# Patient Record
Sex: Female | Born: 1935 | Race: White | Hispanic: No | Smoking: Never smoker
Health system: Southern US, Community
[De-identification: ages and names within clinical notes are randomized; demographics above are authoritative.]

## PROBLEM LIST (undated history)

## (undated) DIAGNOSIS — I10 Essential (primary) hypertension: Secondary | ICD-10-CM

## (undated) DIAGNOSIS — C649 Malignant neoplasm of unspecified kidney, except renal pelvis: Secondary | ICD-10-CM

---

## 2020-05-11 ENCOUNTER — Ambulatory Visit: Payer: Medicaid Other | Attending: Family Medicine

## 2020-05-11 ENCOUNTER — Other Ambulatory Visit: Payer: Self-pay

## 2020-05-11 DIAGNOSIS — M25552 Pain in left hip: Secondary | ICD-10-CM | POA: Diagnosis present

## 2020-05-11 DIAGNOSIS — M25511 Pain in right shoulder: Secondary | ICD-10-CM | POA: Diagnosis present

## 2020-05-11 DIAGNOSIS — G8929 Other chronic pain: Secondary | ICD-10-CM | POA: Insufficient documentation

## 2020-05-11 DIAGNOSIS — M75101 Unspecified rotator cuff tear or rupture of right shoulder, not specified as traumatic: Secondary | ICD-10-CM | POA: Insufficient documentation

## 2020-05-13 NOTE — Therapy (Addendum)
[place], [place], [address] Phone: [phone]   Fax:  [phone]  Physical Therapy Evaluation  Patient Details  Name: Gail Fitzgerald MRN: [id number] Date of Birth: 1935/04/24 Referring Provider (PT): Gentry Fitz, MD   Encounter Date: 05/11/2020   PT End of Session - 05/12/20 2044    Visit Number 1    Number of Visits 4    Date for PT Re-Evaluation 06/30/20    Authorization Type [place] - Visit Number 0    Authorization - Number of Visits 3    PT Start Time 5456    PT Stop Time 1541    PT Time Calculation (min) 46 min    Activity Tolerance Patient tolerated treatment well    Behavior During Therapy [facility] for tasks assessed/performed           History reviewed. No pertinent past medical history.  History reviewed. No pertinent surgical history.  There were no vitals filed for this visit.    Subjective Assessment - 05/13/20 0832    Subjective Pt reports a 3 year Hx of R shoulder pain which has recently wrosened. Pt reports taking pain medication from [place] for her R shoulder which is helpful. Pt requested today's eval be for her R shoulder.    Limitations House hold activities;Lifting;Other (comment)   Reaching above shoulder level   How long can you sit comfortably? no issue    How long can you stand comfortably? no issue    How long can you walk comfortably? no issue    Patient Stated Goals To have less pain and use my R arm better with activites above shoulder height and gardening    Pain Onset Other (comment)   3 years ago             [facility] PT Assessment - 05/13/20 0001      Assessment   Medical Diagnosis R Rotator cuff syndrome with probable tear, L lateral hip pain     Referring Provider (PT) Gentry Fitz, MD    Onset Date/Surgical Date --   3 years, wrose recently   Hand Dominance Right    Prior Therapy No      Precautions    Precautions None      Restrictions   Weight Bearing Restrictions No      Balance Screen   Has the patient fallen in the past 6 months No    Has the patient had a decrease in activity level because of a fear of falling?  No    Is the patient reluctant to leave their home because of a fear of falling?  No      Home Environment   Living Environment Private residence    Living Arrangements Alone    Type of [place] to enter    Entrance Stairs-Number of Steps 40    Entrance Stairs-Rails Can reach both    [place] One level      Prior Function   Level of [place] Retired      Observation/Other Assessments   Focus on Therapeutic Outcomes (FOTO)  NA      Sensation   Light Touch Appears Intact      Posture/Postural Control   Posture/Postural Control Postural limitations    Postural Limitations Forward head;Rounded Shoulders      ROM / Strength   AROM /  PROM / Strength AROM;Strength      AROM   Overall AROM Comments R shoulder AROM is WNLs with pain at the end range of  flexion and abd      Strength   Overall Strength Comments R shoulder strength = 4 to 4+/5 c min increase in pain c ER and abd      Special Tests    Special Tests Rotator Cuff Impingement;Biceps/Labral Tests    Rotator Cuff Impingment tests Michel Bickers test;Empty Can test;Full Can test    Biceps/Labral tests Speeds Test      Hawkins-Kennedy test   Findings Negative    Side Right      Empty Can test   Findings Positive    Side Right    Comment Pain c min weakness      Full Can test   Findings Positive    Side Right    Comment pain s weakness      Clunk Test   Findings Negative    Side Right      Speeds test   findings Negative    Side Right      Transfers   Transfers Sit to Stand    Sit to Stand 7: Independent      Ambulation/Gait   Ambulation/Gait Yes    Ambulation/Gait Assistance 7: Independent    Gait Pattern Within  Functional Limits;Step-through pattern                      Objective measurements completed on examination: See above findings.               PT Education - 05/12/20 2038    Education Details Eval findings, POC, HEP, measures for pain reduction, sleep positions for comfort    Person(s) Educated Patient;Child(ren)    Methods Explanation;Demonstration;Tactile cues;Verbal cues;Handout    Comprehension Verbalized understanding;Returned demonstration;Verbal cues required;Tactile cues required;Need further instruction            PT Short Term Goals - 05/13/20 0817      PT SHORT TERM GOAL #1   Title Pt will be Ind in an initial HEP    Baseline Started on eval    Time 3    Period Weeks    Status New    Target Date 06/03/20      PT SHORT TERM GOAL #2   Title Pt will voice understanding of measures to assist in R shoulder pain redution and management    Time 3    Period Weeks    Status New    Target Date 06/03/20             PT Long Term Goals - 05/13/20 0821      PT LONG TERM GOAL #1   Title Pt will be Ind in a final HEP    Time 7    Period Weeks    Status New    Target Date 07/01/20      PT LONG TERM GOAL #2   Title Per MMT pt will demonstrate 4+/5 strength of the R shoulder    Baseline 4 to 4+/5    Time 7    Period Weeks    Status New    Target Date 07/01/20      PT LONG TERM GOAL #3   Title Pt will report completion of household actiities and/or gardening with use of the R UE with pain level not exceeding 3/10    Baseline 8/10  Time 7    Period Weeks    Status New    Target Date 07/01/20                  Plan - 05/13/20 0800    Clinical Impression Statement Pt presents with signs and symptoms of a R shoulder rotator cuff issue. Today, pain nor weakness were markedly reproduced and pt has good functional AROM. Pt will benefit from PT to reduce pain and increase strength of the R shoulder girdle complex to improve functional  use of the R UE. PT will assess L hip pain as indicated.    Personal Factors and Comorbidities Age;Time since onset of injury/illness/exacerbation    Examination-Activity Limitations Carry;Dressing;Lift;Sleep;Reach Overhead    Stability/Clinical Decision Making Stable/Uncomplicated    Clinical Decision Making Low    Rehab Potential Good    PT Frequency 2x / week    PT Duration 6 weeks    PT Treatment/Interventions Cryotherapy;Electrical Stimulation;Ultrasound;Moist Heat;Iontophoresis 4mg /ml Dexamethasone;Gait training;Functional mobility training;Stair training;Therapeutic activities;Therapeutic exercise;Balance training;Manual techniques;Patient/family education;Dry needling;Taping;Vasopneumatic Device    PT Next Visit Plan Assess response to HEP. Complete assessment for L hip pain as indicated    PT Home Exercise Plan [id number]    Consulted and Agree with Plan of Care Patient           Patient will benefit from skilled therapeutic intervention in order to improve the following deficits and impairments:  Decreased activity tolerance, Decreased knowledge of precautions, Decreased strength, Postural dysfunction, Pain  Visit Diagnosis: Rotator cuff syndrome of right shoulder  Chronic right shoulder pain  Lateral pain of left hip     Problem List There are no problems to display for this patient.   Gar Ponto MS, PT 05/13/20 8:34 AM  [facility] [address] [place], [place], [address] Phone: [phone]   Fax:  [phone]  Name: Gail Fitzgerald MRN: [id number] Date of Birth: 09/02/1935   Check all possible CPT codes:      [x]  97110 (Therapeutic Exercise)  []  92507 (SLP Treatment)  [x]  97112 (Neuro Re-ed)   []  92526 (Swallowing Treatment)   [x]  97116 (Gait Training)   []  [phone] (Cognitive Training, 1st 15 minutes) [x]  97140 (Manual Therapy)   []  97130 (Cognitive Training, each add'l 15 minutes)  [x]  97530 (Therapeutic  Activities)  []  Other, List CPT Code ____________    [x]  97535 (Self Care)       []  All codes above (97110 - 97535)  []  97012 (Mechanical Traction)  [x]  97014 (E-stim Unattended)  [x]  97032 (E-stim manual)  [x]  97033 (Ionto)  [x]  97035 (Ultrasound)  [x]  97016 (Vaso)  []  97760 (Orthotic Fit) []  [id number] (Prosthetic Training) []  [id number] (Physical Performance Training) []  [id number] (Aquatic Therapy) []  [id number] (Canalith Repositioning) []  [id number] (Contrast Bath) []  [id number] (Paraffin) []  97597 (Wound Care 1st 20 sq cm) []  97598 (Wound Care each add'l 20 sq cm)

## 2020-05-16 ENCOUNTER — Ambulatory Visit: Payer: Medicaid Other

## 2020-05-16 ENCOUNTER — Other Ambulatory Visit: Payer: Self-pay

## 2020-05-16 DIAGNOSIS — M25511 Pain in right shoulder: Secondary | ICD-10-CM

## 2020-05-16 DIAGNOSIS — M75101 Unspecified rotator cuff tear or rupture of right shoulder, not specified as traumatic: Secondary | ICD-10-CM | POA: Diagnosis not present

## 2020-05-16 DIAGNOSIS — M25552 Pain in left hip: Secondary | ICD-10-CM

## 2020-05-16 DIAGNOSIS — G8929 Other chronic pain: Secondary | ICD-10-CM

## 2020-05-16 NOTE — Therapy (Signed)
[place], [place], [address] Phone: [phone]   Fax:  [phone]  Physical Therapy Treatment  Patient Details  Name: Gail Fitzgerald MRN: [id number] Date of Birth: [birth year] Referring Provider (PT): Gentry Fitz, MD   Encounter Date: 05/16/2020   PT End of Session - 05/16/20 1327    Visit Number 2    Number of Visits 4    Date for PT Re-Evaluation 06/30/20    Authorization Type [place] - Visit Number 1    Authorization - Number of Visits 3    PT Start Time 1218    PT Stop Time 1303    PT Time Calculation (min) 45 min    Activity Tolerance Patient tolerated treatment well    Behavior During Therapy [facility] for tasks assessed/performed           History reviewed. No pertinent past medical history.  History reviewed. No pertinent surgical history.  There were no vitals filed for this visit.   Subjective Assessment - 05/16/20 1225    Subjective Pt reports no R shoulder today. Pt rates a 5/10 level of pain yesterday, but is not aure what caused the increase.    Currently in Pain? No/denies    Pain Location Shoulder    Pain Orientation Right;Lateral    Pain Descriptors / Indicators Aching    Pain Type Chronic pain    Pain Onset Other (comment)   3 years ago   Pain Frequency Intermittent                             [facility] Adult PT Treatment/Exercise - 05/16/20 0001      Exercises   Exercises Shoulder      Shoulder Exercises: Seated   Retraction AROM;Strengthening;Both;10 reps    Other Seated Exercises Seated anchored R GH jt distraction c trunk lean; 3x; 15 sce      Shoulder Exercises: Standing   Extension Strengthening;15 reps    Theraband Level (Shoulder Extension) Level 3 (Green)    Extension Limitations 2 sets    Retraction Strengthening;Both;15 reps    Theraband Level (Shoulder Retraction) Level 3 (Green)    Retraction  Limitations 2 sets      Shoulder Exercises: ROM/Strengthening   Pendulum r UE; 15x                  PT Education - 05/16/20 1326    Education Details HEP    Person(s) Educated Patient    Methods Explanation;Demonstration;Tactile cues;Verbal cues;Handout    Comprehension Verbalized understanding;Returned demonstration;Verbal cues required;Tactile cues required;Need further instruction            PT Short Term Goals - 05/13/20 0817      PT SHORT TERM GOAL #1   Title Pt will be Ind in an initial HEP    Baseline Started on eval    Time 3    Period Weeks    Status New    Target Date 06/03/20      PT SHORT TERM GOAL #2   Title Pt will voice understanding of measures to assist in R shoulder pain redution and management    Time 3    Period Weeks    Status New    Target Date 06/03/20             PT Long Term Goals - 05/13/20 6962  PT LONG TERM GOAL #1   Title Pt will be Ind in a final HEP    Time 7    Period Weeks    Status New    Target Date 07/01/20      PT LONG TERM GOAL #2   Title Per MMT pt will demonstrate 4+/5 strength of the R shoulder    Baseline 4 to 4+/5    Time 7    Period Weeks    Status New    Target Date 07/01/20      PT LONG TERM GOAL #3   Title Pt will report completion of household actiities and/or gardening with use of the R UE with pain level not exceeding 3/10    Baseline 8/10    Time 7    Period Weeks    Status New    Target Date 07/01/20                 Plan - 05/16/20 1329    Clinical Impression Statement Reveiwed HEP started on eval. Corrections in technique were made, and pt is completing properly. Exs for post. scapular chain strengthening were added to the pt's HEP. Initial HEP has been established.    Personal Factors and Comorbidities Age    Examination-Activity Limitations Carry;Dressing;Lift;Sleep;Reach Overhead    Stability/Clinical Decision Making Stable/Uncomplicated    Clinical Decision Making Low     Rehab Potential Good    PT Frequency 2x / week    PT Duration 6 weeks    PT Treatment/Interventions Cryotherapy;Electrical Stimulation;Ultrasound;Moist Heat;Iontophoresis 4mg /ml Dexamethasone;Gait training;Functional mobility training;Stair training;Therapeutic activities;Therapeutic exercise;Balance training;Manual techniques;Patient/family education;Dry needling;Taping;Vasopneumatic Device    PT Next Visit Plan Assess response to HEP. Complete assessment for L hip pain as indicated. Complete iontophoresis to the R shoulder as indicated.    PT Home Exercise Plan [id number]. Scapular retractions, pendulum, Sitting [place] jt distraction, scapular retraction with Tband, shoulder extension c IR with Tband.    Consulted and Agree with Plan of Care Patient;Family member/caregiver    Family Member Consulted DIL           Patient will benefit from skilled therapeutic intervention in order to improve the following deficits and impairments:  Decreased activity tolerance, Decreased knowledge of precautions, Decreased strength, Postural dysfunction, Pain  Visit Diagnosis: Rotator cuff syndrome of right shoulder  Chronic right shoulder pain  Lateral pain of left hip     Problem List There are no problems to display for this patient.  Gar Ponto MS, PT 05/16/20 1:44 PM   [facility] [address] [place], [place], [address] Phone: [phone]   Fax:  [phone]  Name: Gail Fitzgerald MRN: [id number] Date of Birth: [birth year]

## 2020-05-18 ENCOUNTER — Ambulatory Visit: Payer: Medicaid Other

## 2020-05-19 ENCOUNTER — Ambulatory Visit: Payer: Medicaid Other | Admitting: Physical Therapy

## 2020-05-19 ENCOUNTER — Other Ambulatory Visit: Payer: Self-pay

## 2020-05-19 ENCOUNTER — Encounter: Payer: Self-pay | Admitting: Physical Therapy

## 2020-05-19 DIAGNOSIS — M75101 Unspecified rotator cuff tear or rupture of right shoulder, not specified as traumatic: Secondary | ICD-10-CM | POA: Diagnosis not present

## 2020-05-19 DIAGNOSIS — G8929 Other chronic pain: Secondary | ICD-10-CM

## 2020-05-19 DIAGNOSIS — M25552 Pain in left hip: Secondary | ICD-10-CM

## 2020-05-19 NOTE — Therapy (Signed)
[place], [place], [address] Phone: [phone]   Fax:  [phone]  Physical Therapy Treatment  Patient Details  Name: Gail Fitzgerald MRN: [id number] Date of Birth: Feb 17, 1935 Referring Provider (PT): Gentry Fitz, MD   Encounter Date: 05/19/2020   PT End of Session - 05/19/20 1035    Visit Number 3    Number of Visits 4    Date for PT Re-Evaluation 06/30/20    Authorization Type [place] - Visit Number 2    Authorization - Number of Visits 3    PT Start Time [phone]    PT Stop Time 1015    PT Time Calculation (min) 44 min    Activity Tolerance Patient tolerated treatment well    Behavior During Therapy [facility] for tasks assessed/performed           History reviewed. No pertinent past medical history.  History reviewed. No pertinent surgical history.  There were no vitals filed for this visit.   Subjective Assessment - 05/19/20 1029    Subjective Some soreness with doing exercises (shoulder) and also noting some right upper trapezius region discomfort when raising right arm. Pt. wishes to continue focus on shoulder (rather than hip).    Patient is accompained by: Family member   granddaughter   Patient Stated Goals To have less pain and use my R arm better with activites above shoulder height and gardening                             [facility] Adult PT Treatment/Exercise - 05/19/20 0001      Shoulder Exercises: Supine   Horizontal ABduction AROM;Strengthening;Both;20 reps    Theraband Level (Shoulder Horizontal ABduction) Level 2 (Red)    Other Supine Exercises DB "punch" 2x10-started with 2 lbs. but too light per pt. so increased to 4 lbs.    Other Supine Exercises supine rhythmic stabilization at 90 deg flexion 20 sec x 3      Shoulder Exercises: Sidelying   External Rotation AROM;Strengthening;Right;15 reps    External Rotation Weight  (lbs) 1    ABduction AROM;Strengthening;Right;20 reps    ABduction Weight (lbs) 1    ABduction Limitations plane of scaption, cues for hand position and angle of ROM      Shoulder Exercises: Standing   Internal Rotation AROM;Strengthening;Right;15 reps    Theraband Level (Shoulder Internal Rotation) Level 3 (Green)    Flexion AROM;Strengthening;Right;20 reps    Shoulder Flexion Weight (lbs) 2    Flexion Limitations 2x10 flexion to 90 deg    Extension AROM;Strengthening;Both;20 reps    Theraband Level (Shoulder Extension) Level 3 (Green)    Retraction AROM;Strengthening;Both;20 reps    Theraband Level (Shoulder Retraction) Level 3 (Green)    Other Standing Exercises Theraband "punch" red band 2x10      Shoulder Exercises: Pulleys   Flexion 1 minute      Shoulder Exercises: Stretch   Other Shoulder Stretches seated right upper trapezius stretch 2 x 30 seconds      Manual Therapy   Manual Therapy Soft tissue mobilization    Soft tissue mobilization Trigger point STM right upper trapezius                  PT Education - 05/19/20 1034    Education Details exercises, body mechanics-if raising arm from side discussed/demo plane of scaption rather than abduction to  minimize impingement    Person(s) Educated Patient   granddaughter   Methods Verbal cues;Explanation;Demonstration    Comprehension Verbalized understanding;Returned demonstration            PT Short Term Goals - 05/13/20 0817      PT SHORT TERM GOAL #1   Title Pt will be Ind in an initial HEP    Baseline Started on eval    Time 3    Period Weeks    Status New    Target Date 06/03/20      PT SHORT TERM GOAL #2   Title Pt will voice understanding of measures to assist in R shoulder pain redution and management    Time 3    Period Weeks    Status New    Target Date 06/03/20             PT Long Term Goals - 05/13/20 0821      PT LONG TERM GOAL #1   Title Pt will be Ind in a final HEP    Time 7     Period Weeks    Status New    Target Date 07/01/20      PT LONG TERM GOAL #2   Title Per MMT pt will demonstrate 4+/5 strength of the R shoulder    Baseline 4 to 4+/5    Time 7    Period Weeks    Status New    Target Date 07/01/20      PT LONG TERM GOAL #3   Title Pt will report completion of household actiities and/or gardening with use of the R UE with pain level not exceeding 3/10    Baseline 8/10    Time 7    Period Weeks    Status New    Target Date 07/01/20                 Plan - 05/19/20 1036    Clinical Impression Statement Tx. session focus for continued right shoulder strengthening-still with pain in abduction but tolerated as noted per flowsheet with emphasis on scaption plane-overall exercise progression was well-tolerated. Discussed ionto but pt. declined so more focus exercises and also included brief manual to right upper trapezius region (suspect tightness/discomfort this area associated with compensatory use with shoulder motion). Pt. would benefit from continued therapy for further progression to improve functional status for reaching ability with RUE and address associated functional limitations.    Personal Factors and Comorbidities Age    Examination-Activity Limitations Carry;Dressing;Lift;Sleep;Reach Overhead    Stability/Clinical Decision Making Stable/Uncomplicated    Clinical Decision Making Low    Rehab Potential Good    PT Frequency 2x / week    PT Duration 6 weeks    PT Treatment/Interventions Cryotherapy;Electrical Stimulation;Ultrasound;Moist Heat;Iontophoresis 4mg /ml Dexamethasone;Gait training;Functional mobility training;Stair training;Therapeutic activities;Therapeutic exercise;Balance training;Manual techniques;Patient/family education;Dry needling;Taping;Vasopneumatic Device    PT Next Visit Plan shoulder vs. hip focus pending pt. preference, continue emphasis shoulder strengthening and stabilization as tolerated, update/progess HEP as  needed    PT Home Exercise Plan [id number]. Scapular retractions, pendulum, Sitting [place] jt distraction, scapular retraction with Tband, shoulder extension c IR with Tband.    Consulted and Agree with Plan of Care Patient;Family member/caregiver           Patient will benefit from skilled therapeutic intervention in order to improve the following deficits and impairments:  Decreased activity tolerance, Decreased knowledge of precautions, Decreased strength, Postural dysfunction, Pain  Visit Diagnosis: Rotator cuff syndrome of  right shoulder  Chronic right shoulder pain  Lateral pain of left hip     Problem List There are no problems to display for this patient.   [name], PT, DPT 05/19/20 10:40 AM  [facility] [address] [place], [place], [address] Phone: [phone]   Fax:  [phone]  Name: Gail Fitzgerald MRN: [id number] Date of Birth: [birth year]

## 2020-05-24 ENCOUNTER — Ambulatory Visit: Payer: Medicaid Other | Admitting: Physical Therapy

## 2020-05-24 ENCOUNTER — Encounter: Payer: Self-pay | Admitting: Physical Therapy

## 2020-05-24 ENCOUNTER — Other Ambulatory Visit: Payer: Self-pay

## 2020-05-24 DIAGNOSIS — M25552 Pain in left hip: Secondary | ICD-10-CM

## 2020-05-24 DIAGNOSIS — G8929 Other chronic pain: Secondary | ICD-10-CM

## 2020-05-24 DIAGNOSIS — M75101 Unspecified rotator cuff tear or rupture of right shoulder, not specified as traumatic: Secondary | ICD-10-CM | POA: Diagnosis not present

## 2020-05-24 NOTE — Therapy (Signed)
[place], [place], [address] Phone: [phone]   Fax:  [phone]  Physical Therapy Treatment  Patient Details  Name: Gail Fitzgerald MRN: [id number] Date of Birth: 01/30/1935 Referring Provider (PT): Gentry Fitz, MD   Encounter Date: 05/24/2020   PT End of Session - 05/24/20 1715    Visit Number 4    Number of Visits 13    Date for PT Re-Evaluation 06/30/20    Authorization Type [place] MEDICAID HEALTHY BLUE    Authorization Time Period 05/16/20-07/04/20    Authorization - Visit Number 3    Authorization - Number of Visits 12    PT Start Time 4268    PT Stop Time 1500    PT Time Calculation (min) 47 min    Activity Tolerance Patient tolerated treatment well    Behavior During Therapy [facility] for tasks assessed/performed           History reviewed. No pertinent past medical history.  History reviewed. No pertinent surgical history.  There were no vitals filed for this visit.   Subjective Assessment - 05/24/20 1414    Subjective Shoulder feels a little better since last visit. Shoulder pain is intermittent/does not hurt all the time but can still increase as high as 8-9/10 briefly with activity. Pt. wishes to address left hip region pain today as well.    Patient is accompained by: Family member   daughter-in-law   Limitations House hold activities;Lifting;Other (comment)    Patient Stated Goals To have less pain and use my R arm better with activites above shoulder height and gardening    Currently in Pain? No/denies              [facility] PT Assessment - 05/24/20 0001      ROM / Strength   AROM / PROM / Strength AROM      AROM   AROM Assessment Site Shoulder    Right/Left Shoulder Right    Right Shoulder Flexion 160 Degrees    Right Shoulder ABduction 150 Degrees    Right Shoulder Internal Rotation --   reach to T9   Right Shoulder External Rotation --   reach to T2     Strength    Strength Assessment Site Shoulder;Hip    Right/Left Shoulder Right    Right Shoulder Flexion 5/5    Right Shoulder ABduction 4+/5    Right Shoulder Internal Rotation 5/5    Right Shoulder External Rotation 4+/5    Right/Left Hip Left    Right Hip Flexion 4+/5    Right Hip Extension 4+/5    Right Hip External Rotation  4+/5    Right Hip Internal Rotation 4+/5    Right Hip ABduction 4+/5    Right Hip ADduction 5/5      Palpation   Palpation comment tender to palpation left greater trochanteri and gluteus medius region                         [facility] Adult PT Treatment/Exercise - 05/24/20 0001      Exercises   Exercises Knee/Hip      Knee/Hip Exercises: Stretches   ITB Stretch Left;3 reps;30 seconds    Piriformis Stretch Left;3 reps;30 seconds    Other Knee/Hip Stretches Brief HEP instruction standing TFL stretch      Knee/Hip Exercises: Standing   Hip Abduction AROM;Stengthening;Left;2 sets;10 reps;Knee straight    Abduction Limitations Green Theraband proximal to  knees      Knee/Hip Exercises: Supine   Other Supine Knee/Hip Exercises clamshell green band 2x10      Shoulder Exercises: Supine   Horizontal ABduction AROM;Strengthening;Both;20 reps    Theraband Level (Shoulder Horizontal ABduction) Level 3 (Green)    Other Supine Exercises DB "punch". 2x10    Other Supine Exercises supine rhythmic stabilization at 90 deg flexion 20 sec x 3      Shoulder Exercises: Sidelying   External Rotation AROM;Strengthening;Right;20 reps    External Rotation Weight (lbs) 2    ABduction AROM;Strengthening;Right;20 reps    ABduction Limitations plane of scaption, cues for hand position and angle of ROM      Shoulder Exercises: Standing   Internal Rotation AROM;Strengthening;Right;20 reps    Theraband Level (Shoulder Internal Rotation) Level 3 (Green)    Flexion AROM;Strengthening;Right;20 reps    Shoulder Flexion Weight (lbs) 3    Flexion Limitations 2x10 flexion to 90  deg    Extension AROM;Strengthening;Right;20 reps    Theraband Level (Shoulder Extension) Level 3 (Green)    Retraction AROM;Strengthening;Both;20 reps    Theraband Level (Shoulder Retraction) Level 4 (Blue)    Other Standing Exercises Theraband "punch" green band 2x10      Manual Therapy   Soft tissue mobilization STM/IASTM with roller left gluteus medius, TFL and piriformis                  PT Education - 05/24/20 1530    Education Details HEP updates, exercises    Person(s) Educated Patient;Other (comment)   daughter in law   Methods Explanation;Demonstration;Tactile cues;Verbal cues;Handout    Comprehension Returned demonstration;Verbalized understanding            PT Short Term Goals - 05/13/20 0817      PT SHORT TERM GOAL #1   Title Pt will be Ind in an initial HEP    Baseline Started on eval    Time 3    Period Weeks    Status New    Target Date 06/03/20      PT SHORT TERM GOAL #2   Title Pt will voice understanding of measures to assist in R shoulder pain redution and management    Time 3    Period Weeks    Status New    Target Date 06/03/20             PT Long Term Goals - 05/13/20 0821      PT LONG TERM GOAL #1   Title Pt will be Ind in a final HEP    Time 7    Period Weeks    Status New    Target Date 07/01/20      PT LONG TERM GOAL #2   Title Per MMT pt will demonstrate 4+/5 strength of the R shoulder    Baseline 4 to 4+/5    Time 7    Period Weeks    Status New    Target Date 07/01/20      PT LONG TERM GOAL #3   Title Pt will report completion of household actiities and/or gardening with use of the R UE with pain level not exceeding 3/10    Baseline 8/10    Time 7    Period Weeks    Status New    Target Date 07/01/20                 Plan - 05/24/20 1454    Clinical Impression Statement Shoulder: Pt.  is progressing well from baseline status with improving right shoulder strength-still with some pain in abduction  consistent with lilekly impingement/potential underlying cuff tear but improving with less pain from baseline status. Hip: findings consistent with trochanteric bursitis vs. abductor tendinopathy/contributing glut med region myofascial pain with some associated hip weakness in abduction and hip rotator muscle tightness. Pt. would benefit from continued therapy for further progress to address remaining fnuctional limitations for both regions.    Personal Factors and Comorbidities Age    Examination-Activity Limitations Carry;Dressing;Lift;Sleep;Reach Overhead    Stability/Clinical Decision Making Stable/Uncomplicated    Clinical Decision Making Low    Rehab Potential Good    PT Frequency 2x / week    PT Duration 6 weeks    PT Treatment/Interventions Cryotherapy;Electrical Stimulation;Ultrasound;Moist Heat;Iontophoresis 4mg /ml Dexamethasone;Gait training;Functional mobility training;Stair training;Therapeutic activities;Therapeutic exercise;Balance training;Manual techniques;Patient/family education;Dry needling;Taping;Vasopneumatic Device    PT Next Visit Plan Continue shoulder strengthening progression as tolerated, hp abductor strengthening/stretches/manual for hip, modalities prn    PT Home Exercise Plan [id number]. Scapular retractions, pendulum, Sitting [place] jt distraction, scapular retraction with Tband, shoulder extension c IR with Tband, TFL stretch in standing, hip abd SLR in standing with Theraband, clamshell    Consulted and Agree with Plan of Care Patient;Family member/caregiver    Family Member Consulted DIL           Patient will benefit from skilled therapeutic intervention in order to improve the following deficits and impairments:  Decreased activity tolerance, Decreased knowledge of precautions, Decreased strength, Postural dysfunction, Pain  Visit Diagnosis: Rotator cuff syndrome of right shoulder  Chronic right shoulder pain  Lateral pain of left hip     Problem  List There are no problems to display for this patient.   [name], PT, DPT 05/24/20 5:16 PM  [facility] [address] [place], [place], [address] Phone: [phone]   Fax:  [phone]  Name: Gail Fitzgerald MRN: [id number] Date of Birth: 1935-07-23

## 2020-05-26 ENCOUNTER — Ambulatory Visit: Payer: Medicaid Other

## 2020-05-26 ENCOUNTER — Other Ambulatory Visit: Payer: Self-pay

## 2020-05-26 DIAGNOSIS — M75101 Unspecified rotator cuff tear or rupture of right shoulder, not specified as traumatic: Secondary | ICD-10-CM

## 2020-05-26 DIAGNOSIS — G8929 Other chronic pain: Secondary | ICD-10-CM

## 2020-05-26 DIAGNOSIS — M25552 Pain in left hip: Secondary | ICD-10-CM

## 2020-05-27 NOTE — Therapy (Signed)
[place], [place], [address] Phone: [phone]   Fax:  [phone]  Physical Therapy Treatment  Patient Details  Name: Gail Fitzgerald MRN: [id number] Date of Birth: [birth year] Referring Provider (PT): Gentry Fitz, MD   Encounter Date: 05/26/2020   PT End of Session - 05/27/20 1227    Visit Number 5    Date for PT Re-Evaluation 06/30/20    Authorization Type [place] MEDICAID HEALTHY BLUE    Authorization Time Period 05/16/20-07/04/20    Authorization - Visit Number 4    Authorization - Number of Visits 12    PT Start Time 5427    PT Stop Time 0918    PT Time Calculation (min) 41 min    Activity Tolerance Patient tolerated treatment well    Behavior During Therapy [facility] for tasks assessed/performed           History reviewed. No pertinent past medical history.  History reviewed. No pertinent surgical history.  There were no vitals filed for this visit.   Subjective Assessment - 05/26/20 0844    Subjective Pt reports her R shoulder and her L hip are not hurting. Pt states the L hip bothers when walking and when she sleeps on it.    Patient is accompained by: Family member    Currently in Pain? No/denies    Pain Score 0-No pain    Pain Location Abdomen    Pain Orientation Right    Pain Descriptors / Indicators Aching    Pain Type Chronic pain    Pain Onset Other (comment)   3 years ago                            [facility] Adult PT Treatment/Exercise - 05/27/20 0001      Exercises   Exercises Knee/Hip      Knee/Hip Exercises: Stretches   ITB Stretch Left;3 reps;30 seconds    Piriformis Stretch Left;3 reps;30 seconds    Piriformis Stretch Limitations Seated      Knee/Hip Exercises: Standing   Hip Abduction AROM;Stengthening;2 sets;10 reps;Knee straight;Right;Left    Abduction Limitations Green Theraband proximal to knees      Knee/Hip Exercises: Supine   Hip Adduction  Isometric Strengthening;Both;2 sets;10 reps    Hip Adduction Isometric Limitations Ball squeeze    Other Supine Knee/Hip Exercises clamshell green band 2x10                  PT Education - 05/27/20 1226    Education Details HEP. Review of hip flexibilty and strengthening exs    Person(s) Educated Patient;Other (comment)   DIL   Methods Explanation;Demonstration;Tactile cues;Verbal cues;Handout    Comprehension Verbalized understanding;Returned demonstration;Verbal cues required;Tactile cues required;Need further instruction            PT Short Term Goals - 05/13/20 0817      PT SHORT TERM GOAL #1   Title Pt will be Ind in an initial HEP    Baseline Started on eval    Time 3    Period Weeks    Status New    Target Date 06/03/20      PT SHORT TERM GOAL #2   Title Pt will voice understanding of measures to assist in R shoulder pain redution and management    Time 3    Period Weeks    Status New    Target Date 06/03/20  PT Long Term Goals - 05/13/20 1610      PT LONG TERM GOAL #1   Title Pt will be Ind in a final HEP    Time 7    Period Weeks    Status New    Target Date 07/01/20      PT LONG TERM GOAL #2   Title Per MMT pt will demonstrate 4+/5 strength of the R shoulder    Baseline 4 to 4+/5    Time 7    Period Weeks    Status New    Target Date 07/01/20      PT LONG TERM GOAL #3   Title Pt will report completion of household actiities and/or gardening with use of the R UE with pain level not exceeding 3/10    Baseline 8/10    Time 7    Period Weeks    Status New    Target Date 07/01/20                 Plan - 05/27/20 1228    Clinical Impression Statement PT focused on ther ex/HEP for L hip flexibilty and strengthening exs. Pt presents to today's session reporting neither R shoulder or L hip pain.    Personal Factors and Comorbidities Age    Examination-Activity Limitations Carry;Dressing;Lift;Sleep;Reach Overhead     Stability/Clinical Decision Making Stable/Uncomplicated    Clinical Decision Making Low    Rehab Potential Good    PT Frequency 2x / week    PT Duration 6 weeks    PT Treatment/Interventions Cryotherapy;Electrical Stimulation;Ultrasound;Moist Heat;Iontophoresis 4mg /ml Dexamethasone;Gait training;Functional mobility training;Stair training;Therapeutic activities;Therapeutic exercise;Balance training;Manual techniques;Patient/family education;Dry needling;Taping;Vasopneumatic Device    PT Next Visit Plan Assess response to HEP for L hip. Continue shoulder strengthening progression as tolerated, hip abductor strengthening/stretches/manual for hip, modalities prn    PT Home Exercise Plan [id number]. Scapular retractions, pendulum, Sitting [place] jt distraction, scapular retraction with Tband, shoulder extension c IR with Tband, TFL stretch in standing, hip abd SLR in standing with Theraband, clamshell, hip add isometric c ball in supine.    Consulted and Agree with Plan of Care Patient;Family member/caregiver    Family Member Consulted DIL           Patient will benefit from skilled therapeutic intervention in order to improve the following deficits and impairments:  Decreased activity tolerance, Decreased knowledge of precautions, Decreased strength, Postural dysfunction, Pain  Visit Diagnosis: Rotator cuff syndrome of right shoulder  Chronic right shoulder pain  Lateral pain of left hip     Problem List There are no problems to display for this patient.   Gar Ponto MS, PT 05/27/20 12:35 PM  [facility] [address] [place], [place], [address] Phone: [phone]   Fax:  [phone]  Name: Gail Fitzgerald MRN: [id number] Date of Birth: December 03, 1934

## 2020-05-30 ENCOUNTER — Other Ambulatory Visit: Payer: Self-pay

## 2020-05-30 ENCOUNTER — Ambulatory Visit: Payer: Medicaid Other

## 2020-05-30 DIAGNOSIS — M75101 Unspecified rotator cuff tear or rupture of right shoulder, not specified as traumatic: Secondary | ICD-10-CM | POA: Diagnosis not present

## 2020-05-30 DIAGNOSIS — G8929 Other chronic pain: Secondary | ICD-10-CM

## 2020-05-30 DIAGNOSIS — M25552 Pain in left hip: Secondary | ICD-10-CM

## 2020-05-31 NOTE — Therapy (Signed)
[place], [place], [address] Phone: [phone]   Fax:  [phone]  Physical Therapy Treatment  Patient Details  Name: Gail Fitzgerald MRN: [id number] Date of Birth: 1934-11-30 Referring Provider (PT): Gentry Fitz, MD   Encounter Date: 05/30/2020   PT End of Session - 05/31/20 0641    Visit Number 6    Number of Visits 13    Date for PT Re-Evaluation 06/30/20    Authorization Type [place] MEDICAID HEALTHY BLUE    Authorization Time Period 05/16/20-07/04/20    Authorization - Visit Number 5    Authorization - Number of Visits 12    PT Start Time 2585    PT Stop Time 1622    PT Time Calculation (min) 43 min    Activity Tolerance Patient tolerated treatment well    Behavior During Therapy [facility] for tasks assessed/performed           History reviewed. No pertinent past medical history.  History reviewed. No pertinent surgical history.  There were no vitals filed for this visit.   Subjective Assessment - 05/30/20 1544    Subjective Pt reports R shoulder pain only c quick shoulder movements. Pt reports walking 2 miles yesterday and had R hip pain last night at 5/10, but has no pain today.    Patient Stated Goals To have less pain and use my R arm better with activites above shoulder height and gardening    Pain Score 0-No pain    Pain Location Shoulder    Pain Orientation Right    Pain Descriptors / Indicators Aching    Pain Type Chronic pain    Pain Onset Other (comment)   3 years ago   Pain Frequency Intermittent    Aggravating Factors  Over Sh activities    Pain Relieving Factors Rest    Effect of Pain on Daily Activities Low impact                             [facility] Adult PT Treatment/Exercise - 05/31/20 0001      Exercises   Exercises Knee/Hip;Shoulder      Knee/Hip Exercises: Stretches   ITB Stretch Left;3 reps;30 seconds    Piriformis Stretch Left;3 reps;30  seconds    Piriformis Stretch Limitations Seated      Knee/Hip Exercises: Standing   Hip Abduction AROM;Stengthening;2 sets;10 reps;Knee straight;Right;Left    Abduction Limitations Green Theraband proximal to knees      Knee/Hip Exercises: Supine   Hip Adduction Isometric Strengthening;Both;2 sets;10 reps    Hip Adduction Isometric Limitations Ball squeeze      Shoulder Exercises: Supine   Protraction Strengthening;Both;15 reps    Protraction Weight (lbs) 4 lbs    Horizontal ABduction AROM;Strengthening;Both;15 reps    Theraband Level (Shoulder Horizontal ABduction) Level 3 (Green)    External Rotation Strengthening;Both;15 reps    Theraband Level (Shoulder External Rotation) Level 4 (Blue)                    PT Short Term Goals - 05/13/20 0817      PT SHORT TERM GOAL #1   Title Pt will be Ind in an initial HEP    Baseline Started on eval    Time 3    Period Weeks    Status New    Target Date 06/03/20      PT SHORT TERM GOAL #2  Title Pt will voice understanding of measures to assist in R shoulder pain redution and management    Time 3    Period Weeks    Status New    Target Date 06/03/20             PT Long Term Goals - 05/13/20 0821      PT LONG TERM GOAL #1   Title Pt will be Ind in a final HEP    Time 7    Period Weeks    Status New    Target Date 07/01/20      PT LONG TERM GOAL #2   Title Per MMT pt will demonstrate 4+/5 strength of the R shoulder    Baseline 4 to 4+/5    Time 7    Period Weeks    Status New    Target Date 07/01/20      PT LONG TERM GOAL #3   Title Pt will report completion of household actiities and/or gardening with use of the R UE with pain level not exceeding 3/10    Baseline 8/10    Time 7    Period Weeks    Status New    Target Date 07/01/20                 Plan - 05/31/20 3557    Clinical Impression Statement PT focused on R GH and scapular strengthening and flexibility and strengthening of the L  hip. Per pain reports from pt, the intensity of pain and frequency are improving.    Personal Factors and Comorbidities Age    Examination-Activity Limitations Carry;Dressing;Lift;Sleep;Reach Overhead    Stability/Clinical Decision Making Stable/Uncomplicated    Clinical Decision Making Low    Rehab Potential Good    PT Frequency 2x / week    PT Treatment/Interventions Cryotherapy;Electrical Stimulation;Ultrasound;Moist Heat;Iontophoresis 4mg /ml Dexamethasone;Gait training;Functional mobility training;Stair training;Therapeutic activities;Therapeutic exercise;Balance training;Manual techniques;Patient/family education;Dry needling;Taping;Vasopneumatic Device    PT Next Visit Plan Assess response to HEP for L hip. Continue shoulder strengthening progression as tolerated, hip abductor strengthening/stretches/manual for hip, modalities prn    PT Home Exercise Plan [id number]. Scapular retractions, pendulum, Sitting [place] jt distraction, scapular retraction with Tband, shoulder extension c IR with Tband, TFL stretch in standing, hip abd SLR in standing with Theraband, clamshell, hip add isometric c ball in supine.    Consulted and Agree with Plan of Care Patient;Family member/caregiver    Family Member Consulted DIL           Patient will benefit from skilled therapeutic intervention in order to improve the following deficits and impairments:  Decreased activity tolerance, Decreased knowledge of precautions, Decreased strength, Postural dysfunction, Pain  Visit Diagnosis: Rotator cuff syndrome of right shoulder  Chronic right shoulder pain  Lateral pain of left hip     Problem List There are no problems to display for this patient.  Gar Ponto MS, PT 05/31/20 6:50 AM  [facility] [address] [place], [place], [address] Phone: [phone]   Fax:  [phone]  Name: Gail Fitzgerald MRN: [id number] Date of Birth: Jun 02, 1935

## 2020-06-01 ENCOUNTER — Other Ambulatory Visit: Payer: Self-pay

## 2020-06-01 ENCOUNTER — Ambulatory Visit: Payer: Medicaid Other

## 2020-06-01 DIAGNOSIS — G8929 Other chronic pain: Secondary | ICD-10-CM

## 2020-06-01 DIAGNOSIS — M25511 Pain in right shoulder: Secondary | ICD-10-CM

## 2020-06-01 DIAGNOSIS — M25552 Pain in left hip: Secondary | ICD-10-CM

## 2020-06-01 DIAGNOSIS — M75101 Unspecified rotator cuff tear or rupture of right shoulder, not specified as traumatic: Secondary | ICD-10-CM

## 2020-06-02 NOTE — Therapy (Signed)
[place], [place], [address] Phone: [phone]   Fax:  [phone]  Physical Therapy Treatment  Patient Details  Name: Gail Fitzgerald MRN: [id number] Date of Birth: 1935-08-30 Referring Provider (PT): Gentry Fitz, MD   Encounter Date: 06/01/2020   PT End of Session - 06/02/20 2016    Visit Number 7    Number of Visits 13    Date for PT Re-Evaluation 06/30/20    Authorization Time Period 05/16/20-07/04/20    Authorization - Visit Number 6    Authorization - Number of Visits 12    PT Start Time 7782    PT Stop Time 1130    PT Time Calculation (min) 42 min    Activity Tolerance Patient tolerated treatment well    Behavior During Therapy [facility] for tasks assessed/performed           History reviewed. No pertinent past medical history.  History reviewed. No pertinent surgical history.  There were no vitals filed for this visit.                      [facility] Adult PT Treatment/Exercise - 06/02/20 0001      Exercises   Exercises Knee/Hip;Shoulder      Knee/Hip Exercises: Supine   Hip Adduction Isometric Strengthening;Both;10 reps    Hip Adduction Isometric Limitations Ball squeeze 3 sec    Bridges with [employer] Strengthening;Both;10 reps    Other Supine Knee/Hip Exercises clamshell green band 2x10      Shoulder Exercises: Supine   Protraction Strengthening;Both;10 reps    Protraction Weight (lbs) 4 lbs      Shoulder Exercises: Seated   Horizontal ABduction Strengthening;Both;10 reps    Theraband Level (Shoulder Horizontal ABduction) Level 3 (Green)    Horizontal ABduction Limitations 2 sets    External Rotation Strengthening;Both;10 reps    Theraband Level (Shoulder External Rotation) Level 3 (Green)    External Rotation Limitations 2 sets      Shoulder Exercises: Sidelying   External Rotation Strengthening;Right;10 reps    External Rotation Weight (lbs) 4     External Rotation Limitations 2 sets                  PT Education - 06/02/20 2014    Education Details pt stated she was playing to take an hour walk this afternood. Advised pt to walk for just a .5 hour and assess how her L hip resonds.    Person(s) Educated Patient    Methods Explanation    Comprehension Verbalized understanding            PT Short Term Goals - 05/13/20 0817      PT SHORT TERM GOAL #1   Title Pt will be Ind in an initial HEP    Baseline Started on eval    Time 3    Period Weeks    Status New    Target Date 06/03/20      PT SHORT TERM GOAL #2   Title Pt will voice understanding of measures to assist in R shoulder pain redution and management    Time 3    Period Weeks    Status New    Target Date 06/03/20             PT Long Term Goals - 05/13/20 0821      PT LONG TERM GOAL #1   Title Pt will be Ind in a final  HEP    Time 7    Period Weeks    Status New    Target Date 07/01/20      PT LONG TERM GOAL #2   Title Per MMT pt will demonstrate 4+/5 strength of the R shoulder    Baseline 4 to 4+/5    Time 7    Period Weeks    Status New    Target Date 07/01/20      PT LONG TERM GOAL #3   Title Pt will report completion of household actiities and/or gardening with use of the R UE with pain level not exceeding 3/10    Baseline 8/10    Time 7    Period Weeks    Status New    Target Date 07/01/20                 Plan - 06/02/20 2020    Clinical Impression Statement Pt is responding well to the PT program to address external rotator cuff and scapular strengthening for what appears to be  R sh tendinopathy, and L hip flexibility and hip abd and strengthening. Pain level and frequency for both areas are improved.    Personal Factors and Comorbidities Age    Examination-Activity Limitations Carry;Dressing;Lift;Sleep;Reach Overhead    Stability/Clinical Decision Making Stable/Uncomplicated    Rehab Potential Good    PT Frequency 2x  / week    PT Duration 6 weeks    PT Treatment/Interventions Cryotherapy;Electrical Stimulation;Ultrasound;Moist Heat;Iontophoresis 4mg /ml Dexamethasone;Gait training;Functional mobility training;Stair training;Therapeutic activities;Therapeutic exercise;Balance training;Manual techniques;Patient/family education;Dry needling;Taping;Vasopneumatic Device    PT Next Visit Plan Assess response to HEP for L hip. Continue shoulder strengthening progression as tolerated, hip abductor strengthening/stretches/manual for hip, modalities prn    PT Home Exercise Plan [id number]. Scapular retractions, pendulum, Sitting [place] jt distraction, scapular retraction with Tband, shoulder extension c IR with Tband, TFL stretch in standing, hip abd SLR in standing with Theraband, clamshell, hip add isometric c ball in supine.    Consulted and Agree with Plan of Care Patient;Other (Comment)   language interpreter          Patient will benefit from skilled therapeutic intervention in order to improve the following deficits and impairments:  Decreased activity tolerance, Decreased knowledge of precautions, Decreased strength, Postural dysfunction, Pain  Visit Diagnosis: Rotator cuff syndrome of right shoulder  Chronic right shoulder pain  Lateral pain of left hip     Problem List There are no problems to display for this patient.   Gar Ponto MS, PT 06/02/20 8:36 PM  [facility] [address] [place], [place], [address] Phone: [phone]   Fax:  [phone]  Name: Gail Fitzgerald MRN: [id number] Date of Birth: February 12, 1935

## 2020-06-06 ENCOUNTER — Other Ambulatory Visit: Payer: Self-pay

## 2020-06-06 ENCOUNTER — Ambulatory Visit: Payer: Medicaid Other

## 2020-06-06 DIAGNOSIS — G8929 Other chronic pain: Secondary | ICD-10-CM

## 2020-06-06 DIAGNOSIS — M75101 Unspecified rotator cuff tear or rupture of right shoulder, not specified as traumatic: Secondary | ICD-10-CM

## 2020-06-06 DIAGNOSIS — M25552 Pain in left hip: Secondary | ICD-10-CM

## 2020-06-07 NOTE — Therapy (Signed)
[place], [place], [address] Phone: [phone]   Fax:  [phone]  Physical Therapy Treatment  Patient Details  Name: Gail Fitzgerald MRN: [id number] Date of Birth: 06-25-1935 Referring Provider (PT): Gentry Fitz, MD   Encounter Date: 06/06/2020   PT End of Session - 06/06/20 1913    Visit Number 8    Number of Visits 13    Date for PT Re-Evaluation 06/30/20    Authorization Type [place] MEDICAID HEALTHY BLUE    Authorization Time Period 05/16/20-07/04/20    Authorization - Visit Number 7    Authorization - Number of Visits 12    PT Start Time 1838    PT Stop Time 1922    PT Time Calculation (min) 44 min           History reviewed. No pertinent past medical history.  History reviewed. No pertinent surgical history.  There were no vitals filed for this visit.   Subjective Assessment - 06/07/20 0733    Subjective Pt. reports her L hip bothers her some when taking a longer walk for ex, about a 3/10. Pt reports her R shoulder has not been hurting    Patient is accompained by: Interpreter   Gail Fitzgerald   Patient Stated Goals To have less pain and use my R arm better with activites above shoulder height and gardening    Currently in Pain? No/denies    Pain Score 3    when walking for ex   Pain Location Hip    Pain Orientation Left;Lateral    Pain Descriptors / Indicators Aching    Pain Type Chronic pain    Pain Onset Other (comment)   3 years   Pain Frequency Intermittent                             [facility] Adult PT Treatment/Exercise - 06/07/20 0001      Exercises   Exercises Knee/Hip;Shoulder      Knee/Hip Exercises: Stretches   Active Hamstring Stretch Left;2 reps;20 seconds;30 seconds    ITB Stretch Left;3 reps;30 seconds    Piriformis Stretch Left;3 reps;30 seconds    Piriformis Stretch Limitations Seated      Knee/Hip Exercises: Standing   Hip Abduction  AROM;Stengthening;2 sets;10 reps;Knee straight;Right;Left    Abduction Limitations Green Theraband proximal to knees      Knee/Hip Exercises: Supine   Hip Adduction Isometric Strengthening;Both;10 reps    Hip Adduction Isometric Limitations Ball squeeze 3 sec    Bridges with [employer] Strengthening;Both;10 reps      Knee/Hip Exercises: Sidelying   Clams 10x; 3 sets; GTB      Manual Therapy   Manual Therapy Soft tissue mobilization    Soft tissue mobilization IASTM c foam roller to lateral and posterior hip musculature                  PT Education - 06/07/20 0741    Education Details updated HEP c hamstring and a hip flexor stretch    Person(s) Educated Patient    Methods Explanation;Tactile cues;Demonstration;Verbal cues;Handout    Comprehension Verbalized understanding;Returned demonstration;Verbal cues required;Tactile cues required            PT Short Term Goals - 05/13/20 0817      PT SHORT TERM GOAL #1   Title Pt will be Ind in an initial HEP    Baseline Started on eval  Time 3    Period Weeks    Status New    Target Date 06/03/20      PT SHORT TERM GOAL #2   Title Pt will voice understanding of measures to assist in R shoulder pain redution and management    Time 3    Period Weeks    Status New    Target Date 06/03/20             PT Long Term Goals - 05/13/20 0821      PT LONG TERM GOAL #1   Title Pt will be Ind in a final HEP    Time 7    Period Weeks    Status New    Target Date 07/01/20      PT LONG TERM GOAL #2   Title Per MMT pt will demonstrate 4+/5 strength of the R shoulder    Baseline 4 to 4+/5    Time 7    Period Weeks    Status New    Target Date 07/01/20      PT LONG TERM GOAL #3   Title Pt will report completion of household actiities and/or gardening with use of the R UE with pain level not exceeding 3/10    Baseline 8/10    Time 7    Period Weeks    Status New    Target Date 07/01/20                  Plan - 06/07/20 7793    Clinical Impression Statement PT was provided to address L hip pain, associated c trochanteric bursitis vs. abductor tendinopathy, through strengthening and flexibility exs.Hamstring and hip flexor stretches were added today. Pt's L hip pain is improving with it primarily pain free, bothering the pt when she is taking an extended walk 30+ mins. for exercise.    Personal Factors and Comorbidities Age    Examination-Activity Limitations Carry;Dressing;Lift;Sleep;Reach Overhead    Stability/Clinical Decision Making Stable/Uncomplicated    Clinical Decision Making Low    Rehab Potential Good    PT Frequency 2x / week    PT Duration 6 weeks    PT Treatment/Interventions Cryotherapy;Electrical Stimulation;Ultrasound;Moist Heat;Iontophoresis 4mg /ml Dexamethasone;Gait training;Functional mobility training;Stair training;Therapeutic activities;Therapeutic exercise;Balance training;Manual techniques;Patient/family education;Dry needling;Taping;Vasopneumatic Device    PT Next Visit Plan Assess repsonse to HEP. Provide care to R shoulder or L hip as indicated with use of modalities as indicated.    PT Home Exercise Plan [id number]. Scapular retractions, pendulum, Sitting [place] jt distraction, scapular retraction with Tband, shoulder extension c IR with Tband, TFL stretch in standing, hip abd SLR in standing with Theraband, clamshell, hip add isometric c ball in supine. Hamsting and hip flexor stretch.    Consulted and Agree with Plan of Care Patient;Other (Comment)   Interpeter-Gail Fitzgerald          Patient will benefit from skilled therapeutic intervention in order to improve the following deficits and impairments:  Decreased activity tolerance, Decreased knowledge of precautions, Decreased strength, Postural dysfunction, Pain  Visit Diagnosis: Rotator cuff syndrome of right shoulder  Chronic right shoulder pain  Lateral pain of left hip     Problem List There are no problems to  display for this patient.   Gar Ponto MS, PT 06/07/20 7:51 AM  [facility] [address] [place], [place], [address] Phone: [phone]   Fax:  [phone]  Name: Gail Fitzgerald MRN: [id number] Date of Birth: 07-12-1935

## 2020-06-08 ENCOUNTER — Other Ambulatory Visit: Payer: Self-pay

## 2020-06-08 ENCOUNTER — Ambulatory Visit: Payer: Medicaid Other

## 2020-06-08 DIAGNOSIS — G8929 Other chronic pain: Secondary | ICD-10-CM

## 2020-06-08 DIAGNOSIS — M25552 Pain in left hip: Secondary | ICD-10-CM

## 2020-06-08 DIAGNOSIS — M75101 Unspecified rotator cuff tear or rupture of right shoulder, not specified as traumatic: Secondary | ICD-10-CM

## 2020-06-08 NOTE — Therapy (Signed)
[place], [place], [address] Phone: [phone]   Fax:  [phone]  Physical Therapy Treatment  Patient Details  Name: Gail Fitzgerald MRN: [id number] Date of Birth: [birth year] Referring Provider (PT): Gentry Fitz, MD   Encounter Date: 06/08/2020   PT End of Session - 06/08/20 0925    Visit Number 9    Number of Visits 13    Date for PT Re-Evaluation 06/30/20    Authorization Type [place] MEDICAID HEALTHY BLUE    Authorization Time Period 05/16/20-07/04/20    Authorization - Visit Number 8    Authorization - Number of Visits 12    PT Start Time 0925    PT Stop Time 1010    PT Time Calculation (min) 45 min    Activity Tolerance Patient tolerated treatment well    Behavior During Therapy [facility] for tasks assessed/performed           History reviewed. No pertinent past medical history.  History reviewed. No pertinent surgical history.  There were no vitals filed for this visit.   Subjective Assessment - 06/08/20 0928    Subjective Pt reports having some discomfort on the anterior aspect of her l hip with walking, not laterally. Intermittent R shoulder discomfort c qucik arm movements. Currently, no pain in either areas.    Patient is accompained by: Family member   DIL   Patient Stated Goals To have less pain and use my R arm better with activites above shoulder height and gardening    Currently in Pain? No/denies    Pain Score 2     Pain Location Hip    Pain Orientation Left;Anterior;Proximal    Pain Descriptors / Indicators Discomfort    Pain Type Chronic pain    Pain Onset Other (comment)   3 years                            [facility] Adult PT Treatment/Exercise - 06/08/20 0001      Knee/Hip Exercises: Stretches   Gastroc Stretch Right;Left;2 reps;20 seconds   Book under forefoot   Other Knee/Hip Stretches 2 chair standing hip flexor/quad stretch 2x; 20 sec R       Knee/Hip Exercises: Standing   Hip Abduction AROM;Stengthening;2 sets;10 reps;Knee straight;Right;Left    Abduction Limitations Green Theraband proximal to knees      Knee/Hip Exercises: Supine   Hip Adduction Isometric Strengthening;Both;10 reps    Hip Adduction Isometric Limitations Ball squeeze 3 sec    Bridges with [employer] Strengthening;Both;10 reps    Straight Leg Raises Strengthening;Left;15 reps      Knee/Hip Exercises: Sidelying   Clams 10x; 2 sets; GTB                    PT Short Term Goals - 05/13/20 0817      PT SHORT TERM GOAL #1   Title Pt will be Ind in an initial HEP    Baseline Started on eval    Time 3    Period Weeks    Status New    Target Date 06/03/20      PT SHORT TERM GOAL #2   Title Pt will voice understanding of measures to assist in R shoulder pain redution and management    Time 3    Period Weeks    Status New    Target Date 06/03/20  PT Long Term Goals - 05/13/20 5462      PT LONG TERM GOAL #1   Title Pt will be Ind in a final HEP    Time 7    Period Weeks    Status New    Target Date 07/01/20      PT LONG TERM GOAL #2   Title Per MMT pt will demonstrate 4+/5 strength of the R shoulder    Baseline 4 to 4+/5    Time 7    Period Weeks    Status New    Target Date 07/01/20      PT LONG TERM GOAL #3   Title Pt will report completion of household actiities and/or gardening with use of the R UE with pain level not exceeding 3/10    Baseline 8/10    Time 7    Period Weeks    Status New    Target Date 07/01/20                 Plan - 06/08/20 0941    Clinical Impression Statement With walking pt is having discomfort at the ant hip and down her thigh. Pt reports pulling L hip/thigh c advancing R LE. Pt was not TTP along this area. FABER, FADIR, and compression testing were negative. Appears issue is a possible strain or response to increase in [place]. Provided pt with strengthening and stretching exs  of the R hip flexors and quads. Pt will continue to benefit from PT to address l shoulder and hip discomfort through ther ex. Discussed possible reduction of visits as indicated.    Examination-Activity Limitations Carry;Dressing;Lift;Sleep;Reach Overhead    Stability/Clinical Decision Making Stable/Uncomplicated    Clinical Decision Making Low    Rehab Potential Good    PT Frequency 2x / week    PT Duration 6 weeks    PT Treatment/Interventions Cryotherapy;Electrical Stimulation;Ultrasound;Moist Heat;Iontophoresis 4mg /ml Dexamethasone;Gait training;Functional mobility training;Stair training;Therapeutic activities;Therapeutic exercise;Balance training;Manual techniques;Patient/family education;Dry needling;Taping;Vasopneumatic Device    PT Next Visit Plan Assess repsonse to HEP. Provide care to R shoulder or L hip as indicated with use of modalities as indicated.    PT Home Exercise Plan [id number]. Scapular retractions, pendulum, Sitting [place] jt distraction, scapular retraction with Tband, shoulder extension c IR with Tband, TFL stretch in standing, hip abd SLR in standing with Theraband, clamshell, hip add isometric c ball in supine. Hamsting and hip flexor stretch.    Consulted and Agree with Plan of Care Patient;Other (Comment)           Patient will benefit from skilled therapeutic intervention in order to improve the following deficits and impairments:  Decreased activity tolerance, Decreased knowledge of precautions, Decreased strength, Postural dysfunction, Pain  Visit Diagnosis: Rotator cuff syndrome of right shoulder  Chronic right shoulder pain  Lateral pain of left hip     Problem List There are no problems to display for this patient.   Gar Ponto MS, PT 06/08/20 11:28 AM  [facility] [address] [place], [place], [address] Phone: [phone]   Fax:  [phone]  Name: Gail Fitzgerald MRN: [id number] Date of  Birth: [birth year]

## 2020-06-13 ENCOUNTER — Encounter: Payer: Self-pay | Admitting: Physical Therapy

## 2020-06-13 ENCOUNTER — Ambulatory Visit: Payer: Medicaid Other | Attending: Family Medicine | Admitting: Physical Therapy

## 2020-06-13 ENCOUNTER — Other Ambulatory Visit: Payer: Self-pay

## 2020-06-13 DIAGNOSIS — M25552 Pain in left hip: Secondary | ICD-10-CM

## 2020-06-13 DIAGNOSIS — M25511 Pain in right shoulder: Secondary | ICD-10-CM | POA: Insufficient documentation

## 2020-06-13 DIAGNOSIS — G8929 Other chronic pain: Secondary | ICD-10-CM | POA: Diagnosis present

## 2020-06-13 DIAGNOSIS — M75101 Unspecified rotator cuff tear or rupture of right shoulder, not specified as traumatic: Secondary | ICD-10-CM | POA: Diagnosis present

## 2020-06-13 NOTE — Therapy (Signed)
[place], [place], [address] Phone: [phone]   Fax:  [phone]  Physical Therapy Treatment  Patient Details  Name: Gail Fitzgerald MRN: [id number] Date of Birth: 07/06/1935 Referring Provider (PT): Gentry Fitz, MD   Encounter Date: 06/13/2020   PT End of Session - 06/13/20 1248    Visit Number 10    Number of Visits 13    Date for PT Re-Evaluation 06/30/20    Authorization Type [place] MEDICAID HEALTHY BLUE    Authorization Time Period 05/16/20-07/04/20    Authorization - Visit Number 9    Authorization - Number of Visits 12    PT Start Time 0867    PT Stop Time 1226    PT Time Calculation (min) 39 min    Activity Tolerance Patient limited by fatigue;Patient limited by pain    Behavior During Therapy [facility] for tasks assessed/performed           History reviewed. No pertinent past medical history.  History reviewed. No pertinent surgical history.  There were no vitals filed for this visit.   Subjective Assessment - 06/13/20 1222    Subjective Pt. reports had Covid vaccine booster 2 days ago and felt side effects with some general muscle aches-feeling better today than yesterday but still feeling some achiness and fatigue so requests more passive treatment session focus today. She requests to focus on left hip-reports pain 3-4/10 in lateral hip, thigh and intermittently into groin region.    Patient is accompained by: Interpreter   [employer]   Currently in Pain? Yes    Pain Score --   3-4   Pain Location Hip    Pain Orientation Left;Lateral;Proximal;Anterior    Pain Descriptors / Indicators Discomfort    Pain Type Chronic pain    Pain Onset More than a month ago    Pain Frequency Intermittent    Aggravating Factors  walking    Pain Relieving Factors rest    Effect of Pain on Daily Activities impacts walking tolerance                             [facility]  Adult PT Treatment/Exercise - 06/13/20 0001      Knee/Hip Exercises: Stretches   ITB Stretch Left;3 reps;30 seconds    Piriformis Stretch Left;3 reps;30 seconds    Other Knee/Hip Stretches left SKTC assisted by therapist x 10 reps brief holds-initially reported hip pain with flexion past 90 deg but tolerated to 90 deg with adjustment of ROM for stretch      Knee/Hip Exercises: Supine   Other Supine Knee/Hip Exercises LTR-attempted as variation for lateral hip and QL stretch but pt. reported "cramping" in her legs so held      Knee/Hip Exercises: Sidelying   Clams 2x10 bodyweight only today      Modalities   Modalities Ultrasound      Ultrasound   Ultrasound Location left greater trochanterirc region    Ultrasound Parameters 3 MHZ 100% 1.0 W/cm2    Ultrasound Goals Pain      Manual Therapy   Manual Therapy Joint mobilization    Joint Mobilization LAD left hip grade I-III oscillations    Soft tissue mobilization IASTM left lateral hip and lateral + anterior thight with foam roller                  PT Education - 06/13/20 1245    Education  Details POC    Person(s) Educated Patient    Methods Explanation    Comprehension Verbalized understanding            PT Short Term Goals - 05/13/20 0817      PT SHORT TERM GOAL #1   Title Pt will be Ind in an initial HEP    Baseline Started on eval    Time 3    Period Weeks    Status New    Target Date 06/03/20      PT SHORT TERM GOAL #2   Title Pt will voice understanding of measures to assist in R shoulder pain redution and management    Time 3    Period Weeks    Status New    Target Date 06/03/20             PT Long Term Goals - 05/13/20 0821      PT LONG TERM GOAL #1   Title Pt will be Ind in a final HEP    Time 7    Period Weeks    Status New    Target Date 07/01/20      PT LONG TERM GOAL #2   Title Per MMT pt will demonstrate 4+/5 strength of the R shoulder    Baseline 4 to 4+/5    Time 7     Period Weeks    Status New    Target Date 07/01/20      PT LONG TERM GOAL #3   Title Pt will report completion of household actiities and/or gardening with use of the R UE with pain level not exceeding 3/10    Baseline 8/10    Time 7    Period Weeks    Status New    Target Date 07/01/20                 Plan - 06/13/20 1250    Clinical Impression Statement More passive tx. focus today as noted in subjective per pt. request due to residual side effects s/p Covid vaccine booster 2 days ago so more focus manual and included modalities with [place] to left hip. Still uncertain etiology of hip symptoms in anterior hip/thigh if muscular vs. associated with underlying OA/intrinsic hip issuebut will continue to monitor. Plan resume more active tx. session focus next visit pending pt. tolerance.    Personal Factors and Comorbidities Age    Examination-Activity Limitations Carry;Dressing;Lift;Sleep;Reach Overhead    Stability/Clinical Decision Making Stable/Uncomplicated    Clinical Decision Making Low    Rehab Potential Good    PT Frequency 2x / week    PT Duration 6 weeks    PT Treatment/Interventions Cryotherapy;Electrical Stimulation;Ultrasound;Moist Heat;Iontophoresis 4mg /ml Dexamethasone;Gait training;Functional mobility training;Stair training;Therapeutic activities;Therapeutic exercise;Balance training;Manual techniques;Patient/family education;Dry needling;Taping;Vasopneumatic Device    PT Next Visit Plan Monitor symptoms/status post recent Covid vaccine booster and resume more active tx. focus as tolerated for left hip vs. right shoulder pending region of greatest need for tx.    PT Home Exercise Plan [id number]. Scapular retractions, pendulum, Sitting [place] jt distraction, scapular retraction with Tband, shoulder extension c IR with Tband, TFL stretch in standing, hip abd SLR in standing with Theraband, clamshell, hip add isometric c ball in supine. Hamsting and hip flexor stretch.     Consulted and Agree with Plan of Care Patient           Patient will benefit from skilled therapeutic intervention in order to improve the following deficits and impairments:  Decreased  activity tolerance, Decreased knowledge of precautions, Decreased strength, Postural dysfunction, Pain  Visit Diagnosis: Rotator cuff syndrome of right shoulder  Chronic right shoulder pain  Lateral pain of left hip     Problem List There are no problems to display for this patient.   [name], PT, DPT 06/13/20 12:55 PM  [facility] [address] [place], [place], [address] Phone: [phone]   Fax:  [phone]  Name: Gail Fitzgerald MRN: [id number] Date of Birth: [birth year]

## 2020-06-15 ENCOUNTER — Ambulatory Visit: Payer: Medicaid Other | Admitting: Physical Therapy

## 2020-06-15 ENCOUNTER — Other Ambulatory Visit: Payer: Self-pay

## 2020-06-15 ENCOUNTER — Encounter: Payer: Self-pay | Admitting: Physical Therapy

## 2020-06-15 DIAGNOSIS — M25552 Pain in left hip: Secondary | ICD-10-CM

## 2020-06-15 DIAGNOSIS — M75101 Unspecified rotator cuff tear or rupture of right shoulder, not specified as traumatic: Secondary | ICD-10-CM

## 2020-06-15 DIAGNOSIS — G8929 Other chronic pain: Secondary | ICD-10-CM

## 2020-06-15 NOTE — Therapy (Signed)
[place], [place], [address] Phone: [phone]   Fax:  [phone]  Physical Therapy Treatment  Patient Details  Name: Gail Fitzgerald MRN: [id number] Date of Birth: 25-Dec-1934 Referring Provider (PT): Gentry Fitz, MD   Encounter Date: 06/15/2020   PT End of Session - 06/15/20 1152    Visit Number 11    Number of Visits 13    Date for PT Re-Evaluation 06/30/20    Authorization Type [place] MEDICAID HEALTHY BLUE    Authorization Time Period 05/16/20-07/04/20    Authorization - Visit Number 10    Authorization - Number of Visits 12    PT Start Time 9735    PT Stop Time 1228    PT Time Calculation (min) 43 min    Activity Tolerance Patient tolerated treatment well    Behavior During Therapy [facility] for tasks assessed/performed           History reviewed. No pertinent past medical history.  History reviewed. No pertinent surgical history.  There were no vitals filed for this visit.   Subjective Assessment - 06/15/20 1149    Subjective Pt. reports feeling better today (covid booster side effects resolved). She requests to focus the session on her shoulder with hip exercises too if time.    Patient is accompained by: Family member   daughter in law                            [facility] Adult PT Treatment/Exercise - 06/15/20 0001      Knee/Hip Exercises: Aerobic   Nustep L4 x 5 min UE/LE      Knee/Hip Exercises: Standing   Hip Abduction AROM;Stengthening;Both;2 sets;10 reps;Knee straight    Abduction Limitations green band proximal to knees      Shoulder Exercises: Standing   External Rotation AROM;Strengthening;Right;20 reps    Theraband Level (Shoulder External Rotation) Level 2 (Red)    External Rotation Limitations 2x10    Internal Rotation AROM;Strengthening;Right;20 reps    Theraband Level (Shoulder Internal Rotation) Level 4 (Blue)    Internal Rotation Limitations 2x10     Flexion AROM;Strengthening;Right;20 reps    Shoulder Flexion Weight (lbs) 2    Extension AROM;Strengthening;Both;20 reps    Theraband Level (Shoulder Extension) Level 4 (Blue)    Extension Limitations 2x10    Row AROM;Strengthening;Right;20 reps    Row Limitations Black cord    Other Standing Exercises Theraband bicep curl green band 2x10   pt. had been performing at home so reviewed form   Other Standing Exercises wall push ups 2x10      Shoulder Exercises: Stretch   Corner Stretch 2 reps;30 seconds    Corner Stretch Limitations in doorway                  PT Education - 06/15/20 1239    Education Details exercises, HEP    Person(s) Educated Patient    Methods Explanation;Demonstration;Verbal cues    Comprehension Verbalized understanding;Returned demonstration;Verbal cues required            PT Short Term Goals - 05/13/20 0817      PT SHORT TERM GOAL #1   Title Pt will be Ind in an initial HEP    Baseline Started on eval    Time 3    Period Weeks    Status New    Target Date 06/03/20      PT SHORT TERM  GOAL #2   Title Pt will voice understanding of measures to assist in R shoulder pain redution and management    Time 3    Period Weeks    Status New    Target Date 06/03/20             PT Long Term Goals - 05/13/20 0821      PT LONG TERM GOAL #1   Title Pt will be Ind in a final HEP    Time 7    Period Weeks    Status New    Target Date 07/01/20      PT LONG TERM GOAL #2   Title Per MMT pt will demonstrate 4+/5 strength of the R shoulder    Baseline 4 to 4+/5    Time 7    Period Weeks    Status New    Target Date 07/01/20      PT LONG TERM GOAL #3   Title Pt will report completion of household actiities and/or gardening with use of the R UE with pain level not exceeding 3/10    Baseline 8/10    Time 7    Period Weeks    Status New    Target Date 07/01/20                 Plan - 06/15/20 1240    Clinical Impression Statement  Resumed more active tx. today with focus on right shoulder strengthening. Mild soreness in ER and fatigues with open chain flexion but otherwise strengthening progression well-tolerated. Still concern for underlying rotator cuff pathology but improving with functional abilities with strengthening.    Personal Factors and Comorbidities Age    Examination-Activity Limitations Carry;Dressing;Lift;Sleep;Reach Overhead    Stability/Clinical Decision Making Stable/Uncomplicated    Clinical Decision Making Low    Rehab Potential Good    PT Frequency 2x / week    PT Duration 6 weeks    PT Treatment/Interventions Cryotherapy;Electrical Stimulation;Ultrasound;Moist Heat;Iontophoresis 4mg /ml Dexamethasone;Gait training;Functional mobility training;Stair training;Therapeutic activities;Therapeutic exercise;Balance training;Manual techniques;Patient/family education;Dry needling;Taping;Vasopneumatic Device    PT Next Visit Plan Continue right shoulder strengthening and hip strengthening/stretches prn, modalities for hip prn    PT Home Exercise Plan [id number]. Scapular retractions, pendulum, Sitting [place] jt distraction, scapular retraction with Tband, shoulder extension c IR with Tband, TFL stretch in standing, hip abd SLR in standing with Theraband, clamshell, hip add isometric c ball in supine. Hamsting and hip flexor stretch.    Consulted and Agree with Plan of Care Patient           Patient will benefit from skilled therapeutic intervention in order to improve the following deficits and impairments:  Decreased activity tolerance, Decreased knowledge of precautions, Decreased strength, Postural dysfunction, Pain  Visit Diagnosis: Chronic right shoulder pain  Rotator cuff syndrome of right shoulder  Lateral pain of left hip     Problem List There are no problems to display for this patient.   Gail Fitzgerald, PT, DPT 06/15/20 12:45 PM  [facility] [facility] [address] [place], [place], [address] Phone: [phone]   Fax:  [phone]  Name: Gail Fitzgerald MRN: [id number] Date of Birth: Aug 07, 1935

## 2020-06-20 ENCOUNTER — Ambulatory Visit: Payer: Medicaid Other | Admitting: Physical Therapy

## 2020-06-20 ENCOUNTER — Encounter: Payer: Self-pay | Admitting: Physical Therapy

## 2020-06-20 ENCOUNTER — Other Ambulatory Visit: Payer: Self-pay

## 2020-06-20 DIAGNOSIS — M75101 Unspecified rotator cuff tear or rupture of right shoulder, not specified as traumatic: Secondary | ICD-10-CM

## 2020-06-20 DIAGNOSIS — M25552 Pain in left hip: Secondary | ICD-10-CM

## 2020-06-20 DIAGNOSIS — G8929 Other chronic pain: Secondary | ICD-10-CM

## 2020-06-20 NOTE — Therapy (Signed)
[place], [place], [address] Phone: [phone]   Fax:  [phone]  Physical Therapy Treatment  Patient Details  Name: Gail Fitzgerald MRN: [id number] Date of Birth: 1935/02/07 Referring Provider (PT): Gentry Fitz, MD   Encounter Date: 06/20/2020   PT End of Session - 06/20/20 1012    Visit Number 12    Number of Visits 13    Date for PT Re-Evaluation 06/30/20    Authorization Type [place] MEDICAID HEALTHY BLUE    Authorization Time Period 05/16/20-07/04/20    Authorization - Visit Number 11    Authorization - Number of Visits 12    PT Start Time 0933    PT Stop Time 8502    PT Time Calculation (min) 41 min    Activity Tolerance Patient tolerated treatment well    Behavior During Therapy [facility] for tasks assessed/performed           History reviewed. No pertinent past medical history.  History reviewed. No pertinent surgical history.  There were no vitals filed for this visit.   Subjective Assessment - 06/20/20 0935    Subjective Pt. reports did some walking yesterday (about 2 miles) and had some pain in left anterior hip. No pain pre-tx. this AM.    Patient is accompained by: Family member   granddaughter   Currently in Pain? No/denies              [facility] PT Assessment - 06/20/20 0001      Strength   Right Shoulder Flexion 5/5    Right Shoulder ABduction 4+/5    Right Shoulder Internal Rotation 5/5    Right Shoulder External Rotation 4+/5                         [facility] Adult PT Treatment/Exercise - 06/20/20 0001      Knee/Hip Exercises: Stretches   Hip Flexor Stretch Limitations attempted supine manual stretch but held due to pain, reviewed HEP self-stretch which was well-tolerated      Knee/Hip Exercises: Aerobic   Nustep L4 x 5 min UE/LE      Shoulder Exercises: Supine   Protraction Limitations serratus punch 4 lbs. 2x10 cues to keep elbow extended    Flexion  Limitations supine short arc flexion "punch" 2x10 with 4 lbs.      Shoulder Exercises: Standing   External Rotation AROM;Strengthening;Right;20 reps    Theraband Level (Shoulder External Rotation) Level 2 (Red)    External Rotation Limitations 2x10    Internal Rotation AROM;Strengthening;Right;20 reps    Theraband Level (Shoulder Internal Rotation) Level 4 (Blue)    Internal Rotation Limitations 2x10, cues to keep elbow at side    Flexion AROM;Strengthening;Right;20 reps    Shoulder Flexion Weight (lbs) 2    ABduction AROM;Strengthening;Right;20 reps    ABduction Limitations 2x10 plane of scaption    Extension AROM;Strengthening;Both;20 reps    Theraband Level (Shoulder Extension) Level 4 (Blue)    Extension Limitations 2x10, cues to keep elbows extended    Row AROM;Strengthening;Right;20 reps    Row Limitations Black cord      Manual Therapy   Joint Mobilization LAD left hip grade I-III oscillations    Soft tissue mobilization IASTM/foam roll left anterior hip and thigh                  PT Education - 06/20/20 1004    Education Details HEP-hip flexor stretch, POC  Person(s) Educated Patient    Methods Explanation;Demonstration;Verbal cues    Comprehension Verbalized understanding;Returned demonstration            PT Short Term Goals - 05/13/20 0817      PT SHORT TERM GOAL #1   Title Pt will be Ind in an initial HEP    Baseline Started on eval    Time 3    Period Weeks    Status New    Target Date 06/03/20      PT SHORT TERM GOAL #2   Title Pt will voice understanding of measures to assist in R shoulder pain redution and management    Time 3    Period Weeks    Status New    Target Date 06/03/20             PT Long Term Goals - 05/13/20 0821      PT LONG TERM GOAL #1   Title Pt will be Ind in a final HEP    Time 7    Period Weeks    Status New    Target Date 07/01/20      PT LONG TERM GOAL #2   Title Per MMT pt will demonstrate 4+/5 strength  of the R shoulder    Baseline 4 to 4+/5    Time 7    Period Weeks    Status New    Target Date 07/01/20      PT LONG TERM GOAL #3   Title Pt will report completion of household actiities and/or gardening with use of the R UE with pain level not exceeding 3/10    Baseline 8/10    Time 7    Period Weeks    Status New    Target Date 07/01/20                 Plan - 06/20/20 0950    Clinical Impression Statement Left hip pain more in anterior hip region which could be consistent with hip flexor strain vs. underlying OA but uncertain if pt. has had X-rays (pt. uncertain) to correlate for OA. For shoulder still with some pain with abduction consistent with impingement but functionally progressing well.    Personal Factors and Comorbidities Age    Examination-Activity Limitations Carry;Dressing;Lift;Sleep;Reach Overhead    Stability/Clinical Decision Making Stable/Uncomplicated    Clinical Decision Making Low    Rehab Potential Good    PT Frequency 2x / week    PT Duration 6 weeks    PT Treatment/Interventions Cryotherapy;Electrical Stimulation;Ultrasound;Moist Heat;Iontophoresis 4mg /ml Dexamethasone;Gait training;Functional mobility training;Stair training;Therapeutic activities;Therapeutic exercise;Balance training;Manual techniques;Patient/family education;Dry needling;Taping;Vasopneumatic Device    PT Next Visit Plan Next session recert vs. d/c pending need for further skilled therapy, continue right shoulder strengthening and hip strengthening/stretches prn, modalities for hip prn    PT Home Exercise Plan [id number]. Scapular retractions, pendulum, Sitting [place] jt distraction, scapular retraction with Tband, shoulder extension c IR with Tband, TFL stretch in standing, hip abd SLR in standing with Theraband, clamshell, hip add isometric c ball in supine. Hamsting and hip flexor stretch.    Consulted and Agree with Plan of Care Patient;Family member/caregiver           Patient will  benefit from skilled therapeutic intervention in order to improve the following deficits and impairments:  Decreased activity tolerance, Decreased knowledge of precautions, Decreased strength, Postural dysfunction, Pain  Visit Diagnosis: Rotator cuff syndrome of right shoulder  Chronic right shoulder pain  Lateral pain of left  hip     Problem List There are no problems to display for this patient.   [name], PT, DPT 06/20/20 12:22 PM  [facility] [address] [place], [place], [address] Phone: [phone]   Fax:  [phone]  Name: Gail Fitzgerald MRN: [id number] Date of Birth: 06/28/1935

## 2020-06-20 NOTE — Therapy (Signed)
[place], [place], [address] Phone: [phone]   Fax:  [phone]  Physical Therapy Treatment  Patient Details  Name: Gail Fitzgerald MRN: [id number] Date of Birth: [birth year] Referring Provider (PT): Gentry Fitz, MD   Encounter Date: 06/20/2020   PT End of Session - 06/20/20 1012    Visit Number 12    Number of Visits 13    Date for PT Re-Evaluation 06/30/20    Authorization Type [place] MEDICAID HEALTHY BLUE    Authorization Time Period 05/16/20-07/04/20    Authorization - Visit Number 11    Authorization - Number of Visits 12    PT Start Time 0933    PT Stop Time 9169    PT Time Calculation (min) 41 min    Activity Tolerance Patient tolerated treatment well    Behavior During Therapy [facility] for tasks assessed/performed           History reviewed. No pertinent past medical history.  History reviewed. No pertinent surgical history.  There were no vitals filed for this visit.   Subjective Assessment - 06/20/20 0935    Subjective Pt. reports did some walking yesterday (about 2 miles) and had some pain in left anterior hip. No pain pre-tx. this AM.    Patient is accompained by: Family member   granddaughter   Currently in Pain? No/denies              [facility] PT Assessment - 06/20/20 0001      Strength   Right Shoulder Flexion 5/5    Right Shoulder ABduction 4+/5    Right Shoulder Internal Rotation 5/5    Right Shoulder External Rotation 4+/5                         [facility] Adult PT Treatment/Exercise - 06/20/20 0001      Knee/Hip Exercises: Stretches   Hip Flexor Stretch Limitations attempted supine manual stretch but held due to pain, reviewed HEP self-stretch which was well-tolerated      Knee/Hip Exercises: Aerobic   Nustep L4 x 5 min UE/LE      Shoulder Exercises: Supine   Protraction Limitations serratus punch 4 lbs. 2x10 cues to keep elbow extended    Flexion  Limitations supine short arc flexion "punch" 2x10 with 4 lbs.      Shoulder Exercises: Standing   External Rotation AROM;Strengthening;Right;20 reps    Theraband Level (Shoulder External Rotation) Level 2 (Red)    External Rotation Limitations 2x10    Internal Rotation AROM;Strengthening;Right;20 reps    Theraband Level (Shoulder Internal Rotation) Level 4 (Blue)    Internal Rotation Limitations 2x10, cues to keep elbow at side    Flexion AROM;Strengthening;Right;20 reps    Shoulder Flexion Weight (lbs) 2    ABduction AROM;Strengthening;Right;20 reps    ABduction Limitations 2x10 plane of scaption    Extension AROM;Strengthening;Both;20 reps    Theraband Level (Shoulder Extension) Level 4 (Blue)    Extension Limitations 2x10, cues to keep elbows extended    Row AROM;Strengthening;Right;20 reps    Row Limitations Black cord      Manual Therapy   Joint Mobilization LAD left hip grade I-III oscillations    Soft tissue mobilization IASTM/foam roll left anterior hip and thigh                  PT Education - 06/20/20 1004    Education Details HEP-hip flexor stretch, POC  Person(s) Educated Patient    Methods Explanation;Demonstration;Verbal cues    Comprehension Verbalized understanding;Returned demonstration            PT Short Term Goals - 05/13/20 0817      PT SHORT TERM GOAL #1   Title Pt will be Ind in an initial HEP    Baseline Started on eval    Time 3    Period Weeks    Status New    Target Date 06/03/20      PT SHORT TERM GOAL #2   Title Pt will voice understanding of measures to assist in R shoulder pain redution and management    Time 3    Period Weeks    Status New    Target Date 06/03/20             PT Long Term Goals - 05/13/20 0821      PT LONG TERM GOAL #1   Title Pt will be Ind in a final HEP    Time 7    Period Weeks    Status New    Target Date 07/01/20      PT LONG TERM GOAL #2   Title Per MMT pt will demonstrate 4+/5 strength  of the R shoulder    Baseline 4 to 4+/5    Time 7    Period Weeks    Status New    Target Date 07/01/20      PT LONG TERM GOAL #3   Title Pt will report completion of household actiities and/or gardening with use of the R UE with pain level not exceeding 3/10    Baseline 8/10    Time 7    Period Weeks    Status New    Target Date 07/01/20                 Plan - 06/20/20 0950    Clinical Impression Statement Left hip pain more in anterior hip region which could be consistent with hip flexor strain vs. underlying OA but uncertain if pt. has had X-rays (pt. uncertain) to correlate for OA. For shoulder still with some pain with abduction consistent with impingement but functionally progressing well.           Patient will benefit from skilled therapeutic intervention in order to improve the following deficits and impairments:  Decreased activity tolerance, Decreased knowledge of precautions, Decreased strength, Postural dysfunction, Pain  Visit Diagnosis: Rotator cuff syndrome of right shoulder  Chronic right shoulder pain  Lateral pain of left hip     Problem List There are no problems to display for this patient.   Gail Fitzgerald, PT, DPT 06/20/20 10:15 AM  [place] [facility] [address] [place], [place], [address] Phone: [phone]   Fax:  [phone]  Name: Gail Fitzgerald MRN: [id number] Date of Birth: Feb 10, 1935

## 2020-06-22 ENCOUNTER — Ambulatory Visit: Payer: Medicaid Other

## 2020-06-29 ENCOUNTER — Ambulatory Visit: Payer: Medicaid Other

## 2020-06-29 ENCOUNTER — Other Ambulatory Visit: Payer: Self-pay

## 2020-06-29 DIAGNOSIS — G8929 Other chronic pain: Secondary | ICD-10-CM

## 2020-06-29 DIAGNOSIS — M25552 Pain in left hip: Secondary | ICD-10-CM

## 2020-06-29 DIAGNOSIS — M75101 Unspecified rotator cuff tear or rupture of right shoulder, not specified as traumatic: Secondary | ICD-10-CM

## 2020-06-30 NOTE — Therapy (Signed)
[place], [place], [address] Phone: [phone]   Fax:  [phone]  Physical Therapy Treatment/Discharge Summary  Patient Details  Name: Gail Fitzgerald MRN: [id number] Date of Birth: July 15, 1935 Referring Provider (PT): Gentry Fitz, MD   Encounter Date: 06/29/2020   PT End of Session - 06/29/20 1002    Visit Number 13    Number of Visits 13    Date for PT Re-Evaluation 06/30/20    Authorization Type [place] MEDICAID HEALTHY BLUE    Authorization Time Period 05/16/20-07/04/20    Authorization - Visit Number 12    Authorization - Number of Visits 12    PT Start Time 1001    PT Stop Time 1045    PT Time Calculation (min) 44 min    Activity Tolerance Patient tolerated treatment well    Behavior During Therapy [facility] for tasks assessed/performed           History reviewed. No pertinent past medical history.  History reviewed. No pertinent surgical history.  There were no vitals filed for this visit.   Subjective Assessment - 06/29/20 1005    Subjective Pt reports she is not having R SH or L hip pain today. When she has pain she states it is occasional and and at a low level. Pt is plesed with her progress.    Patient is accompained by: Family member    Limitations House hold activities;Lifting;Other (comment)    Patient Stated Goals To have less pain and use my R arm better with activites above shoulder height and gardening    Currently in Pain? No/denies    Pain Score --   1-2/10   Pain Location Hip   R Shoulder   Pain Orientation Left    Pain Descriptors / Indicators Discomfort    Pain Type Chronic pain    Pain Onset More than a month ago    Pain Frequency Occasional                             [facility] Adult PT Treatment/Exercise - 06/30/20 0001      Knee/Hip Exercises: Stretches   Hip Flexor Stretch Left;2 reps;20 seconds    Hip Flexor Stretch Limitations supine and  standing    ITB Stretch Left;1 rep;10 seconds    ITB Stretch Limitations standing      Knee/Hip Exercises: Standing   Hip Abduction AROM;Stengthening;Both;10 reps;Knee straight    Abduction Limitations green band proximal to knees      Knee/Hip Exercises: Supine   Bridges with Ball Squeeze Strengthening;Both;1 set;10 reps      Knee/Hip Exercises: Sidelying   Clams L, 10x      Shoulder Exercises: Supine   Protraction Limitations serratus punch 4 lbs. 2x10 cues to keep elbow extended      Shoulder Exercises: Sidelying   External Rotation Strengthening;Right;10 reps    External Rotation Weight (lbs) 2      Shoulder Exercises: Standing   External Rotation AROM;Strengthening;Right;10 reps    Theraband Level (Shoulder External Rotation) Level 2 (Red)    Extension AROM;Strengthening;Both;10 reps    Theraband Level (Shoulder Extension) Level 4 (Blue)    Row AROM;Strengthening;Right;10 reps    Theraband Level (Shoulder Row) Level 4 (Blue)      Shoulder Exercises: Stretch   Other Shoulder Stretches Upper trap/GH jt distraction 3x; 10 sec  PT Education - 06/30/20 0716    Education Details Reviewed final HEP for the R SH and L hip    Person(s) Educated Patient   DIL   Methods Explanation;Demonstration;Tactile cues;Verbal cues;Handout    Comprehension Verbalized understanding;Returned demonstration            PT Short Term Goals - 06/30/20 0726      PT SHORT TERM GOAL #1   Title Pt will be Ind in an initial HEP    Status Achieved    Target Date 06/29/20      PT SHORT TERM GOAL #2   Title Pt will voice understanding of measures to assist in R shoulder pain redution and management    Status Achieved    Target Date 06/29/20             PT Long Term Goals - 06/30/20 0727      PT LONG TERM GOAL #1   Title Pt will be Ind in a final HEP    Status Achieved    Target Date 06/29/20      PT LONG TERM GOAL #2   Title Per MMT pt will demonstrate 4+/5  strength of the R shoulder    Baseline 4 to 4+/5    Status Achieved    Target Date 06/29/20      PT LONG TERM GOAL #3   Title Pt will report completion of household actiities and/or gardening with use of the R UE with pain level not exceeding 3/10. 1-2/10 for R SH and L hip    Baseline 8/10    Status Achieved    Target Date 06/29/20                 Plan - 06/30/20 0717    Clinical Impression Statement Discussed with pt re: progress and current status. Pt has made good progress re: reduction of pain and improvement of function for her R SH and L hip. 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 Recommend DC at this time and pt is in agreement. PT has met all gaols and is Ind c a final HEP to continue rehab at home.    Personal Factors and Comorbidities Finances    Examination-Activity Limitations Carry;Dressing;Lift;Sleep;Reach Overhead    Stability/Clinical Decision Making Stable/Uncomplicated    Clinical Decision Making Low    PT Frequency 2x / week    PT Duration 6 weeks    PT Treatment/Interventions Cryotherapy;Electrical Stimulation;Ultrasound;Moist Heat;Iontophoresis 43m/ml Dexamethasone;Gait training;Functional mobility training;Stair training;Therapeutic activities;Therapeutic exercise;Balance training;Manual techniques;Patient/family education;Dry needling;Taping;Vasopneumatic Device    PT Home Exercise Plan [id number] Scapular retractions, pendulum, Sitting [place] distraction, scapular retraction with Tband, shoulder extension c IR with Tband, TFL stretch in standing, hip abd SLR in standing with Theraband, clamshell, hip add isometric c ball in supine. Hamsting and hip flexor stretch.    Consulted and Agree with Plan of Care Patient;Family member/caregiver    Family Member Consulted DIL           Patient will benefit from skilled therapeutic intervention in order  to improve the following deficits and impairments:  Decreased activity tolerance, Decreased knowledge of precautions, Decreased strength, Postural dysfunction, Pain  Visit Diagnosis: Rotator cuff syndrome of right shoulder  Chronic right shoulder pain  Lateral pain of left hip     Problem List There are no problems to display for this patient.  Eiden Bagot MS, PT 06/30/20 7:37 AM   PHYSICAL THERAPY DISCHARGE SUMMARY  Visits from Start of Care: 13  Current functional level related to goals / functional outcomes: See above   Remaining deficits: Se above   Education / Equipment: HEP  Plan: Patient agrees to discharge.  Patient goals were met. Patient is being discharged due to meeting the stated rehab goals.  ?????   And being pleased with her progress     [place], [place], [address] Phone: [phone]   Fax:  [phone]  Name: Gail Fitzgerald MRN: [id number] Date of Birth: 1935-02-15

## 2021-03-10 ENCOUNTER — Other Ambulatory Visit: Payer: Self-pay

## 2021-03-10 ENCOUNTER — Encounter (HOSPITAL_COMMUNITY): Payer: Self-pay | Admitting: Emergency Medicine

## 2021-03-10 ENCOUNTER — Emergency Department (HOSPITAL_COMMUNITY): Payer: Medicaid Other

## 2021-03-10 ENCOUNTER — Inpatient Hospital Stay (HOSPITAL_COMMUNITY)
Admission: EM | Admit: 2021-03-10 | Discharge: 2021-03-14 | DRG: 187 | Disposition: A | Discharge status: Home / Self Care | Payer: Medicaid Other | Attending: Internal Medicine | Admitting: Internal Medicine

## 2021-03-10 DIAGNOSIS — T3695XA Adverse effect of unspecified systemic antibiotic, initial encounter: Secondary | ICD-10-CM | POA: Diagnosis not present

## 2021-03-10 DIAGNOSIS — I712 Thoracic aortic aneurysm, without rupture: Secondary | ICD-10-CM | POA: Diagnosis present

## 2021-03-10 DIAGNOSIS — R0602 Shortness of breath: Secondary | ICD-10-CM

## 2021-03-10 DIAGNOSIS — Z7982 Long term (current) use of aspirin: Secondary | ICD-10-CM

## 2021-03-10 DIAGNOSIS — E7800 Pure hypercholesterolemia, unspecified: Secondary | ICD-10-CM | POA: Diagnosis present

## 2021-03-10 DIAGNOSIS — I351 Nonrheumatic aortic (valve) insufficiency: Secondary | ICD-10-CM | POA: Diagnosis present

## 2021-03-10 DIAGNOSIS — R0682 Tachypnea, not elsewhere classified: Secondary | ICD-10-CM | POA: Diagnosis present

## 2021-03-10 DIAGNOSIS — E871 Hypo-osmolality and hyponatremia: Secondary | ICD-10-CM | POA: Diagnosis not present

## 2021-03-10 DIAGNOSIS — J90 Pleural effusion, not elsewhere classified: Principal | ICD-10-CM

## 2021-03-10 DIAGNOSIS — Z20822 Contact with and (suspected) exposure to covid-19: Secondary | ICD-10-CM | POA: Diagnosis present

## 2021-03-10 DIAGNOSIS — I10 Essential (primary) hypertension: Secondary | ICD-10-CM | POA: Diagnosis present

## 2021-03-10 DIAGNOSIS — K521 Toxic gastroenteritis and colitis: Secondary | ICD-10-CM | POA: Diagnosis not present

## 2021-03-10 DIAGNOSIS — Z79899 Other long term (current) drug therapy: Secondary | ICD-10-CM

## 2021-03-10 DIAGNOSIS — E785 Hyperlipidemia, unspecified: Secondary | ICD-10-CM | POA: Diagnosis present

## 2021-03-10 DIAGNOSIS — Y92239 Unspecified place in hospital as the place of occurrence of the external cause: Secondary | ICD-10-CM | POA: Diagnosis not present

## 2021-03-10 DIAGNOSIS — R000 Tachycardia, unspecified: Secondary | ICD-10-CM | POA: Diagnosis present

## 2021-03-10 DIAGNOSIS — I4510 Unspecified right bundle-branch block: Secondary | ICD-10-CM | POA: Diagnosis present

## 2021-03-10 HISTORY — DX: Essential (primary) hypertension: I10

## 2021-03-10 LAB — HEPATIC FUNCTION PANEL
ALT: 9 U/L (ref 0–44)
AST: 20 U/L (ref 15–41)
Albumin: 4.1 g/dL (ref 3.5–5.0)
Alkaline Phosphatase: 91 U/L (ref 38–126)
Bilirubin, Direct: 0.2 mg/dL (ref 0.0–0.2)
Indirect Bilirubin: 1 mg/dL — ABNORMAL HIGH (ref 0.3–0.9)
Total Bilirubin: 1.2 mg/dL (ref 0.3–1.2)
Total Protein: 7.9 g/dL (ref 6.5–8.1)

## 2021-03-10 LAB — CBC
HCT: 39.8 % (ref 36.0–46.0)
Hemoglobin: 12.6 g/dL (ref 12.0–15.0)
MCH: 28.3 pg (ref 26.0–34.0)
MCHC: 31.7 g/dL (ref 30.0–36.0)
MCV: 89.4 fL (ref 80.0–100.0)
Platelets: 238 10*3/uL (ref 150–400)
RBC: 4.45 MIL/uL (ref 3.87–5.11)
RDW: 13.2 % (ref 11.5–15.5)
WBC: 9.9 10*3/uL (ref 4.0–10.5)
nRBC: 0 % (ref 0.0–0.2)

## 2021-03-10 LAB — RESP PANEL BY RT-PCR (FLU A&B, COVID) ARPGX2
Influenza A by PCR: NEGATIVE
Influenza B by PCR: NEGATIVE
SARS Coronavirus 2 by RT PCR: NEGATIVE

## 2021-03-10 LAB — BASIC METABOLIC PANEL
Anion gap: 9 (ref 5–15)
BUN: 20 mg/dL (ref 8–23)
CO2: 25 mmol/L (ref 22–32)
Calcium: 8.9 mg/dL (ref 8.9–10.3)
Chloride: 98 mmol/L (ref 98–111)
Creatinine, Ser: 1 mg/dL (ref 0.44–1.00)
GFR, Estimated: 56 mL/min — ABNORMAL LOW (ref >60)
Glucose, Bld: 103 mg/dL — ABNORMAL HIGH (ref 70–99)
Potassium: 4.6 mmol/L (ref 3.5–5.1)
Sodium: 132 mmol/L — ABNORMAL LOW (ref 135–145)

## 2021-03-10 LAB — TROPONIN I (HIGH SENSITIVITY)
Troponin I (High Sensitivity): 6 ng/L (ref <18)
Troponin I (High Sensitivity): 6 ng/L (ref <18)

## 2021-03-10 MED ORDER — SODIUM CHLORIDE 0.9% FLUSH
3.0000 mL | Freq: Two times a day (BID) | INTRAVENOUS | Status: DC
  Administered 2021-03-10 – 2021-03-14 (×8): 3 mL via INTRAVENOUS

## 2021-03-10 MED ORDER — SODIUM CHLORIDE 0.9 % IV SOLN: 250.0000 mL | INTRAVENOUS | Status: DC | PRN

## 2021-03-10 MED ORDER — IOHEXOL 350 MG/ML SOLN
100.0000 mL | Freq: Once | INTRAVENOUS | Status: AC | PRN
  Administered 2021-03-10: 100 mL via INTRAVENOUS

## 2021-03-10 MED ORDER — METOPROLOL TARTRATE 50 MG PO TABS
50.0000 mg | ORAL_TABLET | Freq: Two times a day (BID) | ORAL | Status: DC
  Administered 2021-03-10 – 2021-03-14 (×8): 50 mg via ORAL
  Filled 2021-03-10 (×8): qty 1

## 2021-03-10 MED ORDER — METHOCARBAMOL 500 MG PO TABS
750.0000 mg | ORAL_TABLET | Freq: Once | ORAL | Status: AC
  Administered 2021-03-10: 750 mg via ORAL
  Filled 2021-03-10: qty 2

## 2021-03-10 MED ORDER — MAGNESIUM OXIDE -MG SUPPLEMENT 400 (240 MG) MG PO TABS
400.0000 mg | ORAL_TABLET | Freq: Every day | ORAL | Status: DC
  Administered 2021-03-11 – 2021-03-14 (×4): 400 mg via ORAL
  Filled 2021-03-10 (×4): qty 1

## 2021-03-10 MED ORDER — SODIUM CHLORIDE 0.9% FLUSH: 3.0000 mL | INTRAVENOUS | Status: DC | PRN

## 2021-03-10 MED ORDER — SIMVASTATIN 20 MG PO TABS
20.0000 mg | ORAL_TABLET | Freq: Every day | ORAL | Status: DC
  Administered 2021-03-11 – 2021-03-14 (×4): 20 mg via ORAL
  Filled 2021-03-10 (×4): qty 1

## 2021-03-10 MED ORDER — ACETAMINOPHEN 325 MG PO TABS
650.0000 mg | ORAL_TABLET | Freq: Four times a day (QID) | ORAL | Status: DC | PRN
  Administered 2021-03-10 – 2021-03-14 (×7): 650 mg via ORAL
  Filled 2021-03-10 (×7): qty 2

## 2021-03-10 MED ORDER — BENAZEPRIL HCL 5 MG PO TABS
10.0000 mg | ORAL_TABLET | Freq: Two times a day (BID) | ORAL | Status: DC
  Administered 2021-03-10: 10 mg via ORAL
  Filled 2021-03-10 (×2): qty 2

## 2021-03-10 MED ORDER — MAGNESIUM 400 MG PO CAPS: 400.0000 mg | ORAL_CAPSULE | ORAL | Status: DC

## 2021-03-10 MED ORDER — ACETAMINOPHEN 650 MG RE SUPP: 650.0000 mg | Freq: Four times a day (QID) | RECTAL | Status: DC | PRN

## 2021-03-10 MED ORDER — FUROSEMIDE 10 MG/ML IJ SOLN
40.0000 mg | Freq: Once | INTRAMUSCULAR | Status: AC
  Administered 2021-03-10: 40 mg via INTRAVENOUS
  Filled 2021-03-10: qty 4

## 2021-03-10 NOTE — ED Provider Notes (Signed)
[facility] EMERGENCY DEPARTMENT Provider Note   CSN: [id number] Arrival date & time: 03/10/21  8657     History Chief Complaint  Patient presents with   Shortness of Breath    Gail Fitzgerald is a 86 y.o. female.  Pt presents to the ED today with sob.  The pt speaks [place] and her daughter translates.  Pt has been having left sided cp and sob for a month.  Last night was a lot worse.  She had a hard time sleeping due to sx.  No hx of heart problems, but she does have htn and high cholesterol.      Past Medical History:  Diagnosis Date   Hypertension     Patient Active Problem List   Diagnosis Date Noted   Pleural effusion 03/10/2021     History reviewed. No pertinent surgical history.   OB History   No obstetric history on file.     No family history on file.  Social History   Tobacco Use   Smoking status: Never   Smokeless tobacco: Never  Substance Use Topics   Alcohol use: Not Currently   Drug use: Not Currently    Home Medications Prior to Admission medications   Not on File    Allergies    Patient has no allergy information on record.  Review of Systems   Review of Systems  Respiratory:  Positive for shortness of breath.   Cardiovascular:  Positive for chest pain.  All other systems reviewed and are negative.  Physical Exam Updated Vital Signs BP (!) 152/84   Pulse 66   Temp 98 F (36.7 C) (Oral)   Resp (!) 24   SpO2 97%   Physical Exam Vitals and nursing note reviewed.  Constitutional:      Appearance: She is well-developed.  HENT:     Head: Normocephalic and atraumatic.     Mouth/Throat:     Mouth: Mucous membranes are moist.  Eyes:     Pupils: Pupils are equal, round, and reactive to light.  Cardiovascular:     Rate and Rhythm: Normal rate and regular rhythm.  Pulmonary:     Effort: Tachypnea present.     Breath sounds: Examination of the left-lower field reveals decreased breath sounds. Decreased  breath sounds present.  Abdominal:     General: Bowel sounds are normal.     Palpations: Abdomen is soft.  Musculoskeletal:        General: Normal range of motion.     Cervical back: Normal range of motion and neck supple.  Skin:    General: Skin is warm and dry.     Capillary Refill: Capillary refill takes less than 2 seconds.  Neurological:     General: No focal deficit present.     Mental Status: She is alert and oriented to person, place, and time.  Psychiatric:        Mood and Affect: Mood normal.        Behavior: Behavior normal.    ED Results / Procedures / Treatments   Labs (all labs ordered are listed, but only abnormal results are displayed) Labs Reviewed  BASIC METABOLIC PANEL - Abnormal; Notable for the following components:      Result Value   Sodium 132 (*)    Glucose, Bld 103 (*)    GFR, Estimated 56 (*)    All other components within normal limits  RESP PANEL BY RT-PCR (FLU A&B, COVID) ARPGX2  CBC  HEPATIC  FUNCTION PANEL  PROTEIN / CREATININE RATIO, URINE  TROPONIN I (HIGH SENSITIVITY)  TROPONIN I (HIGH SENSITIVITY)    EKG EKG Interpretation  Date/Time:  Saturday March 10 2021 08:50:40 EDT Ventricular Rate:  76 PR Interval:    QRS Duration: 204 QT Interval:  548 QTC Calculation: 616 R Axis:   -45 Text Interpretation: Normal sinus rhythm Left axis deviation Right bundle branch block Possible Lateral infarct , age undetermined Abnormal ECG Reconfirmed by Isla Pence [phone]) on 03/10/2021 10:38:21 AM  Radiology DG Chest 2 View  Result Date: 03/10/2021 CLINICAL DATA:  Chest pain, shortness of breath. EXAM: CHEST - 2 VIEW COMPARISON:  None. FINDINGS: Stable cardiomegaly. No pneumothorax is noted. Right lung is clear. Moderate left pleural effusion is noted with probable underlying atelectasis or infiltrate. Bony thorax is unremarkable. IMPRESSION: Moderate size left pleural effusion with associated left basilar atelectasis or infiltrate. Aortic  Atherosclerosis (ICD10-I70.0). Electronically Signed   By: Marijo Conception M.D.   On: 03/10/2021 09:21   CT Angio Chest Pulmonary Embolism (PE) W or WO Contrast  Result Date: 03/10/2021 CLINICAL DATA:  Chest pain, SOB.  Rule out PE. EXAM: CT ANGIOGRAPHY CHEST WITH CONTRAST TECHNIQUE: Multidetector CT imaging of the chest was performed using the standard protocol during bolus administration of intravenous contrast. Multiplanar CT image reconstructions and MIPs were obtained to evaluate the vascular anatomy. CONTRAST:  163mL OMNIPAQUE IOHEXOL 350 MG/ML SOLN COMPARISON:  None. FINDINGS: Cardiovascular: Satisfactory opacification of the pulmonary arteries to the segmental level. No evidence of pulmonary embolism. Normal heart size. No pericardial effusion. Aortic atherosclerosis. Increase caliber the ascending thoracic aorta measures 4.1 cm, image 59/7. Coronary artery calcifications. No pericardial effusion. Mediastinum/Nodes: Normal appearance of the thyroid gland. The trachea appears patent and is midline. Normal appearance of the esophagus. No enlarged lymph nodes. Lungs/Pleura: There is a moderate to large left pleural effusion. This appears partially loculated with fluid along the oblique fissure and fluid extending over the posterior and apical portions of the left upper lobe. Passive atelectasis of the left lower lobe is identified. Complete atelectasis of the lingula is identified. There is mild pleural thickening overlying the medial right upper lobe measuring up to 6 mm in thickness. Pleural nodule overlying the anterior basal right upper lobe measures 2.2 by 0.8 cm. Right lung appears clear. No right pleural effusion identified. Upper Abdomen: No acute abnormality within the limited imaging of the upper abdomen. Small hiatal hernia noted. Musculoskeletal: The bones appear diffusely osteopenic. No acute or suspicious osseous findings. Review of the MIP images confirms the above findings. IMPRESSION: 1. No  evidence for acute pulmonary embolus. 2. Moderate to large left pleural effusion appears partially loculated. Areas of pleural thickening and nodularity are identified overlying the left upper lobe as described. Cannot exclude malignant pleural effusion. Further evaluation with diagnostic thoracentesis is advised. 3. Complete atelectasis of the lingula is identified. Although no obvious central obstructing masses identified underlying malignancy is not excluded and follow-up imaging is advised to ensure re-expansion the lingula and to exclude underlying obstructing lesion. 4. Coronary artery calcifications noted. 5. Increase caliber of the ascending thoracic aorta measuring 4.1 cm. Recommend annual imaging followup by CTA or MRA. This recommendation follows 2010 ACCF/AHA/AATS/ACR/ASA/SCA/SCAI/SIR/STS/SVM Guidelines for the Diagnosis and Management of Patients with Thoracic Aortic Disease. Circulation. 2010; 121: G387-F643. Aortic aneurysm NOS (ICD10-I71.9) Aortic Atherosclerosis (ICD10-I70.0). Electronically Signed   By: Kerby Moors M.D.   On: 03/10/2021 14:39    Procedures Procedures   Medications Ordered in ED Medications  furosemide (LASIX) injection 40 mg (40 mg Intravenous Given 03/10/21 1052)  iohexol (OMNIPAQUE) 350 MG/ML injection 100 mL (100 mLs Intravenous Contrast Given 03/10/21 1333)    ED Course  I have reviewed the triage vital signs and the nursing notes.  Pertinent labs & imaging results that were available during my care of the patient were reviewed by me and considered in my medical decision making (see chart for details).    MDM Rules/Calculators/A&P                          Pt has a large pleural effusion and is symptomatic from it.  She is d/w IR.  They can't do it today, but can drain it tomorrow.    Pt d/w Dr. Jamse Arn ([facility]) who will admit.   Final Clinical Impression(s) / ED Diagnoses Final diagnoses:  Pleural effusion on left    Rx / DC Orders ED Discharge  Orders     None        Isla Pence, MD 03/10/21 1520

## 2021-03-10 NOTE — ED Triage Notes (Signed)
[place] interpreter used via [employer] for triage.  Pt reports intermittent shooting/stabbing pain to L chest and L shoulder blade x 1 month.    Reports back pain for a few weeks that she relates to gardening.  C/o SOB since last night.  Denies pain at present.

## 2021-03-10 NOTE — Plan of Care (Signed)
  Problem: Education: Goal: Knowledge of General Education information will improve Description: Including pain rating scale, medication(s)/side effects and non-pharmacologic comfort measures Outcome: Progressing   Problem: Clinical Measurements: Goal: Ability to maintain clinical measurements within normal limits will improve Outcome: Progressing   Problem: Clinical Measurements: Goal: Diagnostic test results will improve Outcome: Progressing   Problem: Activity: Goal: Risk for activity intolerance will decrease Outcome: Progressing   Problem: Nutrition: Goal: Adequate nutrition will be maintained Outcome: Progressing   Problem: Clinical Measurements: Goal: Respiratory complications will improve Outcome: Progressing   Problem: Pain Managment: Goal: General experience of comfort will improve Outcome: Progressing

## 2021-03-10 NOTE — H&P (Addendum)
History and Physical:    Gail Fitzgerald   UQJ:[id number] DOB: 01/31/1935 DOA: 03/10/2021  Referring MD/provider: Dr. Gilford Raid PCP: System, Provider Not In   Patient coming from: Home  Chief Complaint: Chest pain with radiation to the back x6 to 8 weeks.  Patient seen in conjunction with her son who provided translation.  History of Present Illness:   Gail Fitzgerald is an 86 y.o. female with PMH significant for HTN who was in her usual state of good health until 2 months ago when she started having back pain.  She initially thought she had pulled a muscle during her usual walks and during gardening.  Back pain persisted and eventually became associated with anterior chest pain.  She started feeling that her chest pain was like a knife that went through to her back.  This would come and go with no clear regularity.  It was not positional.  It is not always exertional.  This morning patient developed shortness of breath which really frightened her.  Also noted that last night her pain was so bad that she was unable to sleep.  She was worried about the persistence of her pain, that it had gotten worse and her difficulty breathing so she came to the ED.  Patient denies fevers or chills.  She denies cough.  She has had only 4 pounds of weight loss over the past 2 years.  She is generally very active and as of yesterday was able to walk up and down stairs without too much difficulty although slower than she had been prior.  No hemoptysis.  No nausea vomiting.  Patient denies any history of cancer in the past.  It is unclear whether she has had routine cancer screening or not.  She moved to the [place] 2 years ago.  Now lives with her son and daughter-in-law.  ED Course:  The patient was noted to be afebrile, hypertensive and to have no oxygen requirement upon evaluation.  Patient was comfortable at rest.  CTA shows negative PE however does show a moderately large loculated left-sided pleural effusion  where in malignancy could not be excluded.  Plan was for outpatient work-up however patient became very tachycardic and tachypneic with exertion although she had no hypoxia.  Patient is now admitted for thoracentesis tomorrow.  Given no fever, leukocytosis, fevers or chills, no antibiotics were started.  ROS:   ROS   Review of Systems: General: Denies fever, chills, malaise,  Eyes: Denies recent change in vision, no discharge, redness, pain noted Endocrine: Denies heat/cold intolerance, polyuria or weight loss. Respiratory: Denies cough, SOB at rest or hemoptysis Cardiovascular: Denies chest pain or palpitations GI: Denies nausea, vomiting, diarrhea or constipation GU: Denies dysuria, frequency or hematuria CNS: Denies HA, dizziness, confusion, new weakness or clumsiness. Blood/lymphatics: Denies easy bruising or bleeding Mood/affect: Denies anxiety/depression    Past Medical History:   Past Medical History:  Diagnosis Date   Hypertension     Past Surgical History:   History reviewed. No pertinent surgical history.  Social History:   Social History   Socioeconomic History   Marital status: Widowed    Spouse name: Not on file   Number of children: Not on file   Years of education: Not on file   Highest education level: Not on file  Occupational History   Not on file  Tobacco Use   Smoking status: Never   Smokeless tobacco: Never  Substance and Sexual Activity   Alcohol use: Not Currently  Drug use: Not Currently   Sexual activity: Not on file  Other Topics Concern   Not on file  Social History Narrative   Not on file   Social Determinants of Health   Financial Resource Strain: Not on file  Food Insecurity: Not on file  Transportation Needs: Not on file  Physical Activity: Not on file  Stress: Not on file  Social Connections: Not on file  Intimate Partner Violence: Not on file    Allergies   Patient has no allergy information on record.  Family  history:   No family history on file.  Current Medications:   Prior to Admission medications   Not on File    Physical Exam:   Vitals:   03/10/21 1200 03/10/21 1300 03/10/21 1427 03/10/21 1500  BP: 135/83 (!) 151/70 (!) 160/81 (!) 152/84  Pulse: 60 61 66 66  Resp: 17 16 16  (!) 24  Temp:      TempSrc:      SpO2: 97% 97% 98% 97%     Physical Exam: Blood pressure (!) 152/84, pulse 66, temperature 98 F (36.7 C), temperature source Oral, resp. rate (!) 24, SpO2 97 %. Gen: Somewhat anxious appearing female lying in bed with attentive son at bedside. Eyes: sclera anicteric, conjuctiva mildly injected bilaterally CVS: S1-S2, regulary, no gallops Breasts: No notable masses on exam.  Skin appears normal.  No nipple retraction. Respiratory:  decreased air entry on left side with occasional rales.  Reasonable air entry on right.  No wheezes. GI: NABS, soft, NT, liver edge is palpable but not nodular.  Not tender. LE: No edema. No cyanosis Neuro: A/O x 3, Moving all extremities equally with normal strength, CN 3-12 intact, grossly nonfocal.  Psych: patient is logical and coherent, judgement and insight appear normal, mood and affect appropriate to situation.   Data Review:    Labs: Basic Metabolic Panel: Recent Labs  Lab 03/10/21 0900  NA 132*  K 4.6  CL 98  CO2 25  GLUCOSE 103*  BUN 20  CREATININE 1.00  CALCIUM 8.9   Liver Function Tests: No results for input(s): AST, ALT, ALKPHOS, BILITOT, PROT, ALBUMIN in the last 168 hours. No results for input(s): LIPASE, AMYLASE in the last 168 hours. No results for input(s): AMMONIA in the last 168 hours. CBC: Recent Labs  Lab 03/10/21 0900  WBC 9.9  HGB 12.6  HCT 39.8  MCV 89.4  PLT 238   Cardiac Enzymes: No results for input(s): CKTOTAL, CKMB, CKMBINDEX, TROPONINI in the last 168 hours.  BNP (last 3 results) No results for input(s): PROBNP in the last 8760 hours. CBG: No results for input(s): GLUCAP in the last  168 hours.  Urinalysis No results found for: COLORURINE, APPEARANCEUR, LABSPEC, PHURINE, GLUCOSEU, HGBUR, BILIRUBINUR, KETONESUR, PROTEINUR, UROBILINOGEN, NITRITE, LEUKOCYTESUR    Radiographic Studies: DG Chest 2 View  Result Date: 03/10/2021 CLINICAL DATA:  Chest pain, shortness of breath. EXAM: CHEST - 2 VIEW COMPARISON:  None. FINDINGS: Stable cardiomegaly. No pneumothorax is noted. Right lung is clear. Moderate left pleural effusion is noted with probable underlying atelectasis or infiltrate. Bony thorax is unremarkable. IMPRESSION: Moderate size left pleural effusion with associated left basilar atelectasis or infiltrate. Aortic Atherosclerosis (ICD10-I70.0). Electronically Signed   By: Marijo Conception M.D.   On: 03/10/2021 09:21   CT Angio Chest Pulmonary Embolism (PE) W or WO Contrast  Result Date: 03/10/2021 CLINICAL DATA:  Chest pain, SOB.  Rule out PE. EXAM: CT ANGIOGRAPHY CHEST WITH CONTRAST TECHNIQUE:  Multidetector CT imaging of the chest was performed using the standard protocol during bolus administration of intravenous contrast. Multiplanar CT image reconstructions and MIPs were obtained to evaluate the vascular anatomy. CONTRAST:  17mL OMNIPAQUE IOHEXOL 350 MG/ML SOLN COMPARISON:  None. FINDINGS: Cardiovascular: Satisfactory opacification of the pulmonary arteries to the segmental level. No evidence of pulmonary embolism. Normal heart size. No pericardial effusion. Aortic atherosclerosis. Increase caliber the ascending thoracic aorta measures 4.1 cm, image 59/7. Coronary artery calcifications. No pericardial effusion. Mediastinum/Nodes: Normal appearance of the thyroid gland. The trachea appears patent and is midline. Normal appearance of the esophagus. No enlarged lymph nodes. Lungs/Pleura: There is a moderate to large left pleural effusion. This appears partially loculated with fluid along the oblique fissure and fluid extending over the posterior and apical portions of the left upper  lobe. Passive atelectasis of the left lower lobe is identified. Complete atelectasis of the lingula is identified. There is mild pleural thickening overlying the medial right upper lobe measuring up to 6 mm in thickness. Pleural nodule overlying the anterior basal right upper lobe measures 2.2 by 0.8 cm. Right lung appears clear. No right pleural effusion identified. Upper Abdomen: No acute abnormality within the limited imaging of the upper abdomen. Small hiatal hernia noted. Musculoskeletal: The bones appear diffusely osteopenic. No acute or suspicious osseous findings. Review of the MIP images confirms the above findings. IMPRESSION: 1. No evidence for acute pulmonary embolus. 2. Moderate to large left pleural effusion appears partially loculated. Areas of pleural thickening and nodularity are identified overlying the left upper lobe as described. Cannot exclude malignant pleural effusion. Further evaluation with diagnostic thoracentesis is advised. 3. Complete atelectasis of the lingula is identified. Although no obvious central obstructing masses identified underlying malignancy is not excluded and follow-up imaging is advised to ensure re-expansion the lingula and to exclude underlying obstructing lesion. 4. Coronary artery calcifications noted. 5. Increase caliber of the ascending thoracic aorta measuring 4.1 cm. Recommend annual imaging followup by CTA or MRA. This recommendation follows 2010 ACCF/AHA/AATS/ACR/ASA/SCA/SCAI/SIR/STS/SVM Guidelines for the Diagnosis and Management of Patients with Thoracic Aortic Disease. Circulation. 2010; 121: S283-T517. Aortic aneurysm NOS (ICD10-I71.9) Aortic Atherosclerosis (ICD10-I70.0). Electronically Signed   By: Kerby Moors M.D.   On: 03/10/2021 14:39    EKG: Independently reviewed.  Sinus rhythm at 76.  Normal intervals.  LAD at -66.  Q waves in 3 and F.  Poor R wave progression.  No acute ST-T wave changes.  Decreased voltage throughout but especially in V1 and  V2.   Assessment/Plan:   Active Problems:   Pleural effusion  86 year old female with hypertension presents with new loculated pleural effusion and shortness of breath.  Moderately large loculated pleural effusion ED physician spoke with IR who noted they could do thoracentesis tomorrow Thoracentesis with appropriate labs ordered As noted above, no fevers leukocytosis or chills so no antibiotics were ordered Will order echocardiogram Protein creatinine ratio ordered although patient has no peripheral edema  HTN Continue benazepril and metoprolol Patient is on aspirin at home--I have held this today given thoracentesis in the morning This can be restarted as warranted  Dyslipidemia Continue simvastatin     Other information:   DVT prophylaxis: SCD ordered. Code Status: Full Family Communication: Patient son and daughter-in-law were at bedside Disposition Plan: Home Consults called: IR Admission status: Observation  Marixa Mellott Tublu Mahdi Frye [facility]  If 7PM-7AM, please contact night-coverage [email or web address]

## 2021-03-10 NOTE — ED Notes (Signed)
Patient transported to CT 

## 2021-03-11 ENCOUNTER — Observation Stay (HOSPITAL_COMMUNITY): Payer: Medicaid Other

## 2021-03-11 DIAGNOSIS — J90 Pleural effusion, not elsewhere classified: Secondary | ICD-10-CM | POA: Diagnosis not present

## 2021-03-11 DIAGNOSIS — I5031 Acute diastolic (congestive) heart failure: Secondary | ICD-10-CM | POA: Diagnosis not present

## 2021-03-11 LAB — BODY FLUID CELL COUNT WITH DIFFERENTIAL
Eos, Fluid: 0 %
Lymphs, Fluid: 50 %
Monocyte-Macrophage-Serous Fluid: 7 % — ABNORMAL LOW (ref 50–90)
Neutrophil Count, Fluid: 43 % — ABNORMAL HIGH (ref 0–25)
Total Nucleated Cell Count, Fluid: 2240 cu mm — ABNORMAL HIGH (ref 0–1000)

## 2021-03-11 LAB — LACTATE DEHYDROGENASE
LDH: 135 U/L (ref 98–192)
LDH: 137 U/L (ref 98–192)

## 2021-03-11 LAB — APTT: aPTT: 28 seconds (ref 24–36)

## 2021-03-11 LAB — CBC
HCT: 40.6 % (ref 36.0–46.0)
Hemoglobin: 13.6 g/dL (ref 12.0–15.0)
MCH: 29.1 pg (ref 26.0–34.0)
MCHC: 33.5 g/dL (ref 30.0–36.0)
MCV: 86.9 fL (ref 80.0–100.0)
Platelets: 224 10*3/uL (ref 150–400)
RBC: 4.67 MIL/uL (ref 3.87–5.11)
RDW: 13.2 % (ref 11.5–15.5)
WBC: 10.8 10*3/uL — ABNORMAL HIGH (ref 4.0–10.5)
nRBC: 0 % (ref 0.0–0.2)

## 2021-03-11 LAB — COMPREHENSIVE METABOLIC PANEL
ALT: 9 U/L (ref 0–44)
AST: 20 U/L (ref 15–41)
Albumin: 3.7 g/dL (ref 3.5–5.0)
Alkaline Phosphatase: 88 U/L (ref 38–126)
Anion gap: 13 (ref 5–15)
BUN: 21 mg/dL (ref 8–23)
CO2: 25 mmol/L (ref 22–32)
Calcium: 9.3 mg/dL (ref 8.9–10.3)
Chloride: 95 mmol/L — ABNORMAL LOW (ref 98–111)
Creatinine, Ser: 1.11 mg/dL — ABNORMAL HIGH (ref 0.44–1.00)
GFR, Estimated: 49 mL/min — ABNORMAL LOW (ref >60)
Glucose, Bld: 98 mg/dL (ref 70–99)
Potassium: 5 mmol/L (ref 3.5–5.1)
Sodium: 133 mmol/L — ABNORMAL LOW (ref 135–145)
Total Bilirubin: 1 mg/dL (ref 0.3–1.2)
Total Protein: 7 g/dL (ref 6.5–8.1)

## 2021-03-11 LAB — GLUCOSE, PLEURAL OR PERITONEAL FLUID: Glucose, Fluid: 108 mg/dL

## 2021-03-11 LAB — LACTATE DEHYDROGENASE, PLEURAL OR PERITONEAL FLUID: LD, Fluid: 164 U/L — ABNORMAL HIGH (ref 3–23)

## 2021-03-11 LAB — PROTIME-INR
INR: 1.1 (ref 0.8–1.2)
Prothrombin Time: 14.2 seconds (ref 11.4–15.2)

## 2021-03-11 LAB — PROTEIN, PLEURAL OR PERITONEAL FLUID: Total protein, fluid: 5.3 g/dL

## 2021-03-11 LAB — ECHOCARDIOGRAM COMPLETE
Area-P 1/2: 2.5 cm2
S' Lateral: 2.3 cm

## 2021-03-11 MED ORDER — AZITHROMYCIN 500 MG PO TABS
500.0000 mg | ORAL_TABLET | Freq: Every day | ORAL | Status: DC
  Administered 2021-03-12 – 2021-03-14 (×3): 500 mg via ORAL
  Filled 2021-03-11 (×3): qty 1

## 2021-03-11 MED ORDER — IBUPROFEN 200 MG PO TABS: 400.0000 mg | ORAL_TABLET | Freq: Four times a day (QID) | ORAL | Status: DC | PRN

## 2021-03-11 MED ORDER — SODIUM CHLORIDE 0.9 % IV SOLN
500.0000 mg | INTRAVENOUS | Status: DC
  Administered 2021-03-11: 500 mg via INTRAVENOUS
  Filled 2021-03-11: qty 500

## 2021-03-11 MED ORDER — HYDRALAZINE HCL 10 MG PO TABS: 10.0000 mg | ORAL_TABLET | Freq: Four times a day (QID) | ORAL | Status: DC | PRN

## 2021-03-11 MED ORDER — SODIUM CHLORIDE 0.9 % IV SOLN
2.0000 g | INTRAVENOUS | Status: DC
  Administered 2021-03-11 – 2021-03-14 (×4): 2 g via INTRAVENOUS
  Filled 2021-03-11 (×3): qty 20
  Filled 2021-03-11: qty 2

## 2021-03-11 NOTE — Progress Notes (Signed)
PROGRESS NOTE    Gail Fitzgerald  FUX:[id number] DOB: 06/02/1935 DOA: 03/10/2021 PCP: System, Provider Not In   Brief Narrative: 86 year old with past medical history significant for hypertension presents complaining of back pain that is started 60-month prior to admission.  Subsequently her back pain progressed to chest pain.  The morning of admission she developed shortness of breath.  She presents to the ED for further evaluation.  Evaluation in the ED: CTA showed negative PE, moderately large loculated left-sided pleural effusion, malignancy could not be excluded.  Patient was admitted for further evaluation.   Assessment & Plan:   Active Problems:   Pleural effusion  1-Moderately Large Left partially Loculated Pleural Effusion:  -Underwent thoracentesis 7/03:  -Pulmonary consulted.  -Pleural fluid: WBC 2240, neutrophil 43, LDH 164, Protein 5.3.  -Discussed with Dr Concepcion Living, plan to start IV antibiotics. Follow cytology. Might need repeat chest x ray.  -CT chest: Interval decreasing complex left pleural fluid collection with persistent collapse/consolidative opacity in the lingula and left lower lobe.  Given unilateral disease, malignancy remains a concern. -ECHO normal Ef, diastolic dysfunction grade 1.   2-HTN:  Hold lisinopril due to elevated potasium.  Continue with metoprolol.  SBP 130 range.  PRN hydralazine.   3-Dyslipidemia:  Continue with statins.   4-4.3 cm diameter ascending thoracic aorta: Will need follow-up     There is no height or weight on file to calculate BMI.   DVT prophylaxis: SCD Code Status: Full Code Family Communication: daughter in law at bedside, in the afternoon, son  Disposition Plan:  Status is: Observation  The patient remains OBS appropriate and will d/c before 2 midnights.  Dispo: The patient is from: Home              Anticipated d/c is to: Home              Patient currently is not medically stable to d/c.   Difficult to place  patient No        Consultants:  IR   Procedures:  Thoracentesis.   Antimicrobials:    Subjective: She report back /chest pain. Also SOB on exertion.   Objective: Vitals:   03/10/21 1933 03/10/21 2116 03/10/21 2333 03/11/21 0400  BP: 132/77 103/67 (!) 113/59 (!) 188/55  Pulse: 81 78 (!) 54 (!) 57  Resp: 19  20 20   Temp: 98.7 F (37.1 C)  97.7 F (36.5 C) 98.2 F (36.8 C)  TempSrc: Oral  Oral Oral  SpO2: 95%  96% 98%    Intake/Output Summary (Last 24 hours) at 03/11/2021 0719 Last data filed at 03/10/2021 1933 Gross per 24 hour  Intake 240 ml  Output --  Net 240 ml   There were no vitals filed for this visit.  Examination:  General exam: Appears calm and comfortable  Respiratory system: Left side crackles.  Cardiovascular system: S1 & S2 heard, RRR. No JVD, murmurs, rubs, gallops or clicks. No pedal edema. Gastrointestinal system: Abdomen is nondistended, soft and nontender. No organomegaly or masses felt. Normal bowel sounds heard. Central nervous system: Alert and oriented.  Extremities: Symmetric 5 x 5 power.   Data Reviewed: I have personally reviewed following labs and imaging studies  CBC: Recent Labs  Lab 03/10/21 0900 03/11/21 0151  WBC 9.9 10.8*  HGB 12.6 13.6  HCT 39.8 40.6  MCV 89.4 86.9  PLT 238 025   Basic Metabolic Panel: Recent Labs  Lab 03/10/21 0900 03/11/21 0151  NA 132* 133*  K 4.6 5.0  CL 98 95*  CO2 25 25  GLUCOSE 103* 98  BUN 20 21  CREATININE 1.00 1.11*  CALCIUM 8.9 9.3   GFR: CrCl cannot be calculated (Unknown ideal weight.). Liver Function Tests: Recent Labs  Lab 03/10/21 1849 03/11/21 0151  AST 20 20  ALT 9 9  ALKPHOS 91 88  BILITOT 1.2 1.0  PROT 7.9 7.0  ALBUMIN 4.1 3.7   No results for input(s): LIPASE, AMYLASE in the last 168 hours. No results for input(s): AMMONIA in the last 168 hours. Coagulation Profile: Recent Labs  Lab 03/11/21 0151  INR 1.1   Cardiac Enzymes: No results for input(s):  CKTOTAL, CKMB, CKMBINDEX, TROPONINI in the last 168 hours. BNP (last 3 results) No results for input(s): PROBNP in the last 8760 hours. HbA1C: No results for input(s): HGBA1C in the last 72 hours. CBG: No results for input(s): GLUCAP in the last 168 hours. Lipid Profile: No results for input(s): CHOL, HDL, LDLCALC, TRIG, CHOLHDL, LDLDIRECT in the last 72 hours. Thyroid Function Tests: No results for input(s): TSH, T4TOTAL, FREET4, T3FREE, THYROIDAB in the last 72 hours. Anemia Panel: No results for input(s): VITAMINB12, FOLATE, FERRITIN, TIBC, IRON, RETICCTPCT in the last 72 hours. Sepsis Labs: No results for input(s): PROCALCITON, LATICACIDVEN in the last 168 hours.  Recent Results (from the past 240 hour(s))  Resp Panel by RT-PCR (Flu A&B, Covid) Nasopharyngeal Swab     Status: None   Collection Time: 03/10/21 10:50 AM   Specimen: Nasopharyngeal Swab; Nasopharyngeal(NP) swabs in vial transport medium  Result Value Ref Range Status   SARS Coronavirus 2 by RT PCR NEGATIVE NEGATIVE Final    Comment: (NOTE) SARS-CoV-2 target nucleic acids are NOT DETECTED.  The SARS-CoV-2 RNA is generally detectable in upper respiratory specimens during the acute phase of infection. The lowest concentration of SARS-CoV-2 viral copies this assay can detect is 138 copies/mL. A negative result does not preclude SARS-Cov-2 infection and should not be used as the sole basis for treatment or other patient management decisions. A negative result may occur with  improper specimen collection/handling, submission of specimen other than nasopharyngeal swab, presence of viral mutation(s) within the areas targeted by this assay, and inadequate number of viral copies(<138 copies/mL). A negative result must be combined with clinical observations, patient history, and epidemiological information. The expected result is Negative.  Fact Sheet for Patients:  EntrepreneurPulse.com.au  Fact Sheet  for Healthcare Providers:  IncredibleEmployment.be  This test is no t yet approved or cleared by the [place] FDA and  has been authorized for detection and/or diagnosis of SARS-CoV-2 by FDA under an Emergency Use Authorization (EUA). This EUA will remain  in effect (meaning this test can be used) for the duration of the COVID-19 declaration under Section 564(b)(1) of the Act, 21 U.S.C.section 360bbb-3(b)(1), unless the authorization is terminated  or revoked sooner.       Influenza A by PCR NEGATIVE NEGATIVE Final   Influenza B by PCR NEGATIVE NEGATIVE Final    Comment: (NOTE) The Xpert Xpress SARS-CoV-2/FLU/RSV plus assay is intended as an aid in the diagnosis of influenza from Nasopharyngeal swab specimens and should not be used as a sole basis for treatment. Nasal washings and aspirates are unacceptable for Xpert Xpress SARS-CoV-2/FLU/RSV testing.  Fact Sheet for Patients: EntrepreneurPulse.com.au  Fact Sheet for Healthcare Providers: IncredibleEmployment.be  This test is not yet approved or cleared by the [place] FDA and has been authorized for detection and/or diagnosis of SARS-CoV-2 by FDA under an Emergency Use  Authorization (EUA). This EUA will remain in effect (meaning this test can be used) for the duration of the COVID-19 declaration under Section 564(b)(1) of the Act, 21 U.S.C. section 360bbb-3(b)(1), unless the authorization is terminated or revoked.  Performed at [facility], [place] [address], [place], [place] [address]          Radiology Studies: DG Chest 2 View  Result Date: 03/10/2021 CLINICAL DATA:  Chest pain, shortness of breath. EXAM: CHEST - 2 VIEW COMPARISON:  None. FINDINGS: Stable cardiomegaly. No pneumothorax is noted. Right lung is clear. Moderate left pleural effusion is noted with probable underlying atelectasis or infiltrate. Bony thorax is unremarkable. IMPRESSION:  Moderate size left pleural effusion with associated left basilar atelectasis or infiltrate. Aortic Atherosclerosis (ICD10-I70.0). Electronically Signed   By: Marijo Conception M.D.   On: 03/10/2021 09:21   CT Angio Chest Pulmonary Embolism (PE) W or WO Contrast  Result Date: 03/10/2021 CLINICAL DATA:  Chest pain, SOB.  Rule out PE. EXAM: CT ANGIOGRAPHY CHEST WITH CONTRAST TECHNIQUE: Multidetector CT imaging of the chest was performed using the standard protocol during bolus administration of intravenous contrast. Multiplanar CT image reconstructions and MIPs were obtained to evaluate the vascular anatomy. CONTRAST:  140mL OMNIPAQUE IOHEXOL 350 MG/ML SOLN COMPARISON:  None. FINDINGS: Cardiovascular: Satisfactory opacification of the pulmonary arteries to the segmental level. No evidence of pulmonary embolism. Normal heart size. No pericardial effusion. Aortic atherosclerosis. Increase caliber the ascending thoracic aorta measures 4.1 cm, image 59/7. Coronary artery calcifications. No pericardial effusion. Mediastinum/Nodes: Normal appearance of the thyroid gland. The trachea appears patent and is midline. Normal appearance of the esophagus. No enlarged lymph nodes. Lungs/Pleura: There is a moderate to large left pleural effusion. This appears partially loculated with fluid along the oblique fissure and fluid extending over the posterior and apical portions of the left upper lobe. Passive atelectasis of the left lower lobe is identified. Complete atelectasis of the lingula is identified. There is mild pleural thickening overlying the medial right upper lobe measuring up to 6 mm in thickness. Pleural nodule overlying the anterior basal right upper lobe measures 2.2 by 0.8 cm. Right lung appears clear. No right pleural effusion identified. Upper Abdomen: No acute abnormality within the limited imaging of the upper abdomen. Small hiatal hernia noted. Musculoskeletal: The bones appear diffusely osteopenic. No acute or  suspicious osseous findings. Review of the MIP images confirms the above findings. IMPRESSION: 1. No evidence for acute pulmonary embolus. 2. Moderate to large left pleural effusion appears partially loculated. Areas of pleural thickening and nodularity are identified overlying the left upper lobe as described. Cannot exclude malignant pleural effusion. Further evaluation with diagnostic thoracentesis is advised. 3. Complete atelectasis of the lingula is identified. Although no obvious central obstructing masses identified underlying malignancy is not excluded and follow-up imaging is advised to ensure re-expansion the lingula and to exclude underlying obstructing lesion. 4. Coronary artery calcifications noted. 5. Increase caliber of the ascending thoracic aorta measuring 4.1 cm. Recommend annual imaging followup by CTA or MRA. This recommendation follows 2010 ACCF/AHA/AATS/ACR/ASA/SCA/SCAI/SIR/STS/SVM Guidelines for the Diagnosis and Management of Patients with Thoracic Aortic Disease. Circulation. 2010; 121: X735-H299. Aortic aneurysm NOS (ICD10-I71.9) Aortic Atherosclerosis (ICD10-I70.0). Electronically Signed   By: Kerby Moors M.D.   On: 03/10/2021 14:39        Scheduled Meds:  benazepril  10 mg Oral BID   magnesium oxide  400 mg Oral Daily   metoprolol tartrate  50 mg Oral BID   simvastatin  20 mg Oral Daily   sodium chloride flush  3 mL Intravenous Q12H   Continuous Infusions:  sodium chloride       LOS: 0 days    Time spent: 35 minutes.     Elmarie Shiley, MD [facility]   If 7PM-7AM, please contact night-coverage [email or web address]  03/11/2021, 7:19 AM

## 2021-03-11 NOTE — Procedures (Signed)
   US guided left thoracentesis 700 cc dark bloody fluid obtained Sent for labs per MD  Tolerated well  EBL: none  Cxr pending

## 2021-03-11 NOTE — Progress Notes (Addendum)
No telemetry monitoring needed per Dr Regalado--CCMD advised

## 2021-03-11 NOTE — Progress Notes (Signed)
Echocardiogram 2D Echocardiogram has been performed.  Oneal Deputy Florrie Ramires RDCS 03/11/2021, 11:06 AM

## 2021-03-11 NOTE — Consult Note (Signed)
NAME:  Gail Fitzgerald, MRN:  [id number], DOB:  05-May-1935, LOS: 0 ADMISSION DATE:  03/10/2021, CONSULTATION DATE:  03/10/2021 REFERRING MD:  Rudolpho Sevin MD, CHIEF COMPLAINT:  Pleural effusion   History of Present Illness:   86 year old with history of hypertension presenting with back pain.  Found to have a large left pleural effusion with loculation.  Underwent thoracentesis by IR and PCCM consulted for help with management.  Patient is a native of [place].  Visits [place] to be with family.  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  No exposures to chemicals, asbestos or other substances at work or at home  Pertinent  Medical History    has a past medical history of Hypertension.   Significant Hospital Events: Including procedures, antibiotic start and stop dates in addition to other pertinent events   7/3-left thoracentesis, 700 cc dark bloody fluid obtained  Interim History / Subjective:    Objective   Blood pressure (!) 149/66, pulse 77, temperature 98.2 F (36.8 C), temperature source Oral, resp. rate 20, SpO2 93 %.    FiO2 (%):  [21 %] 21 %   Intake/Output Summary (Last 24 hours) at 03/11/2021 1017 Last data filed at 03/11/2021 0730 Gross per 24 hour  Intake 480 ml  Output --  Net 480 ml   There were no vitals filed for this visit.  Examination: Gen:      No acute distress HEENT:  EOMI, sclera anicteric Neck:     No masses; no thyromegaly Lungs:    Clear to auscultation bilaterally; normal respiratory effort CV:         Regular rate and rhythm; no murmurs Abd:      + bowel sounds; soft, non-tender; no palpable masses, no distension Ext:    No edema; adequate peripheral perfusion Skin:      Warm and dry; no rash Neuro: alert and oriented x 3 Psych: normal mood and affect    Resolved Hospital Problem list     Assessment & Plan:  Loculated effusion CT scan reviewed with areas of pleural thickening and nodularity with  atelectasis/consolidation  Follow pleural studies from thoracentesis, follow cytology Repeat CT now that the fluid has been drained to evaluate for underlying malignancy Start 7 days empiric antibiotics for CAP  She can be followed up at her PCP (has appointment for 7/6)  We will also make an appointment at the [facility] office  Best Practice (right click and "Reselect all SmartList Selections" daily)   Per primary team  Labs   CBC: Recent Labs  Lab 03/10/21 0900 03/11/21 0151  WBC 9.9 10.8*  HGB 12.6 13.6  HCT 39.8 40.6  MCV 89.4 86.9  PLT 238 762    Basic Metabolic Panel: Recent Labs  Lab 03/10/21 0900 03/11/21 0151  NA 132* 133*  K 4.6 5.0  CL 98 95*  CO2 25 25  GLUCOSE 103* 98  BUN 20 21  CREATININE 1.00 1.11*  CALCIUM 8.9 9.3   GFR: CrCl cannot be calculated (Unknown ideal weight.). Recent Labs  Lab 03/10/21 0900 03/11/21 0151  WBC 9.9 10.8*    Liver Function Tests: Recent Labs  Lab 03/10/21 1849 03/11/21 0151  AST 20 20  ALT 9 9  ALKPHOS 91 88  BILITOT 1.2 1.0  PROT 7.9 7.0  ALBUMIN 4.1 3.7   No results for input(s): LIPASE, AMYLASE in the last 168 hours. No results for input(s): AMMONIA in the last 168 hours.  ABG No results  found for: PHART, PCO2ART, PO2ART, HCO3, TCO2, ACIDBASEDEF, O2SAT   Coagulation Profile: Recent Labs  Lab 03/11/21 0151  INR 1.1    Cardiac Enzymes: No results for input(s): CKTOTAL, CKMB, CKMBINDEX, TROPONINI in the last 168 hours.  HbA1C: No results found for: HGBA1C  CBG: No results for input(s): GLUCAP in the last 168 hours.  Review of Systems:    REVIEW OF SYSTEMS:   All negative; except for those that are bolded, which indicate positives.  Constitutional: weight loss, weight gain, night sweats, fevers, chills, fatigue, weakness.  HEENT: headaches, sore throat, sneezing, nasal congestion, post nasal drip, difficulty swallowing, tooth/dental problems, visual complaints, visual changes, ear  aches. Neuro: difficulty with speech, weakness, numbness, ataxia. CV:  chest pain, orthopnea, PND, swelling in lower extremities, dizziness, palpitations, syncope.  Resp: cough, hemoptysis, dyspnea, wheezing. GI: heartburn, indigestion, abdominal pain, nausea, vomiting, diarrhea, constipation, change in bowel habits, loss of appetite, hematemesis, melena, hematochezia.  GU: dysuria, change in color of urine, urgency or frequency, flank pain, hematuria. MSK: joint pain or swelling, decreased range of motion. Psych: change in mood or affect, depression, anxiety, suicidal ideations, homicidal ideations. Skin: rash, itching, bruising.   Past Medical History:  She,  has a past medical history of Hypertension.   Surgical History:  History reviewed. No pertinent surgical history.   Social History:   reports that she has never smoked. She has never used smokeless tobacco. She reports previous alcohol use. She reports previous drug use.   Family History:  Her family history is not on file.   Allergies No Known Allergies   Home Medications  Prior to Admission medications   Medication Sig Start Date End Date Taking? Authorizing Provider  acetaminophen (TYLENOL) 500 MG tablet Take 500 mg by mouth every 6 (six) hours as needed for moderate pain.   Yes [provider]  aspirin EC 81 MG tablet Take 81 mg by mouth daily. Swallow whole.   Yes [provider]  benazepril (LOTENSIN) 20 MG tablet Take 10 mg by mouth 2 (two) times daily.   Yes [provider]  ibuprofen (ADVIL) 400 MG tablet Take 400 mg by mouth every 6 (six) hours as needed for mild pain.   Yes [provider]  Magnesium 400 MG CAPS Take 400 mg by mouth See admin instructions. Takes 1 tablet by mouth once daily 10 days on and 10 days off.   Yes [provider]  metoprolol tartrate (LOPRESSOR) 100 MG tablet Take 50 mg by mouth 2 (two) times daily.   Yes [provider]  NON FORMULARY  Take 1 tablet by mouth once as needed (chest pain). Validol - got from [place]   Yes [provider]  NON FORMULARY Take 30 drops by mouth daily as needed (for anxiety as needed at bedtime). Valocordin - got from [place]   Yes [provider]  OVER THE COUNTER MEDICATION Take 1 tablet by mouth daily as needed (for stomach discomfort). Allochol - Multivitamin with : Magnesium, potassium, garlic and charcoal. - got from [place]   Yes [provider]  simvastatin (ZOCOR) 20 MG tablet Take 20 mg by mouth daily.   Yes [provider]     Signature:   Marshell Garfinkel MD [facility] See Amion for pager  If no response to pager , please call [phone] until 7pm After 7:00 pm call Elink  [phone] 03/11/2021, 12:02 PM

## 2021-03-11 NOTE — Plan of Care (Signed)
  Problem: Education: Goal: Knowledge of General Education information will improve Description: Including pain rating scale, medication(s)/side effects and non-pharmacologic comfort measures Outcome: Progressing   Problem: Health Behavior/Discharge Planning: Goal: Ability to manage health-related needs will improve Outcome: Progressing   Problem: Clinical Measurements: Goal: Ability to maintain clinical measurements within normal limits will improve Outcome: Progressing   Problem: Clinical Measurements: Goal: Diagnostic test results will improve Outcome: Progressing   Problem: Clinical Measurements: Goal: Respiratory complications will improve Outcome: Progressing   Problem: Activity: Goal: Risk for activity intolerance will decrease Outcome: Progressing   Problem: Nutrition: Goal: Adequate nutrition will be maintained Outcome: Progressing   Problem: Coping: Goal: Level of anxiety will decrease Outcome: Progressing   Problem: Pain Managment: Goal: General experience of comfort will improve Outcome: Progressing

## 2021-03-12 DIAGNOSIS — Z20822 Contact with and (suspected) exposure to covid-19: Secondary | ICD-10-CM | POA: Diagnosis present

## 2021-03-12 DIAGNOSIS — R000 Tachycardia, unspecified: Secondary | ICD-10-CM | POA: Diagnosis present

## 2021-03-12 DIAGNOSIS — E785 Hyperlipidemia, unspecified: Secondary | ICD-10-CM | POA: Diagnosis present

## 2021-03-12 DIAGNOSIS — E871 Hypo-osmolality and hyponatremia: Secondary | ICD-10-CM | POA: Diagnosis not present

## 2021-03-12 DIAGNOSIS — K521 Toxic gastroenteritis and colitis: Secondary | ICD-10-CM | POA: Diagnosis not present

## 2021-03-12 DIAGNOSIS — R0682 Tachypnea, not elsewhere classified: Secondary | ICD-10-CM | POA: Diagnosis present

## 2021-03-12 DIAGNOSIS — I712 Thoracic aortic aneurysm, without rupture: Secondary | ICD-10-CM | POA: Diagnosis present

## 2021-03-12 DIAGNOSIS — E7800 Pure hypercholesterolemia, unspecified: Secondary | ICD-10-CM | POA: Diagnosis present

## 2021-03-12 DIAGNOSIS — I10 Essential (primary) hypertension: Secondary | ICD-10-CM | POA: Diagnosis present

## 2021-03-12 DIAGNOSIS — I4510 Unspecified right bundle-branch block: Secondary | ICD-10-CM | POA: Diagnosis present

## 2021-03-12 DIAGNOSIS — Y92239 Unspecified place in hospital as the place of occurrence of the external cause: Secondary | ICD-10-CM | POA: Diagnosis not present

## 2021-03-12 DIAGNOSIS — Z7982 Long term (current) use of aspirin: Secondary | ICD-10-CM | POA: Diagnosis not present

## 2021-03-12 DIAGNOSIS — T3695XA Adverse effect of unspecified systemic antibiotic, initial encounter: Secondary | ICD-10-CM | POA: Diagnosis not present

## 2021-03-12 DIAGNOSIS — Z79899 Other long term (current) drug therapy: Secondary | ICD-10-CM | POA: Diagnosis not present

## 2021-03-12 DIAGNOSIS — J90 Pleural effusion, not elsewhere classified: Secondary | ICD-10-CM | POA: Diagnosis not present

## 2021-03-12 DIAGNOSIS — I351 Nonrheumatic aortic (valve) insufficiency: Secondary | ICD-10-CM | POA: Diagnosis present

## 2021-03-12 LAB — BASIC METABOLIC PANEL
Anion gap: 10 (ref 5–15)
BUN: 19 mg/dL (ref 8–23)
CO2: 26 mmol/L (ref 22–32)
Calcium: 8.8 mg/dL — ABNORMAL LOW (ref 8.9–10.3)
Chloride: 98 mmol/L (ref 98–111)
Creatinine, Ser: 1.08 mg/dL — ABNORMAL HIGH (ref 0.44–1.00)
GFR, Estimated: 51 mL/min — ABNORMAL LOW (ref >60)
Glucose, Bld: 101 mg/dL — ABNORMAL HIGH (ref 70–99)
Potassium: 4.2 mmol/L (ref 3.5–5.1)
Sodium: 134 mmol/L — ABNORMAL LOW (ref 135–145)

## 2021-03-12 LAB — CBC
HCT: 39.8 % (ref 36.0–46.0)
Hemoglobin: 12.7 g/dL (ref 12.0–15.0)
MCH: 27.9 pg (ref 26.0–34.0)
MCHC: 31.9 g/dL (ref 30.0–36.0)
MCV: 87.3 fL (ref 80.0–100.0)
Platelets: 242 10*3/uL (ref 150–400)
RBC: 4.56 MIL/uL (ref 3.87–5.11)
RDW: 13.2 % (ref 11.5–15.5)
WBC: 10 10*3/uL (ref 4.0–10.5)
nRBC: 0 % (ref 0.0–0.2)

## 2021-03-12 MED ORDER — ENSURE ENLIVE PO LIQD
237.0000 mL | ORAL | Status: DC
  Administered 2021-03-12 – 2021-03-13 (×2): 237 mL via ORAL

## 2021-03-12 NOTE — Progress Notes (Signed)
Physical Therapy Evaluation Patient Details Name: Gail Fitzgerald MRN: [id number] DOB: 1935/01/13 Today's Date: 03/12/2021   History of Present Illness  86 year old admitted 7/2 complaining of back pain that started 5-month prior to admission.  Subsequently her back pain progressed to chest pain.  The morning of admission she developed shortness of breath.  CT revealed moderately Large Left partially Loculated Pleural Effusion: exudative pleural effusion.   Pt Underwent thoracentesis 7/3. PMH:  hypertension  Clinical Impression  Pt admitted with above diagnosis. Pt was able to ambulate without RW with min assist with one LOB needing assist. Pt unsteady at present and may need to use RW for safety on D/C.  Pt was fully independent PTA. Will follow acutely and progress pt as tolerated.  Pt currently with functional limitations due to the deficits listed below (see PT Problem List). Pt will benefit from skilled PT to increase their independence and safety with mobility to allow discharge to the venue listed below.       Follow Up Recommendations Home health PT    Equipment Recommendations  Rolling walker with 5" wheels    Recommendations for Other Services       Precautions / Restrictions Precautions Precautions: Fall Restrictions Weight Bearing Restrictions: No      Mobility  Bed Mobility Overal bed mobility: Independent                  Transfers Overall transfer level: Independent                  Ambulation/Gait Ambulation/Gait assistance: Min guard;Min assist Gait Distance (Feet): 500 Feet Assistive device: None;Rolling walker (2 wheeled) Gait Pattern/deviations: Step-through pattern;Decreased stride length;Staggering left;Narrow base of support   Gait velocity interpretation: <1.8 ft/sec, indicate of risk for recurrent falls General Gait Details: Pt overall ambulating with list to left at times with one LOB needing assist to correct.  Pt states this happens  to her at times. Overall, pt did well but due to the LOB at times recommended to family that she use a RW intiially.  Stairs            Wheelchair Mobility    Modified Rankin (Stroke Patients Only)       Balance Overall balance assessment: Needs assistance Sitting-balance support: No upper extremity supported;Feet supported Sitting balance-Leahy Scale: Fair     Standing balance support: No upper extremity supported;During functional activity Standing balance-Leahy Scale: Fair Standing balance comment: Statically, pt is able to stand without UE support, needs external support and bil Ues with challenges.                             Pertinent Vitals/Pain Pain Assessment: Faces Faces Pain Scale: Hurts little more Pain Location: back Pain Descriptors / Indicators: Aching;Grimacing;Guarding Pain Intervention(s): Limited activity within patient's tolerance;Monitored during session;Repositioned    Home Living Family/patient expects to be discharged to:: Private residence Living Arrangements: Alone (Visiting here with son, lived alone outside of [place]) Available Help at Discharge: Family;Available 24 hours/day Type of Home: House Home Access: Stairs to enter Entrance Stairs-Rails: Left Entrance Stairs-Number of Steps: 5 Home Layout: Two level Home Equipment: None      Prior Function Level of Independence: Independent               Hand Dominance   Dominant Hand: Right    Extremity/Trunk Assessment   Upper Extremity Assessment Upper Extremity Assessment: Defer to OT evaluation  Lower Extremity Assessment Lower Extremity Assessment: Generalized weakness    Cervical / Trunk Assessment Cervical / Trunk Assessment: Normal  Communication   Communication: Other (comment);Prefers language other than [place] (son and daughter in law present and translating - pt is [place])  Cognition Arousal/Alertness: Awake/alert Behavior During Therapy: WFL for  tasks assessed/performed Overall Cognitive Status: Within Functional Limits for tasks assessed                                        General Comments General comments (skin integrity, edema, etc.): 72 bpm, 96% RA, 99/67    Exercises     Assessment/Plan    PT Assessment Patient needs continued PT services  PT Problem List Decreased activity tolerance;Decreased balance;Decreased mobility;Decreased knowledge of use of DME;Decreased safety awareness;Decreased knowledge of precautions;Cardiopulmonary status limiting activity;Pain       PT Treatment Interventions DME instruction;Gait training;Functional mobility training;Therapeutic activities;Therapeutic exercise;Stair training;Balance training;Patient/family education    PT Goals (Current goals can be found in the Care Plan section)  Acute Rehab PT Goals Patient Stated Goal: to go home with son PT Goal Formulation: With patient Time For Goal Achievement: 03/26/21 Potential to Achieve Goals: Good    Frequency Min 3X/week   Barriers to discharge        Co-evaluation               AM-PAC PT "6 Clicks" Mobility  Outcome Measure Help needed turning from your back to your side while in a flat bed without using bedrails?: None Help needed moving from lying on your back to sitting on the side of a flat bed without using bedrails?: None Help needed moving to and from a bed to a chair (including a wheelchair)?: A Little Help needed standing up from a chair using your arms (e.g., wheelchair or bedside chair)?: A Little Help needed to walk in hospital room?: A Little Help needed climbing 3-5 steps with a railing? : A Little 6 Click Score: 20    End of Session Equipment Utilized During Treatment: Gait belt Activity Tolerance: Patient limited by fatigue Patient left: in bed;with call bell/phone within reach;with bed alarm set;with family/visitor present Nurse Communication: Mobility status PT Visit Diagnosis:  Unsteadiness on feet (R26.81);Muscle weakness (generalized) (M62.81)    Time: 1027-2536 PT Time Calculation (min) (ACUTE ONLY): 25 min   Charges:   PT Evaluation $PT Eval Moderate Complexity: 1 Mod PT Treatments $Gait Training: 8-22 mins        Coden Franchi M,PT Acute Rehab Services [phone] (pager)   Alvira Philips 03/12/2021, 4:26 PM

## 2021-03-12 NOTE — Progress Notes (Signed)
PROGRESS NOTE    Gail Fitzgerald  HAL:[id number] DOB: July 01, 1935 DOA: 03/10/2021 PCP: System, Provider Not In   Brief Narrative: 86 year old with past medical history significant for hypertension presents complaining of back pain that is started 53-month prior to admission.  Subsequently her back pain progressed to chest pain.  The morning of admission she developed shortness of breath.  She presents to the ED for further evaluation.  Evaluation in the ED: CTA showed negative PE, moderately large loculated left-sided pleural effusion, malignancy could not be excluded.  Patient was admitted for further evaluation.   Assessment & Plan:   Active Problems:   Pleural effusion  1-Moderately Large Left partially Loculated Pleural Effusion: exudative pleural effusion.  -Underwent thoracentesis 7/03: yielding 700 dark bloody fluid.  -Pulmonary consulted.  -Pleural fluid: WBC 2240, neutrophil 43, LDH 164, Protein 5.3.  -Discussed with Dr Concepcion Living, plan to start IV antibiotics. Follow cytology. Might need repeat chest x ray.  -CT chest: Interval decreasing complex left pleural fluid collection with persistent collapse/consolidative opacity in the lingula and left lower lobe.  Given unilateral disease, malignancy remains a concern. -ECHO normal Ef, diastolic dysfunction grade 1.  -Pleural fluid: culture no growth. LDH ratio 1.2, protein ratio: 0.7.  -Awaiting cytology. Plan to repeat chest x ray tomorrow.   2-HTN:  Hold lisinopril due to elevated potasium.  Continue with metoprolol.  SBP 130 range.  PRN hydralazine.   3-Dyslipidemia:  Continue with statins.   4-4.3 cm diameter ascending thoracic aorta: Will need follow-up 5-Mild Hyponatremia; monitor.     There is no height or weight on file to calculate BMI.   DVT prophylaxis: SCD Code Status: Full Code Family Communication: daughter in law at bedside.  Disposition Plan:  Status is: Observation  The patient remains OBS appropriate  and will d/c before 2 midnights.  Dispo: The patient is from: Home              Anticipated d/c is to: Home              Patient currently is not medically stable to d/c.   Difficult to place patient No        Consultants:  IR   Procedures:  Thoracentesis.   Antimicrobials:    Subjective: Report back pain now most lower part.  She was worry about her BP reading.   Objective: Vitals:   03/12/21 0428 03/12/21 0800 03/12/21 1127 03/12/21 1200  BP: (!) 106/59 118/65 (!) 110/56 (!) 123/55  Pulse: 63 68 73 70  Resp: 17 17 18 20   Temp: 98 F (36.7 C) 98.2 F (36.8 C)    TempSrc: Oral Oral    SpO2: 95% 94% 93% 95%    Intake/Output Summary (Last 24 hours) at 03/12/2021 1258 Last data filed at 03/12/2021 0800 Gross per 24 hour  Intake 340.07 ml  Output --  Net 340.07 ml    There were no vitals filed for this visit.  Examination:  General exam: NAD Respiratory system: Left side crackles.  Cardiovascular system: S 1, S 2 RRR Gastrointestinal system: BS present, soft, nt Central nervous system: alert, non focal.  Extremities: Symmetric power   Data Reviewed: I have personally reviewed following labs and imaging studies  CBC: Recent Labs  Lab 03/10/21 0900 03/11/21 0151 03/11/21 2356  WBC 9.9 10.8* 10.0  HGB 12.6 13.6 12.7  HCT 39.8 40.6 39.8  MCV 89.4 86.9 87.3  PLT 238 224 735    Basic Metabolic Panel: Recent Labs  Lab  03/10/21 0900 03/11/21 0151 03/11/21 2356  NA 132* 133* 134*  K 4.6 5.0 4.2  CL 98 95* 98  CO2 25 25 26   GLUCOSE 103* 98 101*  BUN 20 21 19   CREATININE 1.00 1.11* 1.08*  CALCIUM 8.9 9.3 8.8*    GFR: CrCl cannot be calculated (Unknown ideal weight.). Liver Function Tests: Recent Labs  Lab 03/10/21 1849 03/11/21 0151  AST 20 20  ALT 9 9  ALKPHOS 91 88  BILITOT 1.2 1.0  PROT 7.9 7.0  ALBUMIN 4.1 3.7    No results for input(s): LIPASE, AMYLASE in the last 168 hours. No results for input(s): AMMONIA in the last 168  hours. Coagulation Profile: Recent Labs  Lab 03/11/21 0151  INR 1.1    Cardiac Enzymes: No results for input(s): CKTOTAL, CKMB, CKMBINDEX, TROPONINI in the last 168 hours. BNP (last 3 results) No results for input(s): PROBNP in the last 8760 hours. HbA1C: No results for input(s): HGBA1C in the last 72 hours. CBG: No results for input(s): GLUCAP in the last 168 hours. Lipid Profile: No results for input(s): CHOL, HDL, LDLCALC, TRIG, CHOLHDL, LDLDIRECT in the last 72 hours. Thyroid Function Tests: No results for input(s): TSH, T4TOTAL, FREET4, T3FREE, THYROIDAB in the last 72 hours. Anemia Panel: No results for input(s): VITAMINB12, FOLATE, FERRITIN, TIBC, IRON, RETICCTPCT in the last 72 hours. Sepsis Labs: No results for input(s): PROCALCITON, LATICACIDVEN in the last 168 hours.  Recent Results (from the past 240 hour(s))  Resp Panel by RT-PCR (Flu A&B, Covid) Nasopharyngeal Swab     Status: None   Collection Time: 03/10/21 10:50 AM   Specimen: Nasopharyngeal Swab; Nasopharyngeal(NP) swabs in vial transport medium  Result Value Ref Range Status   SARS Coronavirus 2 by RT PCR NEGATIVE NEGATIVE Final    Comment: (NOTE) SARS-CoV-2 target nucleic acids are NOT DETECTED.  The SARS-CoV-2 RNA is generally detectable in upper respiratory specimens during the acute phase of infection. The lowest concentration of SARS-CoV-2 viral copies this assay can detect is 138 copies/mL. A negative result does not preclude SARS-Cov-2 infection and should not be used as the sole basis for treatment or other patient management decisions. A negative result may occur with  improper specimen collection/handling, submission of specimen other than nasopharyngeal swab, presence of viral mutation(s) within the areas targeted by this assay, and inadequate number of viral copies(<138 copies/mL). A negative result must be combined with clinical observations, patient history, and  epidemiological information. The expected result is Negative.  Fact Sheet for Patients:  EntrepreneurPulse.com.au  Fact Sheet for Healthcare Providers:  IncredibleEmployment.be  This test is no t yet approved or cleared by the [place] FDA and  has been authorized for detection and/or diagnosis of SARS-CoV-2 by FDA under an Emergency Use Authorization (EUA). This EUA will remain  in effect (meaning this test can be used) for the duration of the COVID-19 declaration under Section 564(b)(1) of the Act, 21 U.S.C.section 360bbb-3(b)(1), unless the authorization is terminated  or revoked sooner.       Influenza A by PCR NEGATIVE NEGATIVE Final   Influenza B by PCR NEGATIVE NEGATIVE Final    Comment: (NOTE) The Xpert Xpress SARS-CoV-2/FLU/RSV plus assay is intended as an aid in the diagnosis of influenza from Nasopharyngeal swab specimens and should not be used as a sole basis for treatment. Nasal washings and aspirates are unacceptable for Xpert Xpress SARS-CoV-2/FLU/RSV testing.  Fact Sheet for Patients: EntrepreneurPulse.com.au  Fact Sheet for Healthcare Providers: IncredibleEmployment.be  This test is  not yet approved or cleared by the [place] and has been authorized for detection and/or diagnosis of SARS-CoV-2 by FDA under an Emergency Use Authorization (EUA). This EUA will remain in effect (meaning this test can be used) for the duration of the COVID-19 declaration under Section 564(b)(1) of the Act, 21 U.S.C. section 360bbb-3(b)(1), unless the authorization is terminated or revoked.  Performed at [facility], [place] [address], [place], [place] [address]   Body fluid culture w Gram Stain     Status: None (Preliminary result)   Collection Time: 03/11/21  9:05 AM   Specimen: Body Fluid  Result Value Ref Range Status   Specimen Description FLUID  Final   Special Requests PLEURAL  LEFT  Final   Gram Stain   Final    FEW WBC PRESENT, PREDOMINANTLY PMN NO ORGANISMS SEEN    Culture   Final    NO GROWTH < 24 HOURS Performed at [facility], [address] [place], [place] [address]    Report Status PENDING  Incomplete          Radiology Studies: DG Chest 1 View  Result Date: 03/11/2021 CLINICAL DATA:  Status post left-sided thoracentesis EXAM: CHEST  1 VIEW COMPARISON:  Preoperative radiograph obtained yesterday FINDINGS: No evidence of pneumothorax. Decreased left-sided pleural effusion. Stable cardiac and mediastinal contours. The right lung remains clear. Background bronchitic changes. Atherosclerotic calcifications noted in the transverse aorta. No acute osseous abnormality. IMPRESSION: No evidence of pneumothorax or other complication status post left thoracentesis. Decreased volume of left pleural effusion. Electronically Signed   By: Jacqulynn Cadet M.D.   On: 03/11/2021 10:14   CT CHEST WO CONTRAST  Result Date: 03/11/2021 CLINICAL DATA:  Pleural effusion. EXAM: CT CHEST WITHOUT CONTRAST TECHNIQUE: Multidetector CT imaging of the chest was performed following the standard protocol without IV contrast. COMPARISON:  Insert CT a chest 03/10/2021 FINDINGS: Cardiovascular: The heart size is normal. No substantial pericardial effusion. Coronary artery calcification is evident. Atherosclerotic calcification is noted in the wall of the thoracic aorta. Ascending thoracic aorta measures 4.3 cm diameter. Mediastinum/Nodes: No mediastinal lymphadenopathy. Calcified nodal tissue is seen in the mediastinum and upper right hilum 15 mm left thyroid nodule evident. There is no axillary lymphadenopathy. Lungs/Pleura: Pleuroparenchymal scarring noted in the lung apices. 1.6 x 0.7 cm bandlike opacity in the posterior right lung apex (22/4) is probably scar. No suspicious pulmonary nodule or mass in the right lung. 10 x 5 mm left upper lobe pleural base nodule identified on  49/4. Left pleural effusion has decreased in the interval with persistent small to moderate volume complex pleural fluid on today's study and left base collapse/consolidative opacity. Lack of intravenous contrast and the lung collapse hinders assessment. Although subtle, there may be a 3.8 x 1.9 cm focus of minimally increased attenuation along the left paraspinal pleura (107/3) although no lesion was seen at this location on yesterday's CT. Upper Abdomen: 3.9 cm lesion upper pole left kidney has attenuation higher than would be expected for a simple cyst but has been incompletely visualized. Musculoskeletal: No worrisome lytic or sclerotic osseous abnormality. IMPRESSION: 1. Interval decrease in complex left pleural fluid collection with persistent collapse/consolidative opacity in the lingula and left lower lobe. Lack of intravenous contrast on today's study limits assessment. Given unilateral disease, malignancy remains a concern and PET-CT may prove helpful to further evaluate. 2. 10 x 5 mm subtle nodular focus along the lateral left pleura, indeterminate. 3. 4.3 cm diameter  ascending thoracic aorta. Recommend annual imaging followup by CTA or MRA. This recommendation follows 2010 ACCF/AHA/AATS/ACR/ASA/SCA/SCAI/SIR/STS/SVM Guidelines for the Diagnosis and Management of Patients with Thoracic Aortic Disease. Circulation. 2010; 121: O378-H885. Aortic aneurysm NOS (ICD10-I71.9) 4. 1.5 cm left thyroid nodule. Recommend thyroid US (ref: J Am Coll Radiol. 2015 Feb;12(2): 143-50). 5.  Aortic Atherosclerois (ICD10-170.0) Electronically Signed   By: Misty Stanley M.D.   On: 03/11/2021 12:55   CT Angio Chest Pulmonary Embolism (PE) W or WO Contrast  Result Date: 03/10/2021 CLINICAL DATA:  Chest pain, SOB.  Rule out PE. EXAM: CT ANGIOGRAPHY CHEST WITH CONTRAST TECHNIQUE: Multidetector CT imaging of the chest was performed using the standard protocol during bolus administration of intravenous contrast. Multiplanar CT  image reconstructions and MIPs were obtained to evaluate the vascular anatomy. CONTRAST:  132mL OMNIPAQUE IOHEXOL 350 MG/ML SOLN COMPARISON:  None. FINDINGS: Cardiovascular: Satisfactory opacification of the pulmonary arteries to the segmental level. No evidence of pulmonary embolism. Normal heart size. No pericardial effusion. Aortic atherosclerosis. Increase caliber the ascending thoracic aorta measures 4.1 cm, image 59/7. Coronary artery calcifications. No pericardial effusion. Mediastinum/Nodes: Normal appearance of the thyroid gland. The trachea appears patent and is midline. Normal appearance of the esophagus. No enlarged lymph nodes. Lungs/Pleura: There is a moderate to large left pleural effusion. This appears partially loculated with fluid along the oblique fissure and fluid extending over the posterior and apical portions of the left upper lobe. Passive atelectasis of the left lower lobe is identified. Complete atelectasis of the lingula is identified. There is mild pleural thickening overlying the medial right upper lobe measuring up to 6 mm in thickness. Pleural nodule overlying the anterior basal right upper lobe measures 2.2 by 0.8 cm. Right lung appears clear. No right pleural effusion identified. Upper Abdomen: No acute abnormality within the limited imaging of the upper abdomen. Small hiatal hernia noted. Musculoskeletal: The bones appear diffusely osteopenic. No acute or suspicious osseous findings. Review of the MIP images confirms the above findings. IMPRESSION: 1. No evidence for acute pulmonary embolus. 2. Moderate to large left pleural effusion appears partially loculated. Areas of pleural thickening and nodularity are identified overlying the left upper lobe as described. Cannot exclude malignant pleural effusion. Further evaluation with diagnostic thoracentesis is advised. 3. Complete atelectasis of the lingula is identified. Although no obvious central obstructing masses identified  underlying malignancy is not excluded and follow-up imaging is advised to ensure re-expansion the lingula and to exclude underlying obstructing lesion. 4. Coronary artery calcifications noted. 5. Increase caliber of the ascending thoracic aorta measuring 4.1 cm. Recommend annual imaging followup by CTA or MRA. This recommendation follows 2010 ACCF/AHA/AATS/ACR/ASA/SCA/SCAI/SIR/STS/SVM Guidelines for the Diagnosis and Management of Patients with Thoracic Aortic Disease. Circulation. 2010; 121: O277-A128. Aortic aneurysm NOS (ICD10-I71.9) Aortic Atherosclerosis (ICD10-I70.0). Electronically Signed   By: Kerby Moors M.D.   On: 03/10/2021 14:39   ECHOCARDIOGRAM COMPLETE  Result Date: 03/11/2021    ECHOCARDIOGRAM REPORT   Patient Name:   MAURIANNA BENARD Date of Exam: 03/11/2021 Medical Rec #:  [id number]        Height: Accession #:    [id number]       Weight: Date of Birth:  08-04-1935        BSA: Patient Age:    34 years         BP:           149/66 mmHg Patient Gender: F  HR:           68 bpm. Exam Location:  Inpatient Procedure: 2D Echo, Color Doppler and Cardiac Doppler Indications:    V67.20 Acute diastolic (congestive) heart failure  History:        Patient has no prior history of Echocardiogram examinations.                 Risk Factors:Hypertension.  Sonographer:    Raquel Sarna Senior RDCS Referring Phys: [id number] [place]  1. Left ventricular ejection fraction, by estimation, is 55 to 60%. The left ventricle has normal function. Left ventricular endocardial border not optimally defined to evaluate regional wall motion, but appears grossly normal based on limited visualization. Left ventricular diastolic parameters are consistent with Grade I diastolic dysfunction (impaired relaxation).  2. Right ventricular systolic function is normal. The right ventricular size is normal. There is normal pulmonary artery systolic pressure.  3. The mitral valve is normal in structure.  Trivial mitral valve regurgitation. No evidence of mitral stenosis.  4. The aortic valve is tricuspid. There is mild calcification of the aortic valve. There is mild thickening of the aortic valve. Aortic valve regurgitation is mild. Mild aortic valve sclerosis is present, with no evidence of aortic valve stenosis.  5. The inferior vena cava is normal in size with greater than 50% respiratory variability, suggesting right atrial pressure of 3 mmHg. Comparison(s): No prior Echocardiogram. FINDINGS  Left Ventricle: Left ventricular ejection fraction, by estimation, is 55 to 60%. The left ventricle has normal function. Left ventricular endocardial border not optimally defined to evaluate regional wall motion. The left ventricular internal cavity size was normal in size. There is no left ventricular hypertrophy. Left ventricular diastolic parameters are consistent with Grade I diastolic dysfunction (impaired relaxation). Right Ventricle: The right ventricular size is normal. Right vetricular wall thickness was not well visualized. Right ventricular systolic function is normal. There is normal pulmonary artery systolic pressure. The tricuspid regurgitant velocity is 2.00 m/s, and with an assumed right atrial pressure of 3 mmHg, the estimated right ventricular systolic pressure is 83.6 mmHg. Left Atrium: Left atrial size was normal in size. Right Atrium: Right atrial size was normal in size. Pericardium: There is no evidence of pericardial effusion. Mitral Valve: The mitral valve is normal in structure. There is mild thickening of the mitral valve leaflet(s). There is mild calcification of the mitral valve leaflet(s). Mild mitral annular calcification. Trivial mitral valve regurgitation. No evidence  of mitral valve stenosis. Tricuspid Valve: The tricuspid valve is normal in structure. Tricuspid valve regurgitation is trivial. Aortic Valve: The aortic valve is tricuspid. There is mild calcification of the aortic valve.  There is mild thickening of the aortic valve. Aortic valve regurgitation is mild. Mild aortic valve sclerosis is present, with no evidence of aortic valve stenosis. Pulmonic Valve: The pulmonic valve was not well visualized. Pulmonic valve regurgitation is trivial. Aorta: The aortic root is normal in size and structure. Venous: The inferior vena cava is normal in size with greater than 50% respiratory variability, suggesting right atrial pressure of 3 mmHg. IAS/Shunts: No atrial level shunt detected by color flow Doppler.  LEFT VENTRICLE PLAX 2D LVIDd:         3.30 cm  Diastology LVIDs:         2.30 cm  LV e' medial:    4.90 cm/s LV PW:         0.60 cm  LV E/e' medial:  10.7 LV IVS:  0.90 cm  LV e' lateral:   8.16 cm/s LVOT diam:     2.00 cm  LV E/e' lateral: 6.4 LV SV:         56 LVOT Area:     3.14 cm  RIGHT VENTRICLE RV S prime:     18.10 cm/s TAPSE (M-mode): 1.9 cm LEFT ATRIUM             RIGHT ATRIUM LA diam:        2.30 cm RA Area:     13.70 cm LA Vol (A2C):   47.4 ml RA Volume:   33.60 ml LA Vol (A4C):   16.6 ml LA Biplane Vol: 27.9 ml  AORTIC VALVE LVOT Vmax:   103.00 cm/s LVOT Vmean:  62.900 cm/s LVOT VTI:    0.179 m  AORTA Ao Root diam: 3.30 cm MITRAL VALVE               TRICUSPID VALVE MV Area (PHT): 2.50 cm    TR Peak grad:   16.0 mmHg MV Decel Time: 303 msec    TR Vmax:        200.00 cm/s MV E velocity: 52.40 cm/s MV A velocity: 76.00 cm/s  SHUNTS MV E/A ratio:  0.69        Systemic VTI:  0.18 m                            Systemic Diam: 2.00 cm Gwyndolyn Kaufman MD Electronically signed by Gwyndolyn Kaufman MD Signature Date/Time: 03/11/2021/12:09:21 PM    Final    US THORACENTESIS ASP PLEURAL SPACE W/IMG GUIDE  Result Date: 03/11/2021 INDICATION: Left pleural effusion Back pain; wt loss EXAM: ULTRASOUND GUIDED LEFT THORACENTESIS MEDICATIONS: 10% 2% lidocaine. COMPLICATIONS: None immediate. PROCEDURE: An ultrasound guided thoracentesis was thoroughly discussed with the patient and questions  answered. The benefits, risks, alternatives and complications were also discussed. The patient understands and wishes to proceed with the procedure. Written consent was obtained. Ultrasound was performed to localize and mark an adequate pocket of fluid in the left chest. The area was then prepped and draped in the normal sterile fashion. 1% Lidocaine was used for local anesthesia. Under ultrasound guidance a Yueh catheter was introduced. Thoracentesis was performed. The catheter was removed and a dressing applied. FINDINGS: A total of approximately 700 cc of dark bloody fluid was removed. Samples were sent to the laboratory as requested by the clinical team. IMPRESSION: Successful ultrasound guided left thoracentesis yielding 700 cc of pleural fluid. CXR shows No PTX per Dr Anselm Pancoast Read by Lavonia Drafts [facility] Electronically Signed   By: Markus Daft M.D.   On: 03/11/2021 10:02        Scheduled Meds:  azithromycin  500 mg Oral Daily   feeding supplement  237 mL Oral Q24H   magnesium oxide  400 mg Oral Daily   metoprolol tartrate  50 mg Oral BID   simvastatin  20 mg Oral Daily   sodium chloride flush  3 mL Intravenous Q12H   Continuous Infusions:  sodium chloride     cefTRIAXone (ROCEPHIN)  IV 2 g (03/11/21 1447)     LOS: 0 days    Time spent: 35 minutes.     Elmarie Shiley, MD [facility]   If 7PM-7AM, please contact night-coverage [email or web address]  03/12/2021, 12:58 PM

## 2021-03-12 NOTE — Plan of Care (Signed)

## 2021-03-13 ENCOUNTER — Inpatient Hospital Stay (HOSPITAL_COMMUNITY): Payer: Medicaid Other

## 2021-03-13 LAB — CBC
HCT: 39.9 % (ref 36.0–46.0)
Hemoglobin: 12.8 g/dL (ref 12.0–15.0)
MCH: 28.3 pg (ref 26.0–34.0)
MCHC: 32.1 g/dL (ref 30.0–36.0)
MCV: 88.3 fL (ref 80.0–100.0)
Platelets: 228 10*3/uL (ref 150–400)
RBC: 4.52 MIL/uL (ref 3.87–5.11)
RDW: 13.3 % (ref 11.5–15.5)
WBC: 10.3 10*3/uL (ref 4.0–10.5)
nRBC: 0 % (ref 0.0–0.2)

## 2021-03-13 LAB — BASIC METABOLIC PANEL
Anion gap: 8 (ref 5–15)
BUN: 27 mg/dL — ABNORMAL HIGH (ref 8–23)
CO2: 28 mmol/L (ref 22–32)
Calcium: 8.6 mg/dL — ABNORMAL LOW (ref 8.9–10.3)
Chloride: 98 mmol/L (ref 98–111)
Creatinine, Ser: 0.97 mg/dL (ref 0.44–1.00)
GFR, Estimated: 58 mL/min — ABNORMAL LOW (ref >60)
Glucose, Bld: 111 mg/dL — ABNORMAL HIGH (ref 70–99)
Potassium: 4.7 mmol/L (ref 3.5–5.1)
Sodium: 134 mmol/L — ABNORMAL LOW (ref 135–145)

## 2021-03-13 LAB — MRSA NEXT GEN BY PCR, NASAL: MRSA by PCR Next Gen: NOT DETECTED

## 2021-03-13 MED ORDER — SACCHAROMYCES BOULARDII 250 MG PO CAPS
250.0000 mg | ORAL_CAPSULE | Freq: Two times a day (BID) | ORAL | Status: DC
  Administered 2021-03-13 – 2021-03-14 (×3): 250 mg via ORAL
  Filled 2021-03-13 (×3): qty 1

## 2021-03-13 NOTE — Evaluation (Addendum)
Occupational Therapy Evaluation Patient Details Name: Gail Fitzgerald MRN: [id number] DOB: 1935-07-25 Today's Date: 03/13/2021    History of Present Illness 86 year old admitted 7/2 complaining of back pain that started 34-month prior to admission.  Subsequently her back pain progressed to chest pain.  The morning of admission she developed shortness of breath.  CT revealed moderately Large Left partially Loculated Pleural Effusion: exudative pleural effusion.   Pt Underwent thoracentesis 7/3. PMH:  hypertension   Clinical Impression   PTA patient independent. Admitted for above and presenting with problem list below, including pain, decreased activity tolerance and generalized weakness.  Limited session due to increased pain with forward lean during LB ADL tasks, and pt requesting to lay back down (pain improved with supine position).  Requiring up to min assist for ADLs at this time. Reviewed compensatory techniques for LB dressing and notified RN of pain. Believe pt will benefit from further OT services while admitted to optimize independence, tolerance for ADL participation but anticipate no further needs after dc home with family support.     Follow Up Recommendations  No OT follow up;Supervision - Intermittent    Equipment Recommendations  Tub/shower seat    Recommendations for Other Services       Precautions / Restrictions Precautions Precautions: Fall Restrictions Weight Bearing Restrictions: No      Mobility Bed Mobility Overal bed mobility: Modified Independent             General bed mobility comments: HOB elevated    Transfers                 General transfer comment: unable, pt requesting to lay back down after engaging in LB dressing tasks due to pain with from leaning foward    Balance Overall balance assessment: Needs assistance Sitting-balance support: No upper extremity supported;Feet supported Sitting balance-Leahy Scale: Good                                      ADL either performed or assessed with clinical judgement   ADL Overall ADL's : Needs assistance/impaired     Grooming: Set up;Sitting           Upper Body Dressing : Set up;Sitting   Lower Body Dressing: Minimal assistance;Sitting/lateral leans Lower Body Dressing Details (indicate cue type and reason): pt able to doff socks but with foward lean caused increased pain and required patient to lay back to down   Toilet Transfer Details (indicate cue type and reason): unable           General ADL Comments: pt limited by pain, decreased activity tolerance, generalized weakness     Vision         Perception     Praxis      Pertinent Vitals/Pain Pain Assessment: Faces Faces Pain Scale: Hurts even more Pain Location: pulling at chest/back on L side with leaning forward Pain Descriptors / Indicators: Aching;Grimacing;Guarding;Other (Comment) (pullong) Pain Intervention(s): Monitored during session;Limited activity within patient's tolerance;Repositioned     Hand Dominance Right   Extremity/Trunk Assessment Upper Extremity Assessment Upper Extremity Assessment: Generalized weakness   Lower Extremity Assessment Lower Extremity Assessment: Defer to PT evaluation   Cervical / Trunk Assessment Cervical / Trunk Assessment: Normal   Communication Communication Communication: Prefers language other than English (family interpreting per preference- pt is russian)   Cognition Arousal/Alertness: Awake/alert Behavior During Therapy: WFL for tasks assessed/performed Overall Cognitive Status:  Within Functional Limits for tasks assessed                                 General Comments: appears [facility]   General Comments  VSS on RA    Exercises     Shoulder Instructions      Home Living Family/patient expects to be discharged to:: Private residence Living Arrangements: Alone (visiting here with son, lives outside [place]  alone) Available Help at Discharge: Family;Available 24 hours/day Type of Home: House Home Access: Stairs to enter [employer] of Steps: 5 Entrance Stairs-Rails: Left Home Layout: Two level Alternate Level Stairs-Number of Steps: 14 Alternate Level Stairs-Rails: Left;Right;Can reach both Bathroom Shower/Tub: Teacher, early years/pre: Standard     Home Equipment: None          Prior Functioning/Environment Level of Independence: Independent        Comments: enjoys gardening and cooking meals        OT Problem List: Decreased strength;Impaired balance (sitting and/or standing);Decreased safety awareness;Decreased knowledge of use of DME or AE;Decreased knowledge of precautions;Decreased activity tolerance;Pain      OT Treatment/Interventions: Self-care/ADL training;Therapeutic exercise;DME and/or AE instruction;Therapeutic activities;Patient/family education;Energy conservation    OT Goals(Current goals can be found in the care plan section) Acute Rehab OT Goals Patient Stated Goal: to go home with son OT Goal Formulation: With patient/family Time For Goal Achievement: 03/27/21 Potential to Achieve Goals: Good  OT Frequency: Min 2X/week   Barriers to D/C:            Co-evaluation              AM-PAC OT "6 Clicks" Daily Activity     Outcome Measure Help from another person eating meals?: None Help from another person taking care of personal grooming?: A Little Help from another person toileting, which includes using toliet, bedpan, or urinal?: A Little Help from another person bathing (including washing, rinsing, drying)?: A Little Help from another person to put on and taking off regular upper body clothing?: A Little Help from another person to put on and taking off regular lower body clothing?: A Little 6 Click Score: 19   End of Session Nurse Communication: Mobility status;Other (comment) (pain)  Activity Tolerance: Patient limited  by pain Patient left: in bed;with call bell/phone within reach;with family/visitor present  OT Visit Diagnosis: Other abnormalities of gait and mobility (R26.89);Pain Pain - Right/Left: Left Pain - part of body:  (chest/back)                Time: 5621-3086 OT Time Calculation (min): 18 min Charges:  OT General Charges $OT Visit: 1 Visit OT Evaluation $OT Eval Moderate Complexity: 1 Mod  Jolaine Artist, OT Acute Rehabilitation Services Pager [phone] Office [phone]   Delight Stare 03/13/2021, 1:19 PM

## 2021-03-13 NOTE — Progress Notes (Signed)
PROGRESS NOTE    Gail Fitzgerald  FUX:[id number] DOB: 1935-03-26 DOA: 03/10/2021 PCP: System, Provider Not In   Brief Narrative: 86 year old with past medical history significant for hypertension presents complaining of back pain that is started 40-month prior to admission.  Subsequently her back pain progressed to chest pain.  The morning of admission she developed shortness of breath.  She presents to the ED for further evaluation.  Evaluation in the ED: CTA showed negative PE, moderately large loculated left-sided pleural effusion, malignancy could not be excluded.  Patient was admitted for further evaluation.   Assessment & Plan:   Active Problems:   Pleural effusion  1-Moderately Large Left partially Loculated Pleural Effusion: exudative pleural effusion.  -Underwent thoracentesis 7/03: yielding 700 dark bloody fluid.  -Pulmonary consulted.  -Pleural fluid: WBC 2240, neutrophil 43, LDH 164, Protein 5.3.  -Discussed with Dr Concepcion Living 7/03:  plan to start IV antibiotics. Follow cytology. Might need repeat chest x ray.  -CT chest: Interval decreasing complex left pleural fluid collection with persistent collapse/consolidative opacity in the lingula and left lower lobe.  Given unilateral disease, malignancy remains a concern. -ECHO normal Ef, diastolic dysfunction grade 1.  -Pleural fluid: culture no growth. LDH ratio 1.2, protein ratio: 0.7.  -Awaiting cytology. Repeat chest x ray 6/05: left base infiltrate noted, small to moderate left side effusion.  Pulmonologist is awaiting cytology result to determine next step in treatment.   2-HTN:  Hold lisinopril due to elevated potasium.  Continue with metoprolol.  PRN hydralazine.   3-Dyslipidemia:  Continue with statins.   4-4.3 cm diameter ascending thoracic aorta: Will need follow-up 5-Mild Hyponatremia; monitor.  6-GI; Loose stool. Start florastore. Monitor.    There is no height or weight on file to calculate BMI.   DVT  prophylaxis: SCD Code Status: Full Code Family Communication: daughter in law at bedside.  Disposition Plan:  Status is: Observation  The patient remains OBS appropriate and will d/c before 2 midnights.  Dispo: The patient is from: Home              Anticipated d/c is to: Home              Patient currently is not medically stable to d/c.   Difficult to place patient No        Consultants:  IR   Procedures:  Thoracentesis.   Antimicrobials:    Subjective: Daughter in law helps with translation.  Patient is complaining of left side back pain. She needs something for pain.  She has had 2 loose stool, watery. One yesterday, second BM today.     Objective: Vitals:   03/12/21 1655 03/12/21 1900 03/13/21 0746 03/13/21 1100  BP: 130/61 122/71 118/68 (!) 115/56  Pulse: 76 79 76 (!) 56  Resp: 17 18 19  (!) 21  Temp: 98.4 F (36.9 C) 98.7 F (37.1 C) 98.2 F (36.8 C) 98.3 F (36.8 C)  TempSrc: Oral Oral Oral Oral  SpO2: 96% 98% 98% 97%    Intake/Output Summary (Last 24 hours) at 03/13/2021 1225 Last data filed at 03/12/2021 1700 Gross per 24 hour  Intake 240 ml  Output --  Net 240 ml    There were no vitals filed for this visit.  Examination:  General exam: NAD Respiratory system: Left side crackles.  Cardiovascular system: S 1, S 2 RRR Gastrointestinal system: BS present, soft, nt Central nervous system: Alert, non focal.  Extremities: Symmetric power.    Data Reviewed: I have personally reviewed following  labs and imaging studies  CBC: Recent Labs  Lab 03/10/21 0900 03/11/21 0151 03/11/21 2356 03/13/21 0012  WBC 9.9 10.8* 10.0 10.3  HGB 12.6 13.6 12.7 12.8  HCT 39.8 40.6 39.8 39.9  MCV 89.4 86.9 87.3 88.3  PLT 238 224 242 323    Basic Metabolic Panel: Recent Labs  Lab 03/10/21 0900 03/11/21 0151 03/11/21 2356 03/13/21 0012  NA 132* 133* 134* 134*  K 4.6 5.0 4.2 4.7  CL 98 95* 98 98  CO2 25 25 26 28   GLUCOSE 103* 98 101* 111*  BUN  20 21 19  27*  CREATININE 1.00 1.11* 1.08* 0.97  CALCIUM 8.9 9.3 8.8* 8.6*    GFR: CrCl cannot be calculated (Unknown ideal weight.). Liver Function Tests: Recent Labs  Lab 03/10/21 1849 03/11/21 0151  AST 20 20  ALT 9 9  ALKPHOS 91 88  BILITOT 1.2 1.0  PROT 7.9 7.0  ALBUMIN 4.1 3.7    No results for input(s): LIPASE, AMYLASE in the last 168 hours. No results for input(s): AMMONIA in the last 168 hours. Coagulation Profile: Recent Labs  Lab 03/11/21 0151  INR 1.1    Cardiac Enzymes: No results for input(s): CKTOTAL, CKMB, CKMBINDEX, TROPONINI in the last 168 hours. BNP (last 3 results) No results for input(s): PROBNP in the last 8760 hours. HbA1C: No results for input(s): HGBA1C in the last 72 hours. CBG: No results for input(s): GLUCAP in the last 168 hours. Lipid Profile: No results for input(s): CHOL, HDL, LDLCALC, TRIG, CHOLHDL, LDLDIRECT in the last 72 hours. Thyroid Function Tests: No results for input(s): TSH, T4TOTAL, FREET4, T3FREE, THYROIDAB in the last 72 hours. Anemia Panel: No results for input(s): VITAMINB12, FOLATE, FERRITIN, TIBC, IRON, RETICCTPCT in the last 72 hours. Sepsis Labs: No results for input(s): PROCALCITON, LATICACIDVEN in the last 168 hours.  Recent Results (from the past 240 hour(s))  Resp Panel by RT-PCR (Flu A&B, Covid) Nasopharyngeal Swab     Status: None   Collection Time: 03/10/21 10:50 AM   Specimen: Nasopharyngeal Swab; Nasopharyngeal(NP) swabs in vial transport medium  Result Value Ref Range Status   SARS Coronavirus 2 by RT PCR NEGATIVE NEGATIVE Final    Comment: (NOTE) SARS-CoV-2 target nucleic acids are NOT DETECTED.  The SARS-CoV-2 RNA is generally detectable in upper respiratory specimens during the acute phase of infection. The lowest concentration of SARS-CoV-2 viral copies this assay can detect is 138 copies/mL. A negative result does not preclude SARS-Cov-2 infection and should not be used as the sole basis for  treatment or other patient management decisions. A negative result may occur with  improper specimen collection/handling, submission of specimen other than nasopharyngeal swab, presence of viral mutation(s) within the areas targeted by this assay, and inadequate number of viral copies(<138 copies/mL). A negative result must be combined with clinical observations, patient history, and epidemiological information. The expected result is Negative.  Fact Sheet for Patients:  EntrepreneurPulse.com.au  Fact Sheet for Healthcare Providers:  IncredibleEmployment.be  This test is no t yet approved or cleared by the [place] FDA and  has been authorized for detection and/or diagnosis of SARS-CoV-2 by FDA under an Emergency Use Authorization (EUA). This EUA will remain  in effect (meaning this test can be used) for the duration of the COVID-19 declaration under Section 564(b)(1) of the Act, 21 U.S.C.section 360bbb-3(b)(1), unless the authorization is terminated  or revoked sooner.       Influenza A by PCR NEGATIVE NEGATIVE Final   Influenza B by  PCR NEGATIVE NEGATIVE Final    Comment: (NOTE) The Xpert Xpress SARS-CoV-2/FLU/RSV plus assay is intended as an aid in the diagnosis of influenza from Nasopharyngeal swab specimens and should not be used as a sole basis for treatment. Nasal washings and aspirates are unacceptable for Xpert Xpress SARS-CoV-2/FLU/RSV testing.  Fact Sheet for Patients: EntrepreneurPulse.com.au  Fact Sheet for Healthcare Providers: IncredibleEmployment.be  This test is not yet approved or cleared by the [place] FDA and has been authorized for detection and/or diagnosis of SARS-CoV-2 by FDA under an Emergency Use Authorization (EUA). This EUA will remain in effect (meaning this test can be used) for the duration of the COVID-19 declaration under Section 564(b)(1) of the Act, 21  U.S.C. section 360bbb-3(b)(1), unless the authorization is terminated or revoked.  Performed at [facility], [place] [address], [place], [place] [address]   Body fluid culture w Gram Stain     Status: None (Preliminary result)   Collection Time: 03/11/21  9:05 AM   Specimen: Body Fluid  Result Value Ref Range Status   Specimen Description FLUID  Final   Special Requests PLEURAL LEFT  Final   Gram Stain   Final    FEW WBC PRESENT, PREDOMINANTLY PMN NO ORGANISMS SEEN    Culture   Final    NO GROWTH 2 DAYS Performed at [facility], [address], [place], [place] [address]    Report Status PENDING  Incomplete          Radiology Studies: CT CHEST WO CONTRAST  Result Date: 03/11/2021 CLINICAL DATA:  Pleural effusion. EXAM: CT CHEST WITHOUT CONTRAST TECHNIQUE: Multidetector CT imaging of the chest was performed following the standard protocol without IV contrast. COMPARISON:  Insert CT a chest 03/10/2021 FINDINGS: Cardiovascular: The heart size is normal. No substantial pericardial effusion. Coronary artery calcification is evident. Atherosclerotic calcification is noted in the wall of the thoracic aorta. Ascending thoracic aorta measures 4.3 cm diameter. Mediastinum/Nodes: No mediastinal lymphadenopathy. Calcified nodal tissue is seen in the mediastinum and upper right hilum 15 mm left thyroid nodule evident. There is no axillary lymphadenopathy. Lungs/Pleura: Pleuroparenchymal scarring noted in the lung apices. 1.6 x 0.7 cm bandlike opacity in the posterior right lung apex (22/4) is probably scar. No suspicious pulmonary nodule or mass in the right lung. 10 x 5 mm left upper lobe pleural base nodule identified on 49/4. Left pleural effusion has decreased in the interval with persistent small to moderate volume complex pleural fluid on today's study and left base collapse/consolidative opacity. Lack of intravenous contrast and the lung collapse hinders assessment. Although  subtle, there may be a 3.8 x 1.9 cm focus of minimally increased attenuation along the left paraspinal pleura (107/3) although no lesion was seen at this location on yesterday's CT. Upper Abdomen: 3.9 cm lesion upper pole left kidney has attenuation higher than would be expected for a simple cyst but has been incompletely visualized. Musculoskeletal: No worrisome lytic or sclerotic osseous abnormality. IMPRESSION: 1. Interval decrease in complex left pleural fluid collection with persistent collapse/consolidative opacity in the lingula and left lower lobe. Lack of intravenous contrast on today's study limits assessment. Given unilateral disease, malignancy remains a concern and PET-CT may prove helpful to further evaluate. 2. 10 x 5 mm subtle nodular focus along the lateral left pleura, indeterminate. 3. 4.3 cm diameter ascending thoracic aorta. Recommend annual imaging followup by CTA or MRA. This recommendation follows 2010 ACCF/AHA/AATS/ACR/ASA/SCA/SCAI/SIR/STS/SVM Guidelines for the Diagnosis and Management of Patients with Thoracic  Aortic Disease. Circulation. 2010; 121: E071-Q197. Aortic aneurysm NOS (ICD10-I71.9) 4. 1.5 cm left thyroid nodule. Recommend thyroid US (ref: J Am Coll Radiol. 2015 Feb;12(2): 143-50). 5.  Aortic Atherosclerois (ICD10-170.0) Electronically Signed   By: Misty Stanley M.D.   On: 03/11/2021 12:55   DG CHEST PORT 1 VIEW  Result Date: 03/13/2021 CLINICAL DATA:  Shortness of breath.  Chest pain. EXAM: PORTABLE CHEST 1 VIEW COMPARISON:  CT 03/11/2021.  Chest x-ray 03/11/2021. FINDINGS: Mediastinum and hilar structures normal. Left base infiltrate consistent pneumonia again noted. Small moderate left-sided pleural effusion again noted. No pneumothorax. Change thoracic spine. IMPRESSION: Left base infiltrate consistent pneumonia again noted. Small to moderate left-sided pleural effusion again noted. No pneumothorax. Electronically Signed   By: Marcello Moores  Register   On: 03/13/2021 09:58         Scheduled Meds:  azithromycin  500 mg Oral Daily   feeding supplement  237 mL Oral Q24H   magnesium oxide  400 mg Oral Daily   metoprolol tartrate  50 mg Oral BID   saccharomyces boulardii  250 mg Oral BID   simvastatin  20 mg Oral Daily   sodium chloride flush  3 mL Intravenous Q12H   Continuous Infusions:  sodium chloride     cefTRIAXone (ROCEPHIN)  IV 2 g (03/13/21 1027)     LOS: 1 day    Time spent: 35 minutes.     Elmarie Shiley, MD [facility]   If 7PM-7AM, please contact night-coverage [email or web address]  03/13/2021, 12:25 PM

## 2021-03-14 ENCOUNTER — Telehealth: Payer: Self-pay

## 2021-03-14 DIAGNOSIS — J90 Pleural effusion, not elsewhere classified: Secondary | ICD-10-CM

## 2021-03-14 LAB — PH, BODY FLUID: pH, Body Fluid: 7.5

## 2021-03-14 LAB — BODY FLUID CULTURE W GRAM STAIN: Culture: NO GROWTH

## 2021-03-14 LAB — CYTOLOGY - NON PAP

## 2021-03-14 MED ORDER — CEFDINIR 300 MG PO CAPS: 300.0000 mg | ORAL_CAPSULE | Freq: Two times a day (BID) | ORAL | 0 refills | Status: AC

## 2021-03-14 NOTE — Consult Note (Signed)
NAME:  Gail Fitzgerald, MRN:  [id number], DOB:  1935/02/25, LOS: 2 ADMISSION DATE:  03/10/2021, CONSULTATION DATE:  03/10/2021 REFERRING MD:  Rudolpho Sevin MD, CHIEF COMPLAINT:  Pleural effusion   History of Present Illness:  86 yo female from [place] visiting family presented to ER with stabbing pain in Lt chest and Lt scapula for 1 month.  Found to have large Lt pleural effusion that appeared partially loculated.  She had thoracentesis by IR on 03/11/21 with 700 ml of dark fluid removed.  Fluid analysis showed glucose 108, protein 5.4, LDH 164, WBC 2240 with 50% lymphocytes and 43% neutrophils, and cytology with reactive mesothelial cells.  Follow up CT chest on 03/11/21 showed apical scarring, 1.6 cm scarring in Rt apex, 10 x 5 mm LUL pleural based nodule, residual small volume Lt pleural effusion, Lt base consolidation.  Pertinent  Medical History  Hypertension  Significant Hospital Events: Including procedures, antibiotic start and stop dates in addition to other pertinent events   7/3-left thoracentesis, 700 cc dark bloody fluid obtained; start antibiotics  Interim History / Subjective:  Her son was present at bedside and assisted with interview.    She is feeling better.  Not having cough or sputum.  Lt chest pain improved.  Still gets discomfort if she takes deep breath.  Felt warm last night but was only 100.7, and no other temperature elevations.  Not needing supplemental oxygen.  Objective   Blood pressure 136/66, pulse 67, temperature 98.7 F (37.1 C), resp. rate 15, SpO2 97 %.        Intake/Output Summary (Last 24 hours) at 03/14/2021 1153 Last data filed at 03/14/2021 0804 Gross per 24 hour  Intake 483 ml  Output --  Net 483 ml   There were no vitals filed for this visit.  Examination:  General - alert Eyes - pupils reactive ENT - no sinus tenderness, no stridor Cardiac - regular rate/rhythm, no murmur Chest - equal breath sounds b/l, no wheezing or rales Abdomen - soft,  non tender, + bowel sounds Extremities - no cyanosis, clubbing, or edema Skin - no rashes Neuro - normal strength, moves extremities, follows commands Psych - normal mood and behavior   Resolved Hospital Problem list     Assessment & Plan:   Exudate Lt pleural effusion and Lt lower lung mass like consolidation. - complete 7 days of antibiotics, per primary team - f/u right pleural fluid culture and AFB culture from 7/03 - check quantiferon gold - f/u chest xray as outpt and if radiographic changes are improving further then will need PET scan, and if lesion positive then would need additional tissue sampling  She has outpt pulmonary follow up appointment with Tammy Parrett on 04/04/21.  PCCM will sign off.  Please call if additional assistance needed while she is in hospital.  Labs    CMP Latest Ref Rng & Units 03/13/2021 03/11/2021 03/11/2021  Glucose 70 - 99 mg/dL 111(H) 101(H) 98  BUN 8 - 23 mg/dL 27(H) 19 21  Creatinine 0.44 - 1.00 mg/dL 0.97 1.08(H) 1.11(H)  Sodium 135 - 145 mmol/L 134(L) 134(L) 133(L)  Potassium 3.5 - 5.1 mmol/L 4.7 4.2 5.0  Chloride 98 - 111 mmol/L 98 98 95(L)  CO2 22 - 32 mmol/L 28 26 25   Calcium 8.9 - 10.3 mg/dL 8.6(L) 8.8(L) 9.3  Total Protein 6.5 - 8.1 g/dL - - 7.0  Total Bilirubin 0.3 - 1.2 mg/dL - - 1.0  Alkaline Phos 38 - 126 U/L - - 88  AST 15 - 41 U/L - - 20  ALT 0 - 44 U/L - - 9    CBC Latest Ref Rng & Units 03/13/2021 03/11/2021 03/11/2021  WBC 4.0 - 10.5 K/uL 10.3 10.0 10.8(H)  Hemoglobin 12.0 - 15.0 g/dL 12.8 12.7 13.6  Hematocrit 36.0 - 46.0 % 39.9 39.8 40.6  Platelets 150 - 400 K/uL 228 242 224    Signature:  Chesley Mires, MD [place] Pager - [phone] 03/14/2021, 11:53 AM

## 2021-03-14 NOTE — Discharge Summary (Signed)
Physician Discharge Summary  Gail Fitzgerald SWF:[id number] DOB: 1935/03/28 DOA: 03/10/2021  PCP: System, Provider Not In  Admit date: 03/10/2021 Discharge date: 03/14/2021  Time spent: 37minutes  Recommendations for Outpatient Follow-up:  Pulmonary Dr. Vaughan Browner in 2-3 weeks with x-ray, follow-up QuantiFERON-TB gold   Discharge Diagnoses:  Active Problems:   Pleural effusion on left Partially loculated left pleural effusion Hypertension Dyslipidemia  Ascending aortic aneurysm Mild hyponatremia  Discharge Condition: Stable  Diet recommendation: Low-sodium  There were no vitals filed for this visit.  History of present illness:  86 year old with past medical history significant for hypertension presents complaining of back pain that is started 44-month prior to admission.  Subsequently her back pain progressed to chest pain.  The morning of admission she developed shortness of breath.  She presented to the ED In the ED: CTA showed negative PE, moderately large loculated left-sided pleural effusion, malignancy could not be excluded  Hospital Course:   Moderately Large Left partially Loculated Pleural Effusion: exudative pleural effusion. -Pulmonary consulted, underwent thoracentesis 7/03: yielding 700 dark bloody fluid. -Pleural fluid: WBC 2240, neutrophil 43, LDH 164, Protein 5.3. -Pleural fluid cytology noted reactive mesothelial cells, follow-up CT chest on 7/3 noted apical scarring and 1.6 cm scarring at the right apex, 10 X 5 mm LUL pleural-based nodule, residual small volume left pleural effusion and left base consolidation -2D echocardiogram was completed which noted noted normal EF and grade 1 diastolic dysfunction, pleural fluid culture was negative -Treated with IV antibiotics in the hospital and discharged home on oral cefdinir for 5 5 more days to complete a 7-day course -Arranged FU with Pulm in 2-3weeks with repeat CXR   HTN: Resumed lisinopril and metoprolol    Dyslipidemia: Continue with statins.   4.3 cm diameter ascending thoracic aorta: Will need follow-up  Mild Hyponatremia; monitor.  Mild diarrhea  antibiotics is associated, improving    Consultations: Pulmonary  Discharge Exam: Vitals:   03/14/21 0300 03/14/21 0743  BP: 138/63 134/64  Pulse: 70 83  Resp: (!) 21 18  Temp: 98.8 F (37.1 C) 98.8 F (37.1 C)  SpO2: 96% 96%    General: Awake alert oriented x3 Cardiovascular: S1-S2, regular rate rhythm Respiratory: Decreased breath sounds the left base  Discharge Instructions   Discharge Instructions     Diet - low sodium heart healthy   Complete by: As directed    Increase activity slowly   Complete by: As directed       Allergies as of 03/14/2021   No Known Allergies      Medication List     STOP taking these medications    ibuprofen 400 MG tablet Commonly known as: ADVIL       TAKE these medications    acetaminophen 500 MG tablet Commonly known as: TYLENOL Take 500 mg by mouth every 6 (six) hours as needed for moderate pain.   aspirin EC 81 MG tablet Take 81 mg by mouth daily. Swallow whole.   benazepril 20 MG tablet Commonly known as: LOTENSIN Take 10 mg by mouth 2 (two) times daily.   cefdinir 300 MG capsule Commonly known as: OMNICEF Take 1 capsule (300 mg total) by mouth 2 (two) times daily for 5 days.   Magnesium 400 MG Caps Take 400 mg by mouth See admin instructions. Takes 1 tablet by mouth once daily 10 days on and 10 days off.   metoprolol tartrate 100 MG tablet Commonly known as: LOPRESSOR Take 50 mg by mouth 2 (two) times daily.  NON FORMULARY Take 1 tablet by mouth once as needed (chest pain). Validol - got from [place]   NON FORMULARY Take 30 drops by mouth daily as needed (for anxiety as needed at bedtime). Valocordin - got from [place]   OVER THE COUNTER MEDICATION Take 1 tablet by mouth daily as needed (for stomach discomfort). Allochol - Multivitamin with :  Magnesium, potassium, garlic and charcoal. - got from [place]   simvastatin 20 MG tablet Commonly known as: ZOCOR Take 20 mg by mouth daily.       No Known Allergies  Follow-up Information     Mannam, Praveen, MD Follow up in 2 week(s).   Specialty: Pulmonary Disease Contact information: [place]  [address] [phone]                  The results of significant diagnostics from this hospitalization (including imaging, microbiology, ancillary and laboratory) are listed below for reference.    Significant Diagnostic Studies: DG Chest 1 View  Result Date: 03/11/2021 CLINICAL DATA:  Status post left-sided thoracentesis EXAM: CHEST  1 VIEW COMPARISON:  Preoperative radiograph obtained yesterday FINDINGS: No evidence of pneumothorax. Decreased left-sided pleural effusion. Stable cardiac and mediastinal contours. The right lung remains clear. Background bronchitic changes. Atherosclerotic calcifications noted in the transverse aorta. No acute osseous abnormality. IMPRESSION: No evidence of pneumothorax or other complication status post left thoracentesis. Decreased volume of left pleural effusion. Electronically Signed   By: Jacqulynn Cadet M.D.   On: 03/11/2021 10:14   DG Chest 2 View  Result Date: 03/10/2021 CLINICAL DATA:  Chest pain, shortness of breath. EXAM: CHEST - 2 VIEW COMPARISON:  None. FINDINGS: Stable cardiomegaly. No pneumothorax is noted. Right lung is clear. Moderate left pleural effusion is noted with probable underlying atelectasis or infiltrate. Bony thorax is unremarkable. IMPRESSION: Moderate size left pleural effusion with associated left basilar atelectasis or infiltrate. Aortic Atherosclerosis (ICD10-I70.0). Electronically Signed   By: Marijo Conception M.D.   On: 03/10/2021 09:21   CT CHEST WO CONTRAST  Result Date: 03/11/2021 CLINICAL DATA:  Pleural effusion. EXAM: CT CHEST WITHOUT CONTRAST TECHNIQUE: Multidetector CT imaging of  the chest was performed following the standard protocol without IV contrast. COMPARISON:  Insert CT a chest 03/10/2021 FINDINGS: Cardiovascular: The heart size is normal. No substantial pericardial effusion. Coronary artery calcification is evident. Atherosclerotic calcification is noted in the wall of the thoracic aorta. Ascending thoracic aorta measures 4.3 cm diameter. Mediastinum/Nodes: No mediastinal lymphadenopathy. Calcified nodal tissue is seen in the mediastinum and upper right hilum 15 mm left thyroid nodule evident. There is no axillary lymphadenopathy. Lungs/Pleura: Pleuroparenchymal scarring noted in the lung apices. 1.6 x 0.7 cm bandlike opacity in the posterior right lung apex (22/4) is probably scar. No suspicious pulmonary nodule or mass in the right lung. 10 x 5 mm left upper lobe pleural base nodule identified on 49/4. Left pleural effusion has decreased in the interval with persistent small to moderate volume complex pleural fluid on today's study and left base collapse/consolidative opacity. Lack of intravenous contrast and the lung collapse hinders assessment. Although subtle, there may be a 3.8 x 1.9 cm focus of minimally increased attenuation along the left paraspinal pleura (107/3) although no lesion was seen at this location on yesterday's CT. Upper Abdomen: 3.9 cm lesion upper pole left kidney has attenuation higher than would be expected for a simple cyst but has been incompletely visualized. Musculoskeletal: No worrisome lytic or sclerotic osseous abnormality. IMPRESSION: 1.  Interval decrease in complex left pleural fluid collection with persistent collapse/consolidative opacity in the lingula and left lower lobe. Lack of intravenous contrast on today's study limits assessment. Given unilateral disease, malignancy remains a concern and PET-CT may prove helpful to further evaluate. 2. 10 x 5 mm subtle nodular focus along the lateral left pleura, indeterminate. 3. 4.3 cm diameter ascending  thoracic aorta. Recommend annual imaging followup by CTA or MRA. This recommendation follows 2010 ACCF/AHA/AATS/ACR/ASA/SCA/SCAI/SIR/STS/SVM Guidelines for the Diagnosis and Management of Patients with Thoracic Aortic Disease. Circulation. 2010; 121: Y850-Y774. Aortic aneurysm NOS (ICD10-I71.9) 4. 1.5 cm left thyroid nodule. Recommend thyroid US (ref: J Am Coll Radiol. 2015 Feb;12(2): 143-50). 5.  Aortic Atherosclerois (ICD10-170.0) Electronically Signed   By: Misty Stanley M.D.   On: 03/11/2021 12:55   CT Angio Chest Pulmonary Embolism (PE) W or WO Contrast  Result Date: 03/10/2021 CLINICAL DATA:  Chest pain, SOB.  Rule out PE. EXAM: CT ANGIOGRAPHY CHEST WITH CONTRAST TECHNIQUE: Multidetector CT imaging of the chest was performed using the standard protocol during bolus administration of intravenous contrast. Multiplanar CT image reconstructions and MIPs were obtained to evaluate the vascular anatomy. CONTRAST:  180mL OMNIPAQUE IOHEXOL 350 MG/ML SOLN COMPARISON:  None. FINDINGS: Cardiovascular: Satisfactory opacification of the pulmonary arteries to the segmental level. No evidence of pulmonary embolism. Normal heart size. No pericardial effusion. Aortic atherosclerosis. Increase caliber the ascending thoracic aorta measures 4.1 cm, image 59/7. Coronary artery calcifications. No pericardial effusion. Mediastinum/Nodes: Normal appearance of the thyroid gland. The trachea appears patent and is midline. Normal appearance of the esophagus. No enlarged lymph nodes. Lungs/Pleura: There is a moderate to large left pleural effusion. This appears partially loculated with fluid along the oblique fissure and fluid extending over the posterior and apical portions of the left upper lobe. Passive atelectasis of the left lower lobe is identified. Complete atelectasis of the lingula is identified. There is mild pleural thickening overlying the medial right upper lobe measuring up to 6 mm in thickness. Pleural nodule overlying  the anterior basal right upper lobe measures 2.2 by 0.8 cm. Right lung appears clear. No right pleural effusion identified. Upper Abdomen: No acute abnormality within the limited imaging of the upper abdomen. Small hiatal hernia noted. Musculoskeletal: The bones appear diffusely osteopenic. No acute or suspicious osseous findings. Review of the MIP images confirms the above findings. IMPRESSION: 1. No evidence for acute pulmonary embolus. 2. Moderate to large left pleural effusion appears partially loculated. Areas of pleural thickening and nodularity are identified overlying the left upper lobe as described. Cannot exclude malignant pleural effusion. Further evaluation with diagnostic thoracentesis is advised. 3. Complete atelectasis of the lingula is identified. Although no obvious central obstructing masses identified underlying malignancy is not excluded and follow-up imaging is advised to ensure re-expansion the lingula and to exclude underlying obstructing lesion. 4. Coronary artery calcifications noted. 5. Increase caliber of the ascending thoracic aorta measuring 4.1 cm. Recommend annual imaging followup by CTA or MRA. This recommendation follows 2010 ACCF/AHA/AATS/ACR/ASA/SCA/SCAI/SIR/STS/SVM Guidelines for the Diagnosis and Management of Patients with Thoracic Aortic Disease. Circulation. 2010; 121: J287-O676. Aortic aneurysm NOS (ICD10-I71.9) Aortic Atherosclerosis (ICD10-I70.0). Electronically Signed   By: Kerby Moors M.D.   On: 03/10/2021 14:39   DG CHEST PORT 1 VIEW  Result Date: 03/13/2021 CLINICAL DATA:  Shortness of breath.  Chest pain. EXAM: PORTABLE CHEST 1 VIEW COMPARISON:  CT 03/11/2021.  Chest x-ray 03/11/2021. FINDINGS: Mediastinum and hilar structures normal. Left base infiltrate consistent pneumonia again noted. Small moderate left-sided  pleural effusion again noted. No pneumothorax. Change thoracic spine. IMPRESSION: Left base infiltrate consistent pneumonia again noted. Small to  moderate left-sided pleural effusion again noted. No pneumothorax. Electronically Signed   By: Marcello Moores  Register   On: 03/13/2021 09:58   ECHOCARDIOGRAM COMPLETE  Result Date: 03/11/2021    ECHOCARDIOGRAM REPORT   Patient Name:   Gail Fitzgerald Date of Exam: 03/11/2021 Medical Rec #:  [id number]        Height: Accession #:    [id number]       Weight: Date of Birth:  04-Mar-1935        BSA: Patient Age:    23 years         BP:           149/66 mmHg Patient Gender: F                HR:           68 bpm. Exam Location:  Inpatient Procedure: 2D Echo, Color Doppler and Cardiac Doppler Indications:    W10.27 Acute diastolic (congestive) heart failure  History:        Patient has no prior history of Echocardiogram examinations.                 Risk Factors:Hypertension.  Sonographer:    Raquel Sarna Senior RDCS Referring Phys: [id number] [place]  1. Left ventricular ejection fraction, by estimation, is 55 to 60%. The left ventricle has normal function. Left ventricular endocardial border not optimally defined to evaluate regional wall motion, but appears grossly normal based on limited visualization. Left ventricular diastolic parameters are consistent with Grade I diastolic dysfunction (impaired relaxation).  2. Right ventricular systolic function is normal. The right ventricular size is normal. There is normal pulmonary artery systolic pressure.  3. The mitral valve is normal in structure. Trivial mitral valve regurgitation. No evidence of mitral stenosis.  4. The aortic valve is tricuspid. There is mild calcification of the aortic valve. There is mild thickening of the aortic valve. Aortic valve regurgitation is mild. Mild aortic valve sclerosis is present, with no evidence of aortic valve stenosis.  5. The inferior vena cava is normal in size with greater than 50% respiratory variability, suggesting right atrial pressure of 3 mmHg. Comparison(s): No prior Echocardiogram. FINDINGS  Left Ventricle: Left  ventricular ejection fraction, by estimation, is 55 to 60%. The left ventricle has normal function. Left ventricular endocardial border not optimally defined to evaluate regional wall motion. The left ventricular internal cavity size was normal in size. There is no left ventricular hypertrophy. Left ventricular diastolic parameters are consistent with Grade I diastolic dysfunction (impaired relaxation). Right Ventricle: The right ventricular size is normal. Right vetricular wall thickness was not well visualized. Right ventricular systolic function is normal. There is normal pulmonary artery systolic pressure. The tricuspid regurgitant velocity is 2.00 m/s, and with an assumed right atrial pressure of 3 mmHg, the estimated right ventricular systolic pressure is 03.4 mmHg. Left Atrium: Left atrial size was normal in size. Right Atrium: Right atrial size was normal in size. Pericardium: There is no evidence of pericardial effusion. Mitral Valve: The mitral valve is normal in structure. There is mild thickening of the mitral valve leaflet(s). There is mild calcification of the mitral valve leaflet(s). Mild mitral annular calcification. Trivial mitral valve regurgitation. No evidence  of mitral valve stenosis. Tricuspid Valve: The tricuspid valve is normal in structure. Tricuspid valve regurgitation is trivial. Aortic Valve: The aortic valve is tricuspid.  There is mild calcification of the aortic valve. There is mild thickening of the aortic valve. Aortic valve regurgitation is mild. Mild aortic valve sclerosis is present, with no evidence of aortic valve stenosis. Pulmonic Valve: The pulmonic valve was not well visualized. Pulmonic valve regurgitation is trivial. Aorta: The aortic root is normal in size and structure. Venous: The inferior vena cava is normal in size with greater than 50% respiratory variability, suggesting right atrial pressure of 3 mmHg. IAS/Shunts: No atrial level shunt detected by color flow  Doppler.  LEFT VENTRICLE PLAX 2D LVIDd:         3.30 cm  Diastology LVIDs:         2.30 cm  LV e' medial:    4.90 cm/s LV PW:         0.60 cm  LV E/e' medial:  10.7 LV IVS:        0.90 cm  LV e' lateral:   8.16 cm/s LVOT diam:     2.00 cm  LV E/e' lateral: 6.4 LV SV:         56 LVOT Area:     3.14 cm  RIGHT VENTRICLE RV S prime:     18.10 cm/s TAPSE (M-mode): 1.9 cm LEFT ATRIUM             RIGHT ATRIUM LA diam:        2.30 cm RA Area:     13.70 cm LA Vol (A2C):   47.4 ml RA Volume:   33.60 ml LA Vol (A4C):   16.6 ml LA Biplane Vol: 27.9 ml  AORTIC VALVE LVOT Vmax:   103.00 cm/s LVOT Vmean:  62.900 cm/s LVOT VTI:    0.179 m  AORTA Ao Root diam: 3.30 cm MITRAL VALVE               TRICUSPID VALVE MV Area (PHT): 2.50 cm    TR Peak grad:   16.0 mmHg MV Decel Time: 303 msec    TR Vmax:        200.00 cm/s MV E velocity: 52.40 cm/s MV A velocity: 76.00 cm/s  SHUNTS MV E/A ratio:  0.69        Systemic VTI:  0.18 m                            Systemic Diam: 2.00 cm Gwyndolyn Kaufman MD Electronically signed by Gwyndolyn Kaufman MD Signature Date/Time: 03/11/2021/12:09:21 PM    Final    US THORACENTESIS ASP PLEURAL SPACE W/IMG GUIDE  Result Date: 03/11/2021 INDICATION: Left pleural effusion Back pain; wt loss EXAM: ULTRASOUND GUIDED LEFT THORACENTESIS MEDICATIONS: 10% 2% lidocaine. COMPLICATIONS: None immediate. PROCEDURE: An ultrasound guided thoracentesis was thoroughly discussed with the patient and questions answered. The benefits, risks, alternatives and complications were also discussed. The patient understands and wishes to proceed with the procedure. Written consent was obtained. Ultrasound was performed to localize and mark an adequate pocket of fluid in the left chest. The area was then prepped and draped in the normal sterile fashion. 1% Lidocaine was used for local anesthesia. Under ultrasound guidance a Yueh catheter was introduced. Thoracentesis was performed. The catheter was removed and a dressing applied.  FINDINGS: A total of approximately 700 cc of dark bloody fluid was removed. Samples were sent to the laboratory as requested by the clinical team. IMPRESSION: Successful ultrasound guided left thoracentesis yielding 700 cc of pleural fluid. CXR shows No PTX per Dr  Henn Read by Lavonia Drafts [facility] Electronically Signed   By: Markus Daft M.D.   On: 03/11/2021 10:02    Microbiology: Recent Results (from the past 240 hour(s))  Resp Panel by RT-PCR (Flu A&B, Covid) Nasopharyngeal Swab     Status: None   Collection Time: 03/10/21 10:50 AM   Specimen: Nasopharyngeal Swab; Nasopharyngeal(NP) swabs in vial transport medium  Result Value Ref Range Status   SARS Coronavirus 2 by RT PCR NEGATIVE NEGATIVE Final    Comment: (NOTE) SARS-CoV-2 target nucleic acids are NOT DETECTED.  The SARS-CoV-2 RNA is generally detectable in upper respiratory specimens during the acute phase of infection. The lowest concentration of SARS-CoV-2 viral copies this assay can detect is 138 copies/mL. A negative result does not preclude SARS-Cov-2 infection and should not be used as the sole basis for treatment or other patient management decisions. A negative result may occur with  improper specimen collection/handling, submission of specimen other than nasopharyngeal swab, presence of viral mutation(s) within the areas targeted by this assay, and inadequate number of viral copies(<138 copies/mL). A negative result must be combined with clinical observations, patient history, and epidemiological information. The expected result is Negative.  Fact Sheet for Patients:  EntrepreneurPulse.com.au  Fact Sheet for Healthcare Providers:  IncredibleEmployment.be  This test is no t yet approved or cleared by the [place] FDA and  has been authorized for detection and/or diagnosis of SARS-CoV-2 by FDA under an Emergency Use Authorization (EUA). This EUA will remain  in effect (meaning this  test can be used) for the duration of the COVID-19 declaration under Section 564(b)(1) of the Act, 21 U.S.C.section 360bbb-3(b)(1), unless the authorization is terminated  or revoked sooner.       Influenza A by PCR NEGATIVE NEGATIVE Final   Influenza B by PCR NEGATIVE NEGATIVE Final    Comment: (NOTE) The Xpert Xpress SARS-CoV-2/FLU/RSV plus assay is intended as an aid in the diagnosis of influenza from Nasopharyngeal swab specimens and should not be used as a sole basis for treatment. Nasal washings and aspirates are unacceptable for Xpert Xpress SARS-CoV-2/FLU/RSV testing.  Fact Sheet for Patients: EntrepreneurPulse.com.au  Fact Sheet for Healthcare Providers: IncredibleEmployment.be  This test is not yet approved or cleared by the [place] FDA and has been authorized for detection and/or diagnosis of SARS-CoV-2 by FDA under an Emergency Use Authorization (EUA). This EUA will remain in effect (meaning this test can be used) for the duration of the COVID-19 declaration under Section 564(b)(1) of the Act, 21 U.S.C. section 360bbb-3(b)(1), unless the authorization is terminated or revoked.  Performed at [facility], [place] [address], [place], [place] [address]   Body fluid culture w Gram Stain     Status: None   Collection Time: 03/11/21  9:05 AM   Specimen: Body Fluid  Result Value Ref Range Status   Specimen Description FLUID  Final   Special Requests PLEURAL LEFT  Final   Gram Stain   Final    FEW WBC PRESENT, PREDOMINANTLY PMN NO ORGANISMS SEEN    Culture   Final    NO GROWTH 3 DAYS Performed at [facility], [address] [place], [place] [address]    Report Status 03/14/2021 FINAL  Final  MRSA Next Gen by PCR, Nasal     Status: None   Collection Time: 03/13/21 12:03 PM   Specimen: Nasal Mucosa; Nasal Swab  Result Value Ref Range Status   MRSA by PCR Next Gen NOT DETECTED  NOT DETECTED Final    Comment:  (NOTE) The GeneXpert MRSA Assay (FDA approved for NASAL specimens only), is one component of a comprehensive MRSA colonization surveillance program. It is not intended to diagnose MRSA infection nor to guide or monitor treatment for MRSA infections. Test performance is not FDA approved in patients less than 10 years old. Performed at [facility], [place] [address], [place], [place] [address]      Labs: Basic Metabolic Panel: Recent Labs  Lab 03/10/21 0900 03/11/21 0151 03/11/21 2356 03/13/21 0012  NA 132* 133* 134* 134*  K 4.6 5.0 4.2 4.7  CL 98 95* 98 98  CO2 25 25 26 28   GLUCOSE 103* 98 101* 111*  BUN 20 21 19  27*  CREATININE 1.00 1.11* 1.08* 0.97  CALCIUM 8.9 9.3 8.8* 8.6*   Liver Function Tests: Recent Labs  Lab 03/10/21 1849 03/11/21 0151  AST 20 20  ALT 9 9  ALKPHOS 91 88  BILITOT 1.2 1.0  PROT 7.9 7.0  ALBUMIN 4.1 3.7   No results for input(s): LIPASE, AMYLASE in the last 168 hours. No results for input(s): AMMONIA in the last 168 hours. CBC: Recent Labs  Lab 03/10/21 0900 03/11/21 0151 03/11/21 2356 03/13/21 0012  WBC 9.9 10.8* 10.0 10.3  HGB 12.6 13.6 12.7 12.8  HCT 39.8 40.6 39.8 39.9  MCV 89.4 86.9 87.3 88.3  PLT 238 224 242 228   Cardiac Enzymes: No results for input(s): CKTOTAL, CKMB, CKMBINDEX, TROPONINI in the last 168 hours. BNP: BNP (last 3 results) No results for input(s): BNP in the last 8760 hours.  ProBNP (last 3 results) No results for input(s): PROBNP in the last 8760 hours.  CBG: No results for input(s): GLUCAP in the last 168 hours.     Signed:  Domenic Polite MD.  [facility] 03/14/2021, 10:50 AM

## 2021-03-14 NOTE — Plan of Care (Signed)
  Problem: Education: Goal: Knowledge of General Education information will improve Description: Including pain rating scale, medication(s)/side effects and non-pharmacologic comfort measures Outcome: Adequate for Discharge   Problem: Health Behavior/Discharge Planning: Goal: Ability to manage health-related needs will improve Outcome: Adequate for Discharge   Problem: Clinical Measurements: Goal: Ability to maintain clinical measurements within normal limits will improve Outcome: Adequate for Discharge Goal: Will remain free from infection Outcome: Adequate for Discharge Goal: Diagnostic test results will improve Outcome: Adequate for Discharge Goal: Respiratory complications will improve Outcome: Adequate for Discharge Goal: Cardiovascular complication will be avoided Outcome: Adequate for Discharge   Problem: Activity: Goal: Risk for activity intolerance will decrease Outcome: Adequate for Discharge   Problem: Nutrition: Goal: Adequate nutrition will be maintained Outcome: Adequate for Discharge   Problem: Coping: Goal: Level of anxiety will decrease Outcome: Adequate for Discharge   Problem: Elimination: Goal: Will not experience complications related to bowel motility Outcome: Adequate for Discharge Goal: Will not experience complications related to urinary retention Outcome: Adequate for Discharge   Problem: Pain Managment: Goal: General experience of comfort will improve Outcome: Adequate for Discharge   Problem: Safety: Goal: Ability to remain free from injury will improve Outcome: Adequate for Discharge   Problem: Skin Integrity: Goal: Risk for impaired skin integrity will decrease Outcome: Adequate for Discharge   Problem: Acute Rehab PT Goals(only PT should resolve) Goal: Patient Will Transfer Sit To/From Stand Outcome: Adequate for Discharge Goal: Pt Will Ambulate Outcome: Adequate for Discharge Goal: Pt Will Go Up/Down Stairs Outcome: Adequate for  Discharge   Problem: Acute Rehab OT Goals (only OT should resolve) Goal: Pt. Will Perform Grooming Outcome: Adequate for Discharge Goal: Pt. Will Perform Lower Body Dressing Outcome: Adequate for Discharge Goal: Pt. Will Transfer To Toilet Outcome: Adequate for Discharge Goal: Pt. Will Perform Toileting-Clothing Manipulation Outcome: Adequate for Discharge   Problem: Acute Rehab OT Goals (only OT should resolve) Goal: Pt. Will Perform Tub/Shower Transfer Outcome: Adequate for Discharge

## 2021-03-14 NOTE — Telephone Encounter (Signed)
Appt scheduled for 04/04/2021 at 11:00 with CXR prior. CXR ordered.  Patient's daughter in law, Rayford Halsted is aware and voiced her understanding. Rayford Halsted stated that she interprets for patient, therefore no interpreter is needed. Nothing further needed at this time.

## 2021-03-14 NOTE — Telephone Encounter (Signed)
-----   Message from Marshell Garfinkel, MD sent at 03/11/2021 12:10 PM EDT ----- Regarding: Hospital follow up Please make follow up in pulmonary clinic with me or APP in 2-4 weeks with chest x ray

## 2021-03-16 LAB — ACID FAST SMEAR (AFB, MYCOBACTERIA): Acid Fast Smear: NEGATIVE

## 2021-03-17 LAB — QUANTIFERON-TB GOLD PLUS (RQFGPL)
QuantiFERON Mitogen Value: >10 IU/mL
QuantiFERON Nil Value: 0 IU/mL
QuantiFERON TB1 Ag Value: 0.63 IU/mL
QuantiFERON TB2 Ag Value: 0.77 IU/mL

## 2021-03-17 LAB — QUANTIFERON-TB GOLD PLUS: QuantiFERON-TB Gold Plus: POSITIVE — AB

## 2021-03-26 ENCOUNTER — Ambulatory Visit: Payer: Medicaid Other | Attending: Internal Medicine | Admitting: Physical Therapy

## 2021-03-26 ENCOUNTER — Encounter: Payer: Self-pay | Admitting: Physical Therapy

## 2021-03-26 ENCOUNTER — Other Ambulatory Visit: Payer: Self-pay

## 2021-03-26 DIAGNOSIS — Z7409 Other reduced mobility: Secondary | ICD-10-CM

## 2021-03-26 DIAGNOSIS — M6281 Muscle weakness (generalized): Secondary | ICD-10-CM | POA: Insufficient documentation

## 2021-03-26 NOTE — Patient Instructions (Signed)
Access Code: [email or web address] URL: [email or web address] Date: 03/26/2021 Prepared by: Raeford Razor  Exercises Supine Lower Trunk Rotation - 2 x daily - 7 x weekly - 1 sets - 5 reps - 30 hold Thoracic Sidebending with Towel Roll - 2 x daily - 7 x weekly - 1 sets - 5 reps - 30 hold Sidelying Upper Thoracic Rotation - 2 x daily - 7 x weekly - 1 sets - 5 reps - 30 hold

## 2021-03-26 NOTE — Therapy (Signed)
[place], [place], [address] Phone: [phone]   Fax:  [phone]  Physical Therapy Evaluation  Patient Details  Name: Gail Fitzgerald MRN: [id number] Date of Birth: Apr 13, 1935 Referring Provider (PT): Dr Domenic Polite   Encounter Date: 03/26/2021   PT End of Session - 03/26/21 0955     Visit Number 1    Number of Visits 8   6 weeks for 8 visit s   Date for PT Re-Evaluation 05/07/21    Authorization Type [place] [employer]    PT Start Time 0935    PT Stop Time 1020    PT Time Calculation (min) 45 min             Past Medical History:  Diagnosis Date   Hypertension     History reviewed. No pertinent surgical history.  There were no vitals filed for this visit.    Subjective Assessment - 03/26/21 0951     Subjective Patient was admitted to the hospital for pleural effusion , DC 03/14/21.  She now reports decreased energy, pain in back and chest, ribs continues.  She is used to having intermittent back pain in the past but this more constant.  She used to walk about 2 miles per day.  She is now very limited in her ability to stand, walk and maintain balance at times. She has difficulty walking even short distances, feels weak. Her room is onthe 2nd floor and can do it but is very tired. She lives with her son and DIL and has not been back to [place] since before [place] pandemic.    Patient is accompained by: Family member   [place] interpreting   Pertinent History Rt rotator cuff    Limitations House hold activities;Lifting;Standing;Walking    Diagnostic tests none    Patient Stated Goals Feel more energy    Currently in Pain? Yes    Pain Score 7    pain vs discomfort   Pain Location Back    Pain Orientation Left;Lower;Mid;Anterior;Posterior;Other (Comment)    Pain Descriptors / Indicators Tightness;Sore;Aching    Pain Type Chronic pain   rib pain is acute, back is chronic.   Pain Radiating  Towards back to ribs on L side under breast    Pain Onset More than a month ago    Pain Frequency Intermittent    Aggravating Factors  activity    Pain Relieving Factors stretching overhead    Effect of Pain on Daily Activities painful to walk, worried about what the pain is    Multiple Pain Sites No                [facility] PT Assessment - 03/26/21 0001       Assessment   Medical Diagnosis unsteadiness, weakness    Referring Provider (PT) Dr Domenic Polite    Onset Date/Surgical Date 03/14/21   adm 7/2-7/6   Prior Therapy Yes for shoulder      Precautions   Precautions None    Precaution Comments symptomatic with HTN , monitor as need      Restrictions   Weight Bearing Restrictions No      Balance Screen   Has the patient fallen in the past 6 months No      [place] residence    Living Arrangements Children    Type of [place] to enter    Entrance [place] of  Steps 14    Entrance Stairs-Rails Can reach both    [place] - 2 wheels      Prior Function   Level of Independence Independent with basic ADLs    Vocation Retired    Leisure gardening, family      Cognition   Overall Cognitive Status Within Functional Limits for tasks assessed      Observation/Other Assessments   Focus on Therapeutic Outcomes (FOTO)  NT MCD      Sensation   Light Touch Appears Intact      Coordination   Gross Motor Movements are Fluid and Coordinated Not tested      Functional Tests   Functional tests Squat;Single leg stance      Squat   Comments no pain      Single Leg Stance   Comments incr back pain , < 5 sec      Posture/Postural Control   Posture/Postural Control Postural limitations    Postural Limitations Rounded Shoulders;Forward head;Increased thoracic kyphosis      AROM   Lumbar Flexion pain , 50%    Lumbar Extension NT    Lumbar - Right Rotation 50%    Lumbar - Left Rotation 50%  pain      Strength   Overall Strength Comments overall LE WNL in sitting      Transfers   Five time sit to stand comments  15 sec      Ambulation/Gait   Gait Comments 2 min walk test 190 feet, no device , O2 sats stable and HR 80's                        Objective measurements completed on examination: See above findings.       [facility] Adult PT Treatment/Exercise - 03/26/21 0001       Self-Care   Self-Care Other Self-Care Comments;Posture    Other Self-Care Comments  encouraged deep breathing and opening ribs, HEP, POC , ribs      Lumbar Exercises: Stretches   Lower Trunk Rotation 10 seconds    Lower Trunk Rotation Limitations x5 each side arms in goal post    Other Lumbar Stretch Exercise arms overhead, for elongation    Other Lumbar Stretch Exercise sidelying chest opener, upper trunk rotation                    PT Education - 03/26/21 1945     Education Details PT/POC, self care    Person(s) Educated Patient    Methods Explanation;Handout;Verbal cues;Demonstration    Comprehension Verbalized understanding;Returned demonstration                 PT Long Term Goals - 03/26/21 1946       PT LONG TERM GOAL #1   Title Pt will be Ind in a final HEP for stretching and strengthening    Baseline given on eval    Time 6    Period Weeks    Status New    Target Date 05/22/21      PT LONG TERM GOAL #2   Title Pt will report household and community ambulation, endurance improved for that of pre-hopsitalization level    Baseline very limited per patient and daughter in law    Time 6    Period Weeks    Status New    Target Date 05/07/21      PT LONG TERM GOAL #3   Title Pt  will report completion of household actiities and/or gardening with pain level not exceeding 3/10 in back, min fatigue.    Baseline mod to severe    Time 6    Period Weeks    Status New    Target Date 05/07/21      PT LONG TERM GOAL #4   Title Pt will be able to  increase 2 min walk test distance to 240 feet or more    Baseline 190 feet    Time 6    Period Weeks    Status New    Target Date 05/07/21                    Plan - 03/26/21 0955     Clinical Impression Statement Patient presents for low complexity eval for recent onset of weakness and fatigue, s/p hopsitalization for L lung effusion.  She was in the hospital for 4 days, discharged to her family's home.  She fatigues quickly but does try to walk in her home including stairs.  She has decent LE strength.  Will further assess her balance next visit.  She is concerned about back and rib pain, I assured her it is likely musculoskeletal in nature and tightness of rib cage with labored breathing is contributing.  She should do quite well with PT intervention for breathing exercises, postural stretching and strengthening, endurance.    Personal Factors and Comorbidities Age;Comorbidity 2    Comorbidities hospitalization, HTN    Examination-Activity Limitations Bathing;Squat;Stairs;Lift;Locomotion Level;Bend;Stand    Examination-Participation Restrictions Cleaning;Community Activity;Yard Work;Shop;Laundry    Clinical Decision Making Low    Rehab Potential Good    PT Frequency 2x / week    PT Duration 4 weeks    PT Treatment/Interventions ADLs/Self Care Home Management;Moist Heat;Gait training;Functional mobility training;Balance training;Therapeutic exercise;Therapeutic activities;Manual techniques;Passive range of motion;Cryotherapy    PT Next Visit Plan check stretches/HEP.  Wall for posture an strength, UBE reverse    PT Home Exercise Plan [email or web address]    Consulted and Agree with Plan of Care Patient;Family member/caregiver    Family Member Consulted DIL             Patient will benefit from skilled therapeutic intervention in order to improve the following deficits and impairments:  Decreased endurance, Decreased mobility, Difficulty walking, Cardiopulmonary status limiting  activity, Decreased activity tolerance, Decreased strength, Postural dysfunction, Pain, Impaired flexibility, Impaired UE functional use, Decreased scar mobility, Decreased range of motion  Visit Diagnosis: Impaired mobility and endurance  Muscle weakness (generalized)     Problem List Patient Active Problem List   Diagnosis Date Noted   Pleural effusion on left 03/10/2021    Jonhatan Hearty 03/26/2021, 8:16 PM  [facility] [address] [place], [place], [address] Phone: [phone]   Fax:  [phone]  Name: Gail Fitzgerald MRN: [id number] Date of Birth: Nov 21, 1934  Raeford Razor, PT 03/26/21 8:16 PM Phone: [phone] Fax: [phone]

## 2021-04-03 ENCOUNTER — Ambulatory Visit: Payer: Medicaid Other

## 2021-04-04 ENCOUNTER — Other Ambulatory Visit: Payer: Self-pay

## 2021-04-04 ENCOUNTER — Encounter: Payer: Self-pay | Admitting: Adult Health

## 2021-04-04 ENCOUNTER — Ambulatory Visit (INDEPENDENT_AMBULATORY_CARE_PROVIDER_SITE_OTHER): Payer: Medicaid Other

## 2021-04-04 ENCOUNTER — Ambulatory Visit: Payer: Medicaid Other | Admitting: Adult Health

## 2021-04-04 VITALS — BP 130/72 | HR 65 | Temp 97.9°F | Ht 61.0 in | Wt 122.0 lb

## 2021-04-04 DIAGNOSIS — J90 Pleural effusion, not elsewhere classified: Secondary | ICD-10-CM | POA: Diagnosis not present

## 2021-04-04 NOTE — Assessment & Plan Note (Addendum)
Recurrent left pleural effusion.  Chest x-ray today shows very large left-sided pleural effusion.  She had reaccumulation since thoracentesis earlier this month.  Previous effusion was exudative in nature.  Cytology and cultures were negative. She has completed a full course of antibiotics.  Previous CT postthoracentesis showed left basilar consolidation versus possible mass. Patient will need a PET scan.  Prior to being able to do this will need a thoracentesis We went over her instructions for thoracentesis as she had done before.  She has not on anticoagulation.  Is on aspirin therapy only. Will do PET scan following thoracentesis if can be coordinated.  Discussed with Dr. Vaughan Browner will have close follow up in office post tap.   We will set patient up for a thoracentesis interventional radiology.  Send pleural fluid for cytology.

## 2021-04-04 NOTE — Patient Instructions (Signed)
Set up with Interventional Radiology for Left sided thoracentesis  PET Scan right after thoracentesis .  Pleural fluid to lab.  Follow up with Dr. Vaughan Browner in 2 weeks and As needed  with chest xray .  Please contact office for sooner follow up if symptoms do not improve or worsen or seek emergency care

## 2021-04-04 NOTE — Progress Notes (Signed)
@Patient  ID: Gail Fitzgerald, female    DOB: 06-26-1935, 86 y.o.   MRN: [id number]  Chief Complaint  Patient presents with   Follow-up    Referring provider: No ref. provider found  HPI: 86 year old female seen for pulmonary consult March 11, 2021 for large left pleural effusion She is from [place] does not speak any English  TEST/EVENTS :  Interventional radiology thoracentesis March 11, 2021 with 700 cc of dark fluid removed.  Fluid analysis showed glucose 108, protein 5.4, LDH 164, WBC 20-40 with 50% lymphs and 43% neutrophils and cytology with reactive mesothelial cells. CT chest March 11, 2021 showed apical scarring, 1.6 cm scarring in the right apex, 10 x 5 mm left upper pleural-based nodule, residual volume in the left pleural effusion and left base consolidation   04/04/2021 Follow up : Left pleural effusion Patient was seen for pulmonary consult March 11, 2021 for large left pleural effusion.  She underwent thoracentesis via interventional radiology.  700 cc of dark fluid was removed.  Fluid analysis showed exudative left pleural effusion.  She was treated with 7-day course of antibiotics.  QuantiFERON gold plus was positive, pleural fluid AFB culture was negative..  No signs of active TB on CT.Marland Kitchen Pleural fluid culture was negative, pleural fluid AFB was negative pleural fluid fungus was negative.  Pleural fluid cytology showed atypical cells favored reactive mesothelial cells Since discharge patient says that her breathing is not as good.  Has low energy feels fatigued. Has had weight loss over last 6 months -8-10lbs. Feels short of breath and tightness under ribs.  Low appetite . No hemotpysis .  Patient is only on aspirin.  No anticoagulation.  No known history of cancer.  Mammogram is up-to-date.  Patient's daughter says that colonoscopy was not recommended due to her age.  She is a never smoker  She is accompanied by her daughter who translate and also a interpreter is present for  today's visit.  No Known Allergies  Immunization History  Administered Date(s) Administered   Influenza-Unspecified 07/10/2020   PFIZER(Purple Top)SARS-COV-2 Vaccination 10/31/2019, 11/27/2019, 07/29/2020, 12/22/2020    Past Medical History:  Diagnosis Date   Hypertension     Tobacco History: Social History   Tobacco Use  Smoking Status Never  Smokeless Tobacco Never   Counseling given: Not Answered   Outpatient Medications Prior to Visit  Medication Sig Dispense Refill   acetaminophen (TYLENOL) 500 MG tablet Take 500 mg by mouth every 6 (six) hours as needed for moderate pain.     aspirin EC 81 MG tablet Take 81 mg by mouth daily. Swallow whole.     benazepril (LOTENSIN) 10 MG tablet Take 10 mg by mouth 2 (two) times daily.     Magnesium 400 MG CAPS Take 400 mg by mouth See admin instructions. Takes 1 tablet by mouth once daily 10 days on and 10 days off.     metoprolol succinate (TOPROL-XL) 100 MG 24 hr tablet Take 100 mg by mouth in the morning and at bedtime.     NON FORMULARY Take 1 tablet by mouth once as needed (chest pain). Validol - got from [place]     NON FORMULARY Take 30 drops by mouth daily as needed (for anxiety as needed at bedtime). Valocordin - got from [place]     OVER THE COUNTER MEDICATION Take 1 tablet by mouth daily as needed (for stomach discomfort). Allochol - Multivitamin with : Magnesium, potassium, garlic and charcoal. - got from [place]  simvastatin (ZOCOR) 20 MG tablet Take 20 mg by mouth daily.     benazepril (LOTENSIN) 20 MG tablet Take 10 mg by mouth 2 (two) times daily.     metoprolol tartrate (LOPRESSOR) 100 MG tablet Take 50 mg by mouth 2 (two) times daily.     No facility-administered medications prior to visit.     Review of Systems:   Constitutional:   + weight loss, no night sweats,  Fevers, chills,  +fatigue, or  lassitude.  HEENT:   No headaches,  Difficulty swallowing,  Tooth/dental problems, or  Sore throat,                 No sneezing, itching, ear ache, nasal congestion, post nasal drip,   CV:  No chest pain,  Orthopnea, PND, swelling in lower extremities, anasarca, dizziness, palpitations, syncope.   GI  No heartburn, indigestion, abdominal pain, nausea, vomiting, diarrhea, change in bowel habits, loss of appetite, bloody stools.   Resp: .  No chest wall deformity  Skin: no rash or lesions.  GU: no dysuria, change in color of urine, no urgency or frequency.  No flank pain, no hematuria   MS:  No joint pain or swelling.  No decreased range of motion.  No back pain.    Physical Exam  BP 130/72 (BP Location: Left Arm, Patient Position: Sitting, Cuff Size: Normal)   Pulse 65   Temp 97.9 F (36.6 C) (Oral)   Ht 5\' 1"  (1.549 m)   Wt 122 lb (55.3 kg)   SpO2 97%   BMI 23.05 kg/m   GEN: A/Ox3; pleasant , NAD, thin and elderly    HEENT:  [place]/AT,   NOSE-clear, THROAT-clear, no lesions, no postnasal drip or exudate noted.   NECK:  Supple w/ fair ROM; no JVD; normal carotid impulses w/o bruits; no thyromegaly or nodules palpated; no lymphadenopathy.    RESP decreased breath sounds on the left  no accessory muscle use, no dullness to percussion  CARD:  RRR, no m/r/g, no peripheral edema, pulses intact, no cyanosis or clubbing.  GI:   Soft & nt; nml bowel sounds; no organomegaly or masses detected.   Musco: Warm bil, no deformities or joint swelling noted.   Neuro: alert, no focal deficits noted.    Skin: Warm, no lesions or rashes    Lab Results:    BNP No results found for: BNP  ProBNP No results found for: PROBNP  Imaging: DG Chest 1 View  Result Date: 03/11/2021 CLINICAL DATA:  Status post left-sided thoracentesis EXAM: CHEST  1 VIEW COMPARISON:  Preoperative radiograph obtained yesterday FINDINGS: No evidence of pneumothorax. Decreased left-sided pleural effusion. Stable cardiac and mediastinal contours. The right lung remains clear. Background bronchitic changes. Atherosclerotic  calcifications noted in the transverse aorta. No acute osseous abnormality. IMPRESSION: No evidence of pneumothorax or other complication status post left thoracentesis. Decreased volume of left pleural effusion. Electronically Signed   By: Jacqulynn Cadet M.D.   On: 03/11/2021 10:14   DG Chest 2 View  Result Date: 04/04/2021 CLINICAL DATA:  Pleural effusion EXAM: CHEST - 2 VIEW COMPARISON:  03/13/2021 FINDINGS: Enlarging left pleural effusion, now large. No effusion or confluent opacity on the right. Cardiomegaly. Aortic atherosclerosis. No acute bony abnormality. IMPRESSION: Enlarging left pleural effusion, now large. Electronically Signed   By: Rolm Baptise M.D.   On: 04/04/2021 11:01   DG Chest 2 View  Result Date: 03/10/2021 CLINICAL DATA:  Chest pain, shortness of breath. EXAM: CHEST -  2 VIEW COMPARISON:  None. FINDINGS: Stable cardiomegaly. No pneumothorax is noted. Right lung is clear. Moderate left pleural effusion is noted with probable underlying atelectasis or infiltrate. Bony thorax is unremarkable. IMPRESSION: Moderate size left pleural effusion with associated left basilar atelectasis or infiltrate. Aortic Atherosclerosis (ICD10-I70.0). Electronically Signed   By: Marijo Conception M.D.   On: 03/10/2021 09:21   CT CHEST WO CONTRAST  Result Date: 03/11/2021 CLINICAL DATA:  Pleural effusion. EXAM: CT CHEST WITHOUT CONTRAST TECHNIQUE: Multidetector CT imaging of the chest was performed following the standard protocol without IV contrast. COMPARISON:  Insert CT a chest 03/10/2021 FINDINGS: Cardiovascular: The heart size is normal. No substantial pericardial effusion. Coronary artery calcification is evident. Atherosclerotic calcification is noted in the wall of the thoracic aorta. Ascending thoracic aorta measures 4.3 cm diameter. Mediastinum/Nodes: No mediastinal lymphadenopathy. Calcified nodal tissue is seen in the mediastinum and upper right hilum 15 mm left thyroid nodule evident. There is  no axillary lymphadenopathy. Lungs/Pleura: Pleuroparenchymal scarring noted in the lung apices. 1.6 x 0.7 cm bandlike opacity in the posterior right lung apex (22/4) is probably scar. No suspicious pulmonary nodule or mass in the right lung. 10 x 5 mm left upper lobe pleural base nodule identified on 49/4. Left pleural effusion has decreased in the interval with persistent small to moderate volume complex pleural fluid on today's study and left base collapse/consolidative opacity. Lack of intravenous contrast and the lung collapse hinders assessment. Although subtle, there may be a 3.8 x 1.9 cm focus of minimally increased attenuation along the left paraspinal pleura (107/3) although no lesion was seen at this location on yesterday's CT. Upper Abdomen: 3.9 cm lesion upper pole left kidney has attenuation higher than would be expected for a simple cyst but has been incompletely visualized. Musculoskeletal: No worrisome lytic or sclerotic osseous abnormality. IMPRESSION: 1. Interval decrease in complex left pleural fluid collection with persistent collapse/consolidative opacity in the lingula and left lower lobe. Lack of intravenous contrast on today's study limits assessment. Given unilateral disease, malignancy remains a concern and PET-CT may prove helpful to further evaluate. 2. 10 x 5 mm subtle nodular focus along the lateral left pleura, indeterminate. 3. 4.3 cm diameter ascending thoracic aorta. Recommend annual imaging followup by CTA or MRA. This recommendation follows 2010 ACCF/AHA/AATS/ACR/ASA/SCA/SCAI/SIR/STS/SVM Guidelines for the Diagnosis and Management of Patients with Thoracic Aortic Disease. Circulation. 2010; 121: V425-Z563. Aortic aneurysm NOS (ICD10-I71.9) 4. 1.5 cm left thyroid nodule. Recommend thyroid US (ref: J Am Coll Radiol. 2015 Feb;12(2): 143-50). 5.  Aortic Atherosclerois (ICD10-170.0) Electronically Signed   By: Misty Stanley M.D.   On: 03/11/2021 12:55   CT Angio Chest Pulmonary  Embolism (PE) W or WO Contrast  Result Date: 03/10/2021 CLINICAL DATA:  Chest pain, SOB.  Rule out PE. EXAM: CT ANGIOGRAPHY CHEST WITH CONTRAST TECHNIQUE: Multidetector CT imaging of the chest was performed using the standard protocol during bolus administration of intravenous contrast. Multiplanar CT image reconstructions and MIPs were obtained to evaluate the vascular anatomy. CONTRAST:  135mL OMNIPAQUE IOHEXOL 350 MG/ML SOLN COMPARISON:  None. FINDINGS: Cardiovascular: Satisfactory opacification of the pulmonary arteries to the segmental level. No evidence of pulmonary embolism. Normal heart size. No pericardial effusion. Aortic atherosclerosis. Increase caliber the ascending thoracic aorta measures 4.1 cm, image 59/7. Coronary artery calcifications. No pericardial effusion. Mediastinum/Nodes: Normal appearance of the thyroid gland. The trachea appears patent and is midline. Normal appearance of the esophagus. No enlarged lymph nodes. Lungs/Pleura: There is a moderate to large left pleural  effusion. This appears partially loculated with fluid along the oblique fissure and fluid extending over the posterior and apical portions of the left upper lobe. Passive atelectasis of the left lower lobe is identified. Complete atelectasis of the lingula is identified. There is mild pleural thickening overlying the medial right upper lobe measuring up to 6 mm in thickness. Pleural nodule overlying the anterior basal right upper lobe measures 2.2 by 0.8 cm. Right lung appears clear. No right pleural effusion identified. Upper Abdomen: No acute abnormality within the limited imaging of the upper abdomen. Small hiatal hernia noted. Musculoskeletal: The bones appear diffusely osteopenic. No acute or suspicious osseous findings. Review of the MIP images confirms the above findings. IMPRESSION: 1. No evidence for acute pulmonary embolus. 2. Moderate to large left pleural effusion appears partially loculated. Areas of pleural  thickening and nodularity are identified overlying the left upper lobe as described. Cannot exclude malignant pleural effusion. Further evaluation with diagnostic thoracentesis is advised. 3. Complete atelectasis of the lingula is identified. Although no obvious central obstructing masses identified underlying malignancy is not excluded and follow-up imaging is advised to ensure re-expansion the lingula and to exclude underlying obstructing lesion. 4. Coronary artery calcifications noted. 5. Increase caliber of the ascending thoracic aorta measuring 4.1 cm. Recommend annual imaging followup by CTA or MRA. This recommendation follows 2010 ACCF/AHA/AATS/ACR/ASA/SCA/SCAI/SIR/STS/SVM Guidelines for the Diagnosis and Management of Patients with Thoracic Aortic Disease. Circulation. 2010; 121: Z610-R604. Aortic aneurysm NOS (ICD10-I71.9) Aortic Atherosclerosis (ICD10-I70.0). Electronically Signed   By: Kerby Moors M.D.   On: 03/10/2021 14:39   DG CHEST PORT 1 VIEW  Result Date: 03/13/2021 CLINICAL DATA:  Shortness of breath.  Chest pain. EXAM: PORTABLE CHEST 1 VIEW COMPARISON:  CT 03/11/2021.  Chest x-ray 03/11/2021. FINDINGS: Mediastinum and hilar structures normal. Left base infiltrate consistent pneumonia again noted. Small moderate left-sided pleural effusion again noted. No pneumothorax. Change thoracic spine. IMPRESSION: Left base infiltrate consistent pneumonia again noted. Small to moderate left-sided pleural effusion again noted. No pneumothorax. Electronically Signed   By: Marcello Moores  Register   On: 03/13/2021 09:58   ECHOCARDIOGRAM COMPLETE  Result Date: 03/11/2021    ECHOCARDIOGRAM REPORT   Patient Name:   SIBYL MIKULA Date of Exam: 03/11/2021 Medical Rec #:  [id number]        Height: Accession #:    [id number]       Weight: Date of Birth:  1934-10-28        BSA: Patient Age:    53 years         BP:           149/66 mmHg Patient Gender: F                HR:           68 bpm. Exam Location:  Inpatient  Procedure: 2D Echo, Color Doppler and Cardiac Doppler Indications:    Y86.57 Acute diastolic (congestive) heart failure  History:        Patient has no prior history of Echocardiogram examinations.                 Risk Factors:Hypertension.  Sonographer:    Raquel Sarna Senior RDCS Referring Phys: [id number] [place]  1. Left ventricular ejection fraction, by estimation, is 55 to 60%. The left ventricle has normal function. Left ventricular endocardial border not optimally defined to evaluate regional wall motion, but appears grossly normal based on limited visualization. Left ventricular diastolic parameters are consistent with Grade I  diastolic dysfunction (impaired relaxation).  2. Right ventricular systolic function is normal. The right ventricular size is normal. There is normal pulmonary artery systolic pressure.  3. The mitral valve is normal in structure. Trivial mitral valve regurgitation. No evidence of mitral stenosis.  4. The aortic valve is tricuspid. There is mild calcification of the aortic valve. There is mild thickening of the aortic valve. Aortic valve regurgitation is mild. Mild aortic valve sclerosis is present, with no evidence of aortic valve stenosis.  5. The inferior vena cava is normal in size with greater than 50% respiratory variability, suggesting right atrial pressure of 3 mmHg. Comparison(s): No prior Echocardiogram. FINDINGS  Left Ventricle: Left ventricular ejection fraction, by estimation, is 55 to 60%. The left ventricle has normal function. Left ventricular endocardial border not optimally defined to evaluate regional wall motion. The left ventricular internal cavity size was normal in size. There is no left ventricular hypertrophy. Left ventricular diastolic parameters are consistent with Grade I diastolic dysfunction (impaired relaxation). Right Ventricle: The right ventricular size is normal. Right vetricular wall thickness was not well visualized. Right  ventricular systolic function is normal. There is normal pulmonary artery systolic pressure. The tricuspid regurgitant velocity is 2.00 m/s, and with an assumed right atrial pressure of 3 mmHg, the estimated right ventricular systolic pressure is 00.1 mmHg. Left Atrium: Left atrial size was normal in size. Right Atrium: Right atrial size was normal in size. Pericardium: There is no evidence of pericardial effusion. Mitral Valve: The mitral valve is normal in structure. There is mild thickening of the mitral valve leaflet(s). There is mild calcification of the mitral valve leaflet(s). Mild mitral annular calcification. Trivial mitral valve regurgitation. No evidence  of mitral valve stenosis. Tricuspid Valve: The tricuspid valve is normal in structure. Tricuspid valve regurgitation is trivial. Aortic Valve: The aortic valve is tricuspid. There is mild calcification of the aortic valve. There is mild thickening of the aortic valve. Aortic valve regurgitation is mild. Mild aortic valve sclerosis is present, with no evidence of aortic valve stenosis. Pulmonic Valve: The pulmonic valve was not well visualized. Pulmonic valve regurgitation is trivial. Aorta: The aortic root is normal in size and structure. Venous: The inferior vena cava is normal in size with greater than 50% respiratory variability, suggesting right atrial pressure of 3 mmHg. IAS/Shunts: No atrial level shunt detected by color flow Doppler.  LEFT VENTRICLE PLAX 2D LVIDd:         3.30 cm  Diastology LVIDs:         2.30 cm  LV e' medial:    4.90 cm/s LV PW:         0.60 cm  LV E/e' medial:  10.7 LV IVS:        0.90 cm  LV e' lateral:   8.16 cm/s LVOT diam:     2.00 cm  LV E/e' lateral: 6.4 LV SV:         56 LVOT Area:     3.14 cm  RIGHT VENTRICLE RV S prime:     18.10 cm/s TAPSE (M-mode): 1.9 cm LEFT ATRIUM             RIGHT ATRIUM LA diam:        2.30 cm RA Area:     13.70 cm LA Vol (A2C):   47.4 ml RA Volume:   33.60 ml LA Vol (A4C):   16.6 ml LA  Biplane Vol: 27.9 ml  AORTIC VALVE LVOT Vmax:   103.00 cm/s  LVOT Vmean:  62.900 cm/s LVOT VTI:    0.179 m  AORTA Ao Root diam: 3.30 cm MITRAL VALVE               TRICUSPID VALVE MV Area (PHT): 2.50 cm    TR Peak grad:   16.0 mmHg MV Decel Time: 303 msec    TR Vmax:        200.00 cm/s MV E velocity: 52.40 cm/s MV A velocity: 76.00 cm/s  SHUNTS MV E/A ratio:  0.69        Systemic VTI:  0.18 m                            Systemic Diam: 2.00 cm Gwyndolyn Kaufman MD Electronically signed by Gwyndolyn Kaufman MD Signature Date/Time: 03/11/2021/12:09:21 PM    Final    US THORACENTESIS ASP PLEURAL SPACE W/IMG GUIDE  Result Date: 03/11/2021 INDICATION: Left pleural effusion Back pain; wt loss EXAM: ULTRASOUND GUIDED LEFT THORACENTESIS MEDICATIONS: 10% 2% lidocaine. COMPLICATIONS: None immediate. PROCEDURE: An ultrasound guided thoracentesis was thoroughly discussed with the patient and questions answered. The benefits, risks, alternatives and complications were also discussed. The patient understands and wishes to proceed with the procedure. Written consent was obtained. Ultrasound was performed to localize and mark an adequate pocket of fluid in the left chest. The area was then prepped and draped in the normal sterile fashion. 1% Lidocaine was used for local anesthesia. Under ultrasound guidance a Yueh catheter was introduced. Thoracentesis was performed. The catheter was removed and a dressing applied. FINDINGS: A total of approximately 700 cc of dark bloody fluid was removed. Samples were sent to the laboratory as requested by the clinical team. IMPRESSION: Successful ultrasound guided left thoracentesis yielding 700 cc of pleural fluid. CXR shows No PTX per Dr Anselm Pancoast Read by Lavonia Drafts [facility] Electronically Signed   By: Markus Daft M.D.   On: 03/11/2021 10:02      No flowsheet data found.  No results found for: NITRICOXIDE      Assessment & Plan:   Pleural effusion on left Recurrent left pleural effusion.   Chest x-ray today shows very large left-sided pleural effusion.  She had reaccumulation since thoracentesis earlier this month.  Previous effusion was exudative in nature.  Cytology and cultures were negative. She has completed a full course of antibiotics.  Previous CT postthoracentesis showed left basilar consolidation versus possible mass. Patient will need a PET scan.  Prior to being able to do this will need a thoracentesis We went over her instructions for thoracentesis as she had done before.  She has not on anticoagulation.  Is on aspirin therapy only. Will do PET scan following thoracentesis if can be coordinated.  Discussed with Dr. Vaughan Browner will have close follow up in office post tap.   We will set patient up for a thoracentesis interventional radiology.  Send pleural fluid for cytology.    I spent   35 minutes dedicated to the care of this patient on the date of this encounter to include pre-visit review of records, face-to-face time with the patient discussing conditions above, post visit ordering of testing, clinical documentation with the electronic health record, making appropriate referrals as documented, and communicating necessary findings to members of the patients care team.   Rexene Edison, NP 04/04/2021

## 2021-04-05 ENCOUNTER — Ambulatory Visit (HOSPITAL_COMMUNITY)
Admission: RE | Admit: 2021-04-05 | Discharge: 2021-04-05 | Disposition: A | Discharge status: Home / Self Care | Payer: Medicaid Other | Source: Ambulatory Visit | Attending: Student | Admitting: Student

## 2021-04-05 ENCOUNTER — Ambulatory Visit: Payer: Medicaid Other

## 2021-04-05 ENCOUNTER — Ambulatory Visit (HOSPITAL_COMMUNITY)
Admission: RE | Admit: 2021-04-05 | Discharge: 2021-04-05 | Disposition: A | Discharge status: Home / Self Care | Payer: Medicaid Other | Source: Ambulatory Visit | Attending: Adult Health | Admitting: Adult Health

## 2021-04-05 DIAGNOSIS — J90 Pleural effusion, not elsewhere classified: Secondary | ICD-10-CM

## 2021-04-05 HISTORY — PX: IR THORACENTESIS ASP PLEURAL SPACE W/IMG GUIDE: IMG5380

## 2021-04-05 MED ORDER — LIDOCAINE HCL 1 % IJ SOLN
INTRAMUSCULAR | Status: AC
  Filled 2021-04-05: qty 20

## 2021-04-05 NOTE — Procedures (Signed)
PROCEDURE SUMMARY:  Successful image-guided left thoracentesis. Yielded 1.5 L of clear amber fluid. Pt tolerated procedure well. No immediate complications. EBL = trace   Specimen was sent for labs. CXR ordered.  Please see imaging section of Epic for full dictation.  Gail Gang Sun Wilensky PA-C 04/05/2021 2:15 PM

## 2021-04-06 LAB — CYTOLOGY - NON PAP

## 2021-04-09 NOTE — Progress Notes (Signed)
I called and spoke with patient daughter and emergency contact and let her know about the results and she voices understanding. I let her know that we would try to get the patient in sooner that 04/20/21 and we would call to make a follow up in office on 04/16/21 and set up fluid drain per TP.  If we are not able to move the scan then make a follow up in the office. No other concerns./

## 2021-04-10 ENCOUNTER — Other Ambulatory Visit: Payer: Self-pay | Admitting: Adult Health

## 2021-04-10 ENCOUNTER — Encounter: Payer: Self-pay | Admitting: Physical Therapy

## 2021-04-10 ENCOUNTER — Other Ambulatory Visit: Payer: Self-pay

## 2021-04-10 ENCOUNTER — Ambulatory Visit: Payer: Medicaid Other | Attending: Internal Medicine | Admitting: Physical Therapy

## 2021-04-10 DIAGNOSIS — Z7409 Other reduced mobility: Secondary | ICD-10-CM | POA: Diagnosis not present

## 2021-04-10 DIAGNOSIS — M75101 Unspecified rotator cuff tear or rupture of right shoulder, not specified as traumatic: Secondary | ICD-10-CM | POA: Insufficient documentation

## 2021-04-10 DIAGNOSIS — M6281 Muscle weakness (generalized): Secondary | ICD-10-CM

## 2021-04-10 DIAGNOSIS — M25511 Pain in right shoulder: Secondary | ICD-10-CM | POA: Diagnosis present

## 2021-04-10 DIAGNOSIS — G8929 Other chronic pain: Secondary | ICD-10-CM | POA: Insufficient documentation

## 2021-04-10 DIAGNOSIS — M25552 Pain in left hip: Secondary | ICD-10-CM | POA: Insufficient documentation

## 2021-04-10 DIAGNOSIS — J90 Pleural effusion, not elsewhere classified: Secondary | ICD-10-CM

## 2021-04-10 NOTE — Progress Notes (Signed)
I called the patient to let them know the date of the scan is changed to 04/13/2021 at 11:30 am and check in is 11:00 am. Patient is agreeable to be seen tomorrow morning since this is your half day. They prefer around 10 am and want to make sure this is an ok time to come in and see you. Please advise.

## 2021-04-10 NOTE — Progress Notes (Signed)
Patient has a follow up in office tomorrow with TP.

## 2021-04-10 NOTE — Therapy (Signed)
[place], [place], [address] Phone: [phone]   Fax:  [phone]  Physical Therapy Treatment  Patient Details  Name: Gail Fitzgerald MRN: [id number] Date of Birth: October 05, 1934 Referring Provider (PT): Dr Domenic Polite   Encounter Date: 04/10/2021   PT End of Session - 04/10/21 1508     Visit Number 2    Number of Visits 8    Date for PT Re-Evaluation 05/07/21    Authorization Type [place] [employer]    PT Start Time 1105    PT Stop Time 1143    PT Time Calculation (min) 38 min    Activity Tolerance Patient tolerated treatment well    Behavior During Therapy [facility] for tasks assessed/performed             Past Medical History:  Diagnosis Date   Hypertension     Past Surgical History:  Procedure Laterality Date   IR THORACENTESIS ASP PLEURAL SPACE W/IMG GUIDE  04/05/2021    There were no vitals filed for this visit.   Subjective Assessment - 04/10/21 1106     Subjective She had another procedure to drain more fluid from her lungs.  She is having another scan soon.  MD said it was OK to do PT.  Did not sleep well ast night.  Having some back pain. She can take a deeper breath since the last procedure.  Still tires quickly and is out od breath.    Pain Score 5    currently no pain   Pain Location Back    Pain Orientation Mid;Lower;Upper    Pain Descriptors / Indicators Tightness;Aching;Sore    Pain Type Chronic pain    Pain Onset More than a month ago    Pain Frequency Intermittent                 [facility] Adult PT Treatment/Exercise - 04/10/21 0001       Lumbar Exercises: Stretches   Lower Trunk Rotation 10 seconds    Lower Trunk Rotation Limitations x5 each side arms in goal post    Other Lumbar Stretch Exercise sidelying chest opener, upper trunk rotation      Lumbar Exercises: Seated   Sit to Stand 10 reps      Shoulder Exercises: Supine   Horizontal ABduction  Strengthening;Both;10 reps    Theraband Level (Shoulder Horizontal ABduction) Level 2 (Red)    Shoulder Flexion Weight (lbs) red looped band overhead lift x 10 inhale      Shoulder Exercises: Standing   Shoulder ABduction Weight (lbs) on wall with ball x 10 goal post (P)     Extension Both;15 reps    Theraband Level (Shoulder Extension) Level 3 (Green)    Row Both;15 reps    Theraband Level (Shoulder Row) Level 3 (Green)      Shoulder Exercises: ROM/Strengthening   UBE (Upper Arm Bike) 5 min L1 in reverse   SaO2 100%     Shoulder Exercises: Stretch   Corner Stretch 3 reps;30 seconds (P)                          PT Long Term Goals - 04/10/21 1508       PT LONG TERM GOAL #1   Title Pt will be Ind in a final HEP for stretching and strengthening    Status On-going      PT LONG TERM GOAL #2   Title Pt  will report household and community ambulation, endurance improved for that of pre-hopsitalization level    Status On-going      PT LONG TERM GOAL #3   Title Pt will report completion of household actiities and/or gardening with pain level not exceeding 3/10 in back, min fatigue.    Status On-going      PT LONG TERM GOAL #4   Title Pt will be able to increase 2 min walk test distance to 240 feet or more    Status On-going                   Plan - 04/10/21 1113     Clinical Impression Statement Patient is doing well despite another procedure to remove fluid from lungs.  Vitals were stable today and she was able to exercise in standing for several minutes at a time with min increase in fatigue.  She was educated on breathing and importance of full inhale/exhale to move ribs and reduce postural tightness.    PT Treatment/Interventions ADLs/Self Care Home Management;Moist Heat;Gait training;Functional mobility training;Balance training;Therapeutic exercise;Therapeutic activities;Manual techniques;Passive range of motion;Cryotherapy    PT Next Visit Plan check  stretches/HEP.  Wall for posture an strength, UBE reverse    PT Home Exercise Plan [id number]    Consulted and Agree with Plan of Care Patient;Family member/caregiver    Family Member Consulted DIL             Patient will benefit from skilled therapeutic intervention in order to improve the following deficits and impairments:  Decreased endurance, Decreased mobility, Difficulty walking, Cardiopulmonary status limiting activity, Decreased activity tolerance, Decreased strength, Postural dysfunction, Pain, Impaired flexibility, Impaired UE functional use, Decreased scar mobility, Decreased range of motion  Visit Diagnosis: Impaired mobility and endurance  Muscle weakness (generalized)     Problem List Patient Active Problem List   Diagnosis Date Noted   Pleural effusion on left 03/10/2021    Gail Fitzgerald 04/10/2021, 3:09 PM  [facility] [address] [place], [place], [address] Phone: [phone]   Fax:  [phone]  Name: Gail Fitzgerald MRN: [id number] Date of Birth: 01-02-1935  Check all possible CPT codes: 97110- Therapeutic Exercise, [phone]- Neuro Re-education, [place], [address] - Therapeutic Activities, [phone] - [place], and [phone] - Physical performance training         Raeford Razor, PT 04/10/21 3:10 PM Phone: [phone] Fax: [phone]

## 2021-04-10 NOTE — Progress Notes (Signed)
This is a follow up CXR before PET scan on 04/13/21.

## 2021-04-11 ENCOUNTER — Ambulatory Visit (INDEPENDENT_AMBULATORY_CARE_PROVIDER_SITE_OTHER): Payer: Medicaid Other

## 2021-04-11 ENCOUNTER — Ambulatory Visit: Payer: Medicaid Other | Admitting: Adult Health

## 2021-04-11 ENCOUNTER — Encounter: Payer: Self-pay | Admitting: Adult Health

## 2021-04-11 DIAGNOSIS — J90 Pleural effusion, not elsewhere classified: Secondary | ICD-10-CM | POA: Diagnosis not present

## 2021-04-11 NOTE — Progress Notes (Signed)
@Patient  ID: Gail Fitzgerald, female    DOB: 1935-07-19, 86 y.o.   MRN: [id number]  Chief Complaint  Patient presents with   Follow-up    Referring provider: No ref. provider found  HPI: 86 year old female seen for pulmonary consult March 11, 2021 for large left pleural effusion during hospitalization Patient is [place] and does not speak English  TEST/EVENTS :  Interventional radiology thoracentesis March 11, 2021 with 700 cc of dark fluid removed.  Fluid analysis showed glucose 108, protein 5.4, LDH 164, WBC 20-40 with 50% lymphs and 43% neutrophils and cytology with reactive mesothelial cells. CT chest March 11, 2021 showed apical scarring, 1.6 cm scarring in the right apex, 10 x 5 mm left upper pleural-based nodule, residual volume in the left pleural effusion and left base consolidation  04/11/2021 Follow up ; Left Pleural Effusion (patient is accompanied by daughter-in-law and interpreter today for visit) Patient returns for 1 week follow-up.  Patient was seen for pulmonary consult March 11, 2021 during hospitalization.  She was found to have a large left pleural effusion.  She underwent thoracentesis via interventional radiology.  700 cc of dark fluid was removed.  Fluid analysis showed an exudative left pleural effusion.  She was treated with a course of antibiotics.  QuantiFERON gold plus was positive, pleural fluid AFB culture was negative.  No signs of active TB on CT were noted.  Pleural fluid culture was negative, pleural fluid fungus was negative.  Pleural fluid cytology showed atypical cells favored to be reactive mesothelial cells.  CT chest done postthoracentesis showed apical scarring, 1.6 cm scarring in the lower right apex and a 10 x 5 mm left upper pleural-based nodule.  Left pleural effusion and left base consolidation last week patient was seen with complaints of increased shortness of breath.  And tightness in her back.  She continues to have low energy, shortness of breath and  decreased appetite.  Chest x-ray showed enlarging left pleural effusion. No known history of cancer.  Mammogram is up-to-date.  Has not had a colonoscopy, was not recommended due to her age.  She has a never smoker. Patient was set up for a thoracentesis and PET scan.  Thoracentesis was done on 04/05/2021 with 1.5 L of pleural fluid removed.  Cytology was negative for malignant cells , abundant for lymphocytes. follow-up chest x-ray showed decreased left pleural effusion. PET scan was unable to be scheduled until April 13, 2021.  Today in the office she says she is feeling better.  She has less shortness of breath. Chest x-ray shows residual stable large left pleural effusion. Patient denies any hemoptysis, fever.   No Known Allergies  Immunization History  Administered Date(s) Administered   Influenza-Unspecified 07/10/2020   PFIZER(Purple Top)SARS-COV-2 Vaccination 10/31/2019, 11/27/2019, 07/29/2020, 12/22/2020    Past Medical History:  Diagnosis Date   Hypertension     Tobacco History: Social History   Tobacco Use  Smoking Status Never  Smokeless Tobacco Never   Counseling given: Not Answered   Outpatient Medications Prior to Visit  Medication Sig Dispense Refill   acetaminophen (TYLENOL) 500 MG tablet Take 500-1,000 mg by mouth every 6 (six) hours as needed for moderate pain.     aspirin EC 81 MG tablet Take 81 mg by mouth every evening. Swallow whole.     benazepril (LOTENSIN) 10 MG tablet Take 10 mg by mouth 2 (two) times daily.     Magnesium 400 MG CAPS Take 400 mg by mouth See admin instructions. Takes 1  tablet (400 mg) by mouth once daily 10 days on and 10 days off.     metoprolol succinate (TOPROL-XL) 100 MG 24 hr tablet Take 100 mg by mouth in the morning and at bedtime.     NON FORMULARY Take 30 drops by mouth daily as needed (anxiety). Valocordin (Adonis extract/Lily of [facility])-  (Russian Supplement)      OVER THE COUNTER MEDICATION Take 1 tablet by mouth daily as needed (for stomach discomfort). Allochol - (Herbal Supplement) Multivitamin with : Magnesium, potassium, garlic and charcoal. (Russian Medication)     simvastatin (ZOCOR) 20 MG tablet Take 20 mg by mouth in the morning.     NON FORMULARY Take 1 tablet by mouth once as needed (chest pain). Validol - got from [place]     No facility-administered medications prior to visit.     Review of Systems:   Constitutional:   No  weight loss, night sweats,  Fevers, chills,  +fatigue, or  lassitude.  HEENT:   No headaches,  Difficulty swallowing,  Tooth/dental problems, or  Sore throat,                No sneezing, itching, ear ache, nasal congestion, post nasal drip,   CV:  No chest pain,  Orthopnea, PND, swelling in lower extremities, anasarca, dizziness, palpitations, syncope.   GI  No heartburn, indigestion, abdominal pain, nausea, vomiting, diarrhea, change in bowel habits, loss of appetite, bloody stools.   Resp: .  No chest wall deformity  Skin: no rash or lesions.  GU: no dysuria, change in color of urine, no urgency or frequency.  No flank pain, no hematuria   MS:  No joint pain or swelling.  No decreased range of motion.  No back pain.    Physical Exam  BP (!) 148/64 (BP Location: Left Arm, Cuff Size: Normal)   Pulse 65   Temp 98 F (36.7 C) (Temporal)   Ht 5\' 1"  (1.549 m)   Wt 119 lb 3.2 oz (54.1 kg)   SpO2 98%   BMI 22.52 kg/m   GEN: A/Ox3; pleasant , NAD, thin female    HEENT:  [place]/AT,  NOSE-clear, THROAT-clear, no lesions, no postnasal drip or exudate noted.   NECK:  Supple w/ fair ROM; no JVD; normal carotid impulses w/o bruits; no thyromegaly or nodules palpated; no lymphadenopathy.    RESP  decreased BS in bases L>R   no accessory muscle use, no dullness to percussion  CARD:  RRR, no m/r/g, no peripheral edema, pulses intact, no cyanosis or clubbing.  GI:   Soft & nt; nml bowel sounds; no organomegaly  or masses detected.   Musco: Warm bil, no deformities or joint swelling noted.   Neuro: alert, no focal deficits noted.    Skin: Warm, no lesions or rashes    Lab Results:  CBC    Component Value Date/Time   WBC 10.3 03/13/2021 0012   RBC 4.52 03/13/2021 0012   HGB 12.8 03/13/2021 0012   HCT 39.9 03/13/2021 0012   PLT 228 03/13/2021 0012   MCV 88.3 03/13/2021 0012   MCH 28.3 03/13/2021 0012   MCHC 32.1 03/13/2021 0012   RDW 13.3 03/13/2021 0012    BMET    Component Value Date/Time   NA 134 (L) 03/13/2021 0012   K 4.7 03/13/2021 0012   CL 98 03/13/2021 0012   CO2 28 03/13/2021 0012   GLUCOSE 111 (H) 03/13/2021 0012   BUN 27 (H) 03/13/2021  0012   CREATININE 0.97 03/13/2021 0012   CALCIUM 8.6 (L) 03/13/2021 0012   GFRNONAA 58 (L) 03/13/2021 0012    BNP No results found for: BNP  ProBNP No results found for: PROBNP  Imaging: DG Chest 1 View  Result Date: 04/05/2021 CLINICAL DATA:  Post left thoracentesis for pleural effusion EXAM: CHEST  1 VIEW COMPARISON:  04/04/2021 FINDINGS: Moderate left pleural effusion is significantly decreased. Remains adjacent atelectasis/consolidation with improvement in lung aeration. Normal heart size. No pneumothorax. IMPRESSION: Decreased left pleural effusion with improvement in left lung aeration. No pneumothorax. Electronically Signed   By: Macy Mis M.D.   On: 04/05/2021 15:01   DG Chest 2 View  Result Date: 04/11/2021 CLINICAL DATA:  Pleural effusion EXAM: CHEST - 2 VIEW COMPARISON:  04/05/2021 FINDINGS: Large left pleural effusion again noted, stable. Left lower lobe atelectasis. Right lung clear. Heart is normal size. IMPRESSION: Stable large left pleural effusion with left lower lobe atelectasis. No real change. Electronically Signed   By: Rolm Baptise M.D.   On: 04/11/2021 10:21   DG Chest 2 View  Result Date: 04/04/2021 CLINICAL DATA:  Pleural effusion EXAM: CHEST - 2 VIEW COMPARISON:  03/13/2021 FINDINGS: Enlarging  left pleural effusion, now large. No effusion or confluent opacity on the right. Cardiomegaly. Aortic atherosclerosis. No acute bony abnormality. IMPRESSION: Enlarging left pleural effusion, now large. Electronically Signed   By: Rolm Baptise M.D.   On: 04/04/2021 11:01   DG CHEST PORT 1 VIEW  Result Date: 03/13/2021 CLINICAL DATA:  Shortness of breath.  Chest pain. EXAM: PORTABLE CHEST 1 VIEW COMPARISON:  CT 03/11/2021.  Chest x-ray 03/11/2021. FINDINGS: Mediastinum and hilar structures normal. Left base infiltrate consistent pneumonia again noted. Small moderate left-sided pleural effusion again noted. No pneumothorax. Change thoracic spine. IMPRESSION: Left base infiltrate consistent pneumonia again noted. Small to moderate left-sided pleural effusion again noted. No pneumothorax. Electronically Signed   By: Marcello Moores  Register   On: 03/13/2021 09:58   IR THORACENTESIS ASP PLEURAL SPACE W/IMG GUIDE  Result Date: 04/05/2021 INDICATION: Shortness of breast due to recurrent left pleural effusion. Request for therapeutic and diagnostic thoracentesis. EXAM: ULTRASOUND GUIDED LEFT THORACENTESIS MEDICATIONS: 10 mL 1% lidocaine COMPLICATIONS: None immediate. PROCEDURE: An ultrasound guided thoracentesis was thoroughly discussed with the patient and questions answered. The benefits, risks, alternatives and complications were also discussed. The patient understands and wishes to proceed with the procedure. Written consent was obtained. Ultrasound was performed to localize and mark an adequate pocket of fluid in the left chest. The area was then prepped and draped in the normal sterile fashion. 1% Lidocaine was used for local anesthesia. Under ultrasound guidance a 6 Fr Safe-T-Centesis catheter was introduced. Thoracentesis was performed. The catheter was removed and a dressing applied. FINDINGS: A total of approximately 1.5 L of clear amber fluid was removed. Samples were sent to the laboratory as requested by the  clinical team. Post procedure CXR ordered, results pending. IMPRESSION: Successful ultrasound guided left thoracentesis yielding 1.5 L of pleural fluid. Read by: Durenda Guthrie, PA-C Electronically Signed   By: Markus Daft M.D.   On: 04/05/2021 15:01      No flowsheet data found.  No results found for: NITRICOXIDE      Assessment & Plan:   Pleural effusion on left Recurrent left large pleural effusion-she is required thoracentesis x2 with reaccumulation. Cytology has been negative for malignant cells.  Pleural fluid cultures have been negative. Case has been discussed with Dr. Valeta Harms .  Patient  will have a repeat thoracentesis tomorrow August 4. Pleural fluid cultures and cytology will be sent.  PET scan as planned on August 5. She will follow back up in the office in 1 week to discuss PET scan and culture/cytology results. Long discussion with patient and family regarding the risk and benefit of procedure.  Once we have her test results back we will decide on next step   Plan  Patient Instructions  Set up for Therapeutic and Diagnostic Thoracentesis. [facility] 04/12/21 at 3:00 pm , arrive at 2:45 - Go to [facility] hospital admissions to check in .   Go for PET scan as planned on 04/13/21.  Follow up in 1 week with Dr. Jule Economy with chest xray and As needed   Please contact office for sooner follow up if symptoms do not improve or worsen or seek emergency care        I spent   38 minutes dedicated to the care of this patient on the date of this encounter to include pre-visit review of records, face-to-face time with the patient discussing conditions above, post visit ordering of testing, clinical documentation with the electronic health record, making appropriate referrals as documented, and communicating necessary findings to members of the patients care team.   Rexene Edison, NP 04/11/2021

## 2021-04-11 NOTE — H&P (View-Only) (Signed)
@Patient  ID: Gail Fitzgerald, female    DOB: [birth year], 86 y.o.   MRN: [id number]  Chief Complaint  Patient presents with   Follow-up    Referring provider: No ref. provider found  HPI: 86 year old female seen for pulmonary consult March 11, 2021 for large left pleural effusion during hospitalization Patient is [place] and does not speak English  TEST/EVENTS :  Interventional radiology thoracentesis March 11, 2021 with 700 cc of dark fluid removed.  Fluid analysis showed glucose 108, protein 5.4, LDH 164, WBC 20-40 with 50% lymphs and 43% neutrophils and cytology with reactive mesothelial cells. CT chest March 11, 2021 showed apical scarring, 1.6 cm scarring in the right apex, 10 x 5 mm left upper pleural-based nodule, residual volume in the left pleural effusion and left base consolidation  04/11/2021 Follow up ; Left Pleural Effusion (patient is accompanied by daughter-in-law and interpreter today for visit) Patient returns for 1 week follow-up.  Patient was seen for pulmonary consult March 11, 2021 during hospitalization.  She was found to have a large left pleural effusion.  She underwent thoracentesis via interventional radiology.  700 cc of dark fluid was removed.  Fluid analysis showed an exudative left pleural effusion.  She was treated with a course of antibiotics.  QuantiFERON gold plus was positive, pleural fluid AFB culture was negative.  No signs of active TB on CT were noted.  Pleural fluid culture was negative, pleural fluid fungus was negative.  Pleural fluid cytology showed atypical cells favored to be reactive mesothelial cells.  CT chest done postthoracentesis showed apical scarring, 1.6 cm scarring in the lower right apex and a 10 x 5 mm left upper pleural-based nodule.  Left pleural effusion and left base consolidation last week patient was seen with complaints of increased shortness of breath.  And tightness in her back.  She continues to have low energy, shortness of breath and  decreased appetite.  Chest x-ray showed enlarging left pleural effusion. No known history of cancer.  Mammogram is up-to-date.  Has not had a colonoscopy, was not recommended due to her age.  She has a never smoker. Patient was set up for a thoracentesis and PET scan.  Thoracentesis was done on 04/05/2021 with 1.5 L of pleural fluid removed.  Cytology was negative for malignant cells , abundant for lymphocytes. follow-up chest x-ray showed decreased left pleural effusion. PET scan was unable to be scheduled until April 13, 2021.  Today in the office she says she is feeling better.  She has less shortness of breath. Chest x-ray shows residual stable large left pleural effusion. Patient denies any hemoptysis, fever.   No Known Allergies  Immunization History  Administered Date(s) Administered   Influenza-Unspecified 07/10/2020   PFIZER(Purple Top)SARS-COV-2 Vaccination 10/31/2019, 11/27/2019, 07/29/2020, 12/22/2020    Past Medical History:  Diagnosis Date   Hypertension     Tobacco History: Social History   Tobacco Use  Smoking Status Never  Smokeless Tobacco Never   Counseling given: Not Answered   Outpatient Medications Prior to Visit  Medication Sig Dispense Refill   acetaminophen (TYLENOL) 500 MG tablet Take 500-1,000 mg by mouth every 6 (six) hours as needed for moderate pain.     aspirin EC 81 MG tablet Take 81 mg by mouth every evening. Swallow whole.     benazepril (LOTENSIN) 10 MG tablet Take 10 mg by mouth 2 (two) times daily.     Magnesium 400 MG CAPS Take 400 mg by mouth See admin instructions. Takes 1  tablet (400 mg) by mouth once daily 10 days on and 10 days off.     metoprolol succinate (TOPROL-XL) 100 MG 24 hr tablet Take 100 mg by mouth in the morning and at bedtime.     NON FORMULARY Take 30 drops by mouth daily as needed (anxiety). Valocordin (Adonis extract/Lily of [facility])-  (Russian Supplement)      OVER THE COUNTER MEDICATION Take 1 tablet by mouth daily as needed (for stomach discomfort). Allochol - (Herbal Supplement) Multivitamin with : Magnesium, potassium, garlic and charcoal. (Russian Medication)     simvastatin (ZOCOR) 20 MG tablet Take 20 mg by mouth in the morning.     NON FORMULARY Take 1 tablet by mouth once as needed (chest pain). Validol - got from [place]     No facility-administered medications prior to visit.     Review of Systems:   Constitutional:   No  weight loss, night sweats,  Fevers, chills,  +fatigue, or  lassitude.  HEENT:   No headaches,  Difficulty swallowing,  Tooth/dental problems, or  Sore throat,                No sneezing, itching, ear ache, nasal congestion, post nasal drip,   CV:  No chest pain,  Orthopnea, PND, swelling in lower extremities, anasarca, dizziness, palpitations, syncope.   GI  No heartburn, indigestion, abdominal pain, nausea, vomiting, diarrhea, change in bowel habits, loss of appetite, bloody stools.   Resp: .  No chest wall deformity  Skin: no rash or lesions.  GU: no dysuria, change in color of urine, no urgency or frequency.  No flank pain, no hematuria   MS:  No joint pain or swelling.  No decreased range of motion.  No back pain.    Physical Exam  BP (!) 148/64 (BP Location: Left Arm, Cuff Size: Normal)   Pulse 65   Temp 98 F (36.7 C) (Temporal)   Ht 5\' 1"  (1.549 m)   Wt 119 lb 3.2 oz (54.1 kg)   SpO2 98%   BMI 22.52 kg/m   GEN: A/Ox3; pleasant , NAD, thin female    HEENT:  [place]/AT,  NOSE-clear, THROAT-clear, no lesions, no postnasal drip or exudate noted.   NECK:  Supple w/ fair ROM; no JVD; normal carotid impulses w/o bruits; no thyromegaly or nodules palpated; no lymphadenopathy.    RESP  decreased BS in bases L>R   no accessory muscle use, no dullness to percussion  CARD:  RRR, no m/r/g, no peripheral edema, pulses intact, no cyanosis or clubbing.  GI:   Soft & nt; nml bowel sounds; no organomegaly  or masses detected.   Musco: Warm bil, no deformities or joint swelling noted.   Neuro: alert, no focal deficits noted.    Skin: Warm, no lesions or rashes    Lab Results:  CBC    Component Value Date/Time   WBC 10.3 03/13/2021 0012   RBC 4.52 03/13/2021 0012   HGB 12.8 03/13/2021 0012   HCT 39.9 03/13/2021 0012   PLT 228 03/13/2021 0012   MCV 88.3 03/13/2021 0012   MCH 28.3 03/13/2021 0012   MCHC 32.1 03/13/2021 0012   RDW 13.3 03/13/2021 0012    BMET    Component Value Date/Time   NA 134 (L) 03/13/2021 0012   K 4.7 03/13/2021 0012   CL 98 03/13/2021 0012   CO2 28 03/13/2021 0012   GLUCOSE 111 (H) 03/13/2021 0012   BUN 27 (H) 03/13/2021  0012   CREATININE 0.97 03/13/2021 0012   CALCIUM 8.6 (L) 03/13/2021 0012   GFRNONAA 58 (L) 03/13/2021 0012    BNP No results found for: BNP  ProBNP No results found for: PROBNP  Imaging: DG Chest 1 View  Result Date: 04/05/2021 CLINICAL DATA:  Post left thoracentesis for pleural effusion EXAM: CHEST  1 VIEW COMPARISON:  04/04/2021 FINDINGS: Moderate left pleural effusion is significantly decreased. Remains adjacent atelectasis/consolidation with improvement in lung aeration. Normal heart size. No pneumothorax. IMPRESSION: Decreased left pleural effusion with improvement in left lung aeration. No pneumothorax. Electronically Signed   By: Macy Mis M.D.   On: 04/05/2021 15:01   DG Chest 2 View  Result Date: 04/11/2021 CLINICAL DATA:  Pleural effusion EXAM: CHEST - 2 VIEW COMPARISON:  04/05/2021 FINDINGS: Large left pleural effusion again noted, stable. Left lower lobe atelectasis. Right lung clear. Heart is normal size. IMPRESSION: Stable large left pleural effusion with left lower lobe atelectasis. No real change. Electronically Signed   By: Rolm Baptise M.D.   On: 04/11/2021 10:21   DG Chest 2 View  Result Date: 04/04/2021 CLINICAL DATA:  Pleural effusion EXAM: CHEST - 2 VIEW COMPARISON:  03/13/2021 FINDINGS: Enlarging  left pleural effusion, now large. No effusion or confluent opacity on the right. Cardiomegaly. Aortic atherosclerosis. No acute bony abnormality. IMPRESSION: Enlarging left pleural effusion, now large. Electronically Signed   By: Rolm Baptise M.D.   On: 04/04/2021 11:01   DG CHEST PORT 1 VIEW  Result Date: 03/13/2021 CLINICAL DATA:  Shortness of breath.  Chest pain. EXAM: PORTABLE CHEST 1 VIEW COMPARISON:  CT 03/11/2021.  Chest x-ray 03/11/2021. FINDINGS: Mediastinum and hilar structures normal. Left base infiltrate consistent pneumonia again noted. Small moderate left-sided pleural effusion again noted. No pneumothorax. Change thoracic spine. IMPRESSION: Left base infiltrate consistent pneumonia again noted. Small to moderate left-sided pleural effusion again noted. No pneumothorax. Electronically Signed   By: Marcello Moores  Register   On: 03/13/2021 09:58   IR THORACENTESIS ASP PLEURAL SPACE W/IMG GUIDE  Result Date: 04/05/2021 INDICATION: Shortness of breast due to recurrent left pleural effusion. Request for therapeutic and diagnostic thoracentesis. EXAM: ULTRASOUND GUIDED LEFT THORACENTESIS MEDICATIONS: 10 mL 1% lidocaine COMPLICATIONS: None immediate. PROCEDURE: An ultrasound guided thoracentesis was thoroughly discussed with the patient and questions answered. The benefits, risks, alternatives and complications were also discussed. The patient understands and wishes to proceed with the procedure. Written consent was obtained. Ultrasound was performed to localize and mark an adequate pocket of fluid in the left chest. The area was then prepped and draped in the normal sterile fashion. 1% Lidocaine was used for local anesthesia. Under ultrasound guidance a 6 Fr Safe-T-Centesis catheter was introduced. Thoracentesis was performed. The catheter was removed and a dressing applied. FINDINGS: A total of approximately 1.5 L of clear amber fluid was removed. Samples were sent to the laboratory as requested by the  clinical team. Post procedure CXR ordered, results pending. IMPRESSION: Successful ultrasound guided left thoracentesis yielding 1.5 L of pleural fluid. Read by: Durenda Guthrie, PA-C Electronically Signed   By: Markus Daft M.D.   On: 04/05/2021 15:01      No flowsheet data found.  No results found for: NITRICOXIDE      Assessment & Plan:   Pleural effusion on left Recurrent left large pleural effusion-she is required thoracentesis x2 with reaccumulation. Cytology has been negative for malignant cells.  Pleural fluid cultures have been negative. Case has been discussed with Dr. Valeta Harms .  Patient  will have a repeat thoracentesis tomorrow August 4. Pleural fluid cultures and cytology will be sent.  PET scan as planned on August 5. She will follow back up in the office in 1 week to discuss PET scan and culture/cytology results. Long discussion with patient and family regarding the risk and benefit of procedure.  Once we have her test results back we will decide on next step   Plan  Patient Instructions  Set up for Therapeutic and Diagnostic Thoracentesis. [facility] 04/12/21 at 3:00 pm , arrive at 2:45 - Go to [facility] hospital admissions to check in .   Go for PET scan as planned on 04/13/21.  Follow up in 1 week with Dr. Jule Economy with chest xray and As needed   Please contact office for sooner follow up if symptoms do not improve or worsen or seek emergency care        I spent   38 minutes dedicated to the care of this patient on the date of this encounter to include pre-visit review of records, face-to-face time with the patient discussing conditions above, post visit ordering of testing, clinical documentation with the electronic health record, making appropriate referrals as documented, and communicating necessary findings to members of the patients care team.   Rexene Edison, NP 04/11/2021

## 2021-04-11 NOTE — Patient Instructions (Addendum)
Set up for Therapeutic and Diagnostic Thoracentesis. [facility] 04/12/21 at 3:00 pm , arrive at 2:45 - Go to [facility] hospital admissions to check in .   Go for PET scan as planned on 04/13/21.  Follow up in 1 week with Dr. Jule Economy with chest xray and As needed   Please contact office for sooner follow up if symptoms do not improve or worsen or seek emergency care

## 2021-04-11 NOTE — Assessment & Plan Note (Signed)
Recurrent left large pleural effusion-she is required thoracentesis x2 with reaccumulation. Cytology has been negative for malignant cells.  Pleural fluid cultures have been negative. Case has been discussed with Dr. Valeta Harms .  Patient will have a repeat thoracentesis tomorrow August 4. Pleural fluid cultures and cytology will be sent.  PET scan as planned on August 5. She will follow back up in the office in 1 week to discuss PET scan and culture/cytology results. Long discussion with patient and family regarding the risk and benefit of procedure.  Once we have her test results back we will decide on next step   Plan  Patient Instructions  Set up for Therapeutic and Diagnostic Thoracentesis. [facility] 04/12/21 at 3:00 pm , arrive at 2:45 - Go to [facility] hospital admissions to check in .   Go for PET scan as planned on 04/13/21.  Follow up in 1 week with Dr. Jule Economy with chest xray and As needed   Please contact office for sooner follow up if symptoms do not improve or worsen or seek emergency care

## 2021-04-12 ENCOUNTER — Ambulatory Visit (HOSPITAL_COMMUNITY): Payer: Medicaid Other

## 2021-04-12 ENCOUNTER — Ambulatory Visit (HOSPITAL_COMMUNITY)
Admission: RE | Admit: 2021-04-12 | Discharge: 2021-04-12 | Disposition: A | Discharge status: Home / Self Care | Payer: Medicaid Other | Attending: Pulmonary Disease | Admitting: Pulmonary Disease

## 2021-04-12 ENCOUNTER — Encounter (HOSPITAL_COMMUNITY): Payer: Self-pay | Admitting: Pulmonary Disease

## 2021-04-12 ENCOUNTER — Encounter (HOSPITAL_COMMUNITY)
Admission: RE | Disposition: A | Discharge status: Home / Self Care | Payer: Self-pay | Source: Home / Self Care | Attending: Pulmonary Disease

## 2021-04-12 ENCOUNTER — Ambulatory Visit: Payer: Medicaid Other

## 2021-04-12 DIAGNOSIS — J90 Pleural effusion, not elsewhere classified: Secondary | ICD-10-CM | POA: Insufficient documentation

## 2021-04-12 DIAGNOSIS — Z9889 Other specified postprocedural states: Secondary | ICD-10-CM

## 2021-04-12 DIAGNOSIS — Z79899 Other long term (current) drug therapy: Secondary | ICD-10-CM | POA: Insufficient documentation

## 2021-04-12 HISTORY — PX: THORACENTESIS: SHX235

## 2021-04-12 LAB — GLUCOSE, PLEURAL OR PERITONEAL FLUID: Glucose, Fluid: 85 mg/dL

## 2021-04-12 LAB — BODY FLUID CELL COUNT WITH DIFFERENTIAL
Eos, Fluid: 0 %
Lymphs, Fluid: 89 %
Monocyte-Macrophage-Serous Fluid: 2 % — ABNORMAL LOW (ref 50–90)
Neutrophil Count, Fluid: 9 % (ref 0–25)
Total Nucleated Cell Count, Fluid: 2896 cu mm — ABNORMAL HIGH (ref 0–1000)

## 2021-04-12 LAB — ALBUMIN, PLEURAL OR PERITONEAL FLUID: Albumin, Fluid: 2.4 g/dL

## 2021-04-12 LAB — LACTATE DEHYDROGENASE, PLEURAL OR PERITONEAL FLUID: LD, Fluid: 97 U/L — ABNORMAL HIGH (ref 3–23)

## 2021-04-12 LAB — PROTEIN, PLEURAL OR PERITONEAL FLUID: Total protein, fluid: 4.2 g/dL

## 2021-04-12 SURGERY — THORACENTESIS
Anesthesia: LOCAL

## 2021-04-12 NOTE — Interval H&P Note (Signed)
History and Physical Interval Note:  04/12/2021 5:03 PM  Gail Fitzgerald  has presented today for surgery, with the diagnosis of pleural effusion.  The various methods of treatment have been discussed with the patient and family. After consideration of risks, benefits and other options for treatment, the patient has consented to  Procedure(s): THORACENTESIS (N/A) as a surgical intervention.  The patient's history has been reviewed, patient examined, no change in status, stable for surgery.  I have reviewed the patient's chart and labs.  Questions were answered to the patient's satisfaction.     [place]

## 2021-04-12 NOTE — Op Note (Signed)
Thoracentesis  Procedure Note  Gail Fitzgerald  [id number]  04/12/1935  Date:04/12/21  Time:5:35 PM   Provider Performing:Jalecia Leon L Javari Bufkin   Procedure: Thoracentesis with imaging guidance (62836)  Indication(s) Pleural Effusion  Consent Risks of the procedure as well as the alternatives and risks of each were explained to the patient and/or caregiver.  Consent for the procedure was obtained and is signed in the bedside chart  Anesthesia Topical only with 1% lidocaine    Time Out Verified patient identification, verified procedure, site/side was marked, verified correct patient position, special equipment/implants available, medications/allergies/relevant history reviewed, required imaging and test results available.   Sterile Technique Maximal sterile technique including full sterile barrier drape, hand hygiene, sterile gown, sterile gloves, mask, hair covering, sterile ultrasound probe cover (if used).  Procedure Description Ultrasound was used to identify appropriate pleural anatomy for placement and overlying skin marked.  Area of drainage cleaned and draped in sterile fashion. Lidocaine was used to anesthetize the skin and subcutaneous tissue.  1500 cc's of amber fluid appearing fluid was drained from the left pleural space. Catheter then removed and bandaid applied to site.   Complications/Tolerance None; patient tolerated the procedure well. Chest X-ray is ordered to confirm no post-procedural complication.   EBL Minimal   Specimen(s) Pleural fluid  [name], DO [facility] 04/12/2021 5:35 PM     Left sided pleural effusion:

## 2021-04-13 ENCOUNTER — Other Ambulatory Visit: Payer: Self-pay

## 2021-04-13 ENCOUNTER — Encounter (HOSPITAL_COMMUNITY)
Admission: RE | Admit: 2021-04-13 | Discharge: 2021-04-13 | Disposition: A | Discharge status: Home / Self Care | Payer: Medicaid Other | Source: Ambulatory Visit | Attending: Adult Health | Admitting: Adult Health

## 2021-04-13 ENCOUNTER — Encounter (HOSPITAL_COMMUNITY): Payer: Self-pay | Admitting: Pulmonary Disease

## 2021-04-13 DIAGNOSIS — J90 Pleural effusion, not elsewhere classified: Secondary | ICD-10-CM

## 2021-04-13 LAB — AMYLASE, PLEURAL OR PERITONEAL FLUID: Amylase, Fluid: 42 U/L

## 2021-04-13 LAB — GLUCOSE, CAPILLARY: Glucose-Capillary: 102 mg/dL — ABNORMAL HIGH (ref 70–99)

## 2021-04-13 LAB — CYTOLOGY - NON PAP

## 2021-04-13 MED ORDER — FLUDEOXYGLUCOSE F - 18 (FDG) INJECTION
5.9500 | Freq: Once | INTRAVENOUS | Status: AC
  Administered 2021-04-13: 5.95 via INTRAVENOUS

## 2021-04-14 LAB — FUNGUS CULTURE RESULT

## 2021-04-14 LAB — FUNGUS CULTURE WITH STAIN

## 2021-04-14 LAB — FUNGAL ORGANISM REFLEX

## 2021-04-15 LAB — PH, BODY FLUID: pH, Body Fluid: 7.9

## 2021-04-15 LAB — TRIGLYCERIDES, BODY FLUIDS: Triglycerides, Fluid: 47 mg/dL

## 2021-04-16 ENCOUNTER — Telehealth: Payer: Self-pay | Admitting: Adult Health

## 2021-04-16 DIAGNOSIS — J948 Other specified pleural conditions: Secondary | ICD-10-CM

## 2021-04-16 LAB — BODY FLUID CULTURE W GRAM STAIN
Culture: NO GROWTH
Gram Stain: NONE SEEN

## 2021-04-16 NOTE — Telephone Encounter (Signed)
Discussed PET results with family member Rayford Halsted -Daughter in [employer]- on [place], speaks english)  Case discussed with Dr. Valeta Harms, IR , patient will need biopsy for tissue dx   Please refer to IR for CT guided biopsy for pleural mass.  Will need interpretor.  Please let me know when this is set up , she has ov on Wednesday may need to reschedule.

## 2021-04-16 NOTE — Telephone Encounter (Signed)
Order was placed for CT guided biopsy with note that pt will need a [place] interrupter. PCC's please let Estill Bamberg or Tammy know when appointment has been made r/t possibility of rescheduling upcoming OV.

## 2021-04-16 NOTE — Telephone Encounter (Signed)
I have printed out the order and will let you know when I see that it has been scheduled.

## 2021-04-16 NOTE — Telephone Encounter (Signed)
CT order location edited

## 2021-04-16 NOTE — Telephone Encounter (Signed)
These are put in review and scheduled by Central Scheduling once IR lets them know when it can be done.  I can keep an eye out and let you know when I see that it has been scheduled but order must have as location Elvina Sidle or Zacarias Pontes so it will hit their workque for review.  This order states [place] [facility] as location so that needs to be changed.

## 2021-04-17 ENCOUNTER — Other Ambulatory Visit: Payer: Self-pay

## 2021-04-17 ENCOUNTER — Encounter (HOSPITAL_COMMUNITY): Payer: Self-pay | Admitting: Radiology

## 2021-04-17 ENCOUNTER — Ambulatory Visit: Payer: Medicaid Other

## 2021-04-17 DIAGNOSIS — M6281 Muscle weakness (generalized): Secondary | ICD-10-CM

## 2021-04-17 DIAGNOSIS — Z7409 Other reduced mobility: Secondary | ICD-10-CM

## 2021-04-17 DIAGNOSIS — M75101 Unspecified rotator cuff tear or rupture of right shoulder, not specified as traumatic: Secondary | ICD-10-CM

## 2021-04-17 DIAGNOSIS — G8929 Other chronic pain: Secondary | ICD-10-CM

## 2021-04-17 DIAGNOSIS — M25552 Pain in left hip: Secondary | ICD-10-CM

## 2021-04-17 NOTE — Progress Notes (Signed)
Synopsis: Referred for recurrent pleural effusion by No ref. provider found  Subjective:   PATIENT ID: Gail Fitzgerald GENDER: female DOB: August 28, 1935, MRN: [id number]  Chief Complaint  Patient presents with   Consult    Consult for Pleural Effusion. Reports with activity she is till having SOB but it has significantly improved.       86yF with history of recurrent exudative left pleural effusion with lymphocyte predominant differential, FDG avid pleural based nodules and 5cm exophytic solid masas. She is accompanied today by an interpreter and her daughter-in-law. Quant gold positive in July, first thora at that time negative for AFB. Has had 3 thoracenteses with negative cytology, last 8/4 with 1.5L removed. Feels less dyspneic after thoras - although she swears. The last thoracentesis she did feel some chest tightness toward the end of it which only slowly improved thereafter suggestive of some entrapped lung.     Cholesterol and fungal cultures pending from last thora 8/4.  Otherwise pertinent review of systems is negative.  She recalls no significant asbestos exposures, TB exposures, chest wall radiation. No family history of lung cancer, GU cancer.  Past Medical History:  Diagnosis Date   Hypertension      Family History  Problem Relation Age of Onset   Diabetes Neg Hx    Kidney disease Neg Hx      Past Surgical History:  Procedure Laterality Date   IR THORACENTESIS ASP PLEURAL SPACE W/IMG GUIDE  04/05/2021   THORACENTESIS N/A 04/12/2021   Procedure: Mathews Robinsons;  Surgeon: [name], DO;  Location: [facility];  Service: Pulmonary;  Laterality: N/A;    Social History   Socioeconomic History   Marital status: Widowed    Spouse name: Not on file   Number of children: Not on file   Years of education: Not on file   Highest education level: Not on file  Occupational History   Not on file  Tobacco Use   Smoking status: Never   Smokeless tobacco: Never   Substance and Sexual Activity   Alcohol use: Not Currently   Drug use: Not Currently   Sexual activity: Not Currently  Other Topics Concern   Not on file  Social History Narrative   Not on file   Social Determinants of Health   Financial Resource Strain: Not on file  Food Insecurity: Not on file  Transportation Needs: Not on file  Physical Activity: Not on file  Stress: Not on file  Social Connections: Not on file  Intimate Partner Violence: Not on file     No Known Allergies   Outpatient Medications Prior to Visit  Medication Sig Dispense Refill   acetaminophen (TYLENOL) 500 MG tablet Take 500-1,000 mg by mouth every 6 (six) hours as needed for moderate pain.     aspirin EC 81 MG tablet Take 81 mg by mouth every evening. Swallow whole.     benazepril (LOTENSIN) 10 MG tablet Take 10 mg by mouth 2 (two) times daily.     ibuprofen (ADVIL) 200 MG tablet Take 400 mg by mouth every 8 (eight) hours as needed (pain.).     Magnesium 400 MG CAPS Take 400 mg by mouth See admin instructions. Takes 1 tablet (400 mg) by mouth once daily 10 days on and 10 days off.     metoprolol succinate (TOPROL-XL) 100 MG 24 hr tablet Take 100 mg by mouth in the morning and at bedtime.     NON FORMULARY Take 30 drops by mouth daily  as needed (anxiety). Valocordin (Adonis extract/Lily of [facility])-  (Russian Supplement)     OVER THE COUNTER MEDICATION Take 1 tablet by mouth daily as needed (for stomach discomfort). Allochol - (Herbal Supplement) Multivitamin with : Magnesium, potassium, garlic and charcoal. (Russian Medication)     simvastatin (ZOCOR) 20 MG tablet Take 20 mg by mouth in the morning.     UNABLE TO FIND Take 50 mg by mouth daily as needed (chest pain (angina)). Med Name: Validol (Menthyl isovalerate and menthol) (Russian Medication)     No facility-administered medications prior to visit.       Objective:   Physical  Exam:  General appearance: 86 y.o., female, NAD, conversant  Eyes: anicteric sclerae, moist conjunctivae; no lid-lag; PERRL, tracking appropriately HENT: NCAT; oropharynx, MMM, no mucosal ulcerations; normal hard and soft palate Neck: Trachea midline; no lymphadenopathy, no JVD Lungs: Diminished over left lung fields, no crackles, no wheeze, with normal respiratory effort CV: RRR, no MRGs  Abdomen: Soft, non-tender; non-distended, BS present  Extremities: No peripheral edema, radial and DP pulses present bilaterally  Skin: Normal temperature, turgor and texture; no rash Psych: Appropriate affect Neuro: Alert and oriented to person and place, no focal deficit    Vitals:   04/18/21 1006  BP: 118/78  Pulse: 67  SpO2: 98%  Weight: 115 lb 6.4 oz (52.3 kg)  Height: 5\' 1"  (1.549 m)   98% on RA BMI Readings from Last 3 Encounters:  04/18/21 21.80 kg/m  04/12/21 22.54 kg/m  04/11/21 22.52 kg/m   Wt Readings from Last 3 Encounters:  04/18/21 115 lb 6.4 oz (52.3 kg)  04/12/21 119 lb 4.3 oz (54.1 kg)  04/11/21 119 lb 3.2 oz (54.1 kg)     CBC    Component Value Date/Time   WBC 10.3 03/13/2021 0012   RBC 4.52 03/13/2021 0012   HGB 12.8 03/13/2021 0012   HCT 39.9 03/13/2021 0012   PLT 228 03/13/2021 0012   MCV 88.3 03/13/2021 0012   MCH 28.3 03/13/2021 0012   MCHC 32.1 03/13/2021 0012   RDW 13.3 03/13/2021 0012      Chest Imaging: NM Pet/CT 8/5 personally reviewed and remarkable for FDG avid pleural nodules/rind, some uptake in mediastinal nodes (some of which are extensively calcified), left FDG avid 5cm exophytic renal mass  Pulmonary Functions Testing Results: No flowsheet data found.  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 Today she reports she has been only minimally symptomatic at time of last  couple of thoracenteses. Negative cytology x3.  # FDG avid pleural based nodules # 5cm exophytic solid mass    Plan:  - Order for CT guided pleural nodule biopsy placed targeting left posterobasal mass already placed, scheduled 04/24/21 - Will discuss with IR whether it would improve visualization of pleural mass for biopsy to first perform thoracentesis if there is reaccumulation of pleural fluid. If needed, then we will schedule thoracentesis on 04/23/21 - Discussed options of proceeding with tunneled pleural catheter if she develops worsening dyspnea and reaccumulation of effusion vs periodic thoracentesis. I think clarification of underlying disease process with biopsy above and treatment prognosis would be important prior to committing to PleurX placement, especially given her relatively limited referable symptoms.    I spent 62 minutes dedicated to the care of this patient on the  date of this encounter to include pre-visit review of records, face-to-face time with the patient and [place] language interpreter discussing conditions above, post visit ordering of testing, clinical documentation with the electronic health record, making appropriate referrals as documented, and communicating necessary findings to members of the patients care team.      Maryjane Hurter, MD [facility] 04/18/2021 12:10 PM

## 2021-04-17 NOTE — Telephone Encounter (Signed)
Called and spoke with Gail Fitzgerald. She verbalized understanding. She stated that she wishes to keep the appt tomorrow because the family has not yet told the patient about the possibility of lung cancer. She thinks this conversation would be best had during her OV in case she has questions that the family can not answer. She also stated that she would like for Dr. Verlee Monte to explain the process of the biopsy.   I advised her that we can keep the appt for tomorrow. She verbalized understanding.   Nothing further needed at time of call.

## 2021-04-17 NOTE — Telephone Encounter (Signed)
Pt has been scheduled for CT Biopsy on 8/16.  Was told to make nurse aware due to pt has ov appt 8/10 and pt may need to reschedule.

## 2021-04-17 NOTE — Progress Notes (Signed)
Patient Name  Gail Fitzgerald, Gail Fitzgerald Legal Sex  Female DOB  08/26/1935 SSN  [id number] Address  [address] [address] Phone  [phone] [facility])  [phone] (Mobile)     RE: CT Biopsy Received: Today Arne Cleveland, MD  Jilly[place]   CT core L pleural PET+ mass   DDH  ]         Previous Messages    ----- Message -----  From: Garth Bigness D  Sent: 04/17/2021  10:02 AM EDT  To: Ir Procedure Requests  Subject: CT Biopsy                                       Procedure:  CT BIOPSY   Reason:  Pleural Mass   History:  NM PET in computer   Provider:  Melvenia Needles   Provider Contact:  [phone]

## 2021-04-17 NOTE — Therapy (Signed)
[place], [place], [address] Phone: [phone]   Fax:  [phone]  Physical Therapy Treatment  Patient Details  Name: Gail Fitzgerald MRN: [id number] Date of Birth: Jul 19, 1935 Referring Provider (PT): Dr Domenic Polite   Encounter Date: 04/17/2021   PT End of Session - 04/17/21 1544     Visit Number 3    Number of Visits 8    Date for PT Re-Evaluation 05/07/21    Authorization Type [place] [employer]    PT Start Time 1103    PT Stop Time 1145    PT Time Calculation (min) 42 min    Activity Tolerance Patient tolerated treatment well    Behavior During Therapy [facility] for tasks assessed/performed             Past Medical History:  Diagnosis Date   Hypertension     Past Surgical History:  Procedure Laterality Date   IR THORACENTESIS ASP PLEURAL SPACE W/IMG GUIDE  04/05/2021   THORACENTESIS N/A 04/12/2021   Procedure: Mathews Robinsons;  Surgeon: [name], DO;  Location: [facility];  Service: Pulmonary;  Laterality: N/A;    There were no vitals filed for this visit.   Subjective Assessment - 04/17/21 1108     Subjective Breathing more easily after the thorocentesis on 04/12/21. Waiting on the reslts of the PET scan.    Patient is accompained by: Family member    Currently in Pain? No/denies                               [facility] Adult PT Treatment/Exercise - 04/17/21 0001       Exercises   Exercises Lumbar;Knee/Hip      Lumbar Exercises: Aerobic   Nustep 5 mins, L3; UEs/LEs   94%; HR 86     Lumbar Exercises: Supine   Pelvic Tilt 10 reps   3 sec     Knee/Hip Exercises: Supine   Bridges Both;2 sets;10 reps    Other Supine Knee/Hip Exercises Hip abd c green Tband, 15x2                    PT Education - 04/17/21 1543     Education Details Walking program for 2 to 3x per week as tolerated and using pursed lip breathing as indicated. HEP updated     Person(s) Educated Patient    Methods Explanation;Demonstration;Tactile cues;Verbal cues;Handout    Comprehension Verbalized understanding;Returned demonstration;Verbal cues required;Tactile cues required                 PT Long Term Goals - 04/10/21 1508       PT LONG TERM GOAL #1   Title Pt will be Ind in a final HEP for stretching and strengthening    Status On-going      PT LONG TERM GOAL #2   Title Pt will report household and community ambulation, endurance improved for that of pre-hopsitalization level    Status On-going      PT LONG TERM GOAL #3   Title Pt will report completion of household actiities and/or gardening with pain level not exceeding 3/10 in back, min fatigue.    Status On-going      PT LONG TERM GOAL #4   Title Pt will be able to increase 2 min walk test distance to 240 feet or more    Status On-going  Plan - 04/17/21 1546     Clinical Impression Statement PT was completed for improving activity tolerance and for lumbopelvic and LE strengthening. Pt tolerated the NuStep well with a mnimal decrease in O2 sat and increase in heart rate. Instructed in initial walking program to improve activity tolerance. Pt participated in PT today without adverse effects.    Personal Factors and Comorbidities Age;Comorbidity 2    Comorbidities hospitalization, HTN    Examination-Activity Limitations Bathing;Squat;Stairs;Lift;Locomotion Level;Bend;Stand    Examination-Participation Restrictions Cleaning;Community Activity;Yard Work;Shop;Laundry    Stability/Clinical Decision Making Stable/Uncomplicated    Clinical Decision Making Low    Rehab Potential Good    PT Frequency 2x / week    PT Duration 4 weeks    PT Treatment/Interventions ADLs/Self Care Home Management;Moist Heat;Gait training;Functional mobility training;Balance training;Therapeutic exercise;Therapeutic activities;Manual techniques;Passive range of motion;Cryotherapy    PT  Next Visit Plan check stretches/HEP.  Wall for posture an strength, UBE reverse    PT Home Exercise Plan [id number]    Consulted and Agree with Plan of Care Patient;Family member/caregiver    Family Member Consulted DIL             Patient will benefit from skilled therapeutic intervention in order to improve the following deficits and impairments:  Decreased endurance, Decreased mobility, Difficulty walking, Cardiopulmonary status limiting activity, Decreased activity tolerance, Decreased strength, Postural dysfunction, Pain, Impaired flexibility, Impaired UE functional use, Decreased scar mobility, Decreased range of motion  Visit Diagnosis: Impaired mobility and endurance  Muscle weakness (generalized)  Rotator cuff syndrome of right shoulder  Chronic right shoulder pain  Lateral pain of left hip     Problem List Patient Active Problem List   Diagnosis Date Noted   S/P thoracentesis    Pleural effusion on left 03/10/2021    Gar Ponto MS, PT 04/17/21 5:36 PM   [facility] [address] [place], [place], [address] Phone: [phone]   Fax:  [phone]  Name: Gail Fitzgerald MRN: [id number] Date of Birth: 1934/10/19

## 2021-04-18 ENCOUNTER — Encounter: Payer: Self-pay | Admitting: Student

## 2021-04-18 ENCOUNTER — Ambulatory Visit: Payer: Medicaid Other | Admitting: Student

## 2021-04-18 ENCOUNTER — Telehealth: Payer: Self-pay | Admitting: Student

## 2021-04-18 VITALS — BP 118/78 | HR 67 | Ht 61.0 in | Wt 115.4 lb

## 2021-04-18 DIAGNOSIS — R222 Localized swelling, mass and lump, trunk: Secondary | ICD-10-CM

## 2021-04-18 DIAGNOSIS — J90 Pleural effusion, not elsewhere classified: Secondary | ICD-10-CM

## 2021-04-18 DIAGNOSIS — J948 Other specified pleural conditions: Secondary | ICD-10-CM

## 2021-04-18 DIAGNOSIS — N2889 Other specified disorders of kidney and ureter: Secondary | ICD-10-CM | POA: Diagnosis not present

## 2021-04-18 LAB — CHOLESTEROL, BODY FLUID: Cholesterol, Fluid: 79 mg/dL

## 2021-04-18 NOTE — Patient Instructions (Signed)
-   I will reach out to the radiologist who will perform your biopsy. If they think we should perform a thoracentesis (procedure to remove liquid around lung) before your biopsy I will  let you know and we will try to schedule it for 04/23/21  - Otherwise we will await the results of your biopsy on 04/24/21 to guide management of the fluid around your lung

## 2021-04-18 NOTE — Telephone Encounter (Signed)
Reviewed case with IR. They believe they can get adequate sampling of pleural mass without necessarily requiring thoracentesis first even if there is some reaccumulation. If she has reaccumulation of effusion AND develops dyspnea, IR said they could perform therapeutic thoracentesis at time of her CT guided biopsy. Called the patient's daughter-in-law with phone interpreter and relayed this to her. All questions addressed.

## 2021-04-19 ENCOUNTER — Other Ambulatory Visit: Payer: Self-pay

## 2021-04-19 ENCOUNTER — Ambulatory Visit: Payer: Medicaid Other

## 2021-04-19 VITALS — HR 70

## 2021-04-19 DIAGNOSIS — M6281 Muscle weakness (generalized): Secondary | ICD-10-CM

## 2021-04-19 DIAGNOSIS — Z7409 Other reduced mobility: Secondary | ICD-10-CM | POA: Diagnosis not present

## 2021-04-19 DIAGNOSIS — M75101 Unspecified rotator cuff tear or rupture of right shoulder, not specified as traumatic: Secondary | ICD-10-CM

## 2021-04-19 DIAGNOSIS — M25552 Pain in left hip: Secondary | ICD-10-CM

## 2021-04-19 DIAGNOSIS — G8929 Other chronic pain: Secondary | ICD-10-CM

## 2021-04-19 DIAGNOSIS — M25511 Pain in right shoulder: Secondary | ICD-10-CM

## 2021-04-19 NOTE — Therapy (Signed)
[place], [place], [address] Phone: [phone]   Fax:  [phone]  Physical Therapy Treatment  Patient Details  Name: Gail Fitzgerald MRN: [id number] Date of Birth: 04/03/1935 Referring Provider (PT): Dr Domenic Polite   Encounter Date: 04/19/2021   PT End of Session - 04/19/21 1114     Visit Number 4    Number of Visits 8    Date for PT Re-Evaluation 05/07/21    Authorization Type [place] [employer]    PT Start Time 1102    PT Stop Time 1145    PT Time Calculation (min) 43 min    Activity Tolerance Patient tolerated treatment well    Behavior During Therapy [facility] for tasks assessed/performed             Past Medical History:  Diagnosis Date   Hypertension     Past Surgical History:  Procedure Laterality Date   IR THORACENTESIS ASP PLEURAL SPACE W/IMG GUIDE  04/05/2021   THORACENTESIS N/A 04/12/2021   Procedure: Mathews Robinsons;  Surgeon: [name], DO;  Location: [place];  Service: Pulmonary;  Laterality: N/A;    Vitals:   04/19/21 1111  Pulse: 70  SpO2: 98%     Subjective Assessment - 04/19/21 1109     Subjective Pt reports she is doing well and being active at home. She notes she is walking for ex and using purse lip breathing for recovery of SOB.    Patient Stated Goals Feel more energy    Currently in Pain? Yes    Pain Score 3     Pain Location Back    Pain Orientation Mid    Pain Descriptors / Indicators Aching;Tightness    Pain Type Chronic pain    Pain Radiating Towards back to ribs on L side under breast    Pain Onset More than a month ago    Pain Frequency Intermittent                               [facility] Adult PT Treatment/Exercise - 04/19/21 0001       Lumbar Exercises: Stretches   Other Lumbar Stretch Exercise sidelying chest opener, upper trunk rotation 10x, L and R      Lumbar Exercises: Aerobic   Nustep 8 mins, L4; UEs/LEs   93%;  HR 84. Pt reports no increased exertion     Lumbar Exercises: Seated   Sit to Stand 10 reps      Lumbar Exercises: Supine   Pelvic Tilt 10 reps   3 sec   Pelvic Tilt Limitations with hand reaches toward ankles    Dead Bug 10 reps   2 sets   Dead Bug Limitations 90/90 legs      Knee/Hip Exercises: Standing   Hip Abduction Right;Left;20 reps    Abduction Limitations counter      Shoulder Exercises: Standing   Extension Both;20 reps    Theraband Level (Shoulder Extension) Level 3 (Green)    Row Both;15 reps    Theraband Level (Shoulder Row) Level 3 (Green)    Shoulder Elevation Limitations d    Other Standing Exercises Chest press; Shoulder press 60d; green Tband; 10x3                         PT Long Term Goals - 04/10/21 1508       PT LONG TERM  GOAL #1   Title Pt will be Ind in a final HEP for stretching and strengthening    Status On-going      PT LONG TERM GOAL #2   Title Pt will report household and community ambulation, endurance improved for that of pre-hopsitalization level    Status On-going      PT LONG TERM GOAL #3   Title Pt will report completion of household actiities and/or gardening with pain level not exceeding 3/10 in back, min fatigue.    Status On-going      PT LONG TERM GOAL #4   Title Pt will be able to increase 2 min walk test distance to 240 feet or more    Status On-going                   Plan - 04/19/21 1115     Clinical Impression Statement PT was completed for strengthening of UEs, LEs, core and for activity tolerance. Pt's response CV repsonse to the NuStep was minimal. Pt tolerated today's session well completing her exs at a better pace. Pt utilized pursed lip breathing for SOB as needed during today's session. Pt will continue to benefit from skilled PT to improve her strength and endurance to optimize    Personal Factors and Comorbidities Age;Comorbidity 2    Comorbidities hospitalization, HTN     Examination-Activity Limitations Bathing;Squat;Stairs;Lift;Locomotion Level;Bend;Stand    Examination-Participation Restrictions Cleaning;Community Activity;Yard Work;Shop;Laundry    Stability/Clinical Decision Making Stable/Uncomplicated    Clinical Decision Making Low    Rehab Potential Good    PT Frequency 2x / week    PT Duration 4 weeks    PT Treatment/Interventions ADLs/Self Care Home Management;Moist Heat;Gait training;Functional mobility training;Balance training;Therapeutic exercise;Therapeutic activities;Manual techniques;Passive range of motion;Cryotherapy    PT Next Visit Plan check stretches/HEP.  Wall for posture an strength, UBE reverse    PT Home Exercise Plan [id number]    Consulted and Agree with Plan of Care Patient;Family member/caregiver             Patient will benefit from skilled therapeutic intervention in order to improve the following deficits and impairments:  Decreased endurance, Decreased mobility, Difficulty walking, Cardiopulmonary status limiting activity, Decreased activity tolerance, Decreased strength, Postural dysfunction, Pain, Impaired flexibility, Impaired UE functional use, Decreased scar mobility, Decreased range of motion  Visit Diagnosis: Impaired mobility and endurance  Muscle weakness (generalized)  Rotator cuff syndrome of right shoulder  Chronic right shoulder pain  Lateral pain of left hip     Problem List Patient Active Problem List   Diagnosis Date Noted   S/P thoracentesis    Pleural effusion on left 03/10/2021    Gar Ponto MS, PT 04/19/21 12:57 PM   [facility] [address] [place], [place], [address] Phone: [phone]   Fax:  [phone]  Name: Gail Fitzgerald MRN: [id number] Date of Birth: Feb 05, 1935

## 2021-04-20 ENCOUNTER — Ambulatory Visit (HOSPITAL_COMMUNITY): Payer: Medicaid Other

## 2021-04-23 ENCOUNTER — Other Ambulatory Visit: Payer: Self-pay | Admitting: Internal Medicine

## 2021-04-24 ENCOUNTER — Ambulatory Visit (HOSPITAL_COMMUNITY)
Admission: RE | Admit: 2021-04-24 | Discharge: 2021-04-24 | Disposition: A | Discharge status: Home / Self Care | Payer: Medicaid Other | Source: Ambulatory Visit | Attending: Interventional Radiology | Admitting: Interventional Radiology

## 2021-04-24 ENCOUNTER — Encounter (HOSPITAL_COMMUNITY): Payer: Self-pay

## 2021-04-24 ENCOUNTER — Other Ambulatory Visit: Payer: Self-pay

## 2021-04-24 ENCOUNTER — Ambulatory Visit: Payer: Medicaid Other | Admitting: Physical Therapy

## 2021-04-24 ENCOUNTER — Ambulatory Visit (HOSPITAL_COMMUNITY)
Admission: RE | Admit: 2021-04-24 | Discharge: 2021-04-24 | Disposition: A | Discharge status: Home / Self Care | Payer: Medicaid Other | Source: Ambulatory Visit | Attending: Adult Health | Admitting: Adult Health

## 2021-04-24 DIAGNOSIS — Z7982 Long term (current) use of aspirin: Secondary | ICD-10-CM | POA: Diagnosis not present

## 2021-04-24 DIAGNOSIS — Z79899 Other long term (current) drug therapy: Secondary | ICD-10-CM | POA: Insufficient documentation

## 2021-04-24 DIAGNOSIS — I10 Essential (primary) hypertension: Secondary | ICD-10-CM | POA: Insufficient documentation

## 2021-04-24 DIAGNOSIS — Z9889 Other specified postprocedural states: Secondary | ICD-10-CM

## 2021-04-24 DIAGNOSIS — C782 Secondary malignant neoplasm of pleura: Secondary | ICD-10-CM | POA: Insufficient documentation

## 2021-04-24 DIAGNOSIS — J948 Other specified pleural conditions: Secondary | ICD-10-CM | POA: Diagnosis present

## 2021-04-24 LAB — PROTIME-INR
INR: 1 (ref 0.8–1.2)
Prothrombin Time: 13.5 seconds (ref 11.4–15.2)

## 2021-04-24 LAB — CBC
HCT: 35.1 % — ABNORMAL LOW (ref 36.0–46.0)
Hemoglobin: 11.6 g/dL — ABNORMAL LOW (ref 12.0–15.0)
MCH: 29.2 pg (ref 26.0–34.0)
MCHC: 33 g/dL (ref 30.0–36.0)
MCV: 88.4 fL (ref 80.0–100.0)
Platelets: 290 10*3/uL (ref 150–400)
RBC: 3.97 MIL/uL (ref 3.87–5.11)
RDW: 13.6 % (ref 11.5–15.5)
WBC: 9.6 10*3/uL (ref 4.0–10.5)
nRBC: 0 % (ref 0.0–0.2)

## 2021-04-24 MED ORDER — SODIUM CHLORIDE 0.9 % IV SOLN: INTRAVENOUS | Status: DC

## 2021-04-24 MED ORDER — FENTANYL CITRATE (PF) 100 MCG/2ML IJ SOLN
INTRAMUSCULAR | Status: AC | PRN
  Administered 2021-04-24: 50 ug via INTRAVENOUS

## 2021-04-24 MED ORDER — MIDAZOLAM HCL 2 MG/2ML IJ SOLN
INTRAMUSCULAR | Status: AC
  Filled 2021-04-24: qty 2

## 2021-04-24 MED ORDER — LIDOCAINE HCL 1 % IJ SOLN
INTRAMUSCULAR | Status: AC
  Filled 2021-04-24: qty 10

## 2021-04-24 MED ORDER — FENTANYL CITRATE (PF) 100 MCG/2ML IJ SOLN
INTRAMUSCULAR | Status: AC
  Filled 2021-04-24: qty 2

## 2021-04-24 MED ORDER — MIDAZOLAM HCL 2 MG/2ML IJ SOLN
INTRAMUSCULAR | Status: AC | PRN
  Administered 2021-04-24 (×2): 1 mg via INTRAVENOUS

## 2021-04-24 NOTE — Procedures (Signed)
Interventional Radiology Procedure Note  Procedure: CT guided biopsy of left pleural mass  Indication: FDG avid left pleural mass  Findings: Please refer to procedural dictation for full description.  Complications: None  EBL: < 10 mL  Miachel Roux, MD [phone]

## 2021-04-24 NOTE — H&P (Signed)
Chief Complaint: Patient was seen in consultation today for left pleural mass biopsy   Referring Physician(s): Parrett,Tammy S Dr. Leslye Peer  Supervising Physician: Mir, Sharen Heck  Patient Status: [facility] - Out-pt  History of Present Illness:  Gail Fitzgerald is a 86 y.o. female   Hx of HTN, dyspnea and recurrent left pleural effusion   3 past thoracentesis with negative cytology  PET+ left pleural mass result 04/13/21  Pt is here today for L pleural mass biopsy and possible thoracentesis if needed  Interpreter at bedside   Past Medical History:  Diagnosis Date   Hypertension     Past Surgical History:  Procedure Laterality Date   IR THORACENTESIS ASP PLEURAL SPACE W/IMG GUIDE  04/05/2021   THORACENTESIS N/A 04/12/2021   Procedure: Mathews Robinsons;  Surgeon: [name], DO;  Location: [place];  Service: Pulmonary;  Laterality: N/A;    Allergies: Patient has no known allergies.  Medications: Prior to Admission medications   Medication Sig Start Date End Date Taking? Authorizing Provider  acetaminophen (TYLENOL) 500 MG tablet Take 500 mg by mouth every 6 (six) hours as needed for moderate pain.   Yes [provider]  aspirin EC 81 MG tablet Take 81 mg by mouth every evening. Swallow whole.   Yes [provider]  benazepril (LOTENSIN) 10 MG tablet Take 10 mg by mouth 2 (two) times daily. 04/01/21  Yes [provider]  ibuprofen (ADVIL) 200 MG tablet Take 400 mg by mouth every 8 (eight) hours as needed (pain.).   Yes [provider]  Magnesium 400 MG CAPS Take 400 mg by mouth See admin instructions. Takes 1 tablet (400 mg) by mouth once daily 10 days on and 10 days off.   Yes [provider]  metoprolol succinate (TOPROL-XL) 100 MG 24 hr tablet Take 50 mg by mouth in the morning and at bedtime. 03/13/21  Yes [provider]  NON FORMULARY Take 30 drops by mouth daily as needed (anxiety). Valocordin (Adonis  extract/[facility])-  (Russian Supplement)   Yes [provider]  OVER THE COUNTER MEDICATION Take 1 tablet by mouth daily as needed (for stomach discomfort). Allochol - (Herbal Supplement) Multivitamin with : Magnesium, potassium, garlic and charcoal. (Russian Medication)   Yes [provider]  simvastatin (ZOCOR) 20 MG tablet Take 20 mg by mouth every evening.   Yes [provider]  UNABLE TO FIND Take 1 tablet by mouth daily as needed (chest pain (angina)). Med Name: Validol (Menthyl isovalerate and menthol) (Russian Medication)   Yes [provider]     Family History  Problem Relation Age of Onset   Diabetes Neg Hx    Kidney disease Neg Hx     Social History   Socioeconomic History   Marital status: Widowed    Spouse name: Not on file   Number of children: Not on file   Years of education: Not on file   Highest education level: Not on file  Occupational History   Not on file  Tobacco Use   Smoking status: Never   Smokeless tobacco: Never  Substance and Sexual Activity   Alcohol use: Not Currently   Drug use: Not Currently   Sexual activity: Not Currently  Other Topics Concern   Not on file  Social History Narrative   Not on file   Social Determinants of Health   Financial Resource Strain: Not on file  Food Insecurity: Not on file  Transportation  Needs: Not on file  Physical Activity: Not on file  Stress: Not on file  Social Connections: Not on file     Review of Systems  Constitutional:  Negative for chills and fever.  Respiratory:  Negative for shortness of breath.   Cardiovascular:  Negative for chest pain.  Gastrointestinal:  Negative for abdominal pain and vomiting.   Vital Signs: BP (!) 184/73 (BP Location: Left Arm)   Pulse 77   Temp 97.8 F (36.6 C) (Oral)   Resp 20   Ht 5\' 1"  (1.549 m)   Wt 112 lb 7 oz (51 kg)   SpO2 99%   BMI 21.24 kg/m    Physical Exam Constitutional:      Appearance: Normal appearance.  HENT:     Mouth/Throat:     Mouth: Mucous membranes are moist.     Pharynx: Oropharynx is clear.  Cardiovascular:     Rate and Rhythm: Normal rate and regular rhythm.     Heart sounds: Normal heart sounds.  Pulmonary:     Effort: Pulmonary effort is normal.     Breath sounds: Normal breath sounds. No wheezing, rhonchi or rales.  Abdominal:     General: There is no distension.     Palpations: Abdomen is soft.     Tenderness: There is no abdominal tenderness. There is no guarding.  Musculoskeletal:     Right lower leg: No edema.     Left lower leg: No edema.  Skin:    General: Skin is warm and dry.  Neurological:     Mental Status: She is alert and oriented to person, place, and time.  Psychiatric:        Mood and Affect: Mood normal.        Behavior: Behavior normal.        Thought Content: Thought content normal.        Judgment: Judgment normal.    Imaging: DG Chest 1 View  Result Date: 04/05/2021 CLINICAL DATA:  Post left thoracentesis for pleural effusion EXAM: CHEST  1 VIEW COMPARISON:  04/04/2021 FINDINGS: Moderate left pleural effusion is significantly decreased. Remains adjacent atelectasis/consolidation with improvement in lung aeration. Normal heart size. No pneumothorax. IMPRESSION: Decreased left pleural effusion with improvement in left lung aeration. No pneumothorax. Electronically Signed   By: Macy Mis M.D.   On: 04/05/2021 15:01   DG Chest 2 View  Result Date: 04/11/2021 CLINICAL DATA:  Pleural effusion EXAM: CHEST - 2 VIEW COMPARISON:  04/05/2021 FINDINGS: Large left pleural effusion again noted, stable. Left lower lobe atelectasis. Right lung clear. Heart is normal size. IMPRESSION: Stable large left pleural effusion with left lower lobe atelectasis. No real change. Electronically Signed   By: Rolm Baptise M.D.   On: 04/11/2021 10:21   DG Chest 2 View  Result Date:  04/04/2021 CLINICAL DATA:  Pleural effusion EXAM: CHEST - 2 VIEW COMPARISON:  03/13/2021 FINDINGS: Enlarging left pleural effusion, now large. No effusion or confluent opacity on the right. Cardiomegaly. Aortic atherosclerosis. No acute bony abnormality. IMPRESSION: Enlarging left pleural effusion, now large. Electronically Signed   By: Rolm Baptise M.D.   On: 04/04/2021 11:01   NM PET Image Initial (PI) Skull Base To Thigh  Result Date: 04/13/2021 CLINICAL DATA:  Initial treatment strategy for suspected malignant pleural effusion. EXAM: NUCLEAR MEDICINE PET SKULL BASE TO THIGH TECHNIQUE: 5.9 mCi F-18 FDG was injected intravenously. Full-ring PET imaging was performed from the skull base to thigh after the radiotracer. CT data was  obtained and used for attenuation correction and anatomic localization. Fasting blood glucose: 102 mg/dl COMPARISON:  Chest CT 03/11/2021 FINDINGS: Mediastinal blood pool activity: SUV max 2.3 Liver activity: SUV max NA NECK: Activity along the anterior tongue with maximum SUV of 7.6, no visible mass on the CT data, likely physiologic. Incidental CT findings: Mildly hyperdense left thyroid nodule 1.4 cm in long axis on image 36 series 4. This lesion is not hypermetabolic relative to the rest of the thyroid gland. Not clinically significant; no follow-up imaging recommended (ref: J Am Coll Radiol. 2015 Feb;12(2): 143-50). CHEST: Extensive hypermetabolic left pleural nodularity forming a substantial rind along much of the pleural surface. Posterior to the descending thoracic aorta this measures up to 1.8 cm in thickness (image 44, series 4) and the maximum SUV of the associated thick pleural rind is 22.9, compatible with malignancy. In addition to more rind like regions of pleural hypermetabolic activity there are multifocal solitary pleural nodules. There is evidence of old granulomatous disease with calcified right lower paratracheal and right hilar lymph nodes. Mild associated  hypermetabolic activity noted at the right hilum with a small lymph node having a maximum SUV of 5.5. Small subcarinal lymph node with maximum SUV 3.5. A right apical nodule with peripheral calcification measuring 1.3 by 0.6 cm on image 39 series 4 has a maximum SUV of 1.2, favoring benign etiology. Incidental CT findings: Coronary, aortic arch, and branch vessel atherosclerotic vascular disease. Ascending thoracic aortic aneurysm 4.3 cm in diameter on image 65 series 4. ABDOMEN/PELVIS: Hypermetabolic 5.0 by 3.3 cm exophytic mass of the left kidney lower pole on image 112 series 4, maximum SUV approximately 17.4, compatible with malignancy. Photopenic cyst of the left kidney upper pole, and anteriorly of the left mid kidney. Incidental CT findings: Cholelithiasis. Aortoiliac atherosclerotic vascular disease. Descending and sigmoid colon diverticulosis. Suspected pelvic floor laxity. SKELETON: No significant abnormal hypermetabolic activity in this region. Incidental CT findings: Bony demineralization. IMPRESSION: 1. Extensive hypermetabolic pleural tumor forming a rind like regions along the left hemithorax, and with additional multifocal pleural nodules compatible with active malignancy. 2. 5.0 cm exophytic solid mass from the left kidney lower pole is highly hypermetabolic, favoring renal cell carcinoma or less likely a metastatic lesion to the kidney. 3. Mildly hypermetabolic right hilar and subcarinal adenopathy may also represent metastatic involvement. 4. Ascending thoracic aortic aneurysm 4.3 cm in diameter. Recommend annual imaging followup by CTA or MRA. This recommendation follows 2010 ACCF/AHA/AATS/ACR/ASA/SCA/SCAI/SIR/STS/SVM Guidelines for the Diagnosis and Management of Patients with Thoracic Aortic Disease. Circulation. 2010; 121: P710-G269. Aortic aneurysm NOS (ICD10-I71.9) 5. Other imaging findings of potential clinical significance: Aortic Atherosclerosis (ICD10-I70.0). Coronary atherosclerosis.  Photopenic renal cysts. Cholelithiasis. Descending and sigmoid colon diverticulosis. Pelvic floor laxity. Bony demineralization. Electronically Signed   By: Van Clines M.D.   On: 04/13/2021 13:46   DG CHEST PORT 1 VIEW  Result Date: 04/12/2021 CLINICAL DATA:  Left thoracentesis EXAM: PORTABLE CHEST 1 VIEW COMPARISON:  04/11/2021 FINDINGS: Single frontal view of the chest demonstrates near complete resolution of the left pleural effusion after interval left-sided thoracentesis. Minimal consolidation within the left lower lobe. Chronic areas of left pleural thickening again noted. No evidence pneumothorax. The right chest is clear. No acute bony abnormalities. IMPRESSION: 1. Near complete resolution of left pleural effusion after thoracentesis. No evidence pneumothorax. 2. Residual left basilar consolidation and chronic left pleural thickening as above. Electronically Signed   By: Randa Ngo M.D.   On: 04/12/2021 17:57   IR THORACENTESIS ASP PLEURAL  SPACE W/IMG GUIDE  Result Date: 04/05/2021 INDICATION: Shortness of breast due to recurrent left pleural effusion. Request for therapeutic and diagnostic thoracentesis. EXAM: ULTRASOUND GUIDED LEFT THORACENTESIS MEDICATIONS: 10 mL 1% lidocaine COMPLICATIONS: None immediate. PROCEDURE: An ultrasound guided thoracentesis was thoroughly discussed with the patient and questions answered. The benefits, risks, alternatives and complications were also discussed. The patient understands and wishes to proceed with the procedure. Written consent was obtained. Ultrasound was performed to localize and mark an adequate pocket of fluid in the left chest. The area was then prepped and draped in the normal sterile fashion. 1% Lidocaine was used for local anesthesia. Under ultrasound guidance a 6 Fr Safe-T-Centesis catheter was introduced. Thoracentesis was performed. The catheter was removed and a dressing applied. FINDINGS: A total of approximately 1.5 L of clear amber  fluid was removed. Samples were sent to the laboratory as requested by the clinical team. Post procedure CXR ordered, results pending. IMPRESSION: Successful ultrasound guided left thoracentesis yielding 1.5 L of pleural fluid. Read by: Durenda Guthrie, PA-C Electronically Signed   By: Markus Daft M.D.   On: 04/05/2021 15:01    Labs:  CBC: Recent Labs    03/10/21 0900 03/11/21 0151 03/11/21 2356 03/13/21 0012  WBC 9.9 10.8* 10.0 10.3  HGB 12.6 13.6 12.7 12.8  HCT 39.8 40.6 39.8 39.9  PLT 238 224 242 228    COAGS: Recent Labs    03/11/21 0151  INR 1.1  APTT 28    BMP: Recent Labs    03/10/21 0900 03/11/21 0151 03/11/21 2356 03/13/21 0012  NA 132* 133* 134* 134*  K 4.6 5.0 4.2 4.7  CL 98 95* 98 98  CO2 25 25 26 28   GLUCOSE 103* 98 101* 111*  BUN 20 21 19  27*  CALCIUM 8.9 9.3 8.8* 8.6*  CREATININE 1.00 1.11* 1.08* 0.97  GFRNONAA 56* 49* 51* 58*    LIVER FUNCTION TESTS: Recent Labs    03/10/21 1849 03/11/21 0151  BILITOT 1.2 1.0  AST 20 20  ALT 9 9  ALKPHOS 91 88  PROT 7.9 7.0  ALBUMIN 4.1 3.7    TUMOR MARKERS: No results for input(s): AFPTM, CEA, CA199, CHROMGRNA in the last 8760 hours.  Assessment and Plan:  Pt lying in bed. She is calm and cooperative.   Interpreter at bedside.  She is in NAD  Per Dr. Verlee Monte note 04/18/21:  Order for CT guided pleural nodule biopsy placed targeting left posterobasal mass already placed, scheduled 04/24/21  Perform thoracentesis if there is reaccumulation of pleural fluid.    Risks and benefits of image guided left pleural mass biopsy was discussed with the patient with her interpreter including but not limited to bleeding, infection, damage to adjacent structures or low yield requiring additional tests.  All of the questions were answered and there is agreement to proceed.  Consent signed and in chart.  Pt also consented for possible thoracentesis if necessary  Thank you for this interesting consult.  I greatly  enjoyed meeting Jennifer Payes and look forward to participating in their care.  A copy of this report was sent to the requesting provider on this date.  Electronically Signed: Tyson Alias, NP 04/24/2021, 9:59 AM   I spent a total of 30 Minutes  n face to face in clinical consultation, greater than 50% of which was counseling/coordinating care for left pleural mass biopsy and possible thoracentesis if needed.

## 2021-04-26 ENCOUNTER — Ambulatory Visit: Payer: Medicaid Other

## 2021-04-26 ENCOUNTER — Other Ambulatory Visit: Payer: Self-pay | Admitting: Student

## 2021-04-26 ENCOUNTER — Other Ambulatory Visit: Payer: Self-pay

## 2021-04-26 ENCOUNTER — Telehealth: Payer: Self-pay | Admitting: Student

## 2021-04-26 VITALS — HR 66

## 2021-04-26 DIAGNOSIS — Z7409 Other reduced mobility: Secondary | ICD-10-CM | POA: Diagnosis not present

## 2021-04-26 DIAGNOSIS — M6281 Muscle weakness (generalized): Secondary | ICD-10-CM

## 2021-04-26 DIAGNOSIS — C799 Secondary malignant neoplasm of unspecified site: Secondary | ICD-10-CM

## 2021-04-26 NOTE — Telephone Encounter (Signed)
Called and discussed results of CT-guided biopsy with the patient's daughter-in-law, plan for urgent referral to medical and radiation oncology. She prefers that biopsy results be disclosed to the patient at time of clinic appointment with medical oncology rather than by phone.

## 2021-04-26 NOTE — Therapy (Signed)
[place], [place], [address] Phone: [phone]   Fax:  [phone]  Physical Therapy Treatment  Patient Details  Name: Marcianne Ozbun MRN: [id number] Date of Birth: 12-27-1934 Referring Provider (PT): Dr Domenic Polite   Encounter Date: 04/26/2021   PT End of Session - 04/26/21 1059     Visit Number 5    Number of Visits 8    Date for PT Re-Evaluation 05/07/21    Authorization Type [place] [employer]    PT Start Time 1100    PT Stop Time 1142    PT Time Calculation (min) 42 min    Activity Tolerance Patient tolerated treatment well    Behavior During Therapy [facility] for tasks assessed/performed             Past Medical History:  Diagnosis Date   Hypertension     Past Surgical History:  Procedure Laterality Date   IR THORACENTESIS ASP PLEURAL SPACE W/IMG GUIDE  04/05/2021   THORACENTESIS N/A 04/12/2021   Procedure: Mathews Robinsons;  Surgeon: [name], DO;  Location: [place];  Service: Pulmonary;  Laterality: N/A;    Vitals:   04/26/21 1115  Pulse: 66  SpO2: 97%     Subjective Assessment - 04/26/21 1106     Subjective Pt reports she is feeling weak today and requested PT be at a lower tensity level. Pt states she has had xrays and a biopsy of her lungs.    Patient is accompained by: Interpreter    Currently in Pain? No/denies                               [facility] Adult PT Treatment/Exercise - 04/26/21 0001       Exercises   Exercises Lumbar;Knee/Hip      Lumbar Exercises: Aerobic   Nustep 5 mins, L4; UEs/LEs   96%; HR 74. Pt reports no increased exertion     Lumbar Exercises: Standing   Row Both;10 reps    Theraband Level (Row) Level 2 (Red)    Shoulder Extension Both;10 reps    Theraband Level (Shoulder Extension) Level 2 (Red)    Other Standing Lumbar Exercises Chest press, shoulder press, elbow flex 10x each with red Tband      Lumbar Exercises:  Seated   Long Arc Quad on Chair Right;Left;2 sets;10 reps    LAQ on Chair Weights (lbs) 4    Sit to Stand 10 reps                 Balance Exercises - 04/26/21 0001       Balance Exercises: Standing   Standing Eyes Opened Narrow base of support (BOS);2 reps;30 secs;Foam/compliant surface   CGA   Standing Eyes Closed Wide (BOA);2 reps;30 secs;Foam/compliant surface   CGA   Marching Foam/compliant surface;20 reps   2 sets                   PT Long Term Goals - 04/10/21 1508       PT LONG TERM GOAL #1   Title Pt will be Ind in a final HEP for stretching and strengthening    Status On-going      PT LONG TERM GOAL #2   Title Pt will report household and community ambulation, endurance improved for that of pre-hopsitalization level    Status On-going      PT LONG TERM GOAL #3  Title Pt will report completion of household actiities and/or gardening with pain level not exceeding 3/10 in back, min fatigue.    Status On-going      PT LONG TERM GOAL #4   Title Pt will be able to increase 2 min walk test distance to 240 feet or more    Status On-going                   Plan - 04/26/21 1100     Clinical Impression Statement PT was continued for strengthening of UEs, LEs, and core, activity tolerance, and balance. CV response to NuStep continues to be minimal. Pt did not experience SOB during today's session. Pt has 2 more scheduled appts. Will assess functional goals and begin to establish final HEP. Pt continues to report weakness, but tolerated the session well. Pt underwent recent tests for cause of pleural effusions requiring 3 thoracentesis.    Personal Factors and Comorbidities Age;Comorbidity 2    Comorbidities hospitalization, HTN    Examination-Activity Limitations Bathing;Squat;Stairs;Lift;Locomotion Level;Bend;Stand    Examination-Participation Restrictions Cleaning;Community Activity;Yard Work;Shop;Laundry    Stability/Clinical Decision Making  Stable/Uncomplicated    Clinical Decision Making Low    Rehab Potential Good    PT Frequency 2x / week    PT Duration 4 weeks    PT Treatment/Interventions ADLs/Self Care Home Management;Moist Heat;Gait training;Functional mobility training;Balance training;Therapeutic exercise;Therapeutic activities;Manual techniques;Passive range of motion;Cryotherapy    PT Next Visit Plan check stretches/HEP.  Wall for posture an strength, UBE reverse    PT Home Exercise Plan [id number]    Consulted and Agree with Plan of Care Patient;Family member/caregiver    Family Member Consulted DIL             Patient will benefit from skilled therapeutic intervention in order to improve the following deficits and impairments:  Decreased endurance, Decreased mobility, Difficulty walking, Cardiopulmonary status limiting activity, Decreased activity tolerance, Decreased strength, Postural dysfunction, Pain, Impaired flexibility, Impaired UE functional use, Decreased scar mobility, Decreased range of motion  Visit Diagnosis: Impaired mobility and endurance  Muscle weakness (generalized)     Problem List Patient Active Problem List   Diagnosis Date Noted   S/P thoracentesis    Pleural effusion on left 03/10/2021    Gar Ponto MS, PT 04/26/21 1:27 PM   [facility] [facility] [address] [place], [place], [address] Phone: [phone]   Fax:  [phone]  Name: Tyleigh Mahn MRN: [id number] Date of Birth: [birth year]

## 2021-04-27 ENCOUNTER — Encounter: Payer: Self-pay | Admitting: *Deleted

## 2021-04-27 LAB — ACID FAST CULTURE WITH REFLEXED SENSITIVITIES (MYCOBACTERIA): Acid Fast Culture: NEGATIVE

## 2021-04-27 NOTE — Progress Notes (Signed)
I received a call back from pathology.  I was updated bx has results were cancer but histology could either be kidney or lung cancer. I will update Dr. Julien Nordmann to see how to schedule her.

## 2021-04-27 NOTE — Progress Notes (Signed)
I received referral on Gail Fitzgerald today. I was unsure of dx so I called pathology dept because I was unclear of dx.  I was updated they will call me back with more information.

## 2021-04-30 ENCOUNTER — Encounter: Payer: Self-pay | Admitting: *Deleted

## 2021-04-30 NOTE — Progress Notes (Signed)
I reviewed referral with Dr. Julien Nordmann. He is not clear if this is a lung or kidney process. He will see her on 8/31.  I updated new patient coordinator to call her and scheduled with Dr. Julien Nordmann and labs on 8/31.

## 2021-05-01 ENCOUNTER — Ambulatory Visit: Payer: Medicaid Other

## 2021-05-01 ENCOUNTER — Telehealth: Payer: Self-pay | Admitting: Internal Medicine

## 2021-05-01 ENCOUNTER — Other Ambulatory Visit: Payer: Self-pay

## 2021-05-01 DIAGNOSIS — Z7409 Other reduced mobility: Secondary | ICD-10-CM | POA: Diagnosis not present

## 2021-05-01 DIAGNOSIS — M6281 Muscle weakness (generalized): Secondary | ICD-10-CM

## 2021-05-01 LAB — SURGICAL PATHOLOGY

## 2021-05-01 NOTE — Therapy (Signed)
[place], [place], [address] Phone: [phone]   Fax:  [phone]  Physical Therapy Treatment/Discharge  Patient Details  Name: Gail Fitzgerald MRN: [id number] Date of Birth: 08/06/1935 Referring Provider (PT): Dr Domenic Polite   Encounter Date: 05/01/2021   PT End of Session - 05/01/21 1641     Visit Number 6    Number of Visits 8    Date for PT Re-Evaluation 05/07/21    Authorization Type [place] [employer]    PT Start Time 1555    PT Stop Time 1635    PT Time Calculation (min) 40 min    Activity Tolerance Patient tolerated treatment well    Behavior During Therapy [facility] for tasks assessed/performed             Past Medical History:  Diagnosis Date   Hypertension     Past Surgical History:  Procedure Laterality Date   IR THORACENTESIS ASP PLEURAL SPACE W/IMG GUIDE  04/05/2021   THORACENTESIS N/A 04/12/2021   Procedure: Mathews Robinsons;  Surgeon: [name], DO;  Location: [place];  Service: Pulmonary;  Laterality: N/A;    There were no vitals filed for this visit.   Subjective Assessment - 05/01/21 1636     Subjective Pt reports she has a Dx of cancer. She notes she feels weak.    Patient Stated Goals Feel more energy    Pain Score 0-No pain    Pain Location Back    Pain Descriptors / Indicators Aching    Pain Type Chronic pain    Pain Onset More than a month ago    Pain Frequency Intermittent                               [facility] Adult PT Treatment/Exercise - 05/01/21 1652       Exercises   Exercises Lumbar;Knee/Hip      Lumbar Exercises: Stretches   Lower Trunk Rotation 5 reps;10 seconds    Other Lumbar Stretch Exercise sidelying chest opener, upper trunk rotation 10x, L and R      Lumbar Exercises: Standing   Row Both;10 reps    Theraband Level (Row) Level 2 (Red)    Shoulder Extension Both;10 reps    Theraband Level (Shoulder Extension) Level  2 (Red)    Other Standing Lumbar Exercises Chest press, elbow flex 10x each with red Tband      Lumbar Exercises: Supine   Pelvic Tilt 10 reps   3 sec                   PT Education - 05/01/21 1640     Education Details FInal HEP    Person(s) Educated Patient    Methods Explanation;Demonstration;Tactile cues;Verbal cues;Handout    Comprehension Verbalized understanding;Returned demonstration;Verbal cues required;Tactile cues required                 PT Long Term Goals - 05/01/21 1643       PT LONG TERM GOAL #1   Title Pt will be Ind in a final HEP for stretching and strengthening    Status Achieved    Target Date 05/01/21      PT LONG TERM GOAL #2   Title Pt will report household and community ambulation, endurance improved for that of pre-hopsitalization level.05/01/21: Improved    Status Partially Met    Target Date 05/01/21  PT LONG TERM GOAL #3   Title Pt will report completion of household actiities and/or gardening with pain level not exceeding 3/10 in back, min fatigue. 05/01/21: Occasional bak painat a min level    Status Achieved    Target Date 05/01/21      PT LONG TERM GOAL #4   Title Pt will be able to increase 2 min walk test distance to 240 feet or more. 05/01/21: 295 ft    Status Achieved    Target Date 05/01/21                   Plan - 05/01/21 1642     Clinical Impression Statement Pt was recently Dxed with cancer and she wants to discontinued PT so she can focus on the care she will need related to this Dx. Pt's activity tolerance and strength have both improved with pt. meeting all of her goals. PT is Ind in a HEP to continue to work of her strength as tolerated.    Personal Factors and Comorbidities Age;Comorbidity 2    Comorbidities hospitalization, HTN    Examination-Activity Limitations Bathing;Squat;Stairs;Lift;Locomotion Level;Bend;Stand    Examination-Participation Restrictions Cleaning;Community Activity;Yard  Work;Shop;Laundry    Stability/Clinical Decision Making Stable/Uncomplicated    Clinical Decision Making Low    Rehab Potential Good    PT Frequency 2x / week    PT Duration 4 weeks    PT Treatment/Interventions ADLs/Self Care Home Management;Moist Heat;Gait training;Functional mobility training;Balance training;Therapeutic exercise;Therapeutic activities;Manual techniques;Passive range of motion;Cryotherapy    PT Home Exercise Plan [id number]    Consulted and Agree with Plan of Care Patient             Patient will benefit from skilled therapeutic intervention in order to improve the following deficits and impairments:  Decreased endurance, Decreased mobility, Difficulty walking, Cardiopulmonary status limiting activity, Decreased activity tolerance, Decreased strength, Postural dysfunction, Pain, Impaired flexibility, Impaired UE functional use, Decreased scar mobility, Decreased range of motion  Visit Diagnosis: Impaired mobility and endurance  Muscle weakness (generalized)     Problem List Patient Active Problem List   Diagnosis Date Noted   S/P thoracentesis    Pleural effusion on left 03/10/2021   Gar Ponto MS, PT 05/01/21 5:02 PM  PHYSICAL THERAPY DISCHARGE SUMMARY  Visits from Start of Care: 6  Current functional level related to goals / functional outcomes: See above   Remaining deficits: See above   Education / Equipment: HEP   Patient agrees to discharge. Patient goals were met. Patient is being discharged due to the patient's request.    [place], [place], [address] Phone: [phone]   Fax:  [phone]  Name: Gail Fitzgerald MRN: [id number] Date of Birth: Jun 23, 1935

## 2021-05-01 NOTE — Telephone Encounter (Signed)
Received anew pt referral from [facility] for lung cancer. Ms. Gail Fitzgerald has been scheduled to see Dr. Julien Nordmann on 8/31 at 11:45am w/labs at 11:15am. Appt date and time hasb een given to the pt's daughter in law. Aware to arrive 15 minutes early.

## 2021-05-08 ENCOUNTER — Other Ambulatory Visit: Payer: Self-pay | Admitting: *Deleted

## 2021-05-08 DIAGNOSIS — Z9889 Other specified postprocedural states: Secondary | ICD-10-CM

## 2021-05-09 ENCOUNTER — Inpatient Hospital Stay: Payer: Medicaid Other

## 2021-05-09 ENCOUNTER — Other Ambulatory Visit: Payer: Self-pay

## 2021-05-09 ENCOUNTER — Ambulatory Visit: Payer: Medicaid Other

## 2021-05-09 ENCOUNTER — Inpatient Hospital Stay: Payer: Medicaid Other | Attending: Internal Medicine | Admitting: Internal Medicine

## 2021-05-09 ENCOUNTER — Encounter: Payer: Self-pay | Admitting: *Deleted

## 2021-05-09 ENCOUNTER — Encounter: Payer: Self-pay | Admitting: Internal Medicine

## 2021-05-09 VITALS — BP 166/67 | HR 70 | Temp 97.8°F | Resp 17 | Ht 61.0 in | Wt 114.0 lb

## 2021-05-09 DIAGNOSIS — C7902 Secondary malignant neoplasm of left kidney and renal pelvis: Secondary | ICD-10-CM | POA: Diagnosis not present

## 2021-05-09 DIAGNOSIS — C7800 Secondary malignant neoplasm of unspecified lung: Secondary | ICD-10-CM | POA: Diagnosis not present

## 2021-05-09 DIAGNOSIS — Z79899 Other long term (current) drug therapy: Secondary | ICD-10-CM

## 2021-05-09 DIAGNOSIS — C801 Malignant (primary) neoplasm, unspecified: Secondary | ICD-10-CM | POA: Diagnosis present

## 2021-05-09 DIAGNOSIS — C642 Malignant neoplasm of left kidney, except renal pelvis: Secondary | ICD-10-CM | POA: Insufficient documentation

## 2021-05-09 DIAGNOSIS — Z9889 Other specified postprocedural states: Secondary | ICD-10-CM

## 2021-05-09 DIAGNOSIS — J90 Pleural effusion, not elsewhere classified: Secondary | ICD-10-CM | POA: Diagnosis not present

## 2021-05-09 DIAGNOSIS — Z5112 Encounter for antineoplastic immunotherapy: Secondary | ICD-10-CM | POA: Insufficient documentation

## 2021-05-09 DIAGNOSIS — I10 Essential (primary) hypertension: Secondary | ICD-10-CM

## 2021-05-09 LAB — CMP (CANCER CENTER ONLY)
ALT: <6 U/L (ref 0–44)
AST: 14 U/L — ABNORMAL LOW (ref 15–41)
Albumin: 3.7 g/dL (ref 3.5–5.0)
Alkaline Phosphatase: 96 U/L (ref 38–126)
Anion gap: 10 (ref 5–15)
BUN: 13 mg/dL (ref 8–23)
CO2: 25 mmol/L (ref 22–32)
Calcium: 9.7 mg/dL (ref 8.9–10.3)
Chloride: 101 mmol/L (ref 98–111)
Creatinine: 0.85 mg/dL (ref 0.44–1.00)
GFR, Estimated: >60 mL/min (ref >60)
Glucose, Bld: 131 mg/dL — ABNORMAL HIGH (ref 70–99)
Potassium: 4.4 mmol/L (ref 3.5–5.1)
Sodium: 136 mmol/L (ref 135–145)
Total Bilirubin: 0.6 mg/dL (ref 0.3–1.2)
Total Protein: 7.3 g/dL (ref 6.5–8.1)

## 2021-05-09 LAB — CBC WITH DIFFERENTIAL (CANCER CENTER ONLY)
Abs Immature Granulocytes: 0.05 K/uL (ref 0.00–0.07)
Basophils Absolute: 0.1 K/uL (ref 0.0–0.1)
Basophils Relative: 1 %
Eosinophils Absolute: 0.1 K/uL (ref 0.0–0.5)
Eosinophils Relative: 1 %
HCT: 36.7 % (ref 36.0–46.0)
Hemoglobin: 12 g/dL (ref 12.0–15.0)
Immature Granulocytes: 0 %
Lymphocytes Relative: 19 %
Lymphs Abs: 2.2 K/uL (ref 0.7–4.0)
MCH: 28.4 pg (ref 26.0–34.0)
MCHC: 32.7 g/dL (ref 30.0–36.0)
MCV: 87 fL (ref 80.0–100.0)
Monocytes Absolute: 1.3 K/uL — ABNORMAL HIGH (ref 0.1–1.0)
Monocytes Relative: 11 %
Neutro Abs: 7.9 K/uL — ABNORMAL HIGH (ref 1.7–7.7)
Neutrophils Relative %: 68 %
Platelet Count: 222 K/uL (ref 150–400)
RBC: 4.22 MIL/uL (ref 3.87–5.11)
RDW: 13.7 % (ref 11.5–15.5)
WBC Count: 11.6 K/uL — ABNORMAL HIGH (ref 4.0–10.5)
nRBC: 0 % (ref 0.0–0.2)

## 2021-05-09 MED ORDER — PROCHLORPERAZINE MALEATE 10 MG PO TABS: 10.0000 mg | ORAL_TABLET | Freq: Four times a day (QID) | ORAL | 0 refills | Status: DC | PRN

## 2021-05-09 NOTE — Progress Notes (Signed)
START OFF PATHWAY REGIMEN - Renal Cell   [id number]:Ipilimumab 1 mg/kg + Nivolumab 3 mg/kg q21 Days x 4 Cycles (Induction):   A cycle is every 21 days:     Nivolumab      Ipilimumab   **Always confirm dose/schedule in your pharmacy ordering system**  Patient Characteristics: Stage IV (Unresected T4M0 or Any T, M1)/Metastatic Disease, Non-Clear Cell, First Line Therapeutic Status: Stage IV (Unresected T4M0 or Any T, M1)/Metastatic Disease Histology: Non-Clear Cell Line of therapy: First Line Intent of Therapy: Non-Curative / Palliative Intent, Discussed with Patient

## 2021-05-09 NOTE — Progress Notes (Signed)
Per Dr. Julien Nordmann, I notified pathology to send tissue for molecular and PDL 1 testing to [facility].

## 2021-05-09 NOTE — Progress Notes (Signed)
[place] [phone]   [phone]  CONSULT NOTE  REFERRING PHYSICIAN: Dr. Leory Plowman Icard  REASON FOR CONSULTATION:  86 years old white female with metastatic carcinoma.  HPI Gail Fitzgerald is a 86 y.o. female with past medical history significant for hypertension, dyslipidemia, ascending thoracic aneurysm as well as history of stroke in 2005.  The patient mention that she has been complaining of back pain and shortness of breath for around 2 months.  She presented to the emergency department on March 10, 2021 for evaluation and chest x-ray at that time showed large left pleural effusion.  She also underwent CT scan of the chest on the same day and it showed moderate to large left pleural effusion.  On March 11, 2021 the patient underwent ultrasound-guided left thoracentesis with drainage of 700 cc of pleural fluid but the cytology was negative for malignancy. Repeat CT scan of the chest on March 11, 2021 showed decrease in the left pleural effusion and there was 1.0 x 0.5 cm nodular focus along the lateral left pleura that indeterminate.  Repeat ultrasound-guided left thoracentesis on April 05, 2021 drained 1.5 L of fluid and the cytology was also negative for malignancy.  On April 13, 2021 the patient had a PET scan performed and that showed extensive hypermetabolic left pleural nodularity forming a substantial rent along much of the pleural service.  Posterior to the descending thoracic aorta there is up to 1.8 cm thickness with SUV max of the associated sick pleural and of 22.9 compatible with malignancy.  There was also additional multifocal pleural nodules compatible with active malignancy.  The scan also showed 5.0 cm exophytic solid mass from the left kidney lower pole highly hypermetabolic and favoring renal cell carcinoma or less likely metastatic lesion to the kidney.  There was also mildly hypermetabolic right hilar and subcarinal adenopathy that may also  represent metastatic involvement. On April 24, 2021 the patient underwent CT-guided core biopsy of the FDG avid left pleural mass by interventional radiology. The final pathology [phone]) showed metastatic carcinoma. The neoplastic cells are positive for cytokeratin AE1/3 but are negative for cytokeratin 7, cytokeratin 20, cytokeratin 5/6, p40, mock 31, D2-40,  WT1 and calretinin. The neoplastic cells are positive for racemase ([id number]) but negative for CA-IX and CD117.  The immunoprofile is non-specific but papillary renal cell carcinoma is included in the differential.  The patient was referred to me today for evaluation and recommendation regarding treatment of her condition. When seen today she continues to have pain on the neck, left side of the chest as well as the lower left back moving around and feeling heaviness on the left side.  She also lost around 35 pounds in the last year.  She has no shortness of breath but continues to have cough with no hemoptysis.  She denied having any nausea, vomiting, diarrhea or constipation.  She denied having any headache or visual changes.  She has no fever or chills. Family history significant for mother who died from pulmonary embolism and father was murdered at young age. The patient is a widow and has 2 sons.  She was accompanied today by her son Sheppard Coil and her daughter-in-law [place].  The patient used to do office work.  She has no occupational exposure.  She has no history of smoking and drinks alcohol occasionally with no history of drug abuse. HPI  Past Medical History:  Diagnosis Date   Hypertension     Past Surgical History:  Procedure Laterality Date   IR THORACENTESIS ASP PLEURAL SPACE W/IMG GUIDE  04/05/2021   THORACENTESIS N/A 04/12/2021   Procedure: Mathews Robinsons;  Surgeon: [name], DO;  Location: [facility];  Service: Pulmonary;  Laterality: N/A;    Family History  Problem Relation Age of Onset   Diabetes Neg Hx     Kidney disease Neg Hx     Social History Social History   Tobacco Use   Smoking status: Never   Smokeless tobacco: Never  Substance Use Topics   Alcohol use: Not Currently   Drug use: Not Currently    No Known Allergies  Current Outpatient Medications  Medication Sig Dispense Refill   acetaminophen (TYLENOL) 500 MG tablet Take 500 mg by mouth every 6 (six) hours as needed for moderate pain.     aspirin EC 81 MG tablet Take 81 mg by mouth every evening. Swallow whole.     benazepril (LOTENSIN) 10 MG tablet Take 10 mg by mouth 2 (two) times daily.     ibuprofen (ADVIL) 200 MG tablet Take 400 mg by mouth every 8 (eight) hours as needed (pain.).     Magnesium 400 MG CAPS Take 400 mg by mouth See admin instructions. Takes 1 tablet (400 mg) by mouth once daily 10 days on and 10 days off.     metoprolol succinate (TOPROL-XL) 100 MG 24 hr tablet Take 50 mg by mouth in the morning and at bedtime.     NON FORMULARY Take 30 drops by mouth daily as needed (anxiety). Valocordin (Adonis extract/Lily of [facility])-  (Russian Supplement)     OVER THE COUNTER MEDICATION Take 1 tablet by mouth daily as needed (for stomach discomfort). Allochol - (Herbal Supplement) Multivitamin with : Magnesium, potassium, garlic and charcoal. (Russian Medication)     simvastatin (ZOCOR) 20 MG tablet Take 20 mg by mouth every evening.     UNABLE TO FIND Take 1 tablet by mouth daily as needed (chest pain (angina)). Med Name: Validol (Menthyl isovalerate and menthol) (Russian Medication)     No current facility-administered medications for this visit.    Review of Systems  Constitutional: positive for fatigue and weight loss Eyes: negative Ears, nose, mouth, throat, and face: negative Respiratory: positive for dyspnea on exertion and pleurisy/chest pain Cardiovascular: negative Gastrointestinal: negative Genitourinary:negative Integument/breast:  negative Hematologic/lymphatic: negative Musculoskeletal:positive for back pain Neurological: negative Behavioral/Psych: negative Endocrine: negative Allergic/Immunologic: negative  Physical Exam  RAX:ENMMH, healthy, no distress, well nourished, and well developed SKIN: skin color, texture, turgor are normal, no rashes or significant lesions HEAD: Normocephalic, No masses, lesions, tenderness or abnormalities EYES: normal, PERRLA, Conjunctiva are pink and non-injected EARS: External ears normal, Canals clear OROPHARYNX:no exudate, no erythema, and lips, buccal mucosa, and tongue normal  NECK: supple, no adenopathy, no JVD LYMPH:  no palpable lymphadenopathy, no hepatosplenomegaly BREAST:not examined LUNGS: coarse sounds heard, decreased breath sounds HEART: regular rate & rhythm, no murmurs, and no gallops ABDOMEN:abdomen soft, non-tender, normal bowel sounds, and no masses or organomegaly BACK: No CVA tenderness, Range of motion is normal EXTREMITIES:no joint deformities, effusion, or inflammation, no edema  NEURO: alert & oriented x 3 with fluent speech, no focal motor/sensory deficits  PERFORMANCE STATUS: ECOG 1  LABORATORY DATA: Lab Results  Component Value Date   WBC 11.6 (H) 05/09/2021   HGB 12.0 05/09/2021   HCT 36.7 05/09/2021   MCV 87.0 05/09/2021   PLT 222 05/09/2021      Chemistry  Component Value Date/Time   NA 136 05/09/2021 1111   K 4.4 05/09/2021 1111   CL 101 05/09/2021 1111   CO2 25 05/09/2021 1111   BUN 13 05/09/2021 1111   CREATININE 0.85 05/09/2021 1111      Component Value Date/Time   CALCIUM 9.7 05/09/2021 1111   ALKPHOS 96 05/09/2021 1111   AST 14 (L) 05/09/2021 1111   ALT <6 05/09/2021 1111   BILITOT 0.6 05/09/2021 1111       RADIOGRAPHIC STUDIES: DG Chest 2 View  Result Date: 04/11/2021 CLINICAL DATA:  Pleural effusion EXAM: CHEST - 2 VIEW COMPARISON:  04/05/2021 FINDINGS: Large left pleural effusion again noted, stable. Left  lower lobe atelectasis. Right lung clear. Heart is normal size. IMPRESSION: Stable large left pleural effusion with left lower lobe atelectasis. No real change. Electronically Signed   By: Rolm Baptise M.D.   On: 04/11/2021 10:21   NM PET Image Initial (PI) Skull Base To Thigh  Result Date: 04/13/2021 CLINICAL DATA:  Initial treatment strategy for suspected malignant pleural effusion. EXAM: NUCLEAR MEDICINE PET SKULL BASE TO THIGH TECHNIQUE: 5.9 mCi F-18 FDG was injected intravenously. Full-ring PET imaging was performed from the skull base to thigh after the radiotracer. CT data was obtained and used for attenuation correction and anatomic localization. Fasting blood glucose: 102 mg/dl COMPARISON:  Chest CT 03/11/2021 FINDINGS: Mediastinal blood pool activity: SUV max 2.3 Liver activity: SUV max NA NECK: Activity along the anterior tongue with maximum SUV of 7.6, no visible mass on the CT data, likely physiologic. Incidental CT findings: Mildly hyperdense left thyroid nodule 1.4 cm in long axis on image 36 series 4. This lesion is not hypermetabolic relative to the rest of the thyroid gland. Not clinically significant; no follow-up imaging recommended (ref: J Am Coll Radiol. 2015 Feb;12(2): 143-50). CHEST: Extensive hypermetabolic left pleural nodularity forming a substantial rind along much of the pleural surface. Posterior to the descending thoracic aorta this measures up to 1.8 cm in thickness (image 44, series 4) and the maximum SUV of the associated thick pleural rind is 22.9, compatible with malignancy. In addition to more rind like regions of pleural hypermetabolic activity there are multifocal solitary pleural nodules. There is evidence of old granulomatous disease with calcified right lower paratracheal and right hilar lymph nodes. Mild associated hypermetabolic activity noted at the right hilum with a small lymph node having a maximum SUV of 5.5. Small subcarinal lymph node with maximum SUV 3.5. A  right apical nodule with peripheral calcification measuring 1.3 by 0.6 cm on image 39 series 4 has a maximum SUV of 1.2, favoring benign etiology. Incidental CT findings: Coronary, aortic arch, and branch vessel atherosclerotic vascular disease. Ascending thoracic aortic aneurysm 4.3 cm in diameter on image 65 series 4. ABDOMEN/PELVIS: Hypermetabolic 5.0 by 3.3 cm exophytic mass of the left kidney lower pole on image 112 series 4, maximum SUV approximately 17.4, compatible with malignancy. Photopenic cyst of the left kidney upper pole, and anteriorly of the left mid kidney. Incidental CT findings: Cholelithiasis. Aortoiliac atherosclerotic vascular disease. Descending and sigmoid colon diverticulosis. Suspected pelvic floor laxity. SKELETON: No significant abnormal hypermetabolic activity in this region. Incidental CT findings: Bony demineralization. IMPRESSION: 1. Extensive hypermetabolic pleural tumor forming a rind like regions along the left hemithorax, and with additional multifocal pleural nodules compatible with active malignancy. 2. 5.0 cm exophytic solid mass from the left kidney lower pole is highly hypermetabolic, favoring renal cell carcinoma or less likely a metastatic lesion to the kidney.  3. Mildly hypermetabolic right hilar and subcarinal adenopathy may also represent metastatic involvement. 4. Ascending thoracic aortic aneurysm 4.3 cm in diameter. Recommend annual imaging followup by CTA or MRA. This recommendation follows 2010 ACCF/AHA/AATS/ACR/ASA/SCA/SCAI/SIR/STS/SVM Guidelines for the Diagnosis and Management of Patients with Thoracic Aortic Disease. Circulation. 2010; 121: S970-Y637. Aortic aneurysm NOS (ICD10-I71.9) 5. Other imaging findings of potential clinical significance: Aortic Atherosclerosis (ICD10-I70.0). Coronary atherosclerosis. Photopenic renal cysts. Cholelithiasis. Descending and sigmoid colon diverticulosis. Pelvic floor laxity. Bony demineralization. Electronically Signed   By:  Van Clines M.D.   On: 04/13/2021 13:46   CT BIOPSY  Result Date: 04/24/2021 INDICATION: Metastatic malignancy. FDG avid left pleural mass. EXAM: CT-guided biopsy of FDG avid left pleural mass MEDICATIONS: None. ANESTHESIA/SEDATION: Moderate (conscious) sedation was employed during this procedure. A total of Versed 2 mg and Fentanyl 50 mcg was administered intravenously. Moderate Sedation Time: 13 minutes. The patient's level of consciousness and vital signs were monitored continuously by radiology nursing throughout the procedure under my direct supervision. COMPLICATIONS: None immediate. PROCEDURE: Informed written consent was obtained from the patient after a thorough discussion of the procedural risks, benefits and alternatives. All questions were addressed. Maximal Sterile Barrier Technique was utilized including caps, mask, sterile gowns, sterile gloves, sterile drape, hand hygiene and skin antiseptic. A timeout was performed prior to the initiation of the procedure. Patient position prone on the CT table. Left posterior chest wall skin prepped and draped in usual sterile fashion. Following local lidocaine administration, 17 gauge introducer needle was advanced into the left pleural mass utilizing CT guidance. Four cores were obtained from the left pleural mass and sent to pathology in formalin. Needle removed and hemostasis achieved with manual compression. IMPRESSION: CT-guided biopsy of FDG avid left pleural mass as above. Electronically Signed   By: Miachel Roux M.D.   On: 04/24/2021 15:50   DG Chest Port 1 View  Result Date: 04/24/2021 CLINICAL DATA:  Post lung biopsy EXAM: PORTABLE CHEST 1 VIEW COMPARISON:  04/12/2021 FINDINGS: Stable cardiomediastinal contours. Atherosclerotic calcification of the aortic knob. Irregular masslike pleural thickening on the left. Lungs are otherwise clear. No pneumothorax. IMPRESSION: No pneumothorax status post left lung biopsy. Electronically Signed   By:  Davina Poke D.O.   On: 04/24/2021 14:30   DG CHEST PORT 1 VIEW  Result Date: 04/12/2021 CLINICAL DATA:  Left thoracentesis EXAM: PORTABLE CHEST 1 VIEW COMPARISON:  04/11/2021 FINDINGS: Single frontal view of the chest demonstrates near complete resolution of the left pleural effusion after interval left-sided thoracentesis. Minimal consolidation within the left lower lobe. Chronic areas of left pleural thickening again noted. No evidence pneumothorax. The right chest is clear. No acute bony abnormalities. IMPRESSION: 1. Near complete resolution of left pleural effusion after thoracentesis. No evidence pneumothorax. 2. Residual left basilar consolidation and chronic left pleural thickening as above. Electronically Signed   By: Randa Ngo M.D.   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   PLAN: I had a lengthy discussion with the patient and her family today about her current disease stage, prognosis and treatment options. I personally and independently reviewed the scan images and discussed the result and showed the images to the patient and her family. The final pathology is highly  suspicious for renal cell carcinoma, papillary subtype but other malignancy like non-small cell lung cancer could not be completely excluded. I recommended for the patient to complete the staging work-up by ordering MRI of the brain to rule out brain metastasis. I will also [facility] the tissue block [facility] for molecular studies and PD-L1 expression. Based on the recent pathology I discussed with the patient and her family her treatment options and she was giving the option of palliative care and  hospice referral versus consideration of palliative treatment with immunotherapy with ipilimumab 1 mg/KG in addition to nivolumab 3 mg/KG every 3 weeks for 4 cycles followed by maintenance nivolumab if she has no evidence for disease progression. The patient and her family are interested in the treatment and she is expected to start the first dose of this treatment on May 22, 2021. I discussed with him the adverse effect of this treatment including but not limited to immunotherapy mediated skin rash, diarrhea, inflammation of the lung, kidney, liver, thyroid or other endocrine dysfunction. The patient will come back for follow-up visit in 1 week after the start of the treatment for management of any adverse effect of her treatment. For the recurrent left pleural effusion, I may consider The patient for ultrasound-guided thoracentesis if she becomes more symptomatic with reaccumulation of the fluid. She was advised to call immediately if she has any other concerning symptoms in the interval.  The patient voices understanding of current disease status and treatment options and is in agreement with the current care plan.  All questions were answered. The patient knows to call the clinic with any problems, questions or concerns. We can certainly see the patient much sooner if necessary.  Thank you so much for allowing me to participate in the care of Seneca Gadbois. I will continue to follow up the patient with you and assist in her care.  The total time spent in the appointment was 100 minutes.  Disclaimer: This note was dictated with voice recognition software. Similar sounding words can inadvertently be transcribed and may not be corrected upon review.   Eilleen Kempf May 09, 2021, 12:06 PM

## 2021-05-15 NOTE — Progress Notes (Signed)
Pharmacist Chemotherapy Monitoring - Initial Assessment    Anticipated start date: 05/22/21   The following has been reviewed per standard work regarding the patient's treatment regimen: The patient's diagnosis, treatment plan and drug doses, and organ/hematologic function Lab orders and baseline tests specific to treatment regimen  The treatment plan start date, drug sequencing, and pre-medications Prior authorization status  Patient's documented medication list, including drug-drug interaction screen and prescriptions for anti-emetics and supportive care specific to the treatment regimen The drug concentrations, fluid compatibility, administration routes, and timing of the medications to be used The patient's access for treatment and lifetime cumulative dose history, if applicable  The patient's medication allergies and previous infusion related reactions, if applicable   Changes made to treatment plan:  N/A  Follow up needed:  Pending authorization for treatment   Benn Moulder, PharmD Pharmacy Resident  05/15/2021 11:35 AM

## 2021-05-16 ENCOUNTER — Other Ambulatory Visit: Payer: Self-pay | Admitting: Internal Medicine

## 2021-05-16 DIAGNOSIS — C3490 Malignant neoplasm of unspecified part of unspecified bronchus or lung: Secondary | ICD-10-CM

## 2021-05-17 ENCOUNTER — Other Ambulatory Visit: Payer: Self-pay

## 2021-05-17 ENCOUNTER — Encounter: Payer: Self-pay | Admitting: Internal Medicine

## 2021-05-17 ENCOUNTER — Inpatient Hospital Stay: Payer: Medicaid Other | Attending: Internal Medicine

## 2021-05-17 DIAGNOSIS — Z5112 Encounter for antineoplastic immunotherapy: Secondary | ICD-10-CM | POA: Insufficient documentation

## 2021-05-17 DIAGNOSIS — C801 Malignant (primary) neoplasm, unspecified: Secondary | ICD-10-CM | POA: Insufficient documentation

## 2021-05-17 DIAGNOSIS — Z79899 Other long term (current) drug therapy: Secondary | ICD-10-CM | POA: Insufficient documentation

## 2021-05-17 DIAGNOSIS — C771 Secondary and unspecified malignant neoplasm of intrathoracic lymph nodes: Secondary | ICD-10-CM | POA: Insufficient documentation

## 2021-05-17 DIAGNOSIS — C782 Secondary malignant neoplasm of pleura: Secondary | ICD-10-CM | POA: Insufficient documentation

## 2021-05-17 LAB — FUNGAL ORGANISM REFLEX

## 2021-05-17 LAB — FUNGUS CULTURE WITH STAIN

## 2021-05-17 LAB — FUNGUS CULTURE RESULT

## 2021-05-17 NOTE — Progress Notes (Signed)
Met with patient/accompanying adult and interpreter at registration to introduce myself as Arboriculturist and to offer available resources.  Discussed one-time $1000 Radio broadcast assistant to assist with personal expenses while going through treatment. Advised what is needed to apply.  Gave them my card if interested in applying and for any additional financial questions or concerns.

## 2021-05-21 ENCOUNTER — Encounter (HOSPITAL_COMMUNITY): Payer: Self-pay | Admitting: Internal Medicine

## 2021-05-22 ENCOUNTER — Other Ambulatory Visit: Payer: Self-pay | Admitting: Internal Medicine

## 2021-05-22 ENCOUNTER — Inpatient Hospital Stay: Payer: Medicaid Other

## 2021-05-22 ENCOUNTER — Telehealth: Payer: Self-pay

## 2021-05-22 ENCOUNTER — Other Ambulatory Visit: Payer: Self-pay

## 2021-05-22 VITALS — BP 177/74 | HR 64 | Temp 98.4°F | Resp 18

## 2021-05-22 DIAGNOSIS — C801 Malignant (primary) neoplasm, unspecified: Secondary | ICD-10-CM | POA: Diagnosis not present

## 2021-05-22 DIAGNOSIS — C642 Malignant neoplasm of left kidney, except renal pelvis: Secondary | ICD-10-CM

## 2021-05-22 DIAGNOSIS — Z5112 Encounter for antineoplastic immunotherapy: Secondary | ICD-10-CM | POA: Diagnosis present

## 2021-05-22 DIAGNOSIS — C782 Secondary malignant neoplasm of pleura: Secondary | ICD-10-CM | POA: Diagnosis not present

## 2021-05-22 DIAGNOSIS — Z79899 Other long term (current) drug therapy: Secondary | ICD-10-CM | POA: Diagnosis not present

## 2021-05-22 DIAGNOSIS — C771 Secondary and unspecified malignant neoplasm of intrathoracic lymph nodes: Secondary | ICD-10-CM | POA: Diagnosis not present

## 2021-05-22 LAB — CMP (CANCER CENTER ONLY)
ALT: <6 U/L (ref 0–44)
AST: 11 U/L — ABNORMAL LOW (ref 15–41)
Albumin: 3.5 g/dL (ref 3.5–5.0)
Alkaline Phosphatase: 90 U/L (ref 38–126)
Anion gap: 12 (ref 5–15)
BUN: 13 mg/dL (ref 8–23)
CO2: 22 mmol/L (ref 22–32)
Calcium: 9.4 mg/dL (ref 8.9–10.3)
Chloride: 103 mmol/L (ref 98–111)
Creatinine: 0.75 mg/dL (ref 0.44–1.00)
GFR, Estimated: >60 mL/min (ref >60)
Glucose, Bld: 126 mg/dL — ABNORMAL HIGH (ref 70–99)
Potassium: 4.1 mmol/L (ref 3.5–5.1)
Sodium: 137 mmol/L (ref 135–145)
Total Bilirubin: 0.5 mg/dL (ref 0.3–1.2)
Total Protein: 6.9 g/dL (ref 6.5–8.1)

## 2021-05-22 LAB — CBC WITH DIFFERENTIAL (CANCER CENTER ONLY)
Abs Immature Granulocytes: 0.04 10*3/uL (ref 0.00–0.07)
Basophils Absolute: 0.1 10*3/uL (ref 0.0–0.1)
Basophils Relative: 0 %
Eosinophils Absolute: 0.1 10*3/uL (ref 0.0–0.5)
Eosinophils Relative: 1 %
HCT: 33.5 % — ABNORMAL LOW (ref 36.0–46.0)
Hemoglobin: 11.1 g/dL — ABNORMAL LOW (ref 12.0–15.0)
Immature Granulocytes: 0 %
Lymphocytes Relative: 15 %
Lymphs Abs: 1.7 10*3/uL (ref 0.7–4.0)
MCH: 28.5 pg (ref 26.0–34.0)
MCHC: 33.1 g/dL (ref 30.0–36.0)
MCV: 86.1 fL (ref 80.0–100.0)
Monocytes Absolute: 1.2 10*3/uL — ABNORMAL HIGH (ref 0.1–1.0)
Monocytes Relative: 11 %
Neutro Abs: 8.1 10*3/uL — ABNORMAL HIGH (ref 1.7–7.7)
Neutrophils Relative %: 73 %
Platelet Count: 221 10*3/uL (ref 150–400)
RBC: 3.89 MIL/uL (ref 3.87–5.11)
RDW: 13.6 % (ref 11.5–15.5)
WBC Count: 11.2 10*3/uL — ABNORMAL HIGH (ref 4.0–10.5)
nRBC: 0 % (ref 0.0–0.2)

## 2021-05-22 LAB — TSH: TSH: 1.909 u[IU]/mL (ref 0.350–4.500)

## 2021-05-22 MED ORDER — SODIUM CHLORIDE 0.9 % IV SOLN: Freq: Once | INTRAVENOUS | Status: AC

## 2021-05-22 MED ORDER — FAMOTIDINE 20 MG IN NS 100 ML IVPB
20.0000 mg | Freq: Once | INTRAVENOUS | Status: AC
  Administered 2021-05-22: 20 mg via INTRAVENOUS
  Filled 2021-05-22: qty 100

## 2021-05-22 MED ORDER — LORAZEPAM 0.5 MG PO TABS: ORAL_TABLET | ORAL | 0 refills | Status: DC

## 2021-05-22 MED ORDER — DIPHENHYDRAMINE HCL 50 MG/ML IJ SOLN
25.0000 mg | Freq: Once | INTRAMUSCULAR | Status: AC
  Administered 2021-05-22: 25 mg via INTRAVENOUS
  Filled 2021-05-22: qty 1

## 2021-05-22 MED ORDER — SODIUM CHLORIDE 0.9 % IV SOLN
1.0000 mg/kg | Freq: Once | INTRAVENOUS | Status: AC
  Administered 2021-05-22: 50 mg via INTRAVENOUS
  Filled 2021-05-22: qty 10

## 2021-05-22 MED ORDER — IPILIMUMAB CHEMO INJECTION 200 MG/40ML
1.0000 mg/kg | Freq: Once | INTRAVENOUS | Status: DC
  Filled 2021-05-22: qty 10

## 2021-05-22 MED ORDER — SODIUM CHLORIDE 0.9 % IV SOLN
3.1000 mg/kg | Freq: Once | INTRAVENOUS | Status: AC
  Administered 2021-05-22: 160 mg via INTRAVENOUS
  Filled 2021-05-22: qty 16

## 2021-05-22 NOTE — Telephone Encounter (Signed)
Pt caregiver LM request a rx for Ativan for pts upcoming MRI.

## 2021-05-22 NOTE — Patient Instructions (Signed)
[facility]   Discharge Instructions: Thank you for choosing [facility] to provide your oncology and hematology care.   If you have a lab appointment with the [facility], please go directly to the [facility] and check in at the registration area.   Wear comfortable clothing and clothing appropriate for easy access to any Portacath or PICC line.   We strive to give you quality time with your provider. You may need to reschedule your appointment if you arrive late (15 or more minutes).  Arriving late affects you and other patients whose appointments are after yours.  Also, if you miss three or more appointments without notifying the office, you may be dismissed from the clinic at the provider's discretion.      For prescription refill requests, have your pharmacy contact our office and allow 72 hours for refills to be completed.    Today you received the following chemotherapy and/or immunotherapy agents: Nivolumab (Opdivo) and Ipilimumab (Yervoy).      To help prevent nausea and vomiting after your treatment, we encourage you to take your nausea medication as directed.  BELOW ARE SYMPTOMS THAT SHOULD BE REPORTED IMMEDIATELY: *FEVER GREATER THAN 100.4 F (38 C) OR HIGHER *CHILLS OR SWEATING *NAUSEA AND VOMITING THAT IS NOT CONTROLLED WITH YOUR NAUSEA MEDICATION *UNUSUAL SHORTNESS OF BREATH *UNUSUAL BRUISING OR BLEEDING *URINARY PROBLEMS (pain or burning when urinating, or frequent urination) *BOWEL PROBLEMS (unusual diarrhea, constipation, pain near the anus) TENDERNESS IN MOUTH AND THROAT WITH OR WITHOUT PRESENCE OF ULCERS (sore throat, sores in mouth, or a toothache) UNUSUAL RASH, SWELLING OR PAIN  UNUSUAL VAGINAL DISCHARGE OR ITCHING   Items with * indicate a potential emergency and should be followed up as soon as possible or go to the Emergency Department if any problems should occur.  Please show the CHEMOTHERAPY ALERT CARD or  IMMUNOTHERAPY ALERT CARD at check-in to the Emergency Department and triage nurse.  Should you have questions after your visit or need to cancel or reschedule your appointment, please contact [facility]  Dept: [phone]  and follow the prompts.  Office hours are 8:00 a.m. to 4:30 p.m. Monday - Friday. Please note that voicemails left after 4:00 p.m. may not be returned until the following business day.  We are closed weekends and major holidays. You have access to a nurse at all times for urgent questions. Please call the main number to the clinic Dept: [phone] and follow the prompts.   For any non-urgent questions, you may also contact your provider using MyChart. We now offer e-Visits for anyone 21 and older to request care online for non-urgent symptoms. For details visit [email or web address].   Also download the MyChart app! Go to the app store, search "MyChart", open the app, select [facility], and log in with your MyChart username and password.  Due to Covid, a mask is required upon entering the hospital/clinic. If you do not have a mask, one will be given to you upon arrival. For doctor visits, patients may have 1 support person aged 89 or older with them. For treatment visits, patients cannot have anyone with them due to current Covid guidelines and our immunocompromised population.

## 2021-05-22 NOTE — Progress Notes (Signed)
Ipilimumab (YERVOY) Patient Monitoring Assessment   Is the patient experiencing any of the following general symptoms?:  [x] Difficulty performing normal activities (weakness, "heaviness" in back) [] Feeling sluggish or cold all the time [] Unusual weight gain [] Constant or unusual headaches (only with high BP) [] Feeling dizzy or faint [] Changes in eyesight (blurry vision, double vision, or other vision problems) (pt wears glasses at baseline) [] Changes in mood or behavior (ex: decreased sex drive, irritability, or forgetfulness) [] Starting new medications (ex: steroids, other medications that lower immune response) [] Patient is not experiencing any of the general symptoms above.    Gastrointestinal  Patient is having 1 bowel movements each day.  Is this different from baseline? [] Yes [] No Are your stools watery or do they have a foul smell? [] Yes [] No Have you seen blood in your stools? [] Yes [] No Are your stools dark, tarry, or sticky? [] Yes [] No Are you having pain or tenderness in your belly? [] Yes [] No  Skin Does your skin itch? [] Yes [] No Do you have a rash? [] Yes [] No Has your skin blistered and/or peeled? [] Yes [] No Do you have sores in your mouth? [] Yes [] No  Hepatic Has your urine been dark or tea colored? [] Yes [] No Have you noticed that your skin or the whites of your eyes are turning yellow? [] Yes [] No Are you bleeding or bruising more easily than normal? [] Yes [] No Are you nauseous and/or vomiting? [] Yes [] No Do you have pain on the right side of your stomach? [] Yes [] No  Neurologic  Are you having unusual weakness of legs, arms, or face? [] Yes [] No Are you having numbness or tingling in your hands or feet? [] Yes [] No  Gail Fitzgerald

## 2021-05-23 ENCOUNTER — Telehealth: Payer: Self-pay | Admitting: Medical Oncology

## 2021-05-23 ENCOUNTER — Encounter (HOSPITAL_COMMUNITY): Payer: Self-pay | Admitting: Internal Medicine

## 2021-05-23 ENCOUNTER — Encounter (HOSPITAL_COMMUNITY): Payer: Self-pay

## 2021-05-23 NOTE — Telephone Encounter (Signed)
Reviewed dose instructions for Ativan prior to scan.

## 2021-05-25 ENCOUNTER — Ambulatory Visit (HOSPITAL_COMMUNITY)
Admission: RE | Admit: 2021-05-25 | Discharge: 2021-05-25 | Disposition: A | Discharge status: Home / Self Care | Payer: Medicaid Other | Source: Ambulatory Visit | Attending: Internal Medicine | Admitting: Internal Medicine

## 2021-05-25 DIAGNOSIS — C3490 Malignant neoplasm of unspecified part of unspecified bronchus or lung: Secondary | ICD-10-CM | POA: Diagnosis not present

## 2021-05-25 MED ORDER — GADOBUTROL 1 MMOL/ML IV SOLN
5.0000 mL | Freq: Once | INTRAVENOUS | Status: AC | PRN
  Administered 2021-05-25: 5 mL via INTRAVENOUS

## 2021-05-29 ENCOUNTER — Inpatient Hospital Stay: Payer: Medicaid Other | Admitting: Internal Medicine

## 2021-05-29 ENCOUNTER — Other Ambulatory Visit: Payer: Self-pay

## 2021-05-29 VITALS — BP 155/72 | HR 71 | Temp 96.6°F | Resp 17 | Wt 114.7 lb

## 2021-05-29 DIAGNOSIS — Z5112 Encounter for antineoplastic immunotherapy: Secondary | ICD-10-CM

## 2021-05-29 DIAGNOSIS — C642 Malignant neoplasm of left kidney, except renal pelvis: Secondary | ICD-10-CM

## 2021-05-29 NOTE — Progress Notes (Signed)
[facility] Telephone:([phone]   [phone]  OFFICE PROGRESS NOTE  [name], MD [place] [address] [place] [address]  DIAGNOSIS: Metastatic renal cell carcinoma presented with large left renal mass in addition to significant left hemothorax pleural-based metastasis as well as left hilar and mediastinal lymphadenopathy diagnosed in August 2022.  Biomarker Findings Microsatellite status - MS-Stable Tumor Mutational Burden - 2 Muts/Mb Genomic Findings For a complete list of the genes assayed, please refer to the Appendix. MTAP loss CDKN2A/B CDKN2A loss 8 Disease relevant genes with no reportable Alteratio  PD-L1 expression 0%  PRIOR THERAPY: None  CURRENT THERAPY: Systemic immunotherapy with ipilimumab 1 mg/KG in addition to nivolumab 3 mg/KG every 3 weeks for 4 cycles followed by maintenance treatment with nivolumab.  Status post 1 cycle.  First dose started on 05/22/2021.  INTERVAL HISTORY: Gail Fitzgerald 86 y.o. female returns to the clinic today for follow-up visit accompanied by her daughter-in-law and her [place] interpreter.  The patient is feeling fine today with no concerning complaints except for the pain on the left side of the chest with radiation to the back.  She also has intermittent pain on the right side.  She has a history of gallbladder stones in the past.  She also has occasional nausea.  She denied having any shortness of breath, cough or hemoptysis.  She denied having any fever or chills.  She has no vomiting, abdominal pain, diarrhea or constipation.  She lost few pounds since her last visit.  The patient had several studies performed recently including molecular studies that showed no actionable mutation and her PD-L1 expression was 0.  She also had MRI of the brain that was negative for metastatic disease to the brain.  She is here today for evaluation and repeat blood work.  MEDICAL HISTORY: Past Medical  History:  Diagnosis Date   Hypertension     ALLERGIES:  has No Known Allergies.  MEDICATIONS:  Current Outpatient Medications  Medication Sig Dispense Refill   acetaminophen (TYLENOL) 500 MG tablet Take 500 mg by mouth every 6 (six) hours as needed for moderate pain.     aspirin EC 81 MG tablet Take 81 mg by mouth every evening. Swallow whole.     benazepril (LOTENSIN) 10 MG tablet Take 10 mg by mouth 2 (two) times daily.     ibuprofen (ADVIL) 200 MG tablet Take 400 mg by mouth every 8 (eight) hours as needed (pain.).     LORazepam (ATIVAN) 0.5 MG tablet 1 tablet p.o. 30 minutes before the MRI.  May repeat once if needed. 2 tablet 0   Magnesium 400 MG CAPS Take 400 mg by mouth See admin instructions. Takes 1 tablet (400 mg) by mouth once daily 10 days on and 10 days off.     metoprolol succinate (TOPROL-XL) 100 MG 24 hr tablet Take 50 mg by mouth in the morning and at bedtime.     OVER THE COUNTER MEDICATION Take 1 tablet by mouth daily as needed (for stomach discomfort). Allochol - (Herbal Supplement) Multivitamin with : Magnesium, potassium, garlic and charcoal. (Russian Medication) (Patient not taking: Reported on 05/09/2021)     prochlorperazine (COMPAZINE) 10 MG tablet Take 1 tablet (10 mg total) by mouth every 6 (six) hours as needed for nausea or vomiting. 30 tablet 0   simvastatin (ZOCOR) 20 MG tablet Take 20 mg by mouth every evening.     UNABLE TO FIND Take 1 tablet  by mouth daily as needed (chest pain (angina)). Med Name: Validol (Menthyl isovalerate and menthol) (Russian Medication)     No current facility-administered medications for this visit.    SURGICAL HISTORY:  Past Surgical History:  Procedure Laterality Date   IR THORACENTESIS ASP PLEURAL SPACE W/IMG GUIDE  04/05/2021   THORACENTESIS N/A 04/12/2021   Procedure: Mathews Robinsons;  Surgeon: [name], DO;  Location: [facility];  Service: Pulmonary;  Laterality: N/A;    REVIEW OF SYSTEMS:  Constitutional: positive  for fatigue and weight loss Eyes: negative Ears, nose, mouth, throat, and face: negative Respiratory: positive for pleurisy/chest pain Cardiovascular: negative Gastrointestinal: positive for nausea Genitourinary:negative Integument/breast: negative Hematologic/lymphatic: negative Musculoskeletal:negative Neurological: negative Behavioral/Psych: negative Endocrine: negative Allergic/Immunologic: negative   PHYSICAL EXAMINATION: General appearance: alert, cooperative, fatigued, and no distress Head: Normocephalic, without obvious abnormality, atraumatic Neck: no adenopathy, no JVD, supple, symmetrical, trachea midline, and thyroid not enlarged, symmetric, no tenderness/mass/nodules Lymph nodes: Cervical, supraclavicular, and axillary nodes normal. Resp: diminished breath sounds LLL and dullness to percussion LLL Back: symmetric, no curvature. ROM normal. No CVA tenderness. Cardio: regular rate and rhythm, S1, S2 normal, no murmur, click, rub or gallop GI: soft, non-tender; bowel sounds normal; no masses,  no organomegaly Extremities: extremities normal, atraumatic, no cyanosis or edema Neurologic: Alert and oriented X 3, normal strength and tone. Normal symmetric reflexes. Normal coordination and gait  ECOG PERFORMANCE STATUS: 1 - Symptomatic but completely ambulatory  Blood pressure (!) 155/72, pulse 71, temperature (!) 96.6 F (35.9 C), temperature source Tympanic, resp. rate 17, weight 114 lb 11.2 oz (52 kg), SpO2 97 %.  LABORATORY DATA: Lab Results  Component Value Date   WBC 11.2 (H) 05/22/2021   HGB 11.1 (L) 05/22/2021   HCT 33.5 (L) 05/22/2021   MCV 86.1 05/22/2021   PLT 221 05/22/2021      Chemistry      Component Value Date/Time   NA 137 05/22/2021 0859   K 4.1 05/22/2021 0859   CL 103 05/22/2021 0859   CO2 22 05/22/2021 0859   BUN 13 05/22/2021 0859   CREATININE 0.75 05/22/2021 0859      Component Value Date/Time   CALCIUM 9.4 05/22/2021 0859   ALKPHOS 90  05/22/2021 0859   AST 11 (L) 05/22/2021 0859   ALT <6 05/22/2021 0859   BILITOT 0.5 05/22/2021 0859       RADIOGRAPHIC STUDIES: MR Brain W Wo Contrast  Result Date: 05/27/2021 CLINICAL DATA:  86 year old female with pleural tumor and left renal mass diagnosed last month. 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 Staging. EXAM: MRI HEAD WITHOUT AND WITH CONTRAST TECHNIQUE: Multiplanar, multiecho pulse sequences of the brain and surrounding structures were obtained without and with intravenous contrast. CONTRAST:  39m GADAVIST GADOBUTROL 1 MMOL/ML IV SOLN COMPARISON:  PET-CT 04/13/2021. FINDINGS: Brain: No restricted diffusion to suggest acute infarction. No midline shift, mass effect, evidence of mass lesion, ventriculomegaly, extra-axial collection or acute intracranial hemorrhage. Cervicomedullary junction and pituitary are within normal limits. No abnormal enhancement identified. No dural thickening identified. Mild for age patchy and scattered bilateral cerebral white matter T2 and FLAIR hyperintensity. No cortical encephalomalacia. No chronic cerebral blood products. Deep gray matter nuclei, brainstem and cerebellum are within normal limits for age. Vascular: Major intracranial vascular flow voids are preserved. Tortuous left ICA siphon. The left vertebral artery appears dominant. The major dural venous sinuses are enhancing and appear to be patent. Skull and upper cervical spine: Negative for age visible cervical spine.  Visualized bone marrow signal is within normal limits. Sinuses/Orbits: Postoperative changes to both globes, otherwise negative orbits. Paranasal Visualized paranasal sinuses and mastoids are clear. Other: Visible internal auditory structures appear normal. Negative visible scalp and face. IMPRESSION: 1. No metastatic disease or acute intracranial abnormality. 2. Mild for age cerebral white matter signal changes, most commonly due to chronic small  vessel disease. Electronically Signed   By: Genevie Ann M.D.   On: 05/27/2021 07:41    ASSESSMENT AND PLAN: This is a an 86 years old white female who originally from [place] with metastatic renal cell carcinoma presented with large left renal mass in addition to left hemithorax pleural-based metastasis as well as left hilar and mediastinal lymphadenopathy diagnosed in 2022.  Her molecular studies showed no actionable mutations and PD-L1 expression was negative. The patient had MRI of the brain performed recently that showed no evidence of metastatic disease to the brain. She started systemic treatment with immunotherapy with ipilimumab 1 mg/KG as well as nivolumab 3 mg/KG every 3 weeks status post 1 cycle.  The patient tolerated the first cycle of her treatment well with no significant adverse effect except for mild nausea.  She continues to have pain on the left side of the chest from her neoplasm. I recommended for the patient to continue her current treatment and she is expected to start cycle #2 in 2 weeks. For the pain management, I will start the patient on Percocet 5/325 mg p.o. every 8 hours as needed for pain.  She was also advised to take Tylenol if needed.  She was asked to avoid any ibuprofen. For the nausea she will continue on Compazine and I will also give her prescription for Protonix for the GERD symptoms. The patient has a lot of questions and I spent a lot of time with her today answering her questions through the [place] interpreter. She was advised to call immediately if she has any other concerning symptoms in the interval. The patient voices understanding of current disease status and treatment options and is in agreement with the current care plan.  All questions were answered. The patient knows to call the clinic with any problems, questions or concerns. We can certainly see the patient much sooner if necessary.  The total time spent in the appointment was 55  minutes.  Disclaimer: This note was dictated with voice recognition software. Similar sounding words can inadvertently be transcribed and may not be corrected upon review.

## 2021-06-01 ENCOUNTER — Encounter: Payer: Self-pay | Admitting: Internal Medicine

## 2021-06-01 ENCOUNTER — Telehealth: Payer: Self-pay | Admitting: Medical Oncology

## 2021-06-01 ENCOUNTER — Other Ambulatory Visit: Payer: Self-pay | Admitting: Internal Medicine

## 2021-06-01 MED ORDER — PANTOPRAZOLE SODIUM 40 MG PO TBEC: 40.0000 mg | DELAYED_RELEASE_TABLET | Freq: Every day | ORAL | 1 refills | Status: DC

## 2021-06-01 MED ORDER — OXYCODONE-ACETAMINOPHEN 5-325 MG PO TABS: 1.0000 | ORAL_TABLET | Freq: Three times a day (TID) | ORAL | 0 refills | Status: DC | PRN

## 2021-06-01 NOTE — Progress Notes (Signed)
Received letter of support in mailbox for [employer].  Will meet w/ patient at next visit to complete grant process.

## 2021-06-01 NOTE — Telephone Encounter (Addendum)
Medications requests for pain and Acid reflux.

## 2021-06-12 ENCOUNTER — Other Ambulatory Visit: Payer: Self-pay | Admitting: Internal Medicine

## 2021-06-12 ENCOUNTER — Inpatient Hospital Stay: Payer: Medicaid Other | Attending: Internal Medicine

## 2021-06-12 ENCOUNTER — Other Ambulatory Visit: Payer: Self-pay

## 2021-06-12 ENCOUNTER — Inpatient Hospital Stay: Payer: Medicaid Other | Admitting: Internal Medicine

## 2021-06-12 ENCOUNTER — Inpatient Hospital Stay: Payer: Medicaid Other

## 2021-06-12 ENCOUNTER — Ambulatory Visit: Payer: Medicaid Other | Admitting: Dietician

## 2021-06-12 ENCOUNTER — Encounter: Payer: Self-pay | Admitting: Internal Medicine

## 2021-06-12 VITALS — BP 182/82 | HR 71 | Temp 97.6°F | Resp 18 | Ht 61.0 in | Wt 113.3 lb

## 2021-06-12 DIAGNOSIS — C642 Malignant neoplasm of left kidney, except renal pelvis: Secondary | ICD-10-CM | POA: Insufficient documentation

## 2021-06-12 DIAGNOSIS — Z5112 Encounter for antineoplastic immunotherapy: Secondary | ICD-10-CM | POA: Insufficient documentation

## 2021-06-12 DIAGNOSIS — C782 Secondary malignant neoplasm of pleura: Secondary | ICD-10-CM | POA: Diagnosis not present

## 2021-06-12 DIAGNOSIS — C771 Secondary and unspecified malignant neoplasm of intrathoracic lymph nodes: Secondary | ICD-10-CM | POA: Diagnosis not present

## 2021-06-12 DIAGNOSIS — Z79899 Other long term (current) drug therapy: Secondary | ICD-10-CM | POA: Insufficient documentation

## 2021-06-12 LAB — CBC WITH DIFFERENTIAL (CANCER CENTER ONLY)
Abs Immature Granulocytes: 0.04 10*3/uL (ref 0.00–0.07)
Basophils Absolute: 0.1 10*3/uL (ref 0.0–0.1)
Basophils Relative: 1 %
Eosinophils Absolute: 0.1 10*3/uL (ref 0.0–0.5)
Eosinophils Relative: 1 %
HCT: 34.1 % — ABNORMAL LOW (ref 36.0–46.0)
Hemoglobin: 11 g/dL — ABNORMAL LOW (ref 12.0–15.0)
Immature Granulocytes: 0 %
Lymphocytes Relative: 14 %
Lymphs Abs: 1.7 10*3/uL (ref 0.7–4.0)
MCH: 27.6 pg (ref 26.0–34.0)
MCHC: 32.3 g/dL (ref 30.0–36.0)
MCV: 85.5 fL (ref 80.0–100.0)
Monocytes Absolute: 1.7 10*3/uL — ABNORMAL HIGH (ref 0.1–1.0)
Monocytes Relative: 13 %
Neutro Abs: 9.1 10*3/uL — ABNORMAL HIGH (ref 1.7–7.7)
Neutrophils Relative %: 71 %
Platelet Count: 238 10*3/uL (ref 150–400)
RBC: 3.99 MIL/uL (ref 3.87–5.11)
RDW: 13.6 % (ref 11.5–15.5)
WBC Count: 12.7 10*3/uL — ABNORMAL HIGH (ref 4.0–10.5)
nRBC: 0 % (ref 0.0–0.2)

## 2021-06-12 LAB — CMP (CANCER CENTER ONLY)
ALT: <5 U/L (ref 0–44)
AST: 11 U/L — ABNORMAL LOW (ref 15–41)
Albumin: 3.5 g/dL (ref 3.5–5.0)
Alkaline Phosphatase: 86 U/L (ref 38–126)
Anion gap: 11 (ref 5–15)
BUN: 13 mg/dL (ref 8–23)
CO2: 21 mmol/L — ABNORMAL LOW (ref 22–32)
Calcium: 9.4 mg/dL (ref 8.9–10.3)
Chloride: 98 mmol/L (ref 98–111)
Creatinine: 0.74 mg/dL (ref 0.44–1.00)
GFR, Estimated: >60 mL/min (ref >60)
Glucose, Bld: 105 mg/dL — ABNORMAL HIGH (ref 70–99)
Potassium: 4.4 mmol/L (ref 3.5–5.1)
Sodium: 130 mmol/L — ABNORMAL LOW (ref 135–145)
Total Bilirubin: 0.5 mg/dL (ref 0.3–1.2)
Total Protein: 7.4 g/dL (ref 6.5–8.1)

## 2021-06-12 LAB — TSH
TSH: 1.031 u[IU]/mL (ref 0.308–3.960)
TSH: 1.047 u[IU]/mL (ref 0.308–3.960)

## 2021-06-12 MED ORDER — FAMOTIDINE 20 MG IN NS 100 ML IVPB
20.0000 mg | Freq: Once | INTRAVENOUS | Status: AC
  Administered 2021-06-12: 20 mg via INTRAVENOUS
  Filled 2021-06-12: qty 100

## 2021-06-12 MED ORDER — SODIUM CHLORIDE 0.9 % IV SOLN
3.1000 mg/kg | Freq: Once | INTRAVENOUS | Status: AC
Start: 2021-06-12 — End: 2021-06-12
  Administered 2021-06-12: 160 mg via INTRAVENOUS
  Filled 2021-06-12: qty 16

## 2021-06-12 MED ORDER — SODIUM CHLORIDE 0.9 % IV SOLN
1.0000 mg/kg | Freq: Once | INTRAVENOUS | Status: AC
  Administered 2021-06-12: 50 mg via INTRAVENOUS
  Filled 2021-06-12: qty 10

## 2021-06-12 MED ORDER — SODIUM CHLORIDE 0.9 % IV SOLN: Freq: Once | INTRAVENOUS | Status: AC

## 2021-06-12 MED ORDER — DIPHENHYDRAMINE HCL 50 MG/ML IJ SOLN
25.0000 mg | Freq: Once | INTRAMUSCULAR | Status: AC
  Administered 2021-06-12: 25 mg via INTRAVENOUS
  Filled 2021-06-12: qty 1

## 2021-06-12 NOTE — Progress Notes (Signed)
[facility] [phone]   Fax:(336) [phone]  OFFICE PROGRESS NOTE  [name], MD [place] [address] [place] [address]  DIAGNOSIS: Metastatic renal cell carcinoma presented with large left renal mass in addition to significant left hemothorax pleural-based metastasis as well as left hilar and mediastinal lymphadenopathy diagnosed in August 2022.  Biomarker Findings Microsatellite status - MS-Stable Tumor Mutational Burden - 2 Muts/Mb Genomic Findings For a complete list of the genes assayed, please refer to the Appendix. MTAP loss CDKN2A/B CDKN2A loss 8 Disease relevant genes with no reportable Alteratio  PD-L1 expression 0%  PRIOR THERAPY: None  CURRENT THERAPY: Systemic immunotherapy with ipilimumab 1 mg/KG in addition to nivolumab 3 mg/KG every 3 weeks for 4 cycles followed by maintenance treatment with nivolumab.  Status post 1 cycle.  First dose started on 05/22/2021.  INTERVAL HISTORY: Gail Fitzgerald 86 y.o. female returns to the clinic today for follow-up visit accompanied by her [place] interpreter.  The patient is feeling fine today with no concerning complaints.  The last 2 weeks has been much better.  She denied having any significant chest pain, shortness of breath, cough or hemoptysis.  She denied having any nausea, vomiting, diarrhea or constipation.  He has no headache or visual changes.  She did not have to take her pain medication with Percocet and use only Tylenol on as-needed basis.  She has no fever or chills.  The patient is here today for evaluation before starting cycle #2 of her treatment.  MEDICAL HISTORY: Past Medical History:  Diagnosis Date   Hypertension     ALLERGIES:  has No Known Allergies.  MEDICATIONS:  Current Outpatient Medications  Medication Sig Dispense Refill   aspirin EC 81 MG tablet Take 81 mg by mouth every evening. Swallow whole.     benazepril (LOTENSIN) 10 MG tablet Take 10 mg  by mouth 2 (two) times daily.     ibuprofen (ADVIL) 200 MG tablet Take 400 mg by mouth every 8 (eight) hours as needed (pain.).     LORazepam (ATIVAN) 0.5 MG tablet 1 tablet p.o. 30 minutes before the MRI.  May repeat once if needed. 2 tablet 0   Magnesium 400 MG CAPS Take 400 mg by mouth See admin instructions. Takes 1 tablet (400 mg) by mouth once daily 10 days on and 10 days off.     metoprolol succinate (TOPROL-XL) 100 MG 24 hr tablet Take 50 mg by mouth in the morning and at bedtime.     OVER THE COUNTER MEDICATION Take 1 tablet by mouth daily as needed (for stomach discomfort). Allochol - (Herbal Supplement) Multivitamin with : Magnesium, potassium, garlic and charcoal. (Russian Medication) (Patient not taking: Reported on 05/09/2021)     oxyCODONE-acetaminophen (PERCOCET/ROXICET) 5-325 MG tablet Take 1 tablet by mouth every 8 (eight) hours as needed for severe pain. 30 tablet 0   pantoprazole (PROTONIX) 40 MG tablet Take 1 tablet (40 mg total) by mouth daily. 30 tablet 1   prochlorperazine (COMPAZINE) 10 MG tablet Take 1 tablet (10 mg total) by mouth every 6 (six) hours as needed for nausea or vomiting. 30 tablet 0   simvastatin (ZOCOR) 20 MG tablet Take 20 mg by mouth every evening.     UNABLE TO FIND Take 1 tablet by mouth daily as needed (chest pain (angina)). Med Name: Validol (Menthyl isovalerate and menthol) (Russian Medication)     No current facility-administered medications for this visit.    SURGICAL  HISTORY:  Past Surgical History:  Procedure Laterality Date   IR THORACENTESIS ASP PLEURAL SPACE W/IMG GUIDE  04/05/2021   THORACENTESIS N/A 04/12/2021   Procedure: Mathews Robinsons;  Surgeon: [name], DO;  Location: [facility];  Service: Pulmonary;  Laterality: N/A;    REVIEW OF SYSTEMS:  A comprehensive review of systems was negative except for: Constitutional: positive for fatigue   PHYSICAL EXAMINATION: General appearance: alert, cooperative, fatigued, and no  distress Head: Normocephalic, without obvious abnormality, atraumatic Neck: no adenopathy, no JVD, supple, symmetrical, trachea midline, and thyroid not enlarged, symmetric, no tenderness/mass/nodules Lymph nodes: Cervical, supraclavicular, and axillary nodes normal. Resp: diminished breath sounds LLL and dullness to percussion LLL Back: symmetric, no curvature. ROM normal. No CVA tenderness. Cardio: regular rate and rhythm, S1, S2 normal, no murmur, click, rub or gallop GI: soft, non-tender; bowel sounds normal; no masses,  no organomegaly Extremities: extremities normal, atraumatic, no cyanosis or edema  ECOG PERFORMANCE STATUS: 1 - Symptomatic but completely ambulatory  Blood pressure (!) 182/82, pulse 71, temperature 97.6 F (36.4 C), temperature source Tympanic, resp. rate 18, height _0  (1.549 m), weight 113 lb 4.8 oz (51.4 kg), SpO2 99 %.  LABORATORY DATA: Lab Results  Component Value Date   WBC 12.7 (H) 06/12/2021   HGB 11.0 (L) 06/12/2021   HCT 34.1 (L) 06/12/2021   MCV 85.5 06/12/2021   PLT 238 06/12/2021      Chemistry      Component Value Date/Time   NA 137 05/22/2021 0859   K 4.1 05/22/2021 0859   CL 103 05/22/2021 0859   CO2 22 05/22/2021 0859   BUN 13 05/22/2021 0859   CREATININE 0.75 05/22/2021 0859      Component Value Date/Time   CALCIUM 9.4 05/22/2021 0859   ALKPHOS 90 05/22/2021 0859   AST 11 (L) 05/22/2021 0859   ALT <6 05/22/2021 0859   BILITOT 0.5 05/22/2021 0859       RADIOGRAPHIC STUDIES: MR Brain W Wo Contrast  Result Date: 05/27/2021 CLINICAL DATA:  86 year old female with pleural tumor and left renal mass diagnosed last month. 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 Staging. EXAM: MRI HEAD WITHOUT AND WITH CONTRAST TECHNIQUE: Multiplanar, multiecho pulse sequences of the brain and surrounding structures were obtained without and with intravenous contrast. CONTRAST:  98m GADAVIST GADOBUTROL 1 MMOL/ML  IV SOLN COMPARISON:  PET-CT 04/13/2021. FINDINGS: Brain: No restricted diffusion to suggest acute infarction. No midline shift, mass effect, evidence of mass lesion, ventriculomegaly, extra-axial collection or acute intracranial hemorrhage. Cervicomedullary junction and pituitary are within normal limits. No abnormal enhancement identified. No dural thickening identified. Mild for age patchy and scattered bilateral cerebral white matter T2 and FLAIR hyperintensity. No cortical encephalomalacia. No chronic cerebral blood products. Deep gray matter nuclei, brainstem and cerebellum are within normal limits for age. Vascular: Major intracranial vascular flow voids are preserved. Tortuous left ICA siphon. The left vertebral artery appears dominant. The major dural venous sinuses are enhancing and appear to be patent. Skull and upper cervical spine: Negative for age visible cervical spine. Visualized bone marrow signal is within normal limits. Sinuses/Orbits: Postoperative changes to both globes, otherwise negative orbits. Paranasal Visualized paranasal sinuses and mastoids are clear. Other: Visible internal auditory structures appear normal. Negative visible scalp and face. IMPRESSION: 1. No metastatic disease or acute intracranial abnormality. 2. Mild for age cerebral white matter signal changes, most commonly due to chronic small vessel disease. Electronically Signed   By: HHerminio HeadsD.  On: 05/27/2021 07:41    ASSESSMENT AND PLAN: This is a an 86 years old white female who originally from [place] with metastatic renal cell carcinoma presented with large left renal mass in addition to left hemithorax pleural-based metastasis as well as left hilar and mediastinal lymphadenopathy diagnosed in 2022.  Her molecular studies showed no actionable mutations and PD-L1 expression was negative. The patient had MRI of the brain performed recently that showed no evidence of metastatic disease to the brain. She started systemic  treatment with immunotherapy with ipilimumab 1 mg/KG as well as nivolumab 3 mg/KG every 3 weeks status post 1 cycle.  She tolerated the first cycle of her treatment fairly well except for mild fatigue.  She has intermittent pain on the left side of the chest from her neoplasm and pleural effusion. I recommended for the patient to proceed with cycle #2 today as planned. I will see her back for follow-up visit in 3 weeks for evaluation before the next cycle of her treatment. She was advised to call immediately if she has any other concerning symptoms in the interval. The patient voices understanding of current disease status and treatment options and is in agreement with the current care plan.  All questions were answered. The patient knows to call the clinic with any problems, questions or concerns. We can certainly see the patient much sooner if necessary.   Disclaimer: This note was dictated with voice recognition software. Similar sounding words can inadvertently be transcribed and may not be corrected upon review.

## 2021-06-12 NOTE — Progress Notes (Signed)
Met with patient and son at registration to complete grant process.  Patient approved for one-time $1000 [employer] grant to assist with personal expenses while going through treatment. Discussed in detail expenses and how they are covered. They have a copy of the approval letter and expense sheet along with the Outpatient Pharmacy information. She received a gift card today.  They have my card for any additional financial questions or concerns.

## 2021-06-12 NOTE — Progress Notes (Signed)
Nutrition Assessment   Reason for Assessment: MST   ASSESSMENT: 86 year old female with metastatic renal cell carcinoma. She is receiving systemic immunotherapy. Patient is followed by Dr. Julien Nordmann.  Past medical history includes HTN, Dyslipidemia, left-sided pleural effusion s/p thoracentesis.   Met with patient and interpreter during infusion. Patient reports feeling anxious about treatment as she did before first treatment. Patient reports she is unable to to eat much at one time due to GERD, MD has prescribed Protonix. She has occasional pain on her left side, but this is better. Patient reports eating small amounts throughout the day. She usually eats oatmeal for breakfast, sandwich (bacon and cheese) or soup for lunch. Her son prepares dinner, says he makes a lot of fish with soft cooked vegetables. She drinks carrot juice ~3x/week and likes to snack on fruit and enjoys [place]. She denies nausea, vomiting, diarrhea, constipation. Patient has not tried oral nutrition supplements in the past, she is agreeable to trying today in infusion. She would prefer a strawberry flavor.  Nutrition Focused Physical Exam: deferred   Medications: Compazine, magnesium, ativan, protonix, percocet/roxicet   Labs: Na 130, Glucose 105   Anthropometrics: Weights have decreased 7.3% (9 lbs) from usual weight over the last 10 weeks. This is significant.   Height: 5'1" Weight: 113 lb 4.8 oz today UBW: 122 lb (7/27) BMI: 21.41    NUTRITION DIAGNOSIS: Unintentional weight loss related to cancer as evidenced by reported decreased intake and 7% decrease from usual weight in 10 weeks which is significant for time frame.    INTERVENTION:  Educated on importance of adequate calories and protein to maintain weights, strength, nutrition Encouraged eating small frequent meals and snacks  Discussed foods with protein, encouraged protein source with meals/snacks - handout provided Encouraged drinking 2 Ensure  Plus/equivalent daily as tolerated - coupons provided today. Patient given sample of strawberry Boost to try during infusion Contact information provided   MONITORING, EVALUATION, GOAL: Patient will tolerate increased calories and protein to promote weight gain   Next Visit: Tuesday November 15 during infusion

## 2021-06-12 NOTE — Progress Notes (Signed)
Ok to treat with elevated BP per MD

## 2021-06-12 NOTE — Progress Notes (Signed)

## 2021-06-13 ENCOUNTER — Telehealth: Payer: Self-pay | Admitting: Internal Medicine

## 2021-06-13 NOTE — Telephone Encounter (Signed)
Left message with follow-up appointment per 10/4 los.

## 2021-06-14 ENCOUNTER — Telehealth: Payer: Self-pay | Admitting: Medical Oncology

## 2021-06-14 NOTE — Telephone Encounter (Signed)
Will Dr.Mohamed complete a PCS form for  personal care aide? If not she will contact PCP. I told her to contact PCP.

## 2021-06-23 ENCOUNTER — Other Ambulatory Visit: Payer: Self-pay | Admitting: Internal Medicine

## 2021-06-24 ENCOUNTER — Encounter: Payer: Self-pay | Admitting: Internal Medicine

## 2021-07-03 ENCOUNTER — Inpatient Hospital Stay: Payer: Medicaid Other

## 2021-07-03 ENCOUNTER — Other Ambulatory Visit: Payer: Self-pay

## 2021-07-03 ENCOUNTER — Inpatient Hospital Stay: Payer: Medicaid Other | Admitting: Internal Medicine

## 2021-07-03 ENCOUNTER — Encounter: Payer: Self-pay | Admitting: Internal Medicine

## 2021-07-03 VITALS — BP 151/65 | HR 63 | Temp 96.6°F | Resp 17 | Wt 109.9 lb

## 2021-07-03 DIAGNOSIS — Z5112 Encounter for antineoplastic immunotherapy: Secondary | ICD-10-CM

## 2021-07-03 DIAGNOSIS — C642 Malignant neoplasm of left kidney, except renal pelvis: Secondary | ICD-10-CM

## 2021-07-03 LAB — CMP (CANCER CENTER ONLY)
ALT: <5 U/L (ref 0–44)
AST: 12 U/L — ABNORMAL LOW (ref 15–41)
Albumin: 3.6 g/dL (ref 3.5–5.0)
Alkaline Phosphatase: 92 U/L (ref 38–126)
Anion gap: 12 (ref 5–15)
BUN: 16 mg/dL (ref 8–23)
CO2: 23 mmol/L (ref 22–32)
Calcium: 9.8 mg/dL (ref 8.9–10.3)
Chloride: 95 mmol/L — ABNORMAL LOW (ref 98–111)
Creatinine: 0.81 mg/dL (ref 0.44–1.00)
GFR, Estimated: >60 mL/min (ref >60)
Glucose, Bld: 99 mg/dL (ref 70–99)
Potassium: 4.1 mmol/L (ref 3.5–5.1)
Sodium: 130 mmol/L — ABNORMAL LOW (ref 135–145)
Total Bilirubin: 0.5 mg/dL (ref 0.3–1.2)
Total Protein: 7.4 g/dL (ref 6.5–8.1)

## 2021-07-03 LAB — CBC WITH DIFFERENTIAL (CANCER CENTER ONLY)
Abs Immature Granulocytes: 0.04 10*3/uL (ref 0.00–0.07)
Basophils Absolute: 0.1 10*3/uL (ref 0.0–0.1)
Basophils Relative: 0 %
Eosinophils Absolute: 0.1 10*3/uL (ref 0.0–0.5)
Eosinophils Relative: 1 %
HCT: 33.7 % — ABNORMAL LOW (ref 36.0–46.0)
Hemoglobin: 10.9 g/dL — ABNORMAL LOW (ref 12.0–15.0)
Immature Granulocytes: 0 %
Lymphocytes Relative: 12 %
Lymphs Abs: 1.8 10*3/uL (ref 0.7–4.0)
MCH: 27.4 pg (ref 26.0–34.0)
MCHC: 32.3 g/dL (ref 30.0–36.0)
MCV: 84.7 fL (ref 80.0–100.0)
Monocytes Absolute: 1.8 10*3/uL — ABNORMAL HIGH (ref 0.1–1.0)
Monocytes Relative: 12 %
Neutro Abs: 10.9 10*3/uL — ABNORMAL HIGH (ref 1.7–7.7)
Neutrophils Relative %: 75 %
Platelet Count: 251 10*3/uL (ref 150–400)
RBC: 3.98 MIL/uL (ref 3.87–5.11)
RDW: 13.9 % (ref 11.5–15.5)
WBC Count: 14.7 10*3/uL — ABNORMAL HIGH (ref 4.0–10.5)
nRBC: 0 % (ref 0.0–0.2)

## 2021-07-03 LAB — TSH: TSH: 1.28 u[IU]/mL (ref 0.308–3.960)

## 2021-07-03 MED ORDER — FAMOTIDINE 20 MG IN NS 100 ML IVPB
20.0000 mg | Freq: Once | INTRAVENOUS | Status: AC
Start: 2021-07-03 — End: 2021-07-03
  Administered 2021-07-03: 20 mg via INTRAVENOUS
  Filled 2021-07-03: qty 100

## 2021-07-03 MED ORDER — DIPHENHYDRAMINE HCL 50 MG/ML IJ SOLN
25.0000 mg | Freq: Once | INTRAMUSCULAR | Status: AC
  Administered 2021-07-03: 25 mg via INTRAVENOUS
  Filled 2021-07-03: qty 1

## 2021-07-03 MED ORDER — SODIUM CHLORIDE 0.9 % IV SOLN
1.0000 mg/kg | Freq: Once | INTRAVENOUS | Status: AC
  Administered 2021-07-03: 50 mg via INTRAVENOUS
  Filled 2021-07-03: qty 10

## 2021-07-03 MED ORDER — SODIUM CHLORIDE 0.9% FLUSH
10.0000 mL | INTRAVENOUS | Status: DC | PRN
Start: 2021-07-03 — End: 2021-07-03
  Administered 2021-07-03: 10 mL

## 2021-07-03 MED ORDER — SODIUM CHLORIDE 0.9 % IV SOLN
3.1000 mg/kg | Freq: Once | INTRAVENOUS | Status: AC
  Administered 2021-07-03: 160 mg via INTRAVENOUS
  Filled 2021-07-03: qty 16

## 2021-07-03 MED ORDER — SODIUM CHLORIDE 0.9 % IV SOLN: Freq: Once | INTRAVENOUS | Status: AC

## 2021-07-03 MED ORDER — SODIUM CHLORIDE 0.9 % IV SOLN: 1.0000 mg/kg | Freq: Once | INTRAVENOUS | Status: DC

## 2021-07-03 NOTE — Progress Notes (Signed)
[facility] [phone]   Fax:(336) [phone]  OFFICE PROGRESS NOTE  [name], MD [place] [address] [place] [address]  DIAGNOSIS: Metastatic renal cell carcinoma presented with large left renal mass in addition to significant left hemothorax pleural-based metastasis as well as left hilar and mediastinal lymphadenopathy diagnosed in August 2022.  Biomarker Findings Microsatellite status - MS-Stable Tumor Mutational Burden - 2 Muts/Mb Genomic Findings For a complete list of the genes assayed, please refer to the Appendix. MTAP loss CDKN2A/B CDKN2A loss 8 Disease relevant genes with no reportable Alteratio  PD-L1 expression 0%  PRIOR THERAPY: None  CURRENT THERAPY: Systemic immunotherapy with ipilimumab 1 mg/KG in addition to nivolumab 3 mg/KG every 3 weeks for 4 cycles followed by maintenance treatment with nivolumab.  Status post 2 cycles.  First dose started on 05/22/2021.  INTERVAL HISTORY: Gail Fitzgerald 86 y.o. female returns to the clinic today for follow-up visit accompanied by her [place] interpreter as well as her daughter-in-law.  The patient continues to tolerate this treatment well except for the aching pain in the shoulder as well as back with radiation to the left anterior chest.  She denied having any shortness of breath, cough or hemoptysis.  She denied having any fever or chills.  She has no nausea, vomiting, diarrhea or constipation.  She has no headache or visual changes.  She has Percocet at home but she does not like to take it a lot because of concern about addiction and also she get fuzzy head.  She is here today for evaluation before starting cycle #3 of her treatment.  MEDICAL HISTORY: Past Medical History:  Diagnosis Date   Hypertension     ALLERGIES:  has No Known Allergies.  MEDICATIONS:  Current Outpatient Medications  Medication Sig Dispense Refill   aspirin EC 81 MG tablet Take 81 mg by mouth  every evening. Swallow whole.     benazepril (LOTENSIN) 10 MG tablet Take 10 mg by mouth 2 (two) times daily.     ibuprofen (ADVIL) 200 MG tablet Take 400 mg by mouth every 8 (eight) hours as needed (pain.).     LORazepam (ATIVAN) 0.5 MG tablet 1 tablet p.o. 30 minutes before the MRI.  May repeat once if needed. 2 tablet 0   Magnesium 400 MG CAPS Take 400 mg by mouth See admin instructions. Takes 1 tablet (400 mg) by mouth once daily 10 days on and 10 days off.     metoprolol succinate (TOPROL-XL) 100 MG 24 hr tablet Take 50 mg by mouth in the morning and at bedtime.     OVER THE COUNTER MEDICATION Take 1 tablet by mouth daily as needed (for stomach discomfort). Allochol - (Herbal Supplement) Multivitamin with : Magnesium, potassium, garlic and charcoal. (Russian Medication) (Patient not taking: Reported on 05/09/2021)     oxyCODONE-acetaminophen (PERCOCET/ROXICET) 5-325 MG tablet Take 1 tablet by mouth every 8 (eight) hours as needed for severe pain. 30 tablet 0   pantoprazole (PROTONIX) 40 MG tablet TAKE 1 TABLET BY MOUTH EVERY DAY 30 tablet 1   prochlorperazine (COMPAZINE) 10 MG tablet Take 1 tablet (10 mg total) by mouth every 6 (six) hours as needed for nausea or vomiting. 30 tablet 0   simvastatin (ZOCOR) 20 MG tablet Take 20 mg by mouth every evening.     UNABLE TO FIND Take 1 tablet by mouth daily as needed (chest pain (angina)). Med Name: Validol (Menthyl isovalerate and menthol) (Russian Medication)  No current facility-administered medications for this visit.    SURGICAL HISTORY:  Past Surgical History:  Procedure Laterality Date   IR THORACENTESIS ASP PLEURAL SPACE W/IMG GUIDE  04/05/2021   THORACENTESIS N/A 04/12/2021   Procedure: Mathews Robinsons;  Surgeon: [name], DO;  Location: [facility];  Service: Pulmonary;  Laterality: N/A;    REVIEW OF SYSTEMS:  A comprehensive review of systems was negative except for: Constitutional: positive for fatigue Musculoskeletal:  positive for arthralgias   PHYSICAL EXAMINATION: General appearance: alert, cooperative, fatigued, and no distress Head: Normocephalic, without obvious abnormality, atraumatic Neck: no adenopathy, no JVD, supple, symmetrical, trachea midline, and thyroid not enlarged, symmetric, no tenderness/mass/nodules Lymph nodes: Cervical, supraclavicular, and axillary nodes normal. Resp: diminished breath sounds LLL and dullness to percussion LLL Back: symmetric, no curvature. ROM normal. No CVA tenderness. Cardio: regular rate and rhythm, S1, S2 normal, no murmur, click, rub or gallop GI: soft, non-tender; bowel sounds normal; no masses,  no organomegaly Extremities: extremities normal, atraumatic, no cyanosis or edema  ECOG PERFORMANCE STATUS: 1 - Symptomatic but completely ambulatory  Blood pressure (!) 151/65, pulse 63, temperature (!) 96.6 F (35.9 C), temperature source Tympanic, resp. rate 17, weight 109 lb 14.4 oz (49.9 kg), SpO2 99 %.  LABORATORY DATA: Lab Results  Component Value Date   WBC 14.7 (H) 07/03/2021   HGB 10.9 (L) 07/03/2021   HCT 33.7 (L) 07/03/2021   MCV 84.7 07/03/2021   PLT 251 07/03/2021      Chemistry      Component Value Date/Time   NA 130 (L) 07/03/2021 1012   K 4.1 07/03/2021 1012   CL 95 (L) 07/03/2021 1012   CO2 23 07/03/2021 1012   BUN 16 07/03/2021 1012   CREATININE 0.81 07/03/2021 1012      Component Value Date/Time   CALCIUM 9.8 07/03/2021 1012   ALKPHOS 92 07/03/2021 1012   AST 12 (L) 07/03/2021 1012   ALT <5 07/03/2021 1012   BILITOT 0.5 07/03/2021 1012       RADIOGRAPHIC STUDIES: No results found.  ASSESSMENT AND PLAN: This is a an 86 years old white female who originally from [place] with metastatic renal cell carcinoma presented with large left renal mass in addition to left hemithorax pleural-based metastasis as well as left hilar and mediastinal lymphadenopathy diagnosed in 2022.  Her molecular studies showed no actionable mutations and  PD-L1 expression was negative. The patient had MRI of the brain performed recently that showed no evidence of metastatic disease to the brain. She started systemic treatment with immunotherapy with ipilimumab 1 mg/KG as well as nivolumab 3 mg/KG every 3 weeks status post 2 cycles.  She has intermittent pain on the left side of the chest from her neoplasm and pleural effusion. The patient continues to tolerate her treatment with immunotherapy fairly well with no concerning adverse effects. I recommended for her to proceed with cycle #3 today as planned. I will see her back for follow-up visit in 3 weeks for evaluation before starting cycle #4.  I will consider repeating the imaging studies after completion of the induction phase with ipilimumab and nivolumab with cycle #4. For pain management she will continue her current treatment with Tylenol and Percocet if needed. She was advised to call immediately if she has any other concerning symptoms in the interval. The patient voices understanding of current disease status and treatment options and is in agreement with the current care plan. The total time spent in the appointment was 30 minutes.  All questions were answered. The patient knows to call the clinic with any problems, questions or concerns. We can certainly see the patient much sooner if necessary.   Disclaimer: This note was dictated with voice recognition software. Similar sounding words can inadvertently be transcribed and may not be corrected upon review.

## 2021-07-03 NOTE — Addendum Note (Signed)
Addended by: Ardeen Garland on: 07/03/2021 11:55 AM   Modules accepted: Orders

## 2021-07-03 NOTE — Patient Instructions (Signed)
Nivolumab injection ??? ???????????? ????? ??? ?????????? ????????? - ??? ???????? ?????????????? ???????. ?? ??????????? ??? ??????? ???? ????????? ?????, ???? ????????, ???? ?????? ? ???, ??????????? ???????, ???? ?????, ???? ??????, ???? ???????, ???????????, ???????? ? ???? ??????? ?????. ??? ????????? ????? ??????????? ? ?????? ?????; ???? ? ??? ????????? ???????, ?????????? ? ???????????? ????????? ??? ??????????. ???????????????? ????????? ???????? (????????): [employer]divo ??? ??? ??????? ?????????? ???????????? ????????? ?? ?????? ?????? ????? ?????????? ?????????? ?????, ???? ?? ? ??? ???? ?? ????????? ?????????: ???????????? ???????????, ????? ??? ??????? ?????, ???????? ????? ??? ????????; ???????????? ??? ??????????????? ?????????? ?????????????? ????????? ?????? (????????? ????????? ?????? ??????? ????????); ????????? ??????? ??????? ?????? ? ???????; ?????????????? ?????? ? ???????; ???????????? ??????? ???????, ????? ??? ????????? ?????? ??? ??????? ??????-?????; ????????? ??? ????????????? ??????? ?? ?????????; ????????? ??? ????????????? ??????? ?? ?????? ?????????; ????????? ??? ????????????? ??????? ?? ??????? ????????, ????????? ??? ???????????; ???????????? ??? ???????????? ????????????; ????????? ??????; ??? ??? ??????? ????????? ??? ?????????? ??? ????????? ????????????? ??? ????????????? ????????. ??? ????????? ???????? ?????? ??? ?????? ??????????? ?????????? ? ???????? ??? ???????. ????? ??????? ??????? ??? ????? ????????????? ??????????? ?????????? ? ????????? (MedGuide). ?????? ??? ??????? ??????????? ???????? ??? ??????????. ???????? ???????? ? ??????????? ?????????? ????? ????????? ?????. ???????? ?? ??, ??? ??? ????????? ????? ????????? ????? ? 12 ??? ?? ???????????? ??????????, ??????? ????????? ???? ????????????????. ?????????????: ???? ??? ???????, ??? ?? ????????? ?????????? ???? ????? ?????????, ?????????? ?????????? ? ????????????????? ????? ??? ???????? ?????????  ??? ???????? ?????????? ??????. ??????????: ??? ????????? ????????????? ?????? ??? ???. ?? ???????? ?? ? ??????? ??????. ??? ?????????? ? ?????? ???????? ?????? ?????????? ????? ????????? ????????? ?????????. ???????? ????? ??? ??????? ???????????? ?????????, ???? ?? ?? ?????? ??????? ?? ????? ? ??????????? ?????. ? ??? ??? ????????? ????? ???????? ?? ??????????????? ?????????????? ?? ?????????. ???? ???????? ?? ???????? ???? ????????? ??????????????. ???????????? ???????????? ????????? ?????? ???? ??????????? ???? ????????, ????????????? ????????, ?????????????? ?????????? ? ??????? ???????. ????? ???????? ??? ? ???????, ???????????? ??????????? ???????? ??? ??????????. ????????? ???????? ????? ???????? ?? ?????????????? ? ??????????? ???? ??????????. ?? ??? ????? ???????? ???????? ??? ????????????? ????? ?????????? ??? ????????? ????? ??????? ????????? ?????? ????????????. ??????????? ???? ??????? ???? ? ?????? ??????? ????????????, ???? ???? ?? ???????????? ????. ?? ????? ?????? ????? ????????? ??? ??????? ????????? ??????? ??????? ?????. ????????? ???????????? ?? ????? ?????? ????? ????????? ? ? ??????? 5 ??????? ????? ??? ???????????. ???????? ??????? ???????? ????? ? ???????????? ???????????? ??? ????????????? ? ????????? ????????????. ?????????? ??????????? ???????? ????? ????????? ?? ????. ?? ?????????????? ??????????? ?????????? ? ?????, ??????? ???????????? ????????? ??? ??????????. ?? ????? ?????????? ????? ????????? ? ? ??????? 5 ??????? ????? ??? ??????????? ?????? ??????? ??????? ??????. ????? ???????? ??????? ????? ???? ??? ?????? ????? ?????????? ???????? ???????, ? ??????? ??? ??????? ??? ????? ?????? ???????? ????? ??? ??????? ???????????? ?????????: ????????????? ???????, ????? ??? ?????? ????, ???, ??????????, ????????? ????, ??? ??? ?????; ??????????? ???????; ??????? ????? ? ????; ?????????? ???, ??? ? ???????? ????? ??? ?????? ????????????? ???; ????????? ?????? ?  ??????????; ????????? ??????; ???????????; ??????; ??????????????; ??????????????, ???????, ???????; ????????? ??????????? ??? ?????; ???????? ????, ???????????????? ?????????? ???????????, ?????????? ?????????? ????, ??????????? ????????, ?????? ??????, ?????????????????????, ????????????, ?????? ????? ? ??????????? ??? ????????; ???? ? ????????; ???? ?? ???; ??????????? ????, ??????????? ???????, ????????? ??? ?????? ???????????? ? ????????? ????, ? ??? ????? ????????? ?? ???; ??????? ???? ? ?????? ??? ???????? ????????; ??????????? ???????? ??????????? ?????? ?????? ?????, ???????? ??????????????, ??????? ?? ???, ??????? ????, ????????? ????? ??? ???, ???????, ???? ? ??????, ???????? ???????? ?????? ??? ?????, ?????? ?????? ? ??????????????; ??????????? ???????? ? ???????? ??????????? ?????, ????? ??? ???????????? ?????????????? ??? ????????? ?????????? ????; ??????????? ???????? ??????????? ??????, ???????? ???? ?????-??????? ??? ??????????? ?????, ????? ??????????? ??? ?????????????? ????????, ?????????????? ????, ????????? ????????, ???????, ???? ? ?????? ??????????, ????????? ???????? ??? ?????? ???, ?????????? ?????? ???? ??? ????; ????????? ???????, ???? ??? ??????; ??????????? ??? ?????????????? ??? ????????? ?????????? ?????????? ????; ????????? ???????? ??? ????????????; ?????????? ??? ???????? ????? ????; ???????? ???????, ??????? ?????? ?? ??????? ???????????? ???????????? ???????? (???????? ????? ??? ??????? ???????????? ?????????, ???? ??? ???????????? ??? ?????????? ??????????): ???? ? ??????; ?????; ?????? ????????; ??????; ???????? ????; ??????? ??? ?????; ??????? ?????????; ???? ???????? ?? ???????? ???? ????????? ???????? ????????. ?????????? ? ????? ?? ??????? ???????????? ???????? ????????. ?? ?????? ???????? ? ???????? ???????? ?  FDA ?? ???????? [phone]. ??? ??????? ??????? ??? ?????????? ??? ????????? ???????? ?????? ??? ?????? ??????????? ?????????? ?  ???????? ??? ???????. ??? ????????? ?? ???????? ??? ???????? ? ???????? ????????. ??????????: ???? ???????? ?? ???????? ???? ????????? ????????. ???? ? ??? ????????? ???????, ?????????? ??????? ?????????, ?????????? ? ?????, ?????????? ??? ??????? ???????????? ?????????.  2022 Elsevier/Gold Standard (2019-12-30 00:00:00) Ipilimumab injection ??? ???????????? ????? ??? ?????????? ?????????? - ??? ???????? ?????????????? ???????. ?? ??????????? ??? ??????? ???? ?????? ? ????????? ?????, ???? ?????, ???? ??????, ???? ???????, ???????? ? ???????????. ??? ????????? ????? ??????????? ? ?????? ?????; ???? ? ??? ????????? ???????, ?????????? ? ???????????? ????????? ??? ??????????. ???????????????? ????????? ???????? (????????): YERVOY ??? ??? ??????? ?????????? ???????????? ????????? ?? ?????? ?????? ????? ?????????? ?????????? ?????, ???? ?? ? ??? ???? ?? ????????? ?????????: ???????????? ???????????, ????? ??? ??????? ?????, ???????? ????? ??? ????????; ???????????? ??? ??????????????? ?????????? ?????????????? ????????? ?????? (????????? ????????? ?????? ??????? ????????); ?????????????? ?????? ? ???????; ???????????? ??????? ???????, ????? ??? ????????? ?????? ??? ??????? ??????-?????; ????????? ??? ????????????? ??????? ?? ??????????; ????????? ??? ????????????? ??????? ?? ?????? ?????????; ????????? ??? ????????????? ??????? ?? ??????? ????????, ????????? ??? ???????????; ???????????? ??? ???????????? ????????????; ????????? ??????; ??? ??? ??????? ????????? ??? ?????????? ??? ????????? ????????????? ??? ????????????? ????????. ??? ????????? ???????? ?????? ??? ?????? ??????????? ?????????? ? ???????? ??? ???????. ????? ??????? ??????? ??? ????? ????????????? ??????????? ?????????? ? ????????? (MedGuide). ?????? ??? ??????? ??????????? ???????? ??? ??????????. ???????? ???????? ? ??????????? ?????????? ????? ????????? ?????. ???????? ?? ??, ??? ??? ????????? ????? ????????? ????? ? 12 ??? ??  ???????????? ??????????, ??????? ????????? ???? ????????????????. ?????????????: ???? ??? ???????, ??? ?? ????????? ?????????? ???? ????? ?????????, ?????????? ?????????? ? ????????????????? ????? ??? ???????? ????????? ??? ???????? ?????????? ??????. ??????????: ??? ????????? ????????????? ?????? ??? ???. ?? ???????? ?? ? ??????? ??????. ??? ?????????? ? ?????? ???????? ?????? ?????????? ????? ????????? ????????? ?????????. ???????? ????? ??? ??????? ???????????? ?????????, ???? ?? ?? ?????? ??????? ?? ????? ? ??????????? ?????. ? ??? ??? ????????? ????? ???????? ?? ??????????????? ?????????????? ?? ???????????. ???? ???????? ?? ???????? ???? ????????? ??????????????. ???????????? ???????????? ????????? ?????? ???? ??????????? ???? ????????, ????????????? ????????, ?????????????? ?????????? ? ??????? ???????. ????? ???????? ??? ? ???????, ???????????? ??????????? ???????? ??? ??????????. ????????? ???????? ????? ???????? ?? ?????????????? ? ??????????? ???? ??????????. ?? ??? ????? ???????? ???????? ??? ????????????? ????? ?????????? ?????????? ? ????? ??? ??????? ???????????? ?????????, ???? ????????? ?? ?????????? ??? ??????????. ????????? ???????????? ?? ????? ?????? ????? ????????? ? ? ??????? 3 ??????? ????? ??????????? ???????. ???????? ??????? ???????? ????? ? ???????????? ???????????? ??? ????????????? ? ????????? ????????????. ?????????? ??????????? ???????? ????? ????????? ?? ????. ?? ?????????????? ??????????? ?????????? ? ?????, ??????? ???????????? ????????? ??? ??????????. ?? ????? ??????? ???? ?????????? ? ? ??????? 3 ??????? ????? ???????? ????????? ???? ?????? ??????? ??????? ??????. ?? ????? ?????? ????? ????????? ?? ?????? ??? ?????????? ??????????? ?????. ?? ????? ?????? ????? ????????? ??? ??????? ????????? ??????? ??????? ?????. ????? ???????? ??????? ????? ???? ??? ?????? ????? ?????????? ???????? ???????, ? ??????? ??? ??????? ??? ????? ?????? ???????? ????? ??? ???????  ???????????? ?????????: ????????????? ???????, ????? ??? ?????? ????, ???, ??????????, ????????? ????, ??? ??? ?????; ??????, ????????????? ???; ???????? ??? ?????????? ???; ????????? ??????; ??????????????; ???? ? ?????; ?????????? ??? ???????; ??????? ???????; ??????????????, ???????, ???????; ??????? ??? ?????; ????, ???????? ??? ??????????? ? ?????? ? ??????; ??????????? ????, ??????????? ???????, ????????? ??? ?????? ???????????? ? ????????? ????, ? ??? ????? ????????? ?? ???; ??????????? ???????? ??????????? ??????, ???????? ???? ?????-??????? ??? ??????????? ?????, ????? ??????????? ??? ?????????????? ????????, ?????????????? ????, ????????? ????????, ???????, ???? ? ?????? ??????????, ????????? ???????? ??? ?????? ???, ?????????? ?????? ???? ??? ????; ????????? ???????????? ??? ??????; ???????? ???????, ??????? ?????? ?? ??????? ???????????? ???????????? ???????? (???????? ????? ??? ??????? ???????????? ?????????, ???? ??? ???????????? ??? ?????????? ??????????): ???????? ????; ?????? ????????; ??????????; ???? ???????? ?? ???????? ???? ????????? ???????? ????????. ?????????? ? ????? ?? ??????? ???????????? ???????? ????????. ?? ?????? ???????? ? ???????? ???????? ?  FDA ?? ???????? [phone]. ??? ??????? ??????? ??? ?????????? ??? ????????? ???????? ?????? ??? ?????? ??????????? ?????????? ? ???????? ??? ???????. ??? ????????? ?? ???????? ??? ???????? ? ???????? ????????. ??????????: ???? ???????? ?? ???????? ???? ????????? ????????. ???? ? ??? ????????? ???????, ?????????? ??????? ?????????, ?????????? ? ?????, ?????????? ??? ??????? ???????????? ?????????.  2022 Elsevier/Gold Standard (2019-12-31 00:00:00)

## 2021-07-13 ENCOUNTER — Telehealth: Payer: Self-pay | Admitting: Medical Oncology

## 2021-07-13 NOTE — Telephone Encounter (Signed)
Joint pain getting worse  with each immunotherapy tx. She took percocet last night at 9 and again at 3 am today and 9 am with little relief.Pain intensity 10/10. Tylenol arthritis did not help .    Laying down brings the pain intensity to 6.  Per Dr Julien Nordmann , I instructed Rayford Halsted that pt can taked Ibuprofen 200 mg bid PRN for a couple of days  for breakthrough -She should not take more than 400 mg /day  and to alternate with Tylenol.   She can take one extra  percocet in 6 hours x 1 if she needs it . Then go back to 1 tablet every 8 hours prn .Luva voiced understanding.

## 2021-07-17 ENCOUNTER — Other Ambulatory Visit: Payer: Self-pay | Admitting: Internal Medicine

## 2021-07-24 ENCOUNTER — Encounter: Payer: Self-pay | Admitting: Internal Medicine

## 2021-07-24 ENCOUNTER — Inpatient Hospital Stay: Payer: Medicaid Other | Admitting: Dietician

## 2021-07-24 ENCOUNTER — Inpatient Hospital Stay: Payer: Medicaid Other | Attending: Internal Medicine

## 2021-07-24 ENCOUNTER — Other Ambulatory Visit: Payer: Self-pay

## 2021-07-24 ENCOUNTER — Inpatient Hospital Stay: Payer: Medicaid Other

## 2021-07-24 ENCOUNTER — Inpatient Hospital Stay: Payer: Medicaid Other | Admitting: Internal Medicine

## 2021-07-24 VITALS — BP 141/66 | HR 66 | Temp 98.2°F | Resp 16 | Ht 61.0 in | Wt 106.2 lb

## 2021-07-24 DIAGNOSIS — Z5112 Encounter for antineoplastic immunotherapy: Secondary | ICD-10-CM | POA: Insufficient documentation

## 2021-07-24 DIAGNOSIS — Z79899 Other long term (current) drug therapy: Secondary | ICD-10-CM | POA: Diagnosis not present

## 2021-07-24 DIAGNOSIS — C642 Malignant neoplasm of left kidney, except renal pelvis: Secondary | ICD-10-CM | POA: Diagnosis not present

## 2021-07-24 DIAGNOSIS — C7802 Secondary malignant neoplasm of left lung: Secondary | ICD-10-CM | POA: Insufficient documentation

## 2021-07-24 LAB — CBC WITH DIFFERENTIAL (CANCER CENTER ONLY)
Abs Immature Granulocytes: 0.05 10*3/uL (ref 0.00–0.07)
Basophils Absolute: 0.1 10*3/uL (ref 0.0–0.1)
Basophils Relative: 1 %
Eosinophils Absolute: 0.1 10*3/uL (ref 0.0–0.5)
Eosinophils Relative: 1 %
HCT: 32.9 % — ABNORMAL LOW (ref 36.0–46.0)
Hemoglobin: 11 g/dL — ABNORMAL LOW (ref 12.0–15.0)
Immature Granulocytes: 0 %
Lymphocytes Relative: 13 %
Lymphs Abs: 1.9 10*3/uL (ref 0.7–4.0)
MCH: 27.5 pg (ref 26.0–34.0)
MCHC: 33.4 g/dL (ref 30.0–36.0)
MCV: 82.3 fL (ref 80.0–100.0)
Monocytes Absolute: 1.7 10*3/uL — ABNORMAL HIGH (ref 0.1–1.0)
Monocytes Relative: 12 %
Neutro Abs: 10.2 10*3/uL — ABNORMAL HIGH (ref 1.7–7.7)
Neutrophils Relative %: 73 %
Platelet Count: 285 10*3/uL (ref 150–400)
RBC: 4 MIL/uL (ref 3.87–5.11)
RDW: 14.1 % (ref 11.5–15.5)
WBC Count: 14 10*3/uL — ABNORMAL HIGH (ref 4.0–10.5)
nRBC: 0 % (ref 0.0–0.2)

## 2021-07-24 LAB — CMP (CANCER CENTER ONLY)
ALT: <5 U/L (ref 0–44)
AST: 11 U/L — ABNORMAL LOW (ref 15–41)
Albumin: 3.4 g/dL — ABNORMAL LOW (ref 3.5–5.0)
Alkaline Phosphatase: 85 U/L (ref 38–126)
Anion gap: 9 (ref 5–15)
BUN: 15 mg/dL (ref 8–23)
CO2: 23 mmol/L (ref 22–32)
Calcium: 9.3 mg/dL (ref 8.9–10.3)
Chloride: 96 mmol/L — ABNORMAL LOW (ref 98–111)
Creatinine: 0.72 mg/dL (ref 0.44–1.00)
GFR, Estimated: >60 mL/min (ref >60)
Glucose, Bld: 96 mg/dL (ref 70–99)
Potassium: 4.4 mmol/L (ref 3.5–5.1)
Sodium: 128 mmol/L — ABNORMAL LOW (ref 135–145)
Total Bilirubin: 0.4 mg/dL (ref 0.3–1.2)
Total Protein: 7.2 g/dL (ref 6.5–8.1)

## 2021-07-24 LAB — TSH: TSH: 1.891 u[IU]/mL (ref 0.308–3.960)

## 2021-07-24 MED ORDER — SODIUM CHLORIDE 0.9 % IV SOLN
1.0000 mg/kg | Freq: Once | INTRAVENOUS | Status: AC
  Administered 2021-07-24: 50 mg via INTRAVENOUS
  Filled 2021-07-24: qty 10

## 2021-07-24 MED ORDER — DIPHENHYDRAMINE HCL 50 MG/ML IJ SOLN
25.0000 mg | Freq: Once | INTRAMUSCULAR | Status: AC
  Administered 2021-07-24: 25 mg via INTRAVENOUS
  Filled 2021-07-24: qty 1

## 2021-07-24 MED ORDER — SODIUM CHLORIDE 0.9 % IV SOLN: 1.0000 mg/kg | Freq: Once | INTRAVENOUS | Status: DC

## 2021-07-24 MED ORDER — SODIUM CHLORIDE 0.9 % IV SOLN: Freq: Once | INTRAVENOUS | Status: AC

## 2021-07-24 MED ORDER — SODIUM CHLORIDE 0.9 % IV SOLN
160.0000 mg | Freq: Once | INTRAVENOUS | Status: AC
  Administered 2021-07-24: 160 mg via INTRAVENOUS
  Filled 2021-07-24: qty 16

## 2021-07-24 MED ORDER — FAMOTIDINE 20 MG IN NS 100 ML IVPB
20.0000 mg | Freq: Once | INTRAVENOUS | Status: AC
  Administered 2021-07-24: 20 mg via INTRAVENOUS
  Filled 2021-07-24: qty 100

## 2021-07-24 NOTE — Patient Instructions (Signed)
[facility]   Discharge Instructions: Thank you for choosing [facility] to provide your oncology and hematology care.   If you have a lab appointment with the [facility], please go directly to the [facility] and check in at the registration area.   Wear comfortable clothing and clothing appropriate for easy access to any Portacath or PICC line.   We strive to give you quality time with your provider. You may need to reschedule your appointment if you arrive late (15 or more minutes).  Arriving late affects you and other patients whose appointments are after yours.  Also, if you miss three or more appointments without notifying the office, you may be dismissed from the clinic at the provider's discretion.      For prescription refill requests, have your pharmacy contact our office and allow 72 hours for refills to be completed.    Today you received the following chemotherapy and/or immunotherapy agents: nivolumab and ipilimumab.      To help prevent nausea and vomiting after your treatment, we encourage you to take your nausea medication as directed.  BELOW ARE SYMPTOMS THAT SHOULD BE REPORTED IMMEDIATELY: *FEVER GREATER THAN 100.4 F (38 C) OR HIGHER *CHILLS OR SWEATING *NAUSEA AND VOMITING THAT IS NOT CONTROLLED WITH YOUR NAUSEA MEDICATION *UNUSUAL SHORTNESS OF BREATH *UNUSUAL BRUISING OR BLEEDING *URINARY PROBLEMS (pain or burning when urinating, or frequent urination) *BOWEL PROBLEMS (unusual diarrhea, constipation, pain near the anus) TENDERNESS IN MOUTH AND THROAT WITH OR WITHOUT PRESENCE OF ULCERS (sore throat, sores in mouth, or a toothache) UNUSUAL RASH, SWELLING OR PAIN  UNUSUAL VAGINAL DISCHARGE OR ITCHING   Items with * indicate a potential emergency and should be followed up as soon as possible or go to the Emergency Department if any problems should occur.  Please show the CHEMOTHERAPY ALERT CARD or IMMUNOTHERAPY ALERT  CARD at check-in to the Emergency Department and triage nurse.  Should you have questions after your visit or need to cancel or reschedule your appointment, please contact [facility]  Dept: [phone]  and follow the prompts.  Office hours are 8:00 a.m. to 4:30 p.m. Monday - Friday. Please note that voicemails left after 4:00 p.m. may not be returned until the following business day.  We are closed weekends and major holidays. You have access to a nurse at all times for urgent questions. Please call the main number to the clinic Dept: [phone] and follow the prompts.   For any non-urgent questions, you may also contact your provider using MyChart. We now offer e-Visits for anyone 29 and older to request care online for non-urgent symptoms. For details visit [email or web address].   Also download the MyChart app! Go to the app store, search "MyChart", open the app, select [facility], and log in with your MyChart username and password.  Due to Covid, a mask is required upon entering the hospital/clinic. If you do not have a mask, one will be given to you upon arrival. For doctor visits, patients may have 1 support person aged 29 or older with them. For treatment visits, patients cannot have anyone with them due to current Covid guidelines and our immunocompromised population.

## 2021-07-24 NOTE — Progress Notes (Signed)
[facility] [phone]   [phone]  OFFICE PROGRESS NOTE  [name], MD [address] [place] [address]  DIAGNOSIS: Metastatic renal cell carcinoma presented with large left renal mass in addition to significant left hemothorax pleural-based metastasis as well as left hilar and mediastinal lymphadenopathy diagnosed in August 2022.  Biomarker Findings Microsatellite status - MS-Stable Tumor Mutational Burden - 2 Muts/Mb Genomic Findings For a complete list of the genes assayed, please refer to the Appendix. MTAP loss CDKN2A/B CDKN2A loss 8 Disease relevant genes with no reportable Alteratio  PD-L1 expression 0%  PRIOR THERAPY: None  CURRENT THERAPY: Systemic immunotherapy with ipilimumab 1 mg/KG in addition to nivolumab 3 mg/KG every 3 weeks for 4 cycles followed by maintenance treatment with nivolumab.  Status post 3 cycles.  First dose started on 05/22/2021.  INTERVAL HISTORY: Gail Fitzgerald 86 y.o. female returns to the clinic today for follow-up visit accompanied by her [place] interpreter.  The patient is feeling fine today with no concerning complaints except for mild fatigue and arthralgia all over her body.  She took Tylenol with some help.  She is also on Percocet but does not help her arthritis.  The patient denied having any significant chest pain, shortness of breath, cough or hemoptysis.  She denied having any fever or chills.  She has no nausea, vomiting, diarrhea or constipation.  She has no headache or visual changes.  She lost few pounds since her last visit.  She is here today for evaluation before starting cycle #4 of her treatment.  MEDICAL HISTORY: Past Medical History:  Diagnosis Date   Hypertension     ALLERGIES:  has no active allergies.  MEDICATIONS:  Current Outpatient Medications  Medication Sig Dispense Refill   aspirin EC 81 MG tablet Take 81 mg by mouth every evening. Swallow  whole.     benazepril (LOTENSIN) 20 MG tablet Take 20 mg by mouth daily.     metoprolol succinate (TOPROL-XL) 100 MG 24 hr tablet Take 50 mg by mouth in the morning and at bedtime.     oxyCODONE-acetaminophen (PERCOCET/ROXICET) 5-325 MG tablet Take 1 tablet by mouth every 8 (eight) hours as needed for severe pain. 30 tablet 0   pantoprazole (PROTONIX) 40 MG tablet TAKE 1 TABLET BY MOUTH EVERY DAY 30 tablet 1   prochlorperazine (COMPAZINE) 10 MG tablet Take 1 tablet (10 mg total) by mouth every 6 (six) hours as needed for nausea or vomiting. 30 tablet 0   simvastatin (ZOCOR) 20 MG tablet Take 20 mg by mouth every evening.     Aflibercept (EYLEA IO) Inject 1 Dose into the eye every 30 (thirty) days. IO left eye monthly     ibuprofen (ADVIL) 200 MG tablet Take 400 mg by mouth every 8 (eight) hours as needed (pain.). (Patient not taking: Reported on 07/24/2021)     LORazepam (ATIVAN) 0.5 MG tablet 1 tablet p.o. 30 minutes before the MRI.  May repeat once if needed. 2 tablet 0   Magnesium 400 MG CAPS Take 400 mg by mouth See admin instructions. Takes 1 tablet (400 mg) by mouth once daily 10 days on and 10 days off. (Patient not taking: No sig reported)     OVER THE COUNTER MEDICATION Take 1 tablet by mouth daily as needed (for stomach discomfort). Allochol - (Herbal Supplement) Multivitamin with : Magnesium, potassium, garlic and charcoal. (Russian Medication)     UNABLE TO FIND Take 1 tablet  by mouth daily as needed (chest pain (angina)). Med Name: Validol (Menthyl isovalerate and menthol) (Russian Medication)     No current facility-administered medications for this visit.    SURGICAL HISTORY:  Past Surgical History:  Procedure Laterality Date   IR THORACENTESIS ASP PLEURAL SPACE W/IMG GUIDE  04/05/2021   THORACENTESIS N/A 04/12/2021   Procedure: Mathews Robinsons;  Surgeon: [name], DO;  Location: [facility];  Service: Pulmonary;  Laterality: N/A;    REVIEW OF SYSTEMS:  Constitutional:  positive for fatigue and weight loss Eyes: negative Ears, nose, mouth, throat, and face: negative Respiratory: negative Cardiovascular: negative Gastrointestinal: negative Genitourinary:negative Integument/breast: negative Hematologic/lymphatic: negative Musculoskeletal:positive for arthralgias Neurological: negative Behavioral/Psych: negative Endocrine: negative Allergic/Immunologic: negative   PHYSICAL EXAMINATION: General appearance: alert, cooperative, fatigued, and no distress Head: Normocephalic, without obvious abnormality, atraumatic Neck: no adenopathy, no JVD, supple, symmetrical, trachea midline, and thyroid not enlarged, symmetric, no tenderness/mass/nodules Lymph nodes: Cervical, supraclavicular, and axillary nodes normal. Resp: clear to auscultation bilaterally Back: symmetric, no curvature. ROM normal. No CVA tenderness. Cardio: regular rate and rhythm, S1, S2 normal, no murmur, click, rub or gallop GI: soft, non-tender; bowel sounds normal; no masses,  no organomegaly Extremities: extremities normal, atraumatic, no cyanosis or edema Neurologic: Alert and oriented X 3, normal strength and tone. Normal symmetric reflexes. Normal coordination and gait  ECOG PERFORMANCE STATUS: 1 - Symptomatic but completely ambulatory  Blood pressure (!) 141/66, pulse 66, temperature 98.2 F (36.8 C), temperature source Temporal, resp. rate 16, height _0  (1.549 m), weight 106 lb 3.2 oz (48.2 kg), SpO2 98 %.  LABORATORY DATA: Lab Results  Component Value Date   WBC 14.0 (H) 07/24/2021   HGB 11.0 (L) 07/24/2021   HCT 32.9 (L) 07/24/2021   MCV 82.3 07/24/2021   PLT 285 07/24/2021      Chemistry      Component Value Date/Time   NA 128 (L) 07/24/2021 1034   K 4.4 07/24/2021 1034   CL 96 (L) 07/24/2021 1034   CO2 23 07/24/2021 1034   BUN 15 07/24/2021 1034   CREATININE 0.72 07/24/2021 1034      Component Value Date/Time   CALCIUM 9.3 07/24/2021 1034   ALKPHOS 85  07/24/2021 1034   AST 11 (L) 07/24/2021 1034   ALT <5 07/24/2021 1034   BILITOT 0.4 07/24/2021 1034       RADIOGRAPHIC STUDIES: No results found.  ASSESSMENT AND PLAN: This is a an 86 years old white female who originally from [place] with metastatic renal cell carcinoma presented with large left renal mass in addition to left hemithorax pleural-based metastasis as well as left hilar and mediastinal lymphadenopathy diagnosed in 2022.  Her molecular studies showed no actionable mutations and PD-L1 expression was negative. The patient had MRI of the brain performed recently that showed no evidence of metastatic disease to the brain. She started systemic treatment with immunotherapy with ipilimumab 1 mg/KG as well as nivolumab 3 mg/KG every 3 weeks status post 3 cycles.  She has intermittent pain on the left side of the chest from her neoplasm and pleural effusion. The patient continues to tolerate this treatment well except for arthralgia. She is currently on treatment with Tylenol and Percocet with mild improvement in her symptoms. I recommended for her to proceed with cycle #4 today as planned. I will see her back for follow-up visit in 3 weeks for evaluation with repeat CT scan of the chest, abdomen and pelvis for restaging of her disease.  If she  has no evidence for disease progression, she may be considered for maintenance treatment with nivolumab every 4 weeks. The patient was advised to call immediately if she has any concerning symptoms in the interval.  The patient voices understanding of current disease status and treatment options and is in agreement with the current care plan. The total time spent in the appointment was 35 minutes.  All questions were answered. The patient knows to call the clinic with any problems, questions or concerns. We can certainly see the patient much sooner if necessary.   Disclaimer: This note was dictated with voice recognition software. Similar sounding  words can inadvertently be transcribed and may not be corrected upon review.

## 2021-07-24 NOTE — Progress Notes (Signed)
Nutrition Follow-up:  Patient with metastatic renal cell carcinoma. She is receiving ipilimumab/nivolumab.   Met with patient during infusion. Interpretor present this afternoon for visit. Patient reports she is not doing well today due to body aches from arthritis. Interpretor reports patient recently had flu and covid booster vaccines. Patient reports her appetite has not been good. She is drinking Ensure High Protein (160 kcal, 16 g protein). Patient reports they are too sweet and can only tolerate one daily. She does not drink milk as she is lactose intolerant. Her son has been cooking for her, reports eating a good dinner. She denies nausea, vomiting, diarrhea, constipation.    Medications: Lotensin, protonix  Labs: Na 128  Anthropometrics: Weight 106 lb 3.2 oz today decreased 3.4% (3.7 lbs) in 3 weeks and 6.3% (7.1 lb) over the last 6 weeks. This is significant for time frame.  10/25 - 109 lb 14.4 oz 10/4 - 113 lb 4.8 oz    NUTRITION DIAGNOSIS: Unintentional weight loss ongoing   INTERVENTION:  Encouraged small frequent meals and snacks high in calories, high in protein Recommend increasing oral supplement to 2x/day as tolerated as well as switching to Ensure Enlive/equivalent for increased calories and protein - coupons provided Suggested mixing supplement with lactose free, whole fat milk ([employer], [employer]k) to cut sweetness  Per interpretor, appetite stimulant discussed at MD visit - due to side effects family decided against at this time. Patient has contact information   MONITORING, EVALUATION, GOAL: weight trends, intake   NEXT VISIT: Tuesday December 6 during infusion

## 2021-07-28 ENCOUNTER — Other Ambulatory Visit: Payer: Self-pay | Admitting: Internal Medicine

## 2021-07-30 ENCOUNTER — Encounter: Payer: Self-pay | Admitting: Internal Medicine

## 2021-07-30 ENCOUNTER — Other Ambulatory Visit: Payer: Self-pay | Admitting: Internal Medicine

## 2021-07-30 MED ORDER — OXYCODONE-ACETAMINOPHEN 5-325 MG PO TABS: 1.0000 | ORAL_TABLET | Freq: Three times a day (TID) | ORAL | 0 refills | Status: DC | PRN

## 2021-07-30 MED ORDER — PROCHLORPERAZINE MALEATE 10 MG PO TABS
10.0000 mg | ORAL_TABLET | Freq: Four times a day (QID) | ORAL | 0 refills | Status: DC | PRN
Start: 2021-07-30 — End: 2021-08-20

## 2021-08-10 ENCOUNTER — Ambulatory Visit (HOSPITAL_COMMUNITY)
Admission: RE | Admit: 2021-08-10 | Discharge: 2021-08-10 | Disposition: A | Discharge status: Home / Self Care | Payer: Medicaid Other | Source: Ambulatory Visit | Attending: Internal Medicine | Admitting: Internal Medicine

## 2021-08-10 ENCOUNTER — Other Ambulatory Visit: Payer: Self-pay

## 2021-08-10 DIAGNOSIS — C642 Malignant neoplasm of left kidney, except renal pelvis: Secondary | ICD-10-CM | POA: Insufficient documentation

## 2021-08-10 MED ORDER — SODIUM CHLORIDE (PF) 0.9 % IJ SOLN
INTRAMUSCULAR | Status: AC
  Filled 2021-08-10: qty 50

## 2021-08-10 MED ORDER — IOHEXOL 350 MG/ML SOLN
75.0000 mL | Freq: Once | INTRAVENOUS | Status: AC | PRN
  Administered 2021-08-10: 75 mL via INTRAVENOUS

## 2021-08-14 ENCOUNTER — Inpatient Hospital Stay: Payer: Medicaid Other

## 2021-08-14 ENCOUNTER — Encounter: Payer: Self-pay | Admitting: Internal Medicine

## 2021-08-14 ENCOUNTER — Inpatient Hospital Stay: Payer: Medicaid Other | Attending: Internal Medicine | Admitting: Internal Medicine

## 2021-08-14 ENCOUNTER — Other Ambulatory Visit: Payer: Self-pay

## 2021-08-14 ENCOUNTER — Telehealth: Payer: Self-pay | Admitting: Pharmacist

## 2021-08-14 ENCOUNTER — Other Ambulatory Visit (HOSPITAL_COMMUNITY): Payer: Self-pay

## 2021-08-14 ENCOUNTER — Inpatient Hospital Stay: Payer: Medicaid Other | Admitting: Dietician

## 2021-08-14 VITALS — BP 147/78 | HR 82 | Temp 97.4°F | Resp 18 | Ht 61.0 in | Wt 99.6 lb

## 2021-08-14 DIAGNOSIS — C642 Malignant neoplasm of left kidney, except renal pelvis: Secondary | ICD-10-CM

## 2021-08-14 DIAGNOSIS — C771 Secondary and unspecified malignant neoplasm of intrathoracic lymph nodes: Secondary | ICD-10-CM | POA: Diagnosis not present

## 2021-08-14 DIAGNOSIS — Z79899 Other long term (current) drug therapy: Secondary | ICD-10-CM | POA: Insufficient documentation

## 2021-08-14 DIAGNOSIS — C782 Secondary malignant neoplasm of pleura: Secondary | ICD-10-CM | POA: Diagnosis not present

## 2021-08-14 DIAGNOSIS — Z5112 Encounter for antineoplastic immunotherapy: Secondary | ICD-10-CM | POA: Insufficient documentation

## 2021-08-14 LAB — CBC WITH DIFFERENTIAL (CANCER CENTER ONLY)
Abs Immature Granulocytes: 0.09 10*3/uL — ABNORMAL HIGH (ref 0.00–0.07)
Basophils Absolute: 0.1 10*3/uL (ref 0.0–0.1)
Basophils Relative: 0 %
Eosinophils Absolute: 0.1 10*3/uL (ref 0.0–0.5)
Eosinophils Relative: 0 %
HCT: 33.5 % — ABNORMAL LOW (ref 36.0–46.0)
Hemoglobin: 10.8 g/dL — ABNORMAL LOW (ref 12.0–15.0)
Immature Granulocytes: 1 %
Lymphocytes Relative: 12 %
Lymphs Abs: 2 10*3/uL (ref 0.7–4.0)
MCH: 27.1 pg (ref 26.0–34.0)
MCHC: 32.2 g/dL (ref 30.0–36.0)
MCV: 84 fL (ref 80.0–100.0)
Monocytes Absolute: 2.1 10*3/uL — ABNORMAL HIGH (ref 0.1–1.0)
Monocytes Relative: 12 %
Neutro Abs: 12.9 10*3/uL — ABNORMAL HIGH (ref 1.7–7.7)
Neutrophils Relative %: 75 %
Platelet Count: 306 10*3/uL (ref 150–400)
RBC: 3.99 MIL/uL (ref 3.87–5.11)
RDW: 14.6 % (ref 11.5–15.5)
WBC Count: 17.2 10*3/uL — ABNORMAL HIGH (ref 4.0–10.5)
nRBC: 0 % (ref 0.0–0.2)

## 2021-08-14 LAB — CMP (CANCER CENTER ONLY)
ALT: <5 U/L (ref 0–44)
AST: 11 U/L — ABNORMAL LOW (ref 15–41)
Albumin: 3.3 g/dL — ABNORMAL LOW (ref 3.5–5.0)
Alkaline Phosphatase: 82 U/L (ref 38–126)
Anion gap: 14 (ref 5–15)
BUN: 15 mg/dL (ref 8–23)
CO2: 21 mmol/L — ABNORMAL LOW (ref 22–32)
Calcium: 9.4 mg/dL (ref 8.9–10.3)
Chloride: 97 mmol/L — ABNORMAL LOW (ref 98–111)
Creatinine: 0.7 mg/dL (ref 0.44–1.00)
GFR, Estimated: >60 mL/min (ref >60)
Glucose, Bld: 114 mg/dL — ABNORMAL HIGH (ref 70–99)
Potassium: 4.2 mmol/L (ref 3.5–5.1)
Sodium: 132 mmol/L — ABNORMAL LOW (ref 135–145)
Total Bilirubin: 0.5 mg/dL (ref 0.3–1.2)
Total Protein: 7.4 g/dL (ref 6.5–8.1)

## 2021-08-14 LAB — TSH: TSH: 1.442 u[IU]/mL (ref 0.308–3.960)

## 2021-08-14 MED ORDER — CABOMETYX 40 MG PO TABS
40.0000 mg | ORAL_TABLET | Freq: Every day | ORAL | 2 refills | Status: DC
  Filled 2021-08-14 (×2): qty 30, 30d supply, fill #0
  Filled 2021-09-04: qty 30, 30d supply, fill #1
  Filled 2021-10-05: qty 30, 30d supply, fill #2

## 2021-08-14 NOTE — Progress Notes (Signed)
[facility] [phone]   Fax:([phone]  OFFICE PROGRESS NOTE  [name], MD [address] [place] [address]  DIAGNOSIS: Metastatic renal cell carcinoma presented with large left renal mass in addition to significant left hemothorax pleural-based metastasis as well as left hilar and mediastinal lymphadenopathy diagnosed in August 2022.  Biomarker Findings Microsatellite status - MS-Stable Tumor Mutational Burden - 2 Muts/Mb Genomic Findings For a complete list of the genes assayed, please refer to the Appendix. MTAP loss CDKN2A/B CDKN2A loss 8 Disease relevant genes with no reportable Alteratio  PD-L1 expression 0%  PRIOR THERAPY: Systemic immunotherapy with ipilimumab 1 mg/KG in addition to nivolumab 3 mg/KG every 3 weeks for 4 cycles followed by maintenance treatment with nivolumab.  Status post 3 cycles.  First dose started on 05/22/2021.  This treatment was discontinued secondary to disease progression.   CURRENT THERAPY: Opdivo 480 Mg IV every 4 weeks with Cabometyx 40 mg p.o. daily.  First dose August 21, 2021.  INTERVAL HISTORY: Gail Fitzgerald 86 y.o. female returns to the clinic today for follow-up visit accompanied by her daughter-in-law.  The patient is feeling fine today with no concerning complaints except for the pain on the left side of the chest with radiation to the left shoulder.  She denied having any shortness of breath, cough or hemoptysis.  She denied having any fever or chills.  She has no nausea, vomiting, diarrhea or constipation.  She tolerated the previous 4 cycles of her treatment with ipilimumab and nivolumab fairly well.  She had repeat CT scan of the chest, abdomen and pelvis performed recently and she is here for evaluation and discussion of her scan results.  MEDICAL HISTORY: Past Medical History:  Diagnosis Date   Hypertension     ALLERGIES:  has no active allergies.  MEDICATIONS:   Current Outpatient Medications  Medication Sig Dispense Refill   Aflibercept (EYLEA IO) Inject 1 Dose into the eye every 30 (thirty) days. IO left eye monthly     aspirin EC 81 MG tablet Take 81 mg by mouth every evening. Swallow whole.     benazepril (LOTENSIN) 20 MG tablet Take 20 mg by mouth daily.     LORazepam (ATIVAN) 0.5 MG tablet 1 tablet p.o. 30 minutes before the MRI.  May repeat once if needed. 2 tablet 0   Magnesium 400 MG CAPS Take 400 mg by mouth See admin instructions. Takes 1 tablet (400 mg) by mouth once daily 10 days on and 10 days off.     metoprolol succinate (TOPROL-XL) 100 MG 24 hr tablet Take 50 mg by mouth in the morning and at bedtime.     OVER THE COUNTER MEDICATION Take 1 tablet by mouth daily as needed (for stomach discomfort). Allochol - (Herbal Supplement) Multivitamin with : Magnesium, potassium, garlic and charcoal. (Russian Medication)     oxyCODONE-acetaminophen (PERCOCET/ROXICET) 5-325 MG tablet Take 1 tablet by mouth every 8 (eight) hours as needed for severe pain. 30 tablet 0   pantoprazole (PROTONIX) 40 MG tablet TAKE 1 TABLET BY MOUTH EVERY DAY 30 tablet 1   prochlorperazine (COMPAZINE) 10 MG tablet Take 1 tablet (10 mg total) by mouth every 6 (six) hours as needed for nausea or vomiting. 30 tablet 0   simvastatin (ZOCOR) 20 MG tablet Take 20 mg by mouth every evening.     UNABLE TO FIND Take 1 tablet by mouth daily as needed (chest pain (angina)). Med Name: Townsend Roger (  Menthyl isovalerate and menthol) (Russian Medication)     ibuprofen (ADVIL) 200 MG tablet Take 400 mg by mouth every 8 (eight) hours as needed (pain.). (Patient not taking: Reported on 08/14/2021)     No current facility-administered medications for this visit.    SURGICAL HISTORY:  Past Surgical History:  Procedure Laterality Date   IR THORACENTESIS ASP PLEURAL SPACE W/IMG GUIDE  04/05/2021   THORACENTESIS N/A 04/12/2021   Procedure: Mathews Robinsons;  Surgeon: [name], DO;  Location:  [facility];  Service: Pulmonary;  Laterality: N/A;    REVIEW OF SYSTEMS:  Constitutional: positive for fatigue and weight loss Eyes: negative Ears, nose, mouth, throat, and face: negative Respiratory: positive for pleurisy/chest pain Cardiovascular: negative Gastrointestinal: negative Genitourinary:negative Integument/breast: negative Hematologic/lymphatic: negative Musculoskeletal:positive for arthralgias Neurological: negative Behavioral/Psych: negative Endocrine: negative Allergic/Immunologic: negative   PHYSICAL EXAMINATION: General appearance: alert, cooperative, fatigued, and no distress Head: Normocephalic, without obvious abnormality, atraumatic Neck: no adenopathy, no JVD, supple, symmetrical, trachea midline, and thyroid not enlarged, symmetric, no tenderness/mass/nodules Lymph nodes: Cervical, supraclavicular, and axillary nodes normal. Resp: diminished breath sounds LLL and dullness to percussion LLL Back: symmetric, no curvature. ROM normal. No CVA tenderness. Cardio: regular rate and rhythm, S1, S2 normal, no murmur, click, rub or gallop GI: soft, non-tender; bowel sounds normal; no masses,  no organomegaly Extremities: extremities normal, atraumatic, no cyanosis or edema Neurologic: Alert and oriented X 3, normal strength and tone. Normal symmetric reflexes. Normal coordination and gait  ECOG PERFORMANCE STATUS: 1 - Symptomatic but completely ambulatory  Blood pressure (!) 147/78, pulse 82, temperature (!) 97.4 F (36.3 C), temperature source Tympanic, resp. rate 18, height '5\' 1"'  (1.549 m), weight 99 lb 9.6 oz (45.2 kg), SpO2 98 %.  LABORATORY DATA: Lab Results  Component Value Date   WBC 17.2 (H) 08/14/2021   HGB 10.8 (L) 08/14/2021   HCT 33.5 (L) 08/14/2021   MCV 84.0 08/14/2021   PLT 306 08/14/2021      Chemistry      Component Value Date/Time   NA 128 (L) 07/24/2021 1034   K 4.4 07/24/2021 1034   CL 96 (L) 07/24/2021 1034   CO2 23 07/24/2021 1034    BUN 15 07/24/2021 1034   CREATININE 0.72 07/24/2021 1034      Component Value Date/Time   CALCIUM 9.3 07/24/2021 1034   ALKPHOS 85 07/24/2021 1034   AST 11 (L) 07/24/2021 1034   ALT <5 07/24/2021 1034   BILITOT 0.4 07/24/2021 1034       RADIOGRAPHIC STUDIES: CT Chest W Contrast  Result Date: 08/13/2021 CLINICAL DATA:  Metastatic renal cell carcinoma with left renal mass and left pleural metastasis/thoracic adenopathy. Diagnosed in August. On immunotherapy. EXAM: CT CHEST, ABDOMEN, AND PELVIS WITH CONTRAST TECHNIQUE: Multidetector CT imaging of the chest, abdomen and pelvis was performed following the standard protocol during bolus administration of intravenous contrast. CONTRAST:  17m OMNIPAQUE IOHEXOL 350 MG/ML SOLN COMPARISON:  04/13/2021 PET. FINDINGS: CT CHEST FINDINGS Cardiovascular: Aortic atherosclerosis. Tortuous thoracic aorta. Normal heart size, without pericardial effusion. Three vessel coronary artery calcification. No central pulmonary embolism, on this non-dedicated study. Mediastinum/Nodes: Left-sided thyroid nodule of 1.3 cm. Not clinically significant; no follow-up imaging recommended (ref: J Am Coll Radiol. 2015 Feb;12(2): 143-50) No supraclavicular adenopathy. No mediastinal or hilar adenopathy. Tiny hiatal hernia. Lungs/Pleura: Circumferential left-sided pleural-based tumor is increased. Example anteromedial left-sided component measures 4.6 x 2.5 cm on 45/7 versus 2.0 x 1.2 cm at the same level on the prior PET (when remeasured).  Left apical pleural based tumor measures 11.2 x 4.3 cm on 18/7 versus 8.2 x 2.2 cm on the prior PET (when remeasured). Minimal inferior left pleural fluid, similar. No pulmonary parenchymal nodule or mass. Musculoskeletal: No acute osseous abnormality. CT ABDOMEN PELVIS FINDINGS Hepatobiliary: Normal liver. Gallstones up to 1.7 cm without acute cholecystitis or biliary duct dilatation. Pancreas: Normal, without mass or ductal dilatation. Spleen:  Normal in size, without focal abnormality. Adrenals/Urinary Tract: Mild bilateral adrenal thickening. Normal right kidney. Left renal upper and interpolar cysts including up to 4.3 cm. Inter/lower pole left renal complex mass demonstrates enhancement compared to the prior noncontrast CT from PET. Example 4.3 x 3.4 cm on 120/7. Similar to 5.0 x 3.5 cm on the prior PET. Contiguous more caudal component measures 3.9 cm on 126/7 versus 3.8 cm on the prior PET (when remeasured). No hydronephrosis. Normal urinary bladder. Stomach/Bowel: Normal remainder of the stomach. Normal colon and terminal ileum. Normal small bowel. Vascular/Lymphatic: Aortic atherosclerosis. Patent renal veins. No abdominopelvic adenopathy. Reproductive: Uterine fundal 1.1 cm fibroid.  No adnexal mass. Other: No significant free fluid. Musculoskeletal: Osteopenia. Subcentimeter sclerotic lesion in the right iliac is likely a bone island. IMPRESSION: 1. Since the PET of 04/13/2021, significant progression of circumferential left-sided pleural tumor. 2. Similar size of a solid left renal mass since prior PET. 3. No thoracic adenopathy to correspond to hypermetabolism on prior PET. 4. No new sites of disease identified. 5. Cholelithiasis. 6. Uterine fundal fibroid. 7. Coronary artery atherosclerosis. Aortic Atherosclerosis (ICD10-I70.0). 8.  Tiny hiatal hernia. Electronically Signed   By: Abigail Miyamoto M.D.   On: 08/13/2021 12:21   CT Abdomen Pelvis W Contrast  Result Date: 08/13/2021 CLINICAL DATA:  Metastatic renal cell carcinoma with left renal mass and left pleural metastasis/thoracic adenopathy. Diagnosed in August. On immunotherapy. EXAM: CT CHEST, ABDOMEN, AND PELVIS WITH CONTRAST TECHNIQUE: Multidetector CT imaging of the chest, abdomen and pelvis was performed following the standard protocol during bolus administration of intravenous contrast. CONTRAST:  88m OMNIPAQUE IOHEXOL 350 MG/ML SOLN COMPARISON:  04/13/2021 PET. FINDINGS: CT CHEST  FINDINGS Cardiovascular: Aortic atherosclerosis. Tortuous thoracic aorta. Normal heart size, without pericardial effusion. Three vessel coronary artery calcification. No central pulmonary embolism, on this non-dedicated study. Mediastinum/Nodes: Left-sided thyroid nodule of 1.3 cm. Not clinically significant; no follow-up imaging recommended (ref: J Am Coll Radiol. 2015 Feb;12(2): 143-50) No supraclavicular adenopathy. No mediastinal or hilar adenopathy. Tiny hiatal hernia. Lungs/Pleura: Circumferential left-sided pleural-based tumor is increased. Example anteromedial left-sided component measures 4.6 x 2.5 cm on 45/7 versus 2.0 x 1.2 cm at the same level on the prior PET (when remeasured). Left apical pleural based tumor measures 11.2 x 4.3 cm on 18/7 versus 8.2 x 2.2 cm on the prior PET (when remeasured). Minimal inferior left pleural fluid, similar. No pulmonary parenchymal nodule or mass. Musculoskeletal: No acute osseous abnormality. CT ABDOMEN PELVIS FINDINGS Hepatobiliary: Normal liver. Gallstones up to 1.7 cm without acute cholecystitis or biliary duct dilatation. Pancreas: Normal, without mass or ductal dilatation. Spleen: Normal in size, without focal abnormality. Adrenals/Urinary Tract: Mild bilateral adrenal thickening. Normal right kidney. Left renal upper and interpolar cysts including up to 4.3 cm. Inter/lower pole left renal complex mass demonstrates enhancement compared to the prior noncontrast CT from PET. Example 4.3 x 3.4 cm on 120/7. Similar to 5.0 x 3.5 cm on the prior PET. Contiguous more caudal component measures 3.9 cm on 126/7 versus 3.8 cm on the prior PET (when remeasured). No hydronephrosis. Normal urinary bladder. Stomach/Bowel: Normal remainder  of the stomach. Normal colon and terminal ileum. Normal small bowel. Vascular/Lymphatic: Aortic atherosclerosis. Patent renal veins. No abdominopelvic adenopathy. Reproductive: Uterine fundal 1.1 cm fibroid.  No adnexal mass. Other: No  significant free fluid. Musculoskeletal: Osteopenia. Subcentimeter sclerotic lesion in the right iliac is likely a bone island. IMPRESSION: 1. Since the PET of 04/13/2021, significant progression of circumferential left-sided pleural tumor. 2. Similar size of a solid left renal mass since prior PET. 3. No thoracic adenopathy to correspond to hypermetabolism on prior PET. 4. No new sites of disease identified. 5. Cholelithiasis. 6. Uterine fundal fibroid. 7. Coronary artery atherosclerosis. Aortic Atherosclerosis (ICD10-I70.0). 8.  Tiny hiatal hernia. Electronically Signed   By: Abigail Miyamoto M.D.   On: 08/13/2021 12:21    ASSESSMENT AND PLAN: This is a an 86 years old white female who originally from [place] with metastatic renal cell carcinoma presented with large left renal mass in addition to left hemithorax pleural-based metastasis as well as left hilar and mediastinal lymphadenopathy diagnosed in 2022.  Her molecular studies showed no actionable mutations and PD-L1 expression was negative. The patient had MRI of the brain performed recently that showed no evidence of metastatic disease to the brain. She started systemic treatment with immunotherapy with ipilimumab 1 mg/KG as well as nivolumab 3 mg/KG every 3 weeks status post 4 cycles.  This treatment was discontinued secondary to disease progression. She had repeat CT scan of the chest, abdomen pelvis performed recently.  I personally and independently reviewed the scan images and discussed the results with the patient and her daughter-in-law today. Unfortunately her scan showed significant progression of the circumferential left-sided pleural tumor with similar size of the left renal mass. I had a lengthy discussion with the patient and her daughter-in-law about her condition and treatment options.  I recommended for the patient to discontinue her current treatment with ipilimumab and nivolumab. I discussed with them other treatment options including  treatment with a combination of nivolumab 480 Mg IV every 4 weeks in addition to Cabometyx 40 mg p.o. daily versus treatment with single agent Cabometyx versus palliative care and hospice referral. The patient is interested in treatment and she preferred the first option with the combination of nivolumab and Cabometyx. I discussed with the patient the adverse effect of this treatment and she will see the pharmacist for oral oncolytic for evaluation as well as education and to help her obtaining the medication. She is expected to start the first dose of nivolumab next week. I will see her back for follow-up visit in 2 weeks for evaluation and management of any adverse effect of her treatment. For pain management she will continue on Percocet on as-needed basis.  I will also consider referring her to radiation oncology for palliative radiotherapy to this area. The patient was advised to call immediately if she has any other concerning symptoms in the interval. The patient voices understanding of current disease status and treatment options and is in agreement with the current care plan. The total time spent in the appointment was 40 minutes.  All questions were answered. The patient knows to call the clinic with any problems, questions or concerns. We can certainly see the patient much sooner if necessary.   Disclaimer: This note was dictated with voice recognition software. Similar sounding words can inadvertently be transcribed and may not be corrected upon review.

## 2021-08-14 NOTE — Telephone Encounter (Signed)
Oral Oncology Pharmacist Encounter  Received new prescription for Cabometyx (cabozantinib) for the treatment of metastatic renal cell carcinoma in conjunction with nivolumab, planned duration until disease progression or unacceptable drug toxicity.  CBC w/ Diff and CMP from 08/14/21 assessed, no baseline dose adjustments required at this time. Prescription dose and frequency assessed for appropriateness.  Current medication list in Epic reviewed, no relevant/significant DDIs with Cabometyx identified.  Evaluated chart and no patient barriers to medication adherence noted.   Patient agreement for treatment documented in MD note on 08/14/21.  Prescription has been e-scribed to the [facility] for benefits analysis and approval.  Oral Oncology Clinic will continue to follow for insurance authorization, copayment issues, initial counseling and start date.  Leron Croak, PharmD, BCPS Hematology/Oncology Clinical Pharmacist [facility] [phone] 08/14/2021 1:17 PM

## 2021-08-14 NOTE — Telephone Encounter (Addendum)
Oral Chemotherapy Pharmacist Encounter  I met with patient and patient's daughter in law in clinic today, for overview of: Cabometyx for the treatment of metastatic renal cell carcinoma in conjunction with nivolumab, planned duration until disease progression or unacceptable toxicity.   Counseled patient on administration, dosing, side effects, monitoring, drug-food interactions, safe handling, storage, and disposal.  Patient will take Cabometyx 41m tablets, 1 tablet (449m by mouth once daily on an empty stomach, 1 hour before or 2 hours after a meal.  Patient knows to avoid grapefruit and grapefruit juice.  Cabometyx start date: pending start date of nivolumab infusion  Adverse effects include but are not limited to: diarrhea, nausea, decreased appetite, fatigue, hypertension, hand-foot syndrome, decreased blood counts, and electrolyte abnormalities.  Patient will obtain anti diarrheal and alert the office of 4 or more loose stools above baseline.  Patient informed that Cabometyx should be held at least 3 weeks prior to any scheduled surgery (including dental surgery) and not resumed until at least 2 weeks after major surgery and until adequate wound healing is established.  Reviewed with patient importance of keeping a medication schedule and plan for any missed doses. No barriers to medication adherence identified.  Medication reconciliation performed and medication/allergy list updated.  Insurance authorization for Cabometyx has been obtained. Test claim at the pharmacy revealed copayment $4 for 1st fill of Cabometyx. This will ship from the [facility] 08/15/21 to deliver to patient's home on 08/16/21. Family knows not to start medication until first day of nivolumab infusion.   Patient informed the pharmacy will reach out 5-7 days prior to needing next fill of Cabometyx to coordinate continued medication acquisition to prevent break in therapy.  All questions  answered.  Ms. KuFraiseroiced understanding and appreciation.   Medication education handout given to patient and patient's family. Patient knows to call the office with questions or concerns. Oral Chemotherapy Clinic phone number provided to patient.   ReLeron CroakPharmD, BCPS Hematology/Oncology Clinical Pharmacist [phone]/02/2021 1:20 PM

## 2021-08-16 ENCOUNTER — Telehealth: Payer: Self-pay | Admitting: Internal Medicine

## 2021-08-16 ENCOUNTER — Other Ambulatory Visit: Payer: Self-pay | Admitting: Internal Medicine

## 2021-08-16 NOTE — Telephone Encounter (Signed)
Sch per 12/7 los, pt relative /support person aware

## 2021-08-20 ENCOUNTER — Other Ambulatory Visit: Payer: Self-pay | Admitting: Internal Medicine

## 2021-08-21 ENCOUNTER — Inpatient Hospital Stay: Payer: Medicaid Other

## 2021-08-21 ENCOUNTER — Encounter: Payer: Self-pay | Admitting: Internal Medicine

## 2021-08-21 ENCOUNTER — Other Ambulatory Visit: Payer: Self-pay

## 2021-08-21 VITALS — BP 151/79 | HR 80 | Temp 97.7°F | Resp 17 | Ht 61.0 in | Wt 99.0 lb

## 2021-08-21 DIAGNOSIS — C642 Malignant neoplasm of left kidney, except renal pelvis: Secondary | ICD-10-CM

## 2021-08-21 DIAGNOSIS — Z5112 Encounter for antineoplastic immunotherapy: Secondary | ICD-10-CM | POA: Diagnosis not present

## 2021-08-21 LAB — CBC WITH DIFFERENTIAL (CANCER CENTER ONLY)
Abs Immature Granulocytes: 0.06 10*3/uL (ref 0.00–0.07)
Basophils Absolute: 0.1 10*3/uL (ref 0.0–0.1)
Basophils Relative: 0 %
Eosinophils Absolute: 0.1 10*3/uL (ref 0.0–0.5)
Eosinophils Relative: 0 %
HCT: 33.1 % — ABNORMAL LOW (ref 36.0–46.0)
Hemoglobin: 10.6 g/dL — ABNORMAL LOW (ref 12.0–15.0)
Immature Granulocytes: 0 %
Lymphocytes Relative: 14 %
Lymphs Abs: 2.1 10*3/uL (ref 0.7–4.0)
MCH: 26.9 pg (ref 26.0–34.0)
MCHC: 32 g/dL (ref 30.0–36.0)
MCV: 84 fL (ref 80.0–100.0)
Monocytes Absolute: 2 10*3/uL — ABNORMAL HIGH (ref 0.1–1.0)
Monocytes Relative: 13 %
Neutro Abs: 10.8 10*3/uL — ABNORMAL HIGH (ref 1.7–7.7)
Neutrophils Relative %: 73 %
Platelet Count: 337 10*3/uL (ref 150–400)
RBC: 3.94 MIL/uL (ref 3.87–5.11)
RDW: 14.8 % (ref 11.5–15.5)
WBC Count: 15 10*3/uL — ABNORMAL HIGH (ref 4.0–10.5)
nRBC: 0 % (ref 0.0–0.2)

## 2021-08-21 LAB — CMP (CANCER CENTER ONLY)
ALT: <5 U/L (ref 0–44)
AST: 13 U/L — ABNORMAL LOW (ref 15–41)
Albumin: 3.1 g/dL — ABNORMAL LOW (ref 3.5–5.0)
Alkaline Phosphatase: 83 U/L (ref 38–126)
Anion gap: 12 (ref 5–15)
BUN: 15 mg/dL (ref 8–23)
CO2: 22 mmol/L (ref 22–32)
Calcium: 9.2 mg/dL (ref 8.9–10.3)
Chloride: 95 mmol/L — ABNORMAL LOW (ref 98–111)
Creatinine: 0.67 mg/dL (ref 0.44–1.00)
GFR, Estimated: >60 mL/min (ref >60)
Glucose, Bld: 119 mg/dL — ABNORMAL HIGH (ref 70–99)
Potassium: 4.3 mmol/L (ref 3.5–5.1)
Sodium: 129 mmol/L — ABNORMAL LOW (ref 135–145)
Total Bilirubin: 0.4 mg/dL (ref 0.3–1.2)
Total Protein: 7.3 g/dL (ref 6.5–8.1)

## 2021-08-21 MED ORDER — FAMOTIDINE 20 MG IN NS 100 ML IVPB
20.0000 mg | Freq: Once | INTRAVENOUS | Status: AC
  Administered 2021-08-21: 20 mg via INTRAVENOUS
  Filled 2021-08-21: qty 100

## 2021-08-21 MED ORDER — OXYCODONE-ACETAMINOPHEN 5-325 MG PO TABS: 1.0000 | ORAL_TABLET | Freq: Three times a day (TID) | ORAL | 0 refills | Status: DC | PRN

## 2021-08-21 MED ORDER — SODIUM CHLORIDE 0.9 % IV SOLN: Freq: Once | INTRAVENOUS | Status: AC

## 2021-08-21 MED ORDER — DIPHENHYDRAMINE HCL 50 MG/ML IJ SOLN
25.0000 mg | Freq: Once | INTRAMUSCULAR | Status: AC
  Administered 2021-08-21: 25 mg via INTRAVENOUS
  Filled 2021-08-21: qty 1

## 2021-08-21 MED ORDER — SODIUM CHLORIDE 0.9 % IV SOLN
480.0000 mg | Freq: Once | INTRAVENOUS | Status: AC
  Administered 2021-08-21: 480 mg via INTRAVENOUS
  Filled 2021-08-21: qty 48

## 2021-08-21 MED ORDER — PROCHLORPERAZINE MALEATE 10 MG PO TABS: 10.0000 mg | ORAL_TABLET | Freq: Four times a day (QID) | ORAL | 0 refills | Status: DC | PRN

## 2021-08-21 NOTE — Patient Instructions (Signed)
[facility]  Discharge Instructions: Thank you for choosing [facility] to provide your oncology and hematology care.   If you have a lab appointment with the [facility], please go directly to the [facility] and check in at the registration area.   Wear comfortable clothing and clothing appropriate for easy access to any Portacath or PICC line.   We strive to give you quality time with your provider. You may need to reschedule your appointment if you arrive late (15 or more minutes).  Arriving late affects you and other patients whose appointments are after yours.  Also, if you miss three or more appointments without notifying the office, you may be dismissed from the clinic at the providers discretion.      For prescription refill requests, have your pharmacy contact our office and allow 72 hours for refills to be completed.    Today you received the following chemotherapy and/or immunotherapy agent: Nivolumab (Opdivo).   To help prevent nausea and vomiting after your treatment, we encourage you to take your nausea medication as directed.  BELOW ARE SYMPTOMS THAT SHOULD BE REPORTED IMMEDIATELY: *FEVER GREATER THAN 100.4 F (38 C) OR HIGHER *CHILLS OR SWEATING *NAUSEA AND VOMITING THAT IS NOT CONTROLLED WITH YOUR NAUSEA MEDICATION *UNUSUAL SHORTNESS OF BREATH *UNUSUAL BRUISING OR BLEEDING *URINARY PROBLEMS (pain or burning when urinating, or frequent urination) *BOWEL PROBLEMS (unusual diarrhea, constipation, pain near the anus) TENDERNESS IN MOUTH AND THROAT WITH OR WITHOUT PRESENCE OF ULCERS (sore throat, sores in mouth, or a toothache) UNUSUAL RASH, SWELLING OR PAIN  UNUSUAL VAGINAL DISCHARGE OR ITCHING   Items with * indicate a potential emergency and should be followed up as soon as possible or go to the Emergency Department if any problems should occur.  Please show the CHEMOTHERAPY ALERT CARD or IMMUNOTHERAPY ALERT CARD at  check-in to the Emergency Department and triage nurse.  Should you have questions after your visit or need to cancel or reschedule your appointment, please contact [facility]  Dept: [phone]  and follow the prompts.  Office hours are 8:00 a.m. to 4:30 p.m. Monday - Friday. Please note that voicemails left after 4:00 p.m. may not be returned until the following business day.  We are closed weekends and major holidays. You have access to a nurse at all times for urgent questions. Please call the main number to the clinic Dept: [phone] and follow the prompts.   For any non-urgent questions, you may also contact your provider using MyChart. We now offer e-Visits for anyone 40 and older to request care online for non-urgent symptoms. For details visit [email or web address].   Also download the MyChart app! Go to the app store, search "MyChart", open the app, select [facility], and log in with your MyChart username and password.  Due to Covid, a mask is required upon entering the hospital/clinic. If you do not have a mask, one will be given to you upon arrival. For doctor visits, patients may have 1 support person aged 13 or older with them. For treatment visits, patients cannot have anyone with them due to current Covid guidelines and our immunocompromised population.

## 2021-08-22 NOTE — Progress Notes (Signed)
[facility] OFFICE PROGRESS NOTE  [name], MD [address] [address]  [place] [id number]  DIAGNOSIS: Metastatic renal cell carcinoma presented with large left renal mass in addition to significant left hemothorax pleural-based metastasis as well as left hilar and mediastinal lymphadenopathy diagnosed in August 2022.   Biomarker Findings Microsatellite status - MS-Stable Tumor Mutational Burden - 2 Muts/Mb Genomic Findings For a complete list of the genes assayed, please refer to the Appendix. MTAP loss CDKN2A/B CDKN2A loss 8 Disease relevant genes with no reportable Alteratio   PD-L1 expression 0%  PRIOR THERAPY: Systemic immunotherapy with ipilimumab 1 mg/KG in addition to nivolumab 3 mg/KG every 3 weeks for 4 cycles followed by maintenance treatment with nivolumab.  Status post 3 cycles.  First dose started on 05/22/2021.  This treatment was discontinued secondary to disease progression.  CURRENT THERAPY: Opdivo 480 Mg IV every 4 weeks with Cabometyx 40 mg p.o. daily.  First dose August 21, 2021.   INTERVAL HISTORY: Aliha Diedrich 86 y.o. female returns to the clinic today for a follow-up visit accompanied by her daughter-in-law and [place] interpreter.  The patient is feeling fairly well today without any concerning complaints except for continued arthralgias as well as decreased appetite.  Regarding the arthralgias, the patient states it is somewhat better than it has been.  She states that she no longer has back pain.  She sometimes continues to have left rib pain but she does not have any pain at this time.  She is taking Tylenol arthritis which has helped her. She also is using icy hot patches on her shoulder in the evening.  She states she has had arthralgias since starting immunotherapy. She needs help with her activities of daily living and is wondering if she can have a home health nurse or aid. Regarding her appetite, she is scheduled to see a  member the nutritionist team at her next infusion on 09/18/21. She reports that she is a picky eater and does not like things that are too sweet.  Therefore, she has not had supplemental drinks such as boost/ensure.   She feels like taking a few sips of beer helps stimulate her appetite and she is wondering if that is okay.  She does have some reported insomnia and she reported she would be open to taking Remeron when this was offered to her at her appointment today. The patient was last seen in clinic on 08/14/2021.  The patient had some evidence of disease progression at that time and Dr. Julien Nordmann added Cabometyx to the patient's treatment plan.  The patient started this on 08/21/21 and is tolerating it well without any appreciable adverse side effects.  She denies any fever, chills, and night sweats. She denies any shortness of breath, cough, or hemoptysis. She denies any nausea, vomiting, or diarrhea.  She has mild constipation but prune juice is helpful for her.  She took her Benazepril this morning as prescribed.  She monitors her blood pressure at home twice a day.  She has noticed that her blood pressure has been more elevated with systolic in the [id number] lately.  She denies any headache or visual changes. Denies any rashes or skin changes.  She is here today for evaluation and repeat blood work before undergoing her next cycle of treatment.  MEDICAL HISTORY: Past Medical History:  Diagnosis Date   Hypertension     ALLERGIES:  has no active allergies.  MEDICATIONS:  Current Outpatient Medications  Medication Sig Dispense Refill  Aflibercept (EYLEA IO) Inject 1 Dose into the eye every 30 (thirty) days. IO left eye monthly     aspirin EC 81 MG tablet Take 81 mg by mouth every evening. Swallow whole.     benazepril (LOTENSIN) 20 MG tablet Take 20 mg by mouth daily.     cabozantinib (CABOMETYX) 40 MG tablet Take 1 tablet (40 mg total) by mouth daily. Take on an empty stomach, 1 hour before or 2  hours after meals. 30 tablet 2   ibuprofen (ADVIL) 200 MG tablet Take 400 mg by mouth every 8 (eight) hours as needed (pain.). (Patient not taking: Reported on 08/14/2021)     LORazepam (ATIVAN) 0.5 MG tablet 1 tablet p.o. 30 minutes before the MRI.  May repeat once if needed. 2 tablet 0   Magnesium 400 MG CAPS Take 400 mg by mouth See admin instructions. Takes 1 tablet (400 mg) by mouth once daily 10 days on and 10 days off.     metoprolol succinate (TOPROL-XL) 100 MG 24 hr tablet Take 50 mg by mouth in the morning and at bedtime.     OVER THE COUNTER MEDICATION Take 1 tablet by mouth daily as needed (for stomach discomfort). Allochol - (Herbal Supplement) Multivitamin with : Magnesium, potassium, garlic and charcoal. (Russian Medication)     oxyCODONE-acetaminophen (PERCOCET/ROXICET) 5-325 MG tablet Take 1 tablet by mouth every 8 (eight) hours as needed for severe pain. 30 tablet 0   pantoprazole (PROTONIX) 40 MG tablet TAKE 1 TABLET BY MOUTH EVERY DAY 30 tablet 1   prochlorperazine (COMPAZINE) 10 MG tablet Take 1 tablet (10 mg total) by mouth every 6 (six) hours as needed for nausea or vomiting. 30 tablet 0   simvastatin (ZOCOR) 20 MG tablet Take 20 mg by mouth every evening.     UNABLE TO FIND Take 1 tablet by mouth daily as needed (chest pain (angina)). Med Name: Validol (Menthyl isovalerate and menthol) (Russian Medication)     No current facility-administered medications for this visit.    SURGICAL HISTORY:  Past Surgical History:  Procedure Laterality Date   IR THORACENTESIS ASP PLEURAL SPACE W/IMG GUIDE  04/05/2021   THORACENTESIS N/A 04/12/2021   Procedure: Mathews Robinsons;  Surgeon: [name], DO;  Location: [facility];  Service: Pulmonary;  Laterality: N/A;    REVIEW OF SYSTEMS:   Review of Systems  Constitutional: Positive for fatigue. Positive for decreased appetite and weight loss. Negative for chills and fever.  HENT: Negative for mouth sores, nosebleeds, sore throat and  trouble swallowing.   Eyes: Negative for eye problems and icterus.  Respiratory: Negative for cough, hemoptysis, shortness of breath and wheezing.   Cardiovascular: Negative for chest pain and leg swelling.  Gastrointestinal: Positive for mild constipation. Negative for abdominal pain, diarrhea, nausea and vomiting.  Genitourinary: Negative for bladder incontinence, difficulty urinating, dysuria, frequency and hematuria.   Musculoskeletal: Positive for left sided chest/rib pain (none at this time). Negative for back pain, gait problem, neck pain and neck stiffness.  Skin: Negative for itching and rash.  Neurological: Negative for dizziness, extremity weakness, gait problem, headaches, light-headedness and seizures.  Hematological: Negative for adenopathy. Does not bruise/bleed easily.  Psychiatric/Behavioral: Negative for confusion, depression and sleep disturbance. The patient is not nervous/anxious.     PHYSICAL EXAMINATION:  There were no vitals taken for this visit.  ECOG PERFORMANCE STATUS: 1-2  Physical Exam  Constitutional: Oriented to person, place, and time and thin appearing female, and in no distress. HENT:  Head:  Normocephalic and atraumatic.  Mouth/Throat: Oropharynx is clear and moist. No oropharyngeal exudate.  Eyes: Conjunctivae are normal. Right eye exhibits no discharge. Left eye exhibits no discharge. No scleral icterus.  Neck: Normal range of motion. Neck supple.  Cardiovascular: Normal rate, regular rhythm, normal heart sounds and intact distal pulses.   Pulmonary/Chest: Effort normal and breath sounds normal. No respiratory distress. No wheezes. No rales.  Abdominal: Soft. Bowel sounds are normal. Exhibits no distension and no mass. There is no tenderness.  Musculoskeletal: Normal range of motion. Exhibits no edema.  Lymphadenopathy:    No cervical adenopathy.  Neurological: Alert and oriented to person, place, and time. Exhibits muscle wasting. Gait normal.  Coordination normal.  Skin: Skin is warm and dry. No rash noted. Not diaphoretic. No erythema. No pallor.  Psychiatric: Mood, memory and judgment normal.  Vitals reviewed.  LABORATORY DATA: Lab Results  Component Value Date   WBC 15.0 (H) 08/21/2021   HGB 10.6 (L) 08/21/2021   HCT 33.1 (L) 08/21/2021   MCV 84.0 08/21/2021   PLT 337 08/21/2021      Chemistry      Component Value Date/Time   NA 129 (L) 08/21/2021 1320   K 4.3 08/21/2021 1320   CL 95 (L) 08/21/2021 1320   CO2 22 08/21/2021 1320   BUN 15 08/21/2021 1320   CREATININE 0.67 08/21/2021 1320      Component Value Date/Time   CALCIUM 9.2 08/21/2021 1320   ALKPHOS 83 08/21/2021 1320   AST 13 (L) 08/21/2021 1320   ALT <5 08/21/2021 1320   BILITOT 0.4 08/21/2021 1320       RADIOGRAPHIC STUDIES:  CT Chest W Contrast  Result Date: 08/13/2021 CLINICAL DATA:  Metastatic renal cell carcinoma with left renal mass and left pleural metastasis/thoracic adenopathy. Diagnosed in August. On immunotherapy. EXAM: CT CHEST, ABDOMEN, AND PELVIS WITH CONTRAST TECHNIQUE: Multidetector CT imaging of the chest, abdomen and pelvis was performed following the standard protocol during bolus administration of intravenous contrast. CONTRAST:  70m OMNIPAQUE IOHEXOL 350 MG/ML SOLN COMPARISON:  04/13/2021 PET. FINDINGS: CT CHEST FINDINGS Cardiovascular: Aortic atherosclerosis. Tortuous thoracic aorta. Normal heart size, without pericardial effusion. Three vessel coronary artery calcification. No central pulmonary embolism, on this non-dedicated study. Mediastinum/Nodes: Left-sided thyroid nodule of 1.3 cm. Not clinically significant; no follow-up imaging recommended (ref: J Am Coll Radiol. 2015 Feb;12(2): 143-50) No supraclavicular adenopathy. No mediastinal or hilar adenopathy. Tiny hiatal hernia. Lungs/Pleura: Circumferential left-sided pleural-based tumor is increased. Example anteromedial left-sided component measures 4.6 x 2.5 cm on 45/7 versus  2.0 x 1.2 cm at the same level on the prior PET (when remeasured). Left apical pleural based tumor measures 11.2 x 4.3 cm on 18/7 versus 8.2 x 2.2 cm on the prior PET (when remeasured). Minimal inferior left pleural fluid, similar. No pulmonary parenchymal nodule or mass. Musculoskeletal: No acute osseous abnormality. CT ABDOMEN PELVIS FINDINGS Hepatobiliary: Normal liver. Gallstones up to 1.7 cm without acute cholecystitis or biliary duct dilatation. Pancreas: Normal, without mass or ductal dilatation. Spleen: Normal in size, without focal abnormality. Adrenals/Urinary Tract: Mild bilateral adrenal thickening. Normal right kidney. Left renal upper and interpolar cysts including up to 4.3 cm. Inter/lower pole left renal complex mass demonstrates enhancement compared to the prior noncontrast CT from PET. Example 4.3 x 3.4 cm on 120/7. Similar to 5.0 x 3.5 cm on the prior PET. Contiguous more caudal component measures 3.9 cm on 126/7 versus 3.8 cm on the prior PET (when remeasured). No hydronephrosis. Normal urinary bladder. Stomach/Bowel:  Normal remainder of the stomach. Normal colon and terminal ileum. Normal small bowel. Vascular/Lymphatic: Aortic atherosclerosis. Patent renal veins. No abdominopelvic adenopathy. Reproductive: Uterine fundal 1.1 cm fibroid.  No adnexal mass. Other: No significant free fluid. Musculoskeletal: Osteopenia. Subcentimeter sclerotic lesion in the right iliac is likely a bone island. IMPRESSION: 1. Since the PET of 04/13/2021, significant progression of circumferential left-sided pleural tumor. 2. Similar size of a solid left renal mass since prior PET. 3. No thoracic adenopathy to correspond to hypermetabolism on prior PET. 4. No new sites of disease identified. 5. Cholelithiasis. 6. Uterine fundal fibroid. 7. Coronary artery atherosclerosis. Aortic Atherosclerosis (ICD10-I70.0). 8.  Tiny hiatal hernia. Electronically Signed   By: Abigail Miyamoto M.D.   On: 08/13/2021 12:21   CT Abdomen  Pelvis W Contrast  Result Date: 08/13/2021 CLINICAL DATA:  Metastatic renal cell carcinoma with left renal mass and left pleural metastasis/thoracic adenopathy. Diagnosed in August. On immunotherapy. EXAM: CT CHEST, ABDOMEN, AND PELVIS WITH CONTRAST TECHNIQUE: Multidetector CT imaging of the chest, abdomen and pelvis was performed following the standard protocol during bolus administration of intravenous contrast. CONTRAST:  50m OMNIPAQUE IOHEXOL 350 MG/ML SOLN COMPARISON:  04/13/2021 PET. FINDINGS: CT CHEST FINDINGS Cardiovascular: Aortic atherosclerosis. Tortuous thoracic aorta. Normal heart size, without pericardial effusion. Three vessel coronary artery calcification. No central pulmonary embolism, on this non-dedicated study. Mediastinum/Nodes: Left-sided thyroid nodule of 1.3 cm. Not clinically significant; no follow-up imaging recommended (ref: J Am Coll Radiol. 2015 Feb;12(2): 143-50) No supraclavicular adenopathy. No mediastinal or hilar adenopathy. Tiny hiatal hernia. Lungs/Pleura: Circumferential left-sided pleural-based tumor is increased. Example anteromedial left-sided component measures 4.6 x 2.5 cm on 45/7 versus 2.0 x 1.2 cm at the same level on the prior PET (when remeasured). Left apical pleural based tumor measures 11.2 x 4.3 cm on 18/7 versus 8.2 x 2.2 cm on the prior PET (when remeasured). Minimal inferior left pleural fluid, similar. No pulmonary parenchymal nodule or mass. Musculoskeletal: No acute osseous abnormality. CT ABDOMEN PELVIS FINDINGS Hepatobiliary: Normal liver. Gallstones up to 1.7 cm without acute cholecystitis or biliary duct dilatation. Pancreas: Normal, without mass or ductal dilatation. Spleen: Normal in size, without focal abnormality. Adrenals/Urinary Tract: Mild bilateral adrenal thickening. Normal right kidney. Left renal upper and interpolar cysts including up to 4.3 cm. Inter/lower pole left renal complex mass demonstrates enhancement compared to the prior  noncontrast CT from PET. Example 4.3 x 3.4 cm on 120/7. Similar to 5.0 x 3.5 cm on the prior PET. Contiguous more caudal component measures 3.9 cm on 126/7 versus 3.8 cm on the prior PET (when remeasured). No hydronephrosis. Normal urinary bladder. Stomach/Bowel: Normal remainder of the stomach. Normal colon and terminal ileum. Normal small bowel. Vascular/Lymphatic: Aortic atherosclerosis. Patent renal veins. No abdominopelvic adenopathy. Reproductive: Uterine fundal 1.1 cm fibroid.  No adnexal mass. Other: No significant free fluid. Musculoskeletal: Osteopenia. Subcentimeter sclerotic lesion in the right iliac is likely a bone island. IMPRESSION: 1. Since the PET of 04/13/2021, significant progression of circumferential left-sided pleural tumor. 2. Similar size of a solid left renal mass since prior PET. 3. No thoracic adenopathy to correspond to hypermetabolism on prior PET. 4. No new sites of disease identified. 5. Cholelithiasis. 6. Uterine fundal fibroid. 7. Coronary artery atherosclerosis. Aortic Atherosclerosis (ICD10-I70.0). 8.  Tiny hiatal hernia. Electronically Signed   By: KAbigail MiyamotoM.D.   On: 08/13/2021 12:21     ASSESSMENT/PLAN:  This is a very pleasant 86year old [place]  She has been diagnosed with metastatic renal  cell carcinoma.  She presented with a large left renal mass in addition to left hemithorax pleural-based metastasis as well as left hilar mediastinal lymphadenopathy.  She was diagnosed in 2022.  Her molecular studies show no actionable mutations and her PD-L1 expression is negative.  The patient is currently undergoing treatment with immunotherapy with ipilimumab 1 mg/kg as well as nivolumab 3 mg/kg IV every 3 weeks status post 4 cycles treatment was discontinued due to disease progression.  The patient recently had disease progression with left-sided pleural tumor with similar size of the left renal mass.  Dr. Julien Nordmann recommended adding  Cabometyx 40 mg p.o. daily with her nivolumab IV every 4 weeks.  The patient is interested in this option and she started Cabometyx on 08/21/21.   Labs were reviewed.  Recommend that she continue on the same treatment at the same dose.   Her BP was rechecked and was 161/74 today.  She checks her blood pressure at home twice a day.  She reports some days her systolic blood pressures in the 150s 160s while other times it is within normal limits in the 120s.   She took her benazepril as prescribed today.  I advised the patient to continue checking her blood pressure twice a day and keep a log of her readings.  If her blood pressure is persistently elevated, I asked that she share these readings with her primary care provider for consideration of dose adjustment or addition of other antihypertensives.    Regarding her arthralgias, she noticed improvement in her arthralgias with using Tylenol arthritis.  She also is using IcyHot patches at night.    Regarding her appetite, she is scheduled to meet with the member the nutritionist team at her next infusion on 09/18/2021.   If she does not like the taste or consistency of supplemental drinks, some patients may find it helpful to water this down with ice/water, milk, almond milk, soy milk, etc.  I reviewed this with her.  She also may benefit from making her own protein shakes as well.  Regarding her question if she may take a few sips of alcohol, specifically beer, daily I discussed that Dr. Julien Nordmann and I would not necessarily recommend this from our standpoint.  Discussed if possible, my recommendation would be to minimize and exclude any alcohol as we would like to protect her liver is much as possible while undergoing treatment for malignancy.  As an alternative, I offered her a prescription of Remeron 7.5 mg p.o. at nighttime .  Discussed that while this is an antidepressant some patients find this helpful at stimulating appetite and helping with insomnia.  The  patient is interested in this and I have sent a prescription to her pharmacy I discussed with her that it may take a few weeks for this to start showing benefit   We will see her back for follow-up visit on 09/18/2021 before starting her next cycle of treatment.  The patient is requesting a home health nurse to help her with her activities of daily living.  It sounds like they want a personal care assistant which often is out-of-pocket.  Discussed with her that I am happy to place a referral to home health with a request for nursing assistance to evaluate the patient and assess her needs at home.  The patient states due to her arthralgias and myalgias due to her cancer malignancy and weakness that she struggles with personal care at home.  The patient was advised to call immediately  if she has any concerning symptoms in the interval. The patient voices understanding of current disease status and treatment options and is in agreement with the current care plan. All questions were answered. The patient knows to call the clinic with any problems, questions or concerns. We can certainly see the patient much sooner if necessary       No orders of the defined types were placed in this encounter.    The total time spent in the appointment was 30-39 minutes.   Jadamarie Butson L Allysia Ingles, PA-C 08/22/21

## 2021-08-27 ENCOUNTER — Other Ambulatory Visit: Payer: Self-pay | Admitting: Medical Oncology

## 2021-08-27 DIAGNOSIS — C642 Malignant neoplasm of left kidney, except renal pelvis: Secondary | ICD-10-CM

## 2021-08-28 ENCOUNTER — Inpatient Hospital Stay: Payer: Medicaid Other

## 2021-08-28 ENCOUNTER — Other Ambulatory Visit: Payer: Self-pay

## 2021-08-28 ENCOUNTER — Inpatient Hospital Stay: Payer: Medicaid Other | Admitting: Physician Assistant

## 2021-08-28 VITALS — BP 185/60 | HR 62 | Temp 97.2°F | Wt 96.9 lb

## 2021-08-28 DIAGNOSIS — Z5112 Encounter for antineoplastic immunotherapy: Secondary | ICD-10-CM | POA: Diagnosis not present

## 2021-08-28 DIAGNOSIS — C642 Malignant neoplasm of left kidney, except renal pelvis: Secondary | ICD-10-CM | POA: Diagnosis not present

## 2021-08-28 DIAGNOSIS — R630 Anorexia: Secondary | ICD-10-CM | POA: Diagnosis not present

## 2021-08-28 DIAGNOSIS — R531 Weakness: Secondary | ICD-10-CM | POA: Diagnosis not present

## 2021-08-28 LAB — CMP (CANCER CENTER ONLY)
ALT: 7 U/L (ref 0–44)
AST: 20 U/L (ref 15–41)
Albumin: 3.5 g/dL (ref 3.5–5.0)
Alkaline Phosphatase: 85 U/L (ref 38–126)
Anion gap: 10 (ref 5–15)
BUN: 17 mg/dL (ref 8–23)
CO2: 24 mmol/L (ref 22–32)
Calcium: 8.8 mg/dL — ABNORMAL LOW (ref 8.9–10.3)
Chloride: 96 mmol/L — ABNORMAL LOW (ref 98–111)
Creatinine: 0.59 mg/dL (ref 0.44–1.00)
GFR, Estimated: >60 mL/min (ref >60)
Glucose, Bld: 182 mg/dL — ABNORMAL HIGH (ref 70–99)
Potassium: 4.3 mmol/L (ref 3.5–5.1)
Sodium: 130 mmol/L — ABNORMAL LOW (ref 135–145)
Total Bilirubin: 0.3 mg/dL (ref 0.3–1.2)
Total Protein: 6.9 g/dL (ref 6.5–8.1)

## 2021-08-28 LAB — CBC WITH DIFFERENTIAL (CANCER CENTER ONLY)
Abs Immature Granulocytes: 0.03 10*3/uL (ref 0.00–0.07)
Basophils Absolute: 0.1 10*3/uL (ref 0.0–0.1)
Basophils Relative: 1 %
Eosinophils Absolute: 0.1 10*3/uL (ref 0.0–0.5)
Eosinophils Relative: 1 %
HCT: 35.4 % — ABNORMAL LOW (ref 36.0–46.0)
Hemoglobin: 11.1 g/dL — ABNORMAL LOW (ref 12.0–15.0)
Immature Granulocytes: 0 %
Lymphocytes Relative: 22 %
Lymphs Abs: 2.2 10*3/uL (ref 0.7–4.0)
MCH: 26.5 pg (ref 26.0–34.0)
MCHC: 31.4 g/dL (ref 30.0–36.0)
MCV: 84.5 fL (ref 80.0–100.0)
Monocytes Absolute: 0.9 10*3/uL (ref 0.1–1.0)
Monocytes Relative: 10 %
Neutro Abs: 6.6 10*3/uL (ref 1.7–7.7)
Neutrophils Relative %: 66 %
Platelet Count: 307 10*3/uL (ref 150–400)
RBC: 4.19 MIL/uL (ref 3.87–5.11)
RDW: 15.2 % (ref 11.5–15.5)
WBC Count: 9.9 10*3/uL (ref 4.0–10.5)
nRBC: 0 % (ref 0.0–0.2)

## 2021-08-28 LAB — TSH: TSH: 1.022 u[IU]/mL (ref 0.308–3.960)

## 2021-08-28 MED ORDER — MIRTAZAPINE 7.5 MG PO TABS
7.5000 mg | ORAL_TABLET | Freq: Every day | ORAL | 2 refills | Status: DC
Start: 2021-08-28 — End: 2021-09-20

## 2021-08-31 ENCOUNTER — Emergency Department (HOSPITAL_COMMUNITY): Payer: Medicaid Other

## 2021-08-31 ENCOUNTER — Other Ambulatory Visit: Payer: Self-pay

## 2021-08-31 ENCOUNTER — Emergency Department (HOSPITAL_COMMUNITY)
Admission: EM | Admit: 2021-08-31 | Discharge: 2021-08-31 | Disposition: A | Discharge status: Home / Self Care | Payer: Medicaid Other | Attending: Emergency Medicine | Admitting: Emergency Medicine

## 2021-08-31 ENCOUNTER — Encounter (HOSPITAL_COMMUNITY): Payer: Self-pay

## 2021-08-31 DIAGNOSIS — Z7982 Long term (current) use of aspirin: Secondary | ICD-10-CM | POA: Insufficient documentation

## 2021-08-31 DIAGNOSIS — I10 Essential (primary) hypertension: Secondary | ICD-10-CM | POA: Diagnosis not present

## 2021-08-31 DIAGNOSIS — R519 Headache, unspecified: Secondary | ICD-10-CM | POA: Diagnosis not present

## 2021-08-31 DIAGNOSIS — R0602 Shortness of breath: Secondary | ICD-10-CM | POA: Insufficient documentation

## 2021-08-31 DIAGNOSIS — Z85528 Personal history of other malignant neoplasm of kidney: Secondary | ICD-10-CM | POA: Diagnosis not present

## 2021-08-31 DIAGNOSIS — Z79899 Other long term (current) drug therapy: Secondary | ICD-10-CM | POA: Insufficient documentation

## 2021-08-31 LAB — CBC WITH DIFFERENTIAL/PLATELET
Abs Immature Granulocytes: 0.03 10*3/uL (ref 0.00–0.07)
Basophils Absolute: 0.1 10*3/uL (ref 0.0–0.1)
Basophils Relative: 1 %
Eosinophils Absolute: 0.1 10*3/uL (ref 0.0–0.5)
Eosinophils Relative: 1 %
HCT: 38.4 % (ref 36.0–46.0)
Hemoglobin: 12.2 g/dL (ref 12.0–15.0)
Immature Granulocytes: 0 %
Lymphocytes Relative: 21 %
Lymphs Abs: 2.1 10*3/uL (ref 0.7–4.0)
MCH: 27.2 pg (ref 26.0–34.0)
MCHC: 31.8 g/dL (ref 30.0–36.0)
MCV: 85.5 fL (ref 80.0–100.0)
Monocytes Absolute: 0.9 10*3/uL (ref 0.1–1.0)
Monocytes Relative: 9 %
Neutro Abs: 6.9 10*3/uL (ref 1.7–7.7)
Neutrophils Relative %: 68 %
Platelets: 302 10*3/uL (ref 150–400)
RBC: 4.49 MIL/uL (ref 3.87–5.11)
RDW: 15.2 % (ref 11.5–15.5)
WBC: 10.1 10*3/uL (ref 4.0–10.5)
nRBC: 0 % (ref 0.0–0.2)

## 2021-08-31 LAB — COMPREHENSIVE METABOLIC PANEL
ALT: 12 U/L (ref 0–44)
AST: 27 U/L (ref 15–41)
Albumin: 3.3 g/dL — ABNORMAL LOW (ref 3.5–5.0)
Alkaline Phosphatase: 76 U/L (ref 38–126)
Anion gap: 13 (ref 5–15)
BUN: 14 mg/dL (ref 8–23)
CO2: 22 mmol/L (ref 22–32)
Calcium: 9 mg/dL (ref 8.9–10.3)
Chloride: 96 mmol/L — ABNORMAL LOW (ref 98–111)
Creatinine, Ser: 0.55 mg/dL (ref 0.44–1.00)
GFR, Estimated: >60 mL/min (ref >60)
Glucose, Bld: 101 mg/dL — ABNORMAL HIGH (ref 70–99)
Potassium: 4.4 mmol/L (ref 3.5–5.1)
Sodium: 131 mmol/L — ABNORMAL LOW (ref 135–145)
Total Bilirubin: 0.6 mg/dL (ref 0.3–1.2)
Total Protein: 7 g/dL (ref 6.5–8.1)

## 2021-08-31 MED ORDER — IOHEXOL 300 MG/ML  SOLN
60.0000 mL | Freq: Once | INTRAMUSCULAR | Status: AC | PRN
  Administered 2021-08-31: 16:00:00 60 mL via INTRAVENOUS

## 2021-08-31 MED ORDER — LABETALOL HCL 5 MG/ML IV SOLN
10.0000 mg | Freq: Once | INTRAVENOUS | Status: AC
  Administered 2021-08-31: 15:00:00 10 mg via INTRAVENOUS
  Filled 2021-08-31: qty 4

## 2021-08-31 NOTE — ED Provider Notes (Signed)
Emergency Medicine Provider Triage Evaluation Note  Gail Fitzgerald , a 86 y.o. female  was evaluated in triage.  Pt complains of headache, shortness of breath, numbness and bilateral lower extremities onset this morning.  Patient has history of lung and renal cell carcinoma for which she is on immunotherapy.  She denies chest pain, fever, abdominal pain.   Review of Systems  Positive: As above Negative: As above  Physical Exam  There were no vitals taken for this visit. Gen:   Awake, no distress   Resp:  Normal effort  MSK:   Moves extremities without difficulty  Other:  Symmetrical bilateral lower extremity strength, sensation.   Medical Decision Making  Medically screening exam initiated at 10:36 AM.  Appropriate orders placed.  Gail Fitzgerald was informed that the remainder of the evaluation will be completed by another provider, this initial triage assessment does not replace that evaluation, and the importance of remaining in the ED until their evaluation is complete.     Evlyn Courier, PA-C 08/31/21 1037    Carmin Muskrat, MD 08/31/21 1414

## 2021-08-31 NOTE — ED Provider Notes (Signed)
[facility] EMERGENCY DEPARTMENT Provider Note   CSN: [id number] Arrival date & time: 08/31/21  4431     History Chief Complaint  Patient presents with   Headache   Shortness of Breath    Gail Fitzgerald is a 86 y.o. female.  86 year old female with past medical history of metastatic renal cell carcinoma presents today for evaluation of headache, shortness of breath, numbness to bilateral lower extremities onset this morning.  She is accompanied by her daughter-in-law who is helping to translate.  She reports this morning she woke up and had difficulty catching her breath.  She denies chest pain, lightheadedness, recent illness, cough.  Per daughter-in-law patient was able to ambulate this morning without difficulty.  She reports she had lack of sensation to the posterior calf this morning.  She reports her son was touching the front of the leg which she could feel, but when he touched the back she could not feel it.  She does not have associated vision change, nausea or vomiting with her headache.  Since being seen in triage she reports her headache has resolved, she feels her breathing has improved.  The history is provided by the patient. No language interpreter was used.      Past Medical History:  Diagnosis Date   Hypertension     Patient Active Problem List   Diagnosis Date Noted   Renal cell carcinoma, left ([place]) 05/09/2021   Encounter for antineoplastic immunotherapy 05/09/2021   S/P thoracentesis    Pleural effusion on left 03/10/2021    Past Surgical History:  Procedure Laterality Date   IR THORACENTESIS ASP PLEURAL SPACE W/IMG GUIDE  04/05/2021   THORACENTESIS N/A 04/12/2021   Procedure: Mathews Robinsons;  Surgeon: [name], DO;  Location: [facility];  Service: Pulmonary;  Laterality: N/A;     OB History   No obstetric history on file.     Family History  Problem Relation Age of Onset   Diabetes Neg Hx    Kidney disease Neg Hx      Social History   Tobacco Use   Smoking status: Never   Smokeless tobacco: Never  Substance Use Topics   Alcohol use: Not Currently   Drug use: Not Currently    Home Medications Prior to Admission medications   Medication Sig Start Date End Date Taking? Authorizing Provider  Aflibercept (EYLEA IO) Inject 1 Dose into the eye every 30 (thirty) days. IO left eye monthly    [provider]  aspirin EC 81 MG tablet Take 81 mg by mouth every evening. Swallow whole.    [provider]  benazepril (LOTENSIN) 20 MG tablet Take 20 mg by mouth daily. 06/11/21   [provider]  cabozantinib (CABOMETYX) 40 MG tablet Take 1 tablet (40 mg total) by mouth daily. Take on an empty stomach, 1 hour before or 2 hours after meals. 08/14/21   Curt Bears, MD  ibuprofen (ADVIL) 200 MG tablet Take 400 mg by mouth every 8 (eight) hours as needed (pain.). Patient not taking: Reported on 08/14/2021    [provider]  LORazepam (ATIVAN) 0.5 MG tablet 1 tablet p.o. 30 minutes before the MRI.  May repeat once if needed. 05/22/21   Curt Bears, MD  Magnesium 400 MG CAPS Take 400 mg by mouth See admin instructions. Takes 1 tablet (400 mg) by mouth once daily 10 days on and 10 days off.    [provider]  metoprolol succinate (TOPROL-XL) 100 MG 24 hr  tablet Take 50 mg by mouth in the morning and at bedtime. 03/13/21   [provider]  mirtazapine (REMERON) 7.5 MG tablet Take 1 tablet (7.5 mg total) by mouth at bedtime. 08/28/21   Heilingoetter, Cassandra L, PA-C  OVER THE COUNTER MEDICATION Take 1 tablet by mouth daily as needed (for stomach discomfort). Allochol - (Herbal Supplement) Multivitamin with : Magnesium, potassium, garlic and charcoal. (Russian Medication)    [provider]  oxyCODONE-acetaminophen (PERCOCET/ROXICET) 5-325 MG tablet Take 1 tablet by mouth every 8 (eight) hours as needed for severe pain. 08/21/21   Curt Bears, MD   pantoprazole (PROTONIX) 40 MG tablet TAKE 1 TABLET BY MOUTH EVERY DAY 08/16/21   Curt Bears, MD  prochlorperazine (COMPAZINE) 10 MG tablet Take 1 tablet (10 mg total) by mouth every 6 (six) hours as needed for nausea or vomiting. 08/21/21   Curt Bears, MD  simvastatin (ZOCOR) 20 MG tablet Take 20 mg by mouth every evening.    [provider]  UNABLE TO FIND Take 1 tablet by mouth daily as needed (chest pain (angina)). Med Name: Validol (Menthyl isovalerate and menthol) (Russian Medication)    [provider]    Allergies    Patient has no active allergies.  Review of Systems   Review of Systems  Constitutional:  Negative for chills and fever.  Eyes:  Negative for visual disturbance.  Respiratory:  Positive for shortness of breath.   Cardiovascular:  Negative for chest pain.  Gastrointestinal:  Negative for abdominal pain, nausea and vomiting.  Musculoskeletal:  Negative for gait problem.  Neurological:  Positive for numbness and headaches. Negative for syncope, speech difficulty, weakness and light-headedness.  All other systems reviewed and are negative.  Physical Exam Updated Vital Signs BP (!) 180/80 (BP Location: Right Arm)    Pulse 60    Temp (!) 97.5 F (36.4 C)    Resp 15    SpO2 98%   Physical Exam Vitals and nursing note reviewed.  Constitutional:      General: She is not in acute distress.    Appearance: Normal appearance. She is not ill-appearing.  HENT:     Head: Normocephalic and atraumatic.     Nose: Nose normal.  Eyes:     General: No scleral icterus.    Extraocular Movements: Extraocular movements intact.     Conjunctiva/sclera: Conjunctivae normal.  Cardiovascular:     Rate and Rhythm: Normal rate and regular rhythm.     Pulses: Normal pulses.     Heart sounds: Normal heart sounds.  Pulmonary:     Effort: Pulmonary effort is normal. No respiratory distress.     Breath sounds: No wheezing.     Comments: Decreased lung sounds  left lower base. Abdominal:     General: There is no distension.     Tenderness: There is no abdominal tenderness.  Musculoskeletal:        General: Normal range of motion.     Cervical back: Normal range of motion.     Right lower leg: No edema.     Left lower leg: No edema.     Comments: Patient work of range of motion in bilateral lower extremities.  Symmetrical strength bilateral lower extremities.  Sensation intact and symmetrical in bilateral lower extremities.  Patient ambulated from triage to room without difficulty.  Skin:    General: Skin is warm and dry.  Neurological:     General: No focal deficit present.     Mental Status:  She is alert. Mental status is at baseline.    ED Results / Procedures / Treatments   Labs (all labs ordered are listed, but only abnormal results are displayed) Labs Reviewed  COMPREHENSIVE METABOLIC PANEL - Abnormal; Notable for the following components:      Result Value   Sodium 131 (*)    Chloride 96 (*)    Glucose, Bld 101 (*)    Albumin 3.3 (*)    All other components within normal limits  CBC WITH DIFFERENTIAL/PLATELET    EKG None  Radiology DG Chest 2 View  Result Date: 08/31/2021 CLINICAL DATA:  Shortness of breath EXAM: CHEST - 2 VIEW COMPARISON:  August 2022, CT chest August 10, 2021 FINDINGS: Left hemithorax nodular pleural soft tissue has progressed since the prior radiograph. Small left pleural effusion. Right lung remains clear. Normal heart size. IMPRESSION: Progression of left pleural based disease since August radiograph. Small left pleural effusion. Electronically Signed   By: Macy Mis M.D.   On: 08/31/2021 11:45    Procedures Procedures   Medications Ordered in ED Medications - No data to display  ED Course  I have reviewed the triage vital signs and the nursing notes.  Pertinent labs & imaging results that were available during my care of the patient were reviewed by me and considered in my medical decision  making (see chart for details).    MDM Rules/Calculators/A&P                         86 year old female with past medical history of metastatic renal cell carcinoma presents today for evaluation of headache, shortness of breath, and numbness to bilateral lower extremities.  Since being seen in triage patient reports improvement in her symptoms.  Neuro exam is reassuring and not focal or concerning for acute CVA.  Since this morning her complaint of numbness has since resolved.  Of note even during these symptoms patient ambulated at home without difficulty per daughter-in-law who is at bedside.  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  Initial work-up significant for CBC which is unremarkable, sodium of 131 and chloride of 96 both of which are around her baseline, glucose of 101 and albumin of 3.3 otherwise unremarkable.  EKG without acute ischemic changes.  Chest x-ray significant for increase in left pleural effusion compared to CT scan that was done beginning of this month and chest x-ray from August.  We will repeat CT chest and obtain CT head.   CT head without acute intracranial findings.  CT chest without significant worsening in previously noted hemothorax.  Given patient's shortness of breath improved, O2 sat following ambulation on room air remained greater than 96% patient is appropriate for discharge.  Discussed importance of follow-up with patient's oncologist.  Patient's symptoms could have been the result of new treatment regimen with Opdivo and Cabometyx started 12/13.  Both patient and daughter-in-law voiced understanding and are in agreement with plan.       Final Clinical Impression(s) / ED Diagnoses Final diagnoses:  Shortness of breath  Nonintractable headache, unspecified chronicity pattern, unspecified headache type    Rx / DC Orders ED Discharge Orders     None        Evlyn Courier,  PA-C 08/31/21 1644    Carmin Muskrat, MD 09/03/21 2011

## 2021-08-31 NOTE — Discharge Instructions (Addendum)
Your work-up today was reassuring.  Your CT scan shows fluid on left lung which is stable compared to your CT scan from earlier this month.  Your breathing improved while you are in the emergency room.  You are able to walk from triage into the room without shortness of breath and your oxygen level remained stable.  Your headache improved while you are in the emergency room.  Your CT scan of your head was also without concern.  I recommend you follow-up with your oncologist as this could be a side effect of your new immunotherapy regimen.  Continue to keep a home blood pressure diary and follow-up with your primary care provider for any adjustment to your blood pressure medication regimen.

## 2021-08-31 NOTE — ED Triage Notes (Signed)
Pt./translator she has had a headache with sometimes having a hared time catching her breath. My legs feel like they are not mine.This was this morning

## 2021-09-04 ENCOUNTER — Other Ambulatory Visit (HOSPITAL_COMMUNITY): Payer: Self-pay

## 2021-09-04 ENCOUNTER — Telehealth: Payer: Self-pay | Admitting: Medical Oncology

## 2021-09-04 NOTE — Telephone Encounter (Signed)
12/23 ED w SOB, hypertension and "numbness"   Was told to f/u with [facility] which is in 14 days.  Does Mohamed need to see him sooner?   Per Cassie , I told Luva to keep appt Jan 10th.  LUVA sent message to PCP re HTN.

## 2021-09-11 ENCOUNTER — Other Ambulatory Visit: Payer: Self-pay | Admitting: Internal Medicine

## 2021-09-13 ENCOUNTER — Other Ambulatory Visit (HOSPITAL_COMMUNITY): Payer: Self-pay

## 2021-09-18 ENCOUNTER — Inpatient Hospital Stay: Payer: Medicaid Other

## 2021-09-18 ENCOUNTER — Other Ambulatory Visit: Payer: Self-pay

## 2021-09-18 ENCOUNTER — Ambulatory Visit: Payer: Medicaid Other | Admitting: Dietician

## 2021-09-18 ENCOUNTER — Inpatient Hospital Stay: Payer: Medicaid Other | Attending: Internal Medicine

## 2021-09-18 ENCOUNTER — Encounter: Payer: Self-pay | Admitting: Internal Medicine

## 2021-09-18 ENCOUNTER — Inpatient Hospital Stay: Payer: Medicaid Other | Admitting: Internal Medicine

## 2021-09-18 VITALS — BP 147/65 | HR 61 | Temp 97.6°F | Resp 17 | Wt 94.8 lb

## 2021-09-18 DIAGNOSIS — Z5112 Encounter for antineoplastic immunotherapy: Secondary | ICD-10-CM | POA: Insufficient documentation

## 2021-09-18 DIAGNOSIS — C642 Malignant neoplasm of left kidney, except renal pelvis: Secondary | ICD-10-CM

## 2021-09-18 DIAGNOSIS — Z79899 Other long term (current) drug therapy: Secondary | ICD-10-CM | POA: Diagnosis not present

## 2021-09-18 LAB — CBC WITH DIFFERENTIAL (CANCER CENTER ONLY)
Abs Immature Granulocytes: 0.01 10*3/uL (ref 0.00–0.07)
Basophils Absolute: 0.1 10*3/uL (ref 0.0–0.1)
Basophils Relative: 1 %
Eosinophils Absolute: 0.1 10*3/uL (ref 0.0–0.5)
Eosinophils Relative: 2 %
HCT: 38.6 % (ref 36.0–46.0)
Hemoglobin: 12.5 g/dL (ref 12.0–15.0)
Immature Granulocytes: 0 %
Lymphocytes Relative: 28 %
Lymphs Abs: 1.7 10*3/uL (ref 0.7–4.0)
MCH: 27.4 pg (ref 26.0–34.0)
MCHC: 32.4 g/dL (ref 30.0–36.0)
MCV: 84.5 fL (ref 80.0–100.0)
Monocytes Absolute: 1 10*3/uL (ref 0.1–1.0)
Monocytes Relative: 16 %
Neutro Abs: 3.2 10*3/uL (ref 1.7–7.7)
Neutrophils Relative %: 53 %
Platelet Count: 144 10*3/uL — ABNORMAL LOW (ref 150–400)
RBC: 4.57 MIL/uL (ref 3.87–5.11)
RDW: 18.6 % — ABNORMAL HIGH (ref 11.5–15.5)
WBC Count: 6.1 10*3/uL (ref 4.0–10.5)
nRBC: 0 % (ref 0.0–0.2)

## 2021-09-18 LAB — CMP (CANCER CENTER ONLY)
ALT: 10 U/L (ref 0–44)
AST: 29 U/L (ref 15–41)
Albumin: 3.5 g/dL (ref 3.5–5.0)
Alkaline Phosphatase: 76 U/L (ref 38–126)
Anion gap: 9 (ref 5–15)
BUN: 13 mg/dL (ref 8–23)
CO2: 24 mmol/L (ref 22–32)
Calcium: 8.8 mg/dL — ABNORMAL LOW (ref 8.9–10.3)
Chloride: 93 mmol/L — ABNORMAL LOW (ref 98–111)
Creatinine: 0.62 mg/dL (ref 0.44–1.00)
GFR, Estimated: >60 mL/min (ref >60)
Glucose, Bld: 107 mg/dL — ABNORMAL HIGH (ref 70–99)
Potassium: 3.9 mmol/L (ref 3.5–5.1)
Sodium: 126 mmol/L — ABNORMAL LOW (ref 135–145)
Total Bilirubin: 0.4 mg/dL (ref 0.3–1.2)
Total Protein: 6.6 g/dL (ref 6.5–8.1)

## 2021-09-18 LAB — SAMPLE TO BLOOD BANK

## 2021-09-18 LAB — TSH: TSH: 2.812 u[IU]/mL (ref 0.308–3.960)

## 2021-09-18 MED ORDER — SODIUM CHLORIDE 0.9 % IV SOLN: Freq: Once | INTRAVENOUS | Status: AC

## 2021-09-18 MED ORDER — SODIUM CHLORIDE 0.9 % IV SOLN
480.0000 mg | Freq: Once | INTRAVENOUS | Status: AC
  Administered 2021-09-18: 480 mg via INTRAVENOUS
  Filled 2021-09-18: qty 48

## 2021-09-18 MED ORDER — DIPHENHYDRAMINE HCL 50 MG/ML IJ SOLN
25.0000 mg | Freq: Once | INTRAMUSCULAR | Status: AC
  Administered 2021-09-18: 25 mg via INTRAVENOUS
  Filled 2021-09-18: qty 1

## 2021-09-18 MED ORDER — FAMOTIDINE 20 MG IN NS 100 ML IVPB
20.0000 mg | Freq: Once | INTRAVENOUS | Status: AC
  Administered 2021-09-18: 20 mg via INTRAVENOUS
  Filled 2021-09-18: qty 100

## 2021-09-18 NOTE — Progress Notes (Signed)
Nutrition Follow-up:  Patient currently receiving Opdivo q4 weeks with carbometyx 40 mg po daily.  Met with patient during infusion. She is drinking a cup of tea and eating crackers this afternoon. Interpretor present at visit. Patient reports appetite was good around Christmas with family in town. It was nice having everyone together in the kitchen cooking. Her appetite has not been good since the holidays. Patient reports blood pressure medication that she takes TID cause anorexia. Per interpretor patient is not taking Remeron. Patient asking if she can have a small amount of beer a few times/day. She reports this helps her to feel hungry. Patient has been drinking smoothies with assorted nuts, honey, berries. She is drinking one Ensure High Protein. Patient complains of it being too sweet. Patient agreeable to drinking strawberry Boost during infusion today. Patient denies nausea, vomiting, diarrhea, constipation.   Medications: reviewed  Labs: Na 126, Glucose 107  Anthropometrics: Weights continue to slowly trend down. Patient 94 lb 12.8 oz today   12/20- 96 lb 14.4 oz 12/6 - 99 lb 9.6 oz 11/15 - 106 lb 3.2 oz 10/25 - 109 lb 14.4 oz 10/4 - 113 lb 4.8 oz   Weights have decreased 11% in the last 8 weeks; significant    NUTRITION DIAGNOSIS: Unintentional weight loss ongoing   INTERVENTION:  Continue to encourage high calorie, high protein foods Reviewed strategies for cutting sweetness of ONS and suggested adding to smoothies in the morning. Recommend drinking 2-3 Ensure Plus/equivalent daily Reviewed/reinforced MD recommendations for minimizing/excluding alcohol intake Patient agreeable to having bedtime snack     MONITORING, EVALUATION, GOAL: weight trends, intake    NEXT VISIT: Tuesday February 7 during infusion

## 2021-09-18 NOTE — Progress Notes (Signed)
[facility] [phone]   Fax:([phone]) [phone]  OFFICE PROGRESS NOTE  [name], MD [place] [address] [place] [address]  DIAGNOSIS: Metastatic renal cell carcinoma presented with large left renal mass in addition to significant left hemothorax pleural-based metastasis as well as left hilar and mediastinal lymphadenopathy diagnosed in August 2022.  Biomarker Findings Microsatellite status - MS-Stable Tumor Mutational Burden - 2 Muts/Mb Genomic Findings For a complete list of the genes assayed, please refer to the Appendix. MTAP loss CDKN2A/B CDKN2A loss 8 Disease relevant genes with no reportable Alteratio  PD-L1 expression 0%  PRIOR THERAPY: Systemic immunotherapy with ipilimumab 1 mg/KG in addition to nivolumab 3 mg/KG every 3 weeks for 4 cycles followed by maintenance treatment with nivolumab.  Status post 3 cycles.  First dose started on 05/22/2021.  This treatment was discontinued secondary to disease progression.   CURRENT THERAPY: Opdivo 480 Mg IV every 4 weeks with Cabometyx 40 mg p.o. daily.  First dose August 21, 2021.  Status post 1 month of treatment  INTERVAL HISTORY: Gail Fitzgerald 87 y.o. female returns to the clinic today for follow-up visit accompanied by her daughter-in-law and her [place] interpreter.  The patient continues to complain of increasing fatigue and weakness as well as generalized aching pain.  She is currently on Percocet on as-needed basis.  She also takes Tylenol few times a day.  The pain on her left side of the chest had improved compared to several weeks ago.  She has no current nausea, vomiting, diarrhea or constipation.  She has no shortness of breath, cough or hemoptysis.  She has occasional headache but she was seen at the emergency department recently and CT of the head was unremarkable. She is here today for evaluation before starting cycle #2.  MEDICAL HISTORY: Past Medical History:   Diagnosis Date   Hypertension     ALLERGIES:  has no active allergies.  MEDICATIONS:  Current Outpatient Medications  Medication Sig Dispense Refill   Aflibercept (EYLEA IO) Inject 1 Dose into the eye every 30 (thirty) days. IO left eye monthly     aspirin EC 81 MG tablet Take 81 mg by mouth every evening. Swallow whole.     benazepril (LOTENSIN) 20 MG tablet Take 20 mg by mouth daily.     cabozantinib (CABOMETYX) 40 MG tablet Take 1 tablet (40 mg total) by mouth daily. Take on an empty stomach, 1 hour before or 2 hours after meals. 30 tablet 2   ibuprofen (ADVIL) 200 MG tablet Take 400 mg by mouth every 8 (eight) hours as needed (pain.). (Patient not taking: Reported on 08/14/2021)     LORazepam (ATIVAN) 0.5 MG tablet 1 tablet p.o. 30 minutes before the MRI.  May repeat once if needed. 2 tablet 0   Magnesium 400 MG CAPS Take 400 mg by mouth See admin instructions. Takes 1 tablet (400 mg) by mouth once daily 10 days on and 10 days off.     metoprolol succinate (TOPROL-XL) 100 MG 24 hr tablet Take 50 mg by mouth in the morning and at bedtime.     mirtazapine (REMERON) 7.5 MG tablet Take 1 tablet (7.5 mg total) by mouth at bedtime. 30 tablet 2   OVER THE COUNTER MEDICATION Take 1 tablet by mouth daily as needed (for stomach discomfort). Allochol - (Herbal Supplement) Multivitamin with : Magnesium, potassium, garlic and charcoal. (Russian Medication)     oxyCODONE-acetaminophen (PERCOCET/ROXICET) 5-325 MG tablet Take 1  tablet by mouth every 8 (eight) hours as needed for severe pain. 30 tablet 0   pantoprazole (PROTONIX) 40 MG tablet TAKE 1 TABLET BY MOUTH EVERY DAY 30 tablet 1   prochlorperazine (COMPAZINE) 10 MG tablet Take 1 tablet (10 mg total) by mouth every 6 (six) hours as needed for nausea or vomiting. 30 tablet 0   simvastatin (ZOCOR) 20 MG tablet Take 20 mg by mouth every evening.     UNABLE TO FIND Take 1 tablet by mouth daily as needed (chest pain (angina)). Med Name: Validol (Menthyl  isovalerate and menthol) (Russian Medication)     No current facility-administered medications for this visit.    SURGICAL HISTORY:  Past Surgical History:  Procedure Laterality Date   IR THORACENTESIS ASP PLEURAL SPACE W/IMG GUIDE  04/05/2021   THORACENTESIS N/A 04/12/2021   Procedure: Mathews Robinsons;  Surgeon: [name], DO;  Location: [facility];  Service: Pulmonary;  Laterality: N/A;    REVIEW OF SYSTEMS:  Constitutional: positive for fatigue Eyes: negative Ears, nose, mouth, throat, and face: negative Respiratory: negative Cardiovascular: negative Gastrointestinal: negative Genitourinary:negative Integument/breast: negative Hematologic/lymphatic: negative Musculoskeletal:positive for arthralgias Neurological: positive for headaches Behavioral/Psych: negative Endocrine: negative Allergic/Immunologic: negative   PHYSICAL EXAMINATION: General appearance: alert, cooperative, fatigued, and no distress Head: Normocephalic, without obvious abnormality, atraumatic Neck: no adenopathy, no JVD, supple, symmetrical, trachea midline, and thyroid not enlarged, symmetric, no tenderness/mass/nodules Lymph nodes: Cervical, supraclavicular, and axillary nodes normal. Resp: clear to auscultation bilaterally Back: symmetric, no curvature. ROM normal. No CVA tenderness. Cardio: regular rate and rhythm, S1, S2 normal, no murmur, click, rub or gallop GI: soft, non-tender; bowel sounds normal; no masses,  no organomegaly Extremities: extremities normal, atraumatic, no cyanosis or edema Neurologic: Alert and oriented X 3, normal strength and tone. Normal symmetric reflexes. Normal coordination and gait  ECOG PERFORMANCE STATUS: 1 - Symptomatic but completely ambulatory  Blood pressure (!) 147/65, pulse 61, temperature 97.6 F (36.4 C), temperature source Oral, resp. rate 17, weight 94 lb 12.8 oz (43 kg), SpO2 97 %.  LABORATORY DATA: Lab Results  Component Value Date   WBC 6.1  09/18/2021   HGB 12.5 09/18/2021   HCT 38.6 09/18/2021   MCV 84.5 09/18/2021   PLT 144 (L) 09/18/2021      Chemistry      Component Value Date/Time   NA 126 (L) 09/18/2021 1127   K 3.9 09/18/2021 1127   CL 93 (L) 09/18/2021 1127   CO2 24 09/18/2021 1127   BUN 13 09/18/2021 1127   CREATININE 0.62 09/18/2021 1127      Component Value Date/Time   CALCIUM 8.8 (L) 09/18/2021 1127   ALKPHOS 76 09/18/2021 1127   AST 29 09/18/2021 1127   ALT 10 09/18/2021 1127   BILITOT 0.4 09/18/2021 1127       RADIOGRAPHIC STUDIES: DG Chest 2 View  Result Date: 08/31/2021 CLINICAL DATA:  Shortness of breath EXAM: CHEST - 2 VIEW COMPARISON:  August 2022, CT chest August 10, 2021 FINDINGS: Left hemithorax nodular pleural soft tissue has progressed since the prior radiograph. Small left pleural effusion. Right lung remains clear. Normal heart size. IMPRESSION: Progression of left pleural based disease since August radiograph. Small left pleural effusion. Electronically Signed   By: Macy Mis M.D.   On: 08/31/2021 11:45   CT Head Wo Contrast  Result Date: 08/31/2021 CLINICAL DATA:  New or worsening headache EXAM: CT HEAD WITHOUT CONTRAST TECHNIQUE: Contiguous axial images were obtained from the base of the skull through  the vertex without intravenous contrast. COMPARISON:  None FINDINGS: Brain: Generalized atrophy. Slight asymmetric enlargement of the frontal horn of the LEFT lateral ventricle, could be related to an old periventricular white matter infarct. No midline shift or mass effect. Small vessel chronic ischemic changes of deep cerebral white matter. No intracranial hemorrhage, mass lesion, or evidence of acute infarction. No extra-axial fluid collections. Vascular: No hyperdense vessels. Atherosclerotic calcifications of internal carotid and vertebral arteries at skull base Skull: Demineralized but intact Sinuses/Orbits: Clear Other: N/A IMPRESSION: Atrophy with small vessel chronic ischemic  changes of deep cerebral white matter. Question old periventricular white matter infarct adjacent to frontal horn of LEFT lateral ventricle. No acute intracranial abnormalities. Electronically Signed   By: Lavonia Dana M.D.   On: 08/31/2021 16:09   CT Chest W Contrast  Result Date: 08/31/2021 CLINICAL DATA:  Shortness of breath, pleural effusion, renal cell carcinoma. EXAM: CT CHEST WITH CONTRAST TECHNIQUE: Multidetector CT imaging of the chest was performed during intravenous contrast administration. CONTRAST:  56m OMNIPAQUE IOHEXOL 300 MG/ML  SOLN COMPARISON:  08/10/2021. FINDINGS: Cardiovascular: Atherosclerotic calcification of the aorta and coronary arteries. Ascending aorta measures up to 4.1 cm. Pulmonic trunk is enlarged. Heart size normal. No pericardial effusion. Mediastinum/Nodes: 1.3 cm low-attenuation thyroid nodule. No follow-up recommended. (Ref: J Am Coll Radiol. 2015 Feb;12(2): 143-50).Calcified and noncalcified mediastinal lymph nodes. No pathologically enlarged mediastinal, hilar or axillary lymph nodes. Esophagus is grossly unremarkable. Lungs/Pleura: Extensive nodular and masslike pleural thickening throughout the left hemithorax, as before. 11 mm apical segment right upper lobe nodule (4/21), unchanged. There may a small amount complex loculated pleural fluid at the base of the left hemithorax. Airway is unremarkable. Upper Abdomen: Liver is decreased in attenuation diffusely. Stones in the gallbladder. Adrenal glands are unremarkable. Scarring in the right kidney. Subcentimeter low-attenuation lesion in the right kidney is too small to characterize. 3.8 cm fluid density cyst in left kidney. 4.6 cm low-attenuation lesion off the interpolar left kidney, incompletely visualized. Visualized portions of the spleen, pancreas, stomach and bowel are unremarkable with the exception of a small hiatal. No upper abdominal adenopathy. Musculoskeletal: Degenerative changes in the spine. No worrisome  lytic or sclerotic lesions. IMPRESSION: 1. Extensive pleural metastatic disease in the left hemithorax, as on 08/10/2021. 2. Hepatic steatosis. 3. Cholelithiasis. 4. Ascending aortic aneurysm. 5. Aortic atherosclerosis (ICD10-I70.0). 6. Enlarged pulmonic trunk, indicative of pulmonary arterial hypertension. Electronically Signed   By: MLorin PicketM.D.   On: 08/31/2021 16:16    ASSESSMENT AND PLAN: This is a an 87years old [place] presented with large left renal mass in addition to left hemithorax pleural-based metastasis as well as left hilar and mediastinal lymphadenopathy diagnosed in 2022.  Her molecular studies showed no actionable mutations and PD-L1 expression was negative. The patient had MRI of the brain performed recently that showed no evidence of metastatic disease to the brain. She started systemic treatment with immunotherapy with ipilimumab 1 mg/KG as well as nivolumab 3 mg/KG every 3 weeks status post 4 cycles.  This treatment was discontinued secondary to disease progression. She is currently on treatment with a combination of nivolumab 480 Mg IV every 4 weeks in addition to Cabometyx 40 mg p.o. daily status post 1 cycle. The patient tolerated the first cycle of her treatment well except for the generalized aching pain and arthralgia.  She is currently on Percocet with some improvement but she does not want to take it at  regular basis.  She takes Tylenol in between.  She denied having any current nausea or vomiting. I recommended for her to proceed with cycle #2 of her treatment with nivolumab and Cabometyx today as planned. I will see her back for follow-up visit in 4 weeks for evaluation with repeat CT scan of the chest, abdomen and pelvis for restaging of her disease. For the hyponatremia, I recommended for the patient to restrict her fresh water intake and add a little bit more salt to her diet.  We will continue  to monitor on the upcoming blood work. The patient voices understanding of current disease status and treatment options and is in agreement with the current care plan. The total time spent in the appointment was 35 minutes.  All questions were answered. The patient knows to call the clinic with any problems, questions or concerns. We can certainly see the patient much sooner if necessary.   Disclaimer: This note was dictated with voice recognition software. Similar sounding words can inadvertently be transcribed and may not be corrected upon review.

## 2021-09-18 NOTE — Patient Instructions (Signed)
[facility]  Discharge Instructions: Thank you for choosing [facility] to provide your oncology and hematology care.   If you have a lab appointment with the [facility], please go directly to the [facility] and check in at the registration area.   Wear comfortable clothing and clothing appropriate for easy access to any Portacath or PICC line.   We strive to give you quality time with your provider. You may need to reschedule your appointment if you arrive late (15 or more minutes).  Arriving late affects you and other patients whose appointments are after yours.  Also, if you miss three or more appointments without notifying the office, you may be dismissed from the clinic at the providers discretion.      For prescription refill requests, have your pharmacy contact our office and allow 72 hours for refills to be completed.    Today you received the following chemotherapy and/or immunotherapy agent: Nivolumab (Opdivo)   To help prevent nausea and vomiting after your treatment, we encourage you to take your nausea medication as directed.  BELOW ARE SYMPTOMS THAT SHOULD BE REPORTED IMMEDIATELY: *FEVER GREATER THAN 100.4 F (38 C) OR HIGHER *CHILLS OR SWEATING *NAUSEA AND VOMITING THAT IS NOT CONTROLLED WITH YOUR NAUSEA MEDICATION *UNUSUAL SHORTNESS OF BREATH *UNUSUAL BRUISING OR BLEEDING *URINARY PROBLEMS (pain or burning when urinating, or frequent urination) *BOWEL PROBLEMS (unusual diarrhea, constipation, pain near the anus) TENDERNESS IN MOUTH AND THROAT WITH OR WITHOUT PRESENCE OF ULCERS (sore throat, sores in mouth, or a toothache) UNUSUAL RASH, SWELLING OR PAIN  UNUSUAL VAGINAL DISCHARGE OR ITCHING   Items with * indicate a potential emergency and should be followed up as soon as possible or go to the Emergency Department if any problems should occur.  Please show the CHEMOTHERAPY ALERT CARD or IMMUNOTHERAPY ALERT CARD at  check-in to the Emergency Department and triage nurse.  Should you have questions after your visit or need to cancel or reschedule your appointment, please contact [facility]  Dept: [phone]  and follow the prompts.  Office hours are 8:00 a.m. to 4:30 p.m. Monday - Friday. Please note that voicemails left after 4:00 p.m. may not be returned until the following business day.  We are closed weekends and major holidays. You have access to a nurse at all times for urgent questions. Please call the main number to the clinic Dept: [phone] and follow the prompts.   For any non-urgent questions, you may also contact your provider using MyChart. We now offer e-Visits for anyone 50 and older to request care online for non-urgent symptoms. For details visit [email or web address].   Also download the MyChart app! Go to the app store, search "MyChart", open the app, select [facility], and log in with your MyChart username and password.  Due to Covid, a mask is required upon entering the hospital/clinic. If you do not have a mask, one will be given to you upon arrival. For doctor visits, patients may have 1 support person aged 45 or older with them. For treatment visits, patients cannot have anyone with them due to current Covid guidelines and our immunocompromised population.

## 2021-09-19 ENCOUNTER — Other Ambulatory Visit: Payer: Self-pay | Admitting: Internal Medicine

## 2021-09-20 ENCOUNTER — Other Ambulatory Visit: Payer: Self-pay | Admitting: Physician Assistant

## 2021-09-20 ENCOUNTER — Other Ambulatory Visit: Payer: Self-pay | Admitting: Internal Medicine

## 2021-09-20 DIAGNOSIS — R630 Anorexia: Secondary | ICD-10-CM

## 2021-09-20 MED ORDER — OXYCODONE-ACETAMINOPHEN 5-325 MG PO TABS: 1.0000 | ORAL_TABLET | Freq: Three times a day (TID) | ORAL | 0 refills | Status: DC | PRN

## 2021-09-28 ENCOUNTER — Other Ambulatory Visit: Payer: Self-pay | Admitting: Family Medicine

## 2021-09-28 ENCOUNTER — Other Ambulatory Visit: Payer: Self-pay

## 2021-09-28 DIAGNOSIS — S0990XA Unspecified injury of head, initial encounter: Secondary | ICD-10-CM

## 2021-09-28 DIAGNOSIS — C642 Malignant neoplasm of left kidney, except renal pelvis: Secondary | ICD-10-CM

## 2021-09-28 MED ORDER — PROCHLORPERAZINE MALEATE 10 MG PO TABS: 10.0000 mg | ORAL_TABLET | Freq: Four times a day (QID) | ORAL | 2 refills | Status: DC | PRN

## 2021-10-01 ENCOUNTER — Ambulatory Visit
Admission: RE | Admit: 2021-10-01 | Discharge: 2021-10-01 | Disposition: A | Discharge status: Home / Self Care | Payer: Medicaid Other | Source: Ambulatory Visit | Attending: Family Medicine | Admitting: Family Medicine

## 2021-10-01 DIAGNOSIS — S0990XA Unspecified injury of head, initial encounter: Secondary | ICD-10-CM

## 2021-10-03 ENCOUNTER — Ambulatory Visit
Admission: RE | Admit: 2021-10-03 | Discharge: 2021-10-03 | Disposition: A | Discharge status: Home / Self Care | Payer: Medicaid Other | Source: Ambulatory Visit | Attending: Internal Medicine | Admitting: Internal Medicine

## 2021-10-03 DIAGNOSIS — C642 Malignant neoplasm of left kidney, except renal pelvis: Secondary | ICD-10-CM

## 2021-10-04 ENCOUNTER — Other Ambulatory Visit: Payer: Self-pay | Admitting: Internal Medicine

## 2021-10-04 NOTE — Progress Notes (Signed)
[facility] OFFICE PROGRESS NOTE  [name], MD [place] [address] [place] [address]  DIAGNOSIS: Metastatic renal cell carcinoma presented with large left renal mass in addition to significant left hemothorax pleural-based metastasis as well as left hilar and mediastinal lymphadenopathy diagnosed in August 2022.   Biomarker Findings Microsatellite status - MS-Stable Tumor Mutational Burden - 2 Muts/Mb Genomic Findings For a complete list of the genes assayed, please refer to the Appendix. MTAP loss CDKN2A/B CDKN2A loss 8 Disease relevant genes with no reportable Alteratio   PD-L1 expression 0%  PRIOR THERAPY:  Systemic immunotherapy with ipilimumab 1 mg/KG in addition to nivolumab 3 mg/KG every 3 weeks for 4 cycles followed by maintenance treatment with nivolumab.  Status post 3 cycles.  First dose started on 05/22/2021.  This treatment was discontinued secondary to disease progression.  CURRENT THERAPY: Opdivo 480 Mg IV every 4 weeks with Cabometyx 40 mg p.o. daily.  First dose August 21, 2021. Status post 2 cycles.   INTERVAL HISTORY: Gail Fitzgerald 87 y.o. female returns to the clinic today for a follow-up visit accompanied by her daughter-in-law and [place] interpreter.  The patient is feeling fair today except she is having diffuse joint pain in her knees, shoulder, hands, etc. She previously was having left rib pain from her pleural involvement of her malignancy, which has subsided, but she developed the new diffuse joint pain. She is prescribed oxycodone for the rib pain, which she has been using for her joint pain. She has not been taking this appropriately and sometimes takes 3 at a time, and other times, she does not take it at all. She state the pain medication takes her pain to a 4-5/10. She repeats that the pain medication does not take away the underlying cause. She has some mild low back pain. She fell a few weeks ago and her recent scan  showed a mild compression fracture that is new in the interval.   The patient denies any recent fever, chills, or night sweats.  She had been reporting a decreased appetite and weight loss.  She has been followed by member the nutritionist team.  She was also sent in a prescription for Remeron to help with her decreased appetite and insomnia. She continued to lose another 4 lbs since her last appointment. She does not like the taste of boost/ensure. She is a picky eater so it has been challenging for her family to get her to eat at home. Previously discussed that she can make her own protein drinks if she does not like what is sold commercially.    She denies any shortness of breath, cough, or hemoptysis.  Denies any nausea, vomiting, or diarrhea.  If she has mild constipation she uses prune juice.  She is not having appreciable side effects from the Cabometyx.  The patient recently had a restaging CT scan performed.  The patient is here today for evaluation and to review her scan results before starting cycle #3.     MEDICAL HISTORY: Past Medical History:  Diagnosis Date   Hypertension     ALLERGIES:  has no active allergies.  MEDICATIONS:  Current Outpatient Medications  Medication Sig Dispense Refill   Aflibercept (EYLEA IO) Inject 1 Dose into the eye every 30 (thirty) days. IO left eye monthly     amLODipine (NORVASC) 10 MG tablet Take 10 mg by mouth daily.     aspirin EC 81 MG tablet Take 81 mg by mouth every evening.  Swallow whole.     benazepril (LOTENSIN) 20 MG tablet Take 20 mg by mouth daily.     cabozantinib (CABOMETYX) 40 MG tablet Take 1 tablet (40 mg total) by mouth daily. Take on an empty stomach, 1 hour before or 2 hours after meals. 30 tablet 2   ibuprofen (ADVIL) 200 MG tablet Take 400 mg by mouth every 8 (eight) hours as needed (pain.).     LORazepam (ATIVAN) 0.5 MG tablet 1 tablet p.o. 30 minutes before the MRI.  May repeat once if needed. 2 tablet 0   Magnesium 400 MG  CAPS Take 400 mg by mouth See admin instructions. Takes 1 tablet (400 mg) by mouth once daily 10 days on and 10 days off.     metoprolol succinate (TOPROL-XL) 100 MG 24 hr tablet Take 50 mg by mouth in the morning and at bedtime.     mirtazapine (REMERON) 7.5 MG tablet TAKE 1 TABLET BY MOUTH AT BEDTIME. 90 tablet 1   OVER THE COUNTER MEDICATION Take 1 tablet by mouth daily as needed (for stomach discomfort). Allochol - (Herbal Supplement) Multivitamin with : Magnesium, potassium, garlic and charcoal. (Russian Medication)     oxyCODONE-acetaminophen (PERCOCET/ROXICET) 5-325 MG tablet Take 1 tablet by mouth every 8 (eight) hours as needed for severe pain. 30 tablet 0   pantoprazole (PROTONIX) 40 MG tablet TAKE 1 TABLET BY MOUTH EVERY DAY 30 tablet 1   prochlorperazine (COMPAZINE) 10 MG tablet Take 1 tablet (10 mg total) by mouth every 6 (six) hours as needed for nausea or vomiting. 30 tablet 2   simvastatin (ZOCOR) 20 MG tablet Take 20 mg by mouth every evening.     UNABLE TO FIND Take 1 tablet by mouth daily as needed (chest pain (angina)). Med Name: Validol (Menthyl isovalerate and menthol) (Russian Medication)     No current facility-administered medications for this visit.   Facility-Administered Medications Ordered in Other Visits  Medication Dose Route Frequency Provider Last Rate Last Admin   nivolumab (OPDIVO) 480 mg in sodium chloride 0.9 % 100 mL chemo infusion  480 mg Intravenous Once Curt Bears, MD        SURGICAL HISTORY:  Past Surgical History:  Procedure Laterality Date   IR THORACENTESIS ASP PLEURAL SPACE W/IMG GUIDE  04/05/2021   THORACENTESIS N/A 04/12/2021   Procedure: Mathews Robinsons;  Surgeon: [name], DO;  Location: [facility];  Service: Pulmonary;  Laterality: N/A;    REVIEW OF SYSTEMS:   Review of Systems  Constitutional: Positive for fatigue, decreased appetite, and weight loss.  Negative for chills and fever.  HENT: Negative for mouth sores, nosebleeds,  sore throat and trouble swallowing.   Eyes: Negative for eye problems and icterus.  Respiratory: Negative for cough, hemoptysis, shortness of breath and wheezing.   Cardiovascular: Negative for chest pain and leg swelling.  Gastrointestinal: Negative for abdominal pain, constipation, diarrhea, nausea and vomiting.  Genitourinary: Negative for bladder incontinence, difficulty urinating, dysuria, frequency and hematuria.   Musculoskeletal: Positive for generalized joint pain.  Negative for gait problem, neck pain and neck stiffness.  Skin: Negative for itching and rash.  Neurological: Negative for dizziness, extremity weakness, gait problem, headaches, light-headedness and seizures.  Hematological: Negative for adenopathy. Does not bruise/bleed easily.  Psychiatric/Behavioral: Negative for confusion, depression and sleep disturbance. The patient is not nervous/anxious.     PHYSICAL EXAMINATION:  Blood pressure 140/67, pulse (!) 59, temperature 97.9 F (36.6 C), temperature source Oral, resp. rate 17, height '5\' 1"'  (1.549 m),  weight 90 lb (40.8 kg), SpO2 100 %.  ECOG PERFORMANCE STATUS: 1-2  Physical Exam  Constitutional: Oriented to person, place, and time and thin/cachectic appearing female and in no distress.  HENT:  Head: Normocephalic and atraumatic.  Mouth/Throat: Oropharynx is clear and moist. No oropharyngeal exudate.  Eyes: Conjunctivae are normal. Right eye exhibits no discharge. Left eye exhibits no discharge. No scleral icterus.  Neck: Normal range of motion. Neck supple.  Cardiovascular: Normal rate, regular rhythm, normal heart sounds and intact distal pulses.   Pulmonary/Chest: Effort normal and breath sounds normal. No respiratory distress. No wheezes. No rales.  Abdominal: Soft. Bowel sounds are normal. Exhibits no distension and no mass. There is no tenderness.  Musculoskeletal: Normal range of motion. Exhibits no edema.  Lymphadenopathy:    No cervical adenopathy.   Neurological: Alert and oriented to person, place, and time.  Exhibits muscle wasting.  Gait normal. Coordination normal.  Skin: Skin is warm and dry. No rash noted. Not diaphoretic. No erythema. No pallor.  Psychiatric: Mood, memory and judgment normal.  Vitals reviewed.  LABORATORY DATA: Lab Results  Component Value Date   WBC 7.6 10/16/2021   HGB 12.2 10/16/2021   HCT 35.6 (L) 10/16/2021   MCV 85.4 10/16/2021   PLT 185 10/16/2021      Chemistry      Component Value Date/Time   NA 129 (L) 10/16/2021 1029   K 4.3 10/16/2021 1029   CL 96 (L) 10/16/2021 1029   CO2 23 10/16/2021 1029   BUN 13 10/16/2021 1029   CREATININE 0.56 10/16/2021 1029      Component Value Date/Time   CALCIUM 8.9 10/16/2021 1029   ALKPHOS 117 10/16/2021 1029   AST 28 10/16/2021 1029   ALT 12 10/16/2021 1029   BILITOT 0.5 10/16/2021 1029       RADIOGRAPHIC STUDIES:  CT HEAD WO CONTRAST (5MM)  Result Date: 10/02/2021 CLINICAL DATA:  Head injury 1 week ago. History of renal cell carcinoma EXAM: CT HEAD WITHOUT CONTRAST TECHNIQUE: Contiguous axial images were obtained from the base of the skull through the vertex without intravenous contrast. RADIATION DOSE REDUCTION: This exam was performed according to the departmental dose-optimization program which includes automated exposure control, adjustment of the mA and/or kV according to patient size and/or use of iterative reconstruction technique. COMPARISON:  CT head 08/31/2021 FINDINGS: Brain: Mild atrophy. Left frontal periventricular white matter hypodensity compatible chronic infarct is unchanged. Negative for acute infarct, hemorrhage, mass. No edema in the brain. Vascular: Negative for hyperdense vessel Skull: Negative Sinuses/Orbits: Paranasal sinuses clear. Bilateral cataract extraction Other: None IMPRESSION: No acute abnormality.  Atrophy and mild chronic ischemic changes. Electronically Signed   By: Franchot Gallo M.D.   On: 10/02/2021 14:01   CT  CHEST ABDOMEN PELVIS WO CONTRAST  Result Date: 10/04/2021 CLINICAL DATA:  Metastatic renal cell carcinoma diagnosed in August 2022. Post thoracentesis and immunotherapy. EXAM: CT CHEST, ABDOMEN AND PELVIS WITHOUT CONTRAST TECHNIQUE: Multidetector CT imaging of the chest, abdomen and pelvis was performed following the standard protocol without IV contrast. RADIATION DOSE REDUCTION: This exam was performed according to the departmental dose-optimization program which includes automated exposure control, adjustment of the mA and/or kV according to patient size and/or use of iterative reconstruction technique. COMPARISON:  Chest CT 08/31/2021 and CTs of the chest, abdomen and pelvis 08/10/2021. FINDINGS: CT CHEST FINDINGS Cardiovascular: Diffuse atherosclerosis of the aorta, great vessels and coronary arteries again noted. There is stable dilatation of the ascending aorta which has a  maximal diameter 4.4 cm. The heart size is normal. There is no pericardial effusion. Mediastinum/Nodes: There are stable small calcified mediastinal and hilar lymph nodes. No progressive adenopathy identified. There is contrast material within the thoracic esophagus which appears patent. A small hiatal hernia is present. The thyroid gland demonstrates stable nodularity, unlikely to be clinically significant given additional morbidities. No followup recommended.(Ref: J Am Coll Radiol. 2015 Feb;12(2): 143-50). Lungs/Pleura: Extensive circumferential pleural tumor is again noted within the left hemithorax. Overall, this appears slightly improved compared with the 08/10/2021 study. For example, a rind of tumor superomedially measures 2.6 cm in maximal thickness on image 12/2 (previously 3.5 cm). Posteromedially, tumor measures up to 3.3 cm in thickness on image 44/2 (previously 4.2 cm). No significant progression identified. There is no pleural effusion or contralateral right pleural disease. No suspicious pulmonary nodules are identified.  There is stable partially calcified nodular scarring at the right lung apex. Underlying mild centrilobular emphysema noted. Musculoskeletal/Chest wall: No chest wall mass or suspicious osseous findings. CT ABDOMEN AND PELVIS FINDINGS Hepatobiliary: No focal hepatic abnormalities are identified on noncontrast imaging. Multiple peripherally calcified gallstones are present the gallbladder is mildly distended, but demonstrates no wall thickening or surrounding inflammation. No evidence of biliary dilatation. Pancreas: Unremarkable. No pancreatic ductal dilatation or surrounding inflammatory changes. Spleen: Normal in size without focal abnormality. Adrenals/Urinary Tract: Both adrenal glands appear stable without suspicious findings. The right kidney appears unremarkable. Complex partially calcified masses are again noted within the lower pole of the left kidney. The more superior component measures 3.5 x 2.9 cm on image 67/2 (compared with 3.4 x 3.3 cm on previous study, remeasured). A more inferior component measures 3.9 x 2.8 cm on image 71/2 (previously 4.1 x 3.5 cm, remeasured). There are additional simple cysts in the upper pole and interpolar region of the left kidney which are grossly stable. No evidence of urinary tract calculus or hydronephrosis. The bladder appears unremarkable for its degree of distention. Stomach/Bowel: Enteric contrast was administered and has passed into the distal small bowel. The stomach appears unremarkable for its degree of distension. No evidence of bowel wall thickening, distention or surrounding inflammatory change. Vascular/Lymphatic: There are no enlarged abdominal or pelvic lymph nodes. Diffuse aortic and branch vessel atherosclerosis. Reproductive: The uterus and ovaries appear unremarkable on noncontrast imaging. Other: No ascites or peritoneal nodularity.  Intact abdominal wall. Musculoskeletal: There is a new mild superior endplate compression fracture at L1 resulting in  approximately 20% loss of vertebral body height. This demonstrates no pathologic features. A chronic superior endplate compression deformity at L4 is unchanged. There are no focal lesions suspicious for osseous metastatic disease. The bones are diffusely demineralized. IMPRESSION: 1. Previously demonstrated extensive left pleural metastatic disease has mildly improved in the interval. 2. The complex solid left renal masses have minimally changed. 3. No evidence of progressive metastatic disease. 4. Interval mild superior endplate compression fracture at L1 without pathologic features. 5. Stable additional incidental findings including cholelithiasis, left renal cysts, coronary and Aortic Atherosclerosis (ICD10-I70.0). Electronically Signed   By: Richardean Sale M.D.   On: 10/04/2021 12:05     ASSESSMENT/PLAN:  This is a very pleasant 87 year old Caucasian female originally from [place].  She has been diagnosed with metastatic renal cell carcinoma.  She presented with a large left renal mass in addition to left hemithorax pleural-based metastasis as well as left hilar mediastinal lymphadenopathy.  She was diagnosed in 2022.  Her molecular studies show no actionable mutations and her PD-L1 expression is  negative.  The patient is currently undergoing treatment with immunotherapy with ipilimumab 1 mg/kg as well as nivolumab 3 mg/kg IV every 3 weeks status post 4 cycles treatment was discontinued due to disease progression.  The patient recently had disease progression with left-sided pleural tumor with similar size of the left renal mass.  Dr. Julien Nordmann recommended adding Cabometyx 40 mg p.o. daily with her nivolumab IV every 4 weeks.  She started Cabometyx on 08/21/21.  She is status post 2 cycles.  The patient recently had a restaging CT scan performed.  Dr. Julien Nordmann personally and independently reviewed the scan and discussed the results with the patient today.  The scan showed stable disease.  There is no  evidence of disease progression.  Dr. Julien Nordmann recommend that she continue on the same treatment at the same dose.  She will proceed with cycle #3 today scheduled.  While her left-sided rib pain from the left pleural metastatic disease has improved, the patient has developed diffuse arthralgias which may be secondary to her immunotherapy.  We discussed at length that as long as her pain is tolerable by supportive measures that we would continue on the same treatment since it is stabilized her disease.  Her pain is not controlled with oxycodone at this time.  Dr. Julien Nordmann recommends referral to palliative care to optimize her pain medication regimen.  The patient and her daughter state that she has enough oxycodone at this time but know to call the clinic if a refill is needed in the interval.  She will also continue with IcyHot patches if needed.  I discussed with the patient and her daughter that she does have a mild compression fracture in her low back which may be from falling a few weeks ago.   The patient has poor nutrition and continued weight loss.  The patient is currently on Remeron for insomnia and decreased appetite which was started at her last appointment.  She will continue taking this for now.  She is followed by member the nutritionist team.  The patient is reportedly a picky eater.  Discussed with the patient and her daughter if they do not like the protein supplements that are sold commercially, that they may make their own protein shakes at home.   We will see her back for follow-up visit in 4 weeks for evaluation before starting cycle #4.  The patient was advised to call immediately if she has any concerning symptoms in the interval. The patient voices understanding of current disease status and treatment options and is in agreement with the current care plan. All questions were answered. The patient knows to call the clinic with any problems, questions or concerns. We can certainly see  the patient much sooner if necessary         Orders Placed This Encounter  Procedures   Amb Referral to Palliative Care    Referral Priority:   Routine    Referral Type:   Consultation    Referral Reason:   Symptom Managment    Number of Visits Requested:   [place], PA-C 10/16/21  ADDENDUM: Hematology/Oncology Attending: I had a face-to-face encounter with the patient today.  I reviewed her record, lab, scan and recommended her care plan.  This is a very pleasant 87 years old white female with metastatic renal cell carcinoma initially treated with 4 cycles of immunotherapy with Ipilumumab and nivolumab but this was discontinued secondary to disease progression. The patient started modified treatment  with Cabometyx as well as nivolumab every 4 weeks status post 2 cycles.  She has been tolerating this treatment well with no concerning complaints except for fatigue and generalized arthralgia.  Her left-sided chest pain has significantly improved. The patient had repeat CT scan of the chest, abdomen pelvis performed recently.  I personally and independently reviewed the scan images and discussed the result with the patient and her family. Her scan showed mild improvement of her disease in the chest and no concerning findings for disease progression. I recommended for her to continue her current treatment with Cabometyx and nivolumab as planned and she will proceed with cycle #3 today. For the pain management, she is currently on Percocet but this is not controlling her generalized arthralgia and aching pain.  We will refer her to the palliative care clinic for evaluation and management of her condition. The patient will come back for follow-up visit in 4 weeks for evaluation before the next cycle of her treatment. She was advised to call immediately if she has any other concerning symptoms in the interval.  Disclaimer: This note was dictated with voice recognition  software. Similar sounding words can inadvertently be transcribed and may be missed upon review. Eilleen Kempf, MD

## 2021-10-05 ENCOUNTER — Other Ambulatory Visit (HOSPITAL_COMMUNITY): Payer: Self-pay

## 2021-10-06 ENCOUNTER — Other Ambulatory Visit: Payer: Self-pay | Admitting: Internal Medicine

## 2021-10-08 ENCOUNTER — Encounter: Payer: Self-pay | Admitting: Internal Medicine

## 2021-10-08 ENCOUNTER — Other Ambulatory Visit (HOSPITAL_COMMUNITY): Payer: Self-pay

## 2021-10-09 ENCOUNTER — Other Ambulatory Visit: Payer: Self-pay | Admitting: Internal Medicine

## 2021-10-09 ENCOUNTER — Telehealth: Payer: Self-pay | Admitting: Medical Oncology

## 2021-10-09 MED ORDER — OXYCODONE-ACETAMINOPHEN 5-325 MG PO TABS: 1.0000 | ORAL_TABLET | Freq: Three times a day (TID) | ORAL | 0 refills | Status: DC | PRN

## 2021-10-09 NOTE — Telephone Encounter (Signed)
Pain med request was denied .  She is not sure why.  Gail Fitzgerald is having a lot of pain.

## 2021-10-16 ENCOUNTER — Inpatient Hospital Stay: Payer: Medicaid Other | Attending: Internal Medicine

## 2021-10-16 ENCOUNTER — Telehealth: Payer: Self-pay | Admitting: Nurse Practitioner

## 2021-10-16 ENCOUNTER — Inpatient Hospital Stay: Payer: Medicaid Other

## 2021-10-16 ENCOUNTER — Encounter: Payer: Self-pay | Admitting: Physician Assistant

## 2021-10-16 ENCOUNTER — Inpatient Hospital Stay: Payer: Medicaid Other | Admitting: Dietician

## 2021-10-16 ENCOUNTER — Inpatient Hospital Stay: Payer: Medicaid Other | Admitting: Physician Assistant

## 2021-10-16 ENCOUNTER — Other Ambulatory Visit: Payer: Self-pay

## 2021-10-16 VITALS — BP 140/67 | HR 59 | Temp 97.9°F | Resp 17 | Ht 61.0 in | Wt 90.0 lb

## 2021-10-16 DIAGNOSIS — C642 Malignant neoplasm of left kidney, except renal pelvis: Secondary | ICD-10-CM | POA: Insufficient documentation

## 2021-10-16 DIAGNOSIS — E639 Nutritional deficiency, unspecified: Secondary | ICD-10-CM | POA: Diagnosis not present

## 2021-10-16 DIAGNOSIS — Z5112 Encounter for antineoplastic immunotherapy: Secondary | ICD-10-CM | POA: Insufficient documentation

## 2021-10-16 DIAGNOSIS — G893 Neoplasm related pain (acute) (chronic): Secondary | ICD-10-CM | POA: Diagnosis not present

## 2021-10-16 DIAGNOSIS — Z79899 Other long term (current) drug therapy: Secondary | ICD-10-CM | POA: Insufficient documentation

## 2021-10-16 LAB — CBC WITH DIFFERENTIAL (CANCER CENTER ONLY)
Abs Immature Granulocytes: 0.01 10*3/uL (ref 0.00–0.07)
Basophils Absolute: 0.1 10*3/uL (ref 0.0–0.1)
Basophils Relative: 1 %
Eosinophils Absolute: 0.1 10*3/uL (ref 0.0–0.5)
Eosinophils Relative: 1 %
HCT: 35.6 % — ABNORMAL LOW (ref 36.0–46.0)
Hemoglobin: 12.2 g/dL (ref 12.0–15.0)
Immature Granulocytes: 0 %
Lymphocytes Relative: 25 %
Lymphs Abs: 1.9 10*3/uL (ref 0.7–4.0)
MCH: 29.3 pg (ref 26.0–34.0)
MCHC: 34.3 g/dL (ref 30.0–36.0)
MCV: 85.4 fL (ref 80.0–100.0)
Monocytes Absolute: 0.9 10*3/uL (ref 0.1–1.0)
Monocytes Relative: 12 %
Neutro Abs: 4.6 10*3/uL (ref 1.7–7.7)
Neutrophils Relative %: 61 %
Platelet Count: 185 10*3/uL (ref 150–400)
RBC: 4.17 MIL/uL (ref 3.87–5.11)
RDW: 20.4 % — ABNORMAL HIGH (ref 11.5–15.5)
WBC Count: 7.6 10*3/uL (ref 4.0–10.5)
nRBC: 0 % (ref 0.0–0.2)

## 2021-10-16 LAB — CMP (CANCER CENTER ONLY)
ALT: 12 U/L (ref 0–44)
AST: 28 U/L (ref 15–41)
Albumin: 3.7 g/dL (ref 3.5–5.0)
Alkaline Phosphatase: 117 U/L (ref 38–126)
Anion gap: 10 (ref 5–15)
BUN: 13 mg/dL (ref 8–23)
CO2: 23 mmol/L (ref 22–32)
Calcium: 8.9 mg/dL (ref 8.9–10.3)
Chloride: 96 mmol/L — ABNORMAL LOW (ref 98–111)
Creatinine: 0.56 mg/dL (ref 0.44–1.00)
GFR, Estimated: >60 mL/min (ref >60)
Glucose, Bld: 107 mg/dL — ABNORMAL HIGH (ref 70–99)
Potassium: 4.3 mmol/L (ref 3.5–5.1)
Sodium: 129 mmol/L — ABNORMAL LOW (ref 135–145)
Total Bilirubin: 0.5 mg/dL (ref 0.3–1.2)
Total Protein: 6.5 g/dL (ref 6.5–8.1)

## 2021-10-16 LAB — TSH: TSH: 1.027 u[IU]/mL (ref 0.308–3.960)

## 2021-10-16 MED ORDER — SODIUM CHLORIDE 0.9 % IV SOLN: Freq: Once | INTRAVENOUS | Status: AC

## 2021-10-16 MED ORDER — FAMOTIDINE IN NACL 20-0.9 MG/50ML-% IV SOLN: 20.0000 mg | Freq: Once | INTRAVENOUS | Status: DC

## 2021-10-16 MED ORDER — SODIUM CHLORIDE 0.9 % IV SOLN
480.0000 mg | Freq: Once | INTRAVENOUS | Status: AC
  Administered 2021-10-16: 480 mg via INTRAVENOUS
  Filled 2021-10-16: qty 48

## 2021-10-16 MED ORDER — DIPHENHYDRAMINE HCL 50 MG/ML IJ SOLN: 25.0000 mg | Freq: Once | INTRAMUSCULAR | Status: DC

## 2021-10-16 NOTE — Patient Instructions (Signed)
[facility]  Discharge Instructions: Thank you for choosing [facility] to provide your oncology and hematology care.   If you have a lab appointment with the [facility], please go directly to the [facility] and check in at the registration area.   Wear comfortable clothing and clothing appropriate for easy access to any Portacath or PICC line.   We strive to give you quality time with your provider. You may need to reschedule your appointment if you arrive late (15 or more minutes).  Arriving late affects you and other patients whose appointments are after yours.  Also, if you miss three or more appointments without notifying the office, you may be dismissed from the clinic at the providers discretion.      For prescription refill requests, have your pharmacy contact our office and allow 72 hours for refills to be completed.    Today you received the following chemotherapy and/or immunotherapy agents: Opdivo.      To help prevent nausea and vomiting after your treatment, we encourage you to take your nausea medication as directed.  BELOW ARE SYMPTOMS THAT SHOULD BE REPORTED IMMEDIATELY: *FEVER GREATER THAN 100.4 F (38 C) OR HIGHER *CHILLS OR SWEATING *NAUSEA AND VOMITING THAT IS NOT CONTROLLED WITH YOUR NAUSEA MEDICATION *UNUSUAL SHORTNESS OF BREATH *UNUSUAL BRUISING OR BLEEDING *URINARY PROBLEMS (pain or burning when urinating, or frequent urination) *BOWEL PROBLEMS (unusual diarrhea, constipation, pain near the anus) TENDERNESS IN MOUTH AND THROAT WITH OR WITHOUT PRESENCE OF ULCERS (sore throat, sores in mouth, or a toothache) UNUSUAL RASH, SWELLING OR PAIN  UNUSUAL VAGINAL DISCHARGE OR ITCHING   Items with * indicate a potential emergency and should be followed up as soon as possible or go to the Emergency Department if any problems should occur.  Please show the CHEMOTHERAPY ALERT CARD or IMMUNOTHERAPY ALERT CARD at check-in to  the Emergency Department and triage nurse.  Should you have questions after your visit or need to cancel or reschedule your appointment, please contact [facility]  Dept: [phone]  and follow the prompts.  Office hours are 8:00 a.m. to 4:30 p.m. Monday - Friday. Please note that voicemails left after 4:00 p.m. may not be returned until the following business day.  We are closed weekends and major holidays. You have access to a nurse at all times for urgent questions. Please call the main number to the clinic Dept: [phone] and follow the prompts.   For any non-urgent questions, you may also contact your provider using MyChart. We now offer e-Visits for anyone 13 and older to request care online for non-urgent symptoms. For details visit [email or web address].   Also download the MyChart app! Go to the app store, search "MyChart", open the app, select [facility], and log in with your MyChart username and password.  Due to Covid, a mask is required upon entering the hospital/clinic. If you do not have a mask, one will be given to you upon arrival. For doctor visits, patients may have 1 support person aged 29 or older with them. For treatment visits, patients cannot have anyone with them due to current Covid guidelines and our immunocompromised population.   Nivolumab injection ??? ???????????? ????? ??? ?????????? ????????? -- ??? ???????? ?????????????? ???????. ?? ??????????? ??? ??????? ????????? ??????????????? ????????. ? ?? ????? ?????? ??? ??????? ?????, ??? ?????? ? ???, ??????????? ???????, ??? ??????? ? ????????. ??? ????????? ????? ??????????? ? ?????? ?????; ???? ? ??? ????????? ???????, ?????????? ? ???????????? ????????? ??? ??????????. ???????????????? ????????? ???????? (????????):  Opdivo ??? ??? ??????? ?????????? ???????????? ????????? ?? ?????? ?????? ????? ?????????? ?????????? ?????, ???? ?? ? ??? ???? ?? ????????? ?????????: ????????????  ???????????, ????? ??? ??????? ?????, ???????? ????? ??? ????????; ???????????? ??? ??????????????? ?????????? ?????????????? ????????? ?????? (????????? ????????? ?????? ??????? ????????); ????????? ??????? ??????? ??????? ? ???????; ?????????????? ???????; ???????????? ??????? ???????, ????? ??? ????????? ?????? ??? ??????? ??????-?????; ????????? ??? ????????????? ??????? ?? ?????????, ?????? ?????????, ??????? ????????, ????????? ??? ???????????; ???????????? ??? ???????????? ????????????; ????????? ??????. ??? ??? ??????? ????????? ??? ?????????? ??? ????????? ????????????? ??? ????????????? ????????. ??? ????????? ???????? ? ???????? ??? ???????????. ????? ??????? ??????? ??? ????? ????????????? ??????????? ?????????? ? ????????? (MedGuide). ?????? ??? ??????? ??????????? ???????? ??? ??????????. ???????? ??????????? ?????????? ? ??????????? ?????????? ????? ????????? ?????. ???????? ?? ?? ??? ?? ???????????? ?????????? ??? ????? ????????? ????? ? 12 ???, ??????? ????????? ???? ????????????????. ?????????????: ???? ??? ???????, ??? ?? ????????? ?????????? ???? ????? ?????????, ?????????? ?????????? ? ????????????????? ????? ??? ???????? ????????? ??? ???????? ?????????? ??????. ??????????: ??? ????????? ????????????? ?????? ??? ???. ?? ???????? ?? ? ??????? ??????. ??? ?????????? ? ?????? ???????? ?????? ?????????? ?? ????????? ????????? ?? ????? ??? ???????? ??????????? ???. ????? ????????? ????????? ?????????. ???? ?? ?? ?????? ?????? ?? ????? ? ??????????? ?????, ???????? ?? ???? ??????????? ??????????. ? ??? ??? ????????? ????? ???????? ?? ??????????????? ?????????????? ?? ?????????. ???? ???????? ?? ???????? ???? ????????? ??????????????. ???????????? ???????????? ????????? ?????? ???? ??????????? ???? ????????, ????????????? ????????, ?????????????? ?????????? ? ??????? ???????. ????? ???????? ??? ? ???????, ???????????? ??????????? ???????? ??? ??????????. ????????? ???????? ?????  ???????? ?? ?????????????? ? ??????????? ???? ??????????. ?? ??? ????? ???????? ???????? ??? ????????????? ????? ?????????? ?? ????? ?????? ????? ????????? ?? ?????? ??? ?????????? ??????????? ?????. ?? ????? ?????? ????? ????????? ??? ??????? ????????? ??????? ??????? ?????. ????????? ???????????? ?? ????? ?????? ????? ????????? ? ? ??????? 5 ??????? ????? ??? ???????????. ???????? ??????? ???????? ??????????? ?????????? ? ???????????? ???????????? ??? ????????????? ? ????????? ????????????. ?????????? ??????????? ??????????? ?????????????? ??????????? ????? ????????? ?? ????. ?????????? ? ??????????? ?????????? ?? ????? ????????? ???????????. ?? ????? ?????????? ????? ????????? ? ? ??????? 5 ??????? ????? ??? ??????????? ?????? ??????? ??????? ??????. ????? ???????? ??????? ????? ???? ??? ?????? ????? ?????????? ???????? ???????, ? ??????? ??????? ??? ????? ?????? ???????? ??????????? ??????????: ????????????? ??????? -- ?????? ????, ???, ??????????, ???? ????, ???, ????? ??? ?????; ??? ? ???????? ????? ??? ?????? ????????????? ???; ????????? ??????; ???? ? ?????; ??????; ????? ??????, ?????? ??? ???????????? ???????; ???? ? ?????. ????????? ??? ??????????? ????????????; ????????? ???????????, ?????; ??????? ??????? ?????? ? ????? (?????????????) -- ???????? ????? ??? ?????????? ?????? ?????????? ????, ????????? ???????? ??? ????????????, ?????????? ??????; ??????? ??????? ???????? ?????????? ?????? (???????????) -- ????????? ??? ??????????? ????????????, ???????? ????? ????, ?????????? ????????????? ??? ?????????? ???????????????? ? ?????, ?????? ??? ???????????, ???????, ???????????, ???????????? ????????????? ????? ??? ??????????? ?????????; ????????? ????? -- ?????????? ?????????? ?????????? ????, ????????? ???????, ?????? ??? ????; ??????????? ?????? -- ???? ? ?????? ??????????, ?????? ????????, ???????, ?????????????? ????, ???? ?????-??????? ??? ??????????? ?????, ?????????? ???? ???  ?????? ????, ????????? ???????? ??? ????????????; ?????? ??????? ??????????? -- ????????? ???????? ??? ????????????, ??????????????, ???????? ????, ???????????? ???????; ?????? ??????? ???????? ?????????? ?????? (??????????) -- ????????? ???????? ??? ????????????, ?????????? ???????????????? ? ??????, ?????, ????????? ?????, ??????? ????, ?????????? ????? ????, ??????????? ?????????; ????????? ?????????? ??? ????????? -- ??????????? ????????, ????????? ???????? ???????? ??? ??????? ???????, ?????????????????; ???? ? ?????? ??? ????????; ????, ??????????? ??? ???????? ? ?????? ??? ??????, ???????? ????????, ????????? ???????, ????????? ?????????? ??? ???????????. ???? ???????? ??? ?????-??????????? ?????; ??????????? ????, ??????????? ???????, ????????? ??? ?????? ???????????? ? ????????? ????, ? ??? ????? ????????? ?? ???. ???? ? ???????; ????????? ?????? ??? ????????????. ???????? ???????, ??????? ?????? ?? ??????? ???????????? ???????? (???????? ??????????? ??????????, ???? ??? ??????????? ??? ?????????? ??????????): ???? ? ??????; ?????; ?????? ????????; ???????; ?????????. ?????. ???? ???????? ?? ???????? ???? ????????? ???????? ????????. ?????????? ? ????? ?? ??????? ???????????? ???????? ????????. ?? ?????? ???????? ? ???????? ???????? ?  FDA ?? ???????? [phone]. ??? ??????? ??????? ??? ?????????? ??? ????????? ???????? ?????? ??? ?????? ??????????? ?????????? ? ???????? ??? ???????. ??? ????????? ?? ???????? ??? ???????? ? ???????? ????????. ??????????: ???? ???????? ?? ???????? ???? ????????? ????????. ???? ? ??? ????????? ???????, ?????????? ??????? ?????????, ?????????? ? ?????, ?????????? ??? ??????? ???????????? ?????????.  2022 [employer] Standard (2021-03-30 00:00:00)

## 2021-10-16 NOTE — Progress Notes (Signed)
Nutrition Follow-up: ° °Patient with metastatic renal cell carcinoma. She is currently receiving opdivo q4 weeks with daily carbometyx.  ° °Met with patient during infusion. Interpretor present for visit. Patient reports her appetite has improved. Patient recalls eating a small amounts of variety of foods throughout the day (oatmeal, bread/butter, soups, salads, fish, crackers/cheese) She is taking appetite stimulant (remeron) nightly. Patient is not drinking Ensure supplement regularly. These are too sweet. Patient reports no longer enjoying sweet foods since starting treatment. She denies nausea, vomiting, diarrhea, constipation.  ° ° °Medications: oxycodone, remeron, protonix, compazine ° °Labs: Na 129, glucose ° °Anthropometrics: Weight 90 lb today decreased  ° °1/10 - 94 lb 12.8 oz °12/20- 96 lb 14.4 oz °12/6 - 99 lb 9.6 oz °11/15 - 106 lb 3.2 oz °10/25 - 109 lb 14.4 oz ° °Weights have decreased 7% in the last 7 weeks; significant  ° ° °NUTRITION DIAGNOSIS: Unintentional weight loss ongoing ° ° °INTERVENTION:  °Continue to encourage high calorie, high protein foods °Encouraged small frequent meals and snacks q2 hours while awake °Provided samples of [employer] supplements for pt to try °Continue taking appetite stimulant as prescribed  ° °  ° °MONITORING, EVALUATION, GOAL: weight trends, intake ° ° °NEXT VISIT: Tuesday March 7 during infusion  ° ° ° °

## 2021-10-17 ENCOUNTER — Encounter: Payer: Self-pay | Admitting: Internal Medicine

## 2021-10-17 NOTE — Telephone Encounter (Signed)
Appt confirmed per family member

## 2021-10-22 ENCOUNTER — Encounter: Payer: Self-pay | Admitting: Internal Medicine

## 2021-10-22 ENCOUNTER — Other Ambulatory Visit: Payer: Self-pay

## 2021-10-22 ENCOUNTER — Other Ambulatory Visit (HOSPITAL_COMMUNITY): Payer: Self-pay

## 2021-10-22 ENCOUNTER — Inpatient Hospital Stay (HOSPITAL_BASED_OUTPATIENT_CLINIC_OR_DEPARTMENT_OTHER): Payer: Medicaid Other | Admitting: Nurse Practitioner

## 2021-10-22 ENCOUNTER — Encounter: Payer: Self-pay | Admitting: Nurse Practitioner

## 2021-10-22 VITALS — BP 140/65 | HR 63 | Temp 97.7°F | Resp 17 | Wt 90.1 lb

## 2021-10-22 DIAGNOSIS — R634 Abnormal weight loss: Secondary | ICD-10-CM | POA: Diagnosis not present

## 2021-10-22 DIAGNOSIS — C642 Malignant neoplasm of left kidney, except renal pelvis: Secondary | ICD-10-CM

## 2021-10-22 DIAGNOSIS — Z515 Encounter for palliative care: Secondary | ICD-10-CM

## 2021-10-22 DIAGNOSIS — G893 Neoplasm related pain (acute) (chronic): Secondary | ICD-10-CM

## 2021-10-22 DIAGNOSIS — Z5112 Encounter for antineoplastic immunotherapy: Secondary | ICD-10-CM | POA: Diagnosis not present

## 2021-10-22 DIAGNOSIS — R630 Anorexia: Secondary | ICD-10-CM

## 2021-10-22 DIAGNOSIS — K5900 Constipation, unspecified: Secondary | ICD-10-CM

## 2021-10-22 MED ORDER — CELECOXIB 100 MG PO CAPS
100.0000 mg | ORAL_CAPSULE | Freq: Two times a day (BID) | ORAL | 0 refills | Status: DC
  Filled 2021-10-22: qty 60, 30d supply, fill #0

## 2021-10-22 NOTE — Progress Notes (Signed)
[place]  [phone] [phone]   Name: Gail Fitzgerald Date: 10/22/2021 MRN: [id number]  DOB: 1935/04/04  Patient Care Team: [name], MD as PCP - General (Family Medicine)    REASON FOR CONSULTATION: Gail Fitzgerald is a 87 y.o. 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  Now actively receiving Opdivo and Cabometyx.  Palliative ask to see for symptom management and goals of care.    SOCIAL HISTORY:     reports that she has never smoked. She has never used smokeless tobacco. She reports that she does not currently use alcohol. She reports that she does not currently use drugs.  ADVANCE DIRECTIVES:  Patient does not have advanced directives.  Does not wish to discuss.  CODE STATUS: Full code  PAST MEDICAL HISTORY: Past Medical History:  Diagnosis Date   Hypertension     PAST SURGICAL HISTORY:  Past Surgical History:  Procedure Laterality Date   IR THORACENTESIS ASP PLEURAL SPACE W/IMG GUIDE  04/05/2021   THORACENTESIS N/A 04/12/2021   Procedure: Mathews Robinsons;  Surgeon: [name], DO;  Location: [facility];  Service: Pulmonary;  Laterality: N/A;    HEMATOLOGY/ONCOLOGY HISTORY:  Oncology History  Renal cell carcinoma, left ([place])  05/09/2021 Initial Diagnosis   Renal cell carcinoma, left ([place])   05/22/2021 -  Chemotherapy   Patient is on Treatment Plan : Renal cell nivolumab + Ipilimumab q21d        ALLERGIES:  has no active allergies.  MEDICATIONS:  Current Outpatient Medications  Medication Sig Dispense Refill   celecoxib (CELEBREX) 100 MG capsule Take 1 capsule (100 mg total) by mouth 2 (two) times daily. 60 capsule 0   Aflibercept (EYLEA IO) Inject 1 Dose into the eye every 30 (thirty)  days. IO left eye monthly     amLODipine (NORVASC) 10 MG tablet Take 10 mg by mouth daily.     aspirin EC 81 MG tablet Take 81 mg by mouth every evening. Swallow whole.     benazepril (LOTENSIN) 20 MG tablet Take 20 mg by mouth daily.     cabozantinib (CABOMETYX) 40 MG tablet Take 1 tablet (40 mg total) by mouth daily. Take on an empty stomach, 1 hour before or 2 hours after meals. 30 tablet 2   ibuprofen (ADVIL) 200 MG tablet Take 400 mg by mouth every 8 (eight) hours as needed (pain.).     LORazepam (ATIVAN) 0.5 MG tablet 1 tablet p.o. 30 minutes before the MRI.  May repeat once if needed. 2 tablet 0   Magnesium 400 MG CAPS Take 400 mg by mouth See admin instructions. Takes 1 tablet (400 mg) by mouth once daily 10 days on and 10 days off.     metoprolol succinate (TOPROL-XL) 100 MG 24 hr tablet Take 50 mg by mouth in the morning and at bedtime.     mirtazapine (REMERON) 7.5 MG tablet TAKE 1 TABLET BY MOUTH AT BEDTIME. 90 tablet 1   OVER THE COUNTER MEDICATION Take 1 tablet by mouth daily as needed (for stomach discomfort). Allochol - (Herbal Supplement) Multivitamin with : Magnesium, potassium, garlic and charcoal. (Russian Medication)     oxyCODONE-acetaminophen (PERCOCET/ROXICET) 5-325 MG tablet Take 1 tablet by mouth every 8 (eight) hours as needed for severe pain. 30 tablet 0   pantoprazole (PROTONIX) 40 MG tablet TAKE  1 TABLET BY MOUTH EVERY DAY 30 tablet 1   prochlorperazine (COMPAZINE) 10 MG tablet Take 1 tablet (10 mg total) by mouth every 6 (six) hours as needed for nausea or vomiting. 30 tablet 2   simvastatin (ZOCOR) 20 MG tablet Take 20 mg by mouth every evening.     UNABLE TO FIND Take 1 tablet by mouth daily as needed (chest pain (angina)). Med Name: Validol (Menthyl isovalerate and menthol) (Russian Medication)     No current facility-administered medications for this visit.    VITAL SIGNS: BP 140/65 (BP Location: Left Arm, Patient Position: Sitting)    Pulse 63    Temp 97.7 F  (36.5 C) (Tympanic)    Resp 17    Wt 90 lb 2 oz (40.9 kg)    SpO2 100%    BMI 17.03 kg/m  Filed Weights   10/22/21 1007  Weight: 90 lb 2 oz (40.9 kg)    Estimated body mass index is 17.03 kg/m as calculated from the following:   Height as of 10/16/21: 5\' 1"  (1.549 m).   Weight as of this encounter: 90 lb 2 oz (40.9 kg).  LABS: CBC:    Component Value Date/Time   WBC 7.6 10/16/2021 1029   WBC 10.1 08/31/2021 1036   HGB 12.2 10/16/2021 1029   HCT 35.6 (L) 10/16/2021 1029   PLT 185 10/16/2021 1029   MCV 85.4 10/16/2021 1029   NEUTROABS 4.6 10/16/2021 1029   LYMPHSABS 1.9 10/16/2021 1029   MONOABS 0.9 10/16/2021 1029   EOSABS 0.1 10/16/2021 1029   BASOSABS 0.1 10/16/2021 1029   Comprehensive Metabolic Panel:    Component Value Date/Time   NA 129 (L) 10/16/2021 1029   K 4.3 10/16/2021 1029   CL 96 (L) 10/16/2021 1029   CO2 23 10/16/2021 1029   BUN 13 10/16/2021 1029   CREATININE 0.56 10/16/2021 1029   GLUCOSE 107 (H) 10/16/2021 1029   CALCIUM 8.9 10/16/2021 1029   AST 28 10/16/2021 1029   ALT 12 10/16/2021 1029   ALKPHOS 117 10/16/2021 1029   BILITOT 0.5 10/16/2021 1029   PROT 6.5 10/16/2021 1029   ALBUMIN 3.7 10/16/2021 1029    RADIOGRAPHIC STUDIES: CT HEAD WO CONTRAST (5MM)  Result Date: 10/02/2021 CLINICAL DATA:  Head injury 1 week ago. History of renal cell carcinoma EXAM: CT HEAD WITHOUT CONTRAST TECHNIQUE: Contiguous axial images were obtained from the base of the skull through the vertex without intravenous contrast. RADIATION DOSE REDUCTION: This exam was performed according to the departmental dose-optimization program which includes automated exposure control, adjustment of the mA and/or kV according to patient size and/or use of iterative reconstruction technique. COMPARISON:  CT head 08/31/2021 FINDINGS: Brain: Mild atrophy. Left frontal periventricular white matter hypodensity compatible chronic infarct is unchanged. Negative for acute infarct, hemorrhage,  mass. No edema in the brain. Vascular: Negative for hyperdense vessel Skull: Negative Sinuses/Orbits: Paranasal sinuses clear. Bilateral cataract extraction Other: None IMPRESSION: No acute abnormality.  Atrophy and mild chronic ischemic changes. Electronically Signed   By: Franchot Gallo M.D.   On: 10/02/2021 14:01   CT CHEST ABDOMEN PELVIS WO CONTRAST  Result Date: 10/04/2021 CLINICAL DATA:  Metastatic renal cell carcinoma diagnosed in August 2022. Post thoracentesis and immunotherapy. EXAM: CT CHEST, ABDOMEN AND PELVIS WITHOUT CONTRAST TECHNIQUE: Multidetector CT imaging of the chest, abdomen and pelvis was performed following the standard protocol without IV contrast. RADIATION DOSE REDUCTION: This exam was performed according to the departmental dose-optimization program which includes automated exposure control,  adjustment of the mA and/or kV according to patient size and/or use of iterative reconstruction technique. COMPARISON:  Chest CT 08/31/2021 and CTs of the chest, abdomen and pelvis 08/10/2021. FINDINGS: CT CHEST FINDINGS Cardiovascular: Diffuse atherosclerosis of the aorta, great vessels and coronary arteries again noted. There is stable dilatation of the ascending aorta which has a maximal diameter 4.4 cm. The heart size is normal. There is no pericardial effusion. Mediastinum/Nodes: There are stable small calcified mediastinal and hilar lymph nodes. No progressive adenopathy identified. There is contrast material within the thoracic esophagus which appears patent. A small hiatal hernia is present. The thyroid gland demonstrates stable nodularity, unlikely to be clinically significant given additional morbidities. No followup recommended.(Ref: J Am Coll Radiol. 2015 Feb;12(2): 143-50). Lungs/Pleura: Extensive circumferential pleural tumor is again noted within the left hemithorax. Overall, this appears slightly improved compared with the 08/10/2021 study. For example, a rind of tumor  superomedially measures 2.6 cm in maximal thickness on image 12/2 (previously 3.5 cm). Posteromedially, tumor measures up to 3.3 cm in thickness on image 44/2 (previously 4.2 cm). No significant progression identified. There is no pleural effusion or contralateral right pleural disease. No suspicious pulmonary nodules are identified. There is stable partially calcified nodular scarring at the right lung apex. Underlying mild centrilobular emphysema noted. Musculoskeletal/Chest wall: No chest wall mass or suspicious osseous findings. CT ABDOMEN AND PELVIS FINDINGS Hepatobiliary: No focal hepatic abnormalities are identified on noncontrast imaging. Multiple peripherally calcified gallstones are present the gallbladder is mildly distended, but demonstrates no wall thickening or surrounding inflammation. No evidence of biliary dilatation. Pancreas: Unremarkable. No pancreatic ductal dilatation or surrounding inflammatory changes. Spleen: Normal in size without focal abnormality. Adrenals/Urinary Tract: Both adrenal glands appear stable without suspicious findings. The right kidney appears unremarkable. Complex partially calcified masses are again noted within the lower pole of the left kidney. The more superior component measures 3.5 x 2.9 cm on image 67/2 (compared with 3.4 x 3.3 cm on previous study, remeasured). A more inferior component measures 3.9 x 2.8 cm on image 71/2 (previously 4.1 x 3.5 cm, remeasured). There are additional simple cysts in the upper pole and interpolar region of the left kidney which are grossly stable. No evidence of urinary tract calculus or hydronephrosis. The bladder appears unremarkable for its degree of distention. Stomach/Bowel: Enteric contrast was administered and has passed into the distal small bowel. The stomach appears unremarkable for its degree of distension. No evidence of bowel wall thickening, distention or surrounding inflammatory change. Vascular/Lymphatic: There are no  enlarged abdominal or pelvic lymph nodes. Diffuse aortic and branch vessel atherosclerosis. Reproductive: The uterus and ovaries appear unremarkable on noncontrast imaging. Other: No ascites or peritoneal nodularity.  Intact abdominal wall. Musculoskeletal: There is a new mild superior endplate compression fracture at L1 resulting in approximately 20% loss of vertebral body height. This demonstrates no pathologic features. A chronic superior endplate compression deformity at L4 is unchanged. There are no focal lesions suspicious for osseous metastatic disease. The bones are diffusely demineralized. IMPRESSION: 1. Previously demonstrated extensive left pleural metastatic disease has mildly improved in the interval. 2. The complex solid left renal masses have minimally changed. 3. No evidence of progressive metastatic disease. 4. Interval mild superior endplate compression fracture at L1 without pathologic features. 5. Stable additional incidental findings including cholelithiasis, left renal cysts, coronary and Aortic Atherosclerosis (ICD10-I70.0). Electronically Signed   By: Richardean Sale M.D.   On: 10/04/2021 12:05    PERFORMANCE STATUS (ECOG) : 2 - Symptomatic, <50%  confined to bed  Review of Systems  Constitutional:  Positive for appetite change.  Musculoskeletal:  Positive for arthralgias.  Neurological:  Positive for weakness.  Unless otherwise noted, a complete review of systems is negative.  Physical Exam General: NAD, thin, in a wheelchair Cardiovascular: regular rate and rhythm Pulmonary: clear ant fields Abdomen: soft, nontender, + bowel sounds Extremities: no edema, no joint deformities Skin: no rashes Neurological: AAOx4 with a [place] interpreter  IMPRESSION:  This is my initial visit with Ms. Danish.  She presents to the clinic today with her daughter-in-law [place].  Patient is sitting in a wheelchair.  No acute distress noted.  I introduced myself, Advertising copywriter, and Palliative's  role in collaboration with the oncology team. Concept of Palliative Care was introduced as specialized medical care for people and their families living with serious illness.  It focuses on providing relief from the symptoms and stress of a serious illness.  The goal is to improve quality of life for both the patient and the family. Values and goals of care important to patient and family were attempted to be elicited.   Ms. Spackman lives in the home with her son and daughter-in-law.  She has 2 sons 1 who lives in [place].  She has been in the [place] for about 10 years.  She is retired from Sales promotion account executive work.  At home she is ambulatory with a walker.  Able to perform most ADLs independently however with some rest break in between these difficulty.  Appetite has been poor however with some improvement over the past few weeks.  Denies shortness of breath, nausea, vomiting, and insomnia.  Reports she takes naps daily.  Neoplasm related pain [name] complains of generalize pain with her main areas of discomfort being bilateral hips, shoulders, abdomen, and lower back. She points to those areas expressing her pain is better when she takes Tylenol or oxycodone however she does not wish to take strong opioids. Culturally she would like to try other medications that could possibly allow her improved relief in addition to Tylenol. We discussed continuing Tylenol ES three times daily. Education provided on quality of life and the use of opioids when needed for pain control. She verbalized understanding and will consider as last resort if ever needed. Education provided on use of Celebrex to assist in pain management. Ms. Strahm and family verbalized understanding and would be interested in adding to her regimen. Education provided on the use of Celebrex and precautions.   Decreased appetite/weight loss  Her appetite continues to be a challenge but with some improvement with use of mirtazapine. Her  current weight is 90.2 lbs down from 97 lbs on 08/28/21 and 114 lbs on 06/12/21. Reports decreased desire to eat. Rayford Halsted shares family continues to push foods that are of interest to her in addition to high protein and high-calorie food choices. We discussed protein drinks and supplements however patient does not like ensure or boost. Family has continuously tried to offer however she refuses. Rayford Halsted shares that she likes caviar and [place] foods when she does have an appetite. Education provided on recommendations to increase dosing to gain a better response, however they would like to maintain on current dose and re-evaluate at next visit. She also complains of dry mouth more specifically first in the morning and late evening. Education provided on hard candies to increase salivation in addition to the use of Biotene.   We discussed Her current illness and what it means in the larger  context of Her on-going co-morbidities. Natural disease trajectory and expectations were discussed.  Patient does not wish to discuss or speak on advanced directives or code status.   I discussed the importance of continued conversation with family and their medical providers regarding overall plan of care and treatment options, ensuring decisions are within the context of the patients values and GOCs.  PLAN: Tylenol 1000mg  three times daily Percocet every 8 hours as needed for breakthrough pain. Patient is not currently taking and not interested in opioids however appreciative it is available. Given level of pain/discomfort would consider more long-acting regimen to achieve better comfort. Will continue to support and closely evaluate respecting patient's wishes for other pharmacological and non pharmacological interventions.   Biotene gum, mouthwash, toothpaste for dry mouth. Miralax daily for contipation Celebrex 200mg  daily for pain  I will plan to see patient back in 2-4 weeks in collaboration to other oncology  appointments.    Patient expressed understanding and was in agreement with this plan. She also understands that She can call the clinic at any time with any questions, concerns, or complaints.   Time Total: 50 min  Visit consisted of counseling and education dealing with the complex and emotionally intense issues of symptom management and palliative care in the setting of serious and potentially life-threatening illness.Greater than 50%  of this time was spent counseling and coordinating care related to the above assessment and plan.  Signed by: Alda Lea, AGPCNP-BC Palliative Medicine Team

## 2021-10-22 NOTE — Patient Instructions (Addendum)
During our visit, we discussed the following:  Constipation-take Miralax (over the counter) once a day with juice or water (can take every other day)  Dry Mouth- take Biotin (over the counter)   We are placing a referral for Physical Therapy   Will begin new prescription to help with joint pain- DO NOT TAKE IBURPROFEN along with this  Continue taking one tablet of Remeron at night for appetite-we can increase dose if needed

## 2021-10-30 ENCOUNTER — Other Ambulatory Visit: Payer: Self-pay | Admitting: Internal Medicine

## 2021-10-30 ENCOUNTER — Other Ambulatory Visit (HOSPITAL_COMMUNITY): Payer: Self-pay

## 2021-10-30 MED ORDER — CABOMETYX 40 MG PO TABS
40.0000 mg | ORAL_TABLET | Freq: Every day | ORAL | 2 refills | Status: DC
  Filled 2021-11-02: qty 30, 30d supply, fill #0
  Filled 2021-11-23: qty 30, 30d supply, fill #1
  Filled 2021-12-21: qty 30, 30d supply, fill #2

## 2021-10-31 ENCOUNTER — Other Ambulatory Visit: Payer: Self-pay

## 2021-10-31 ENCOUNTER — Ambulatory Visit: Payer: Medicaid Other | Attending: Nurse Practitioner | Admitting: Rehabilitation

## 2021-10-31 ENCOUNTER — Encounter: Payer: Self-pay | Admitting: Rehabilitation

## 2021-10-31 DIAGNOSIS — C642 Malignant neoplasm of left kidney, except renal pelvis: Secondary | ICD-10-CM | POA: Insufficient documentation

## 2021-10-31 DIAGNOSIS — R630 Anorexia: Secondary | ICD-10-CM | POA: Diagnosis not present

## 2021-10-31 DIAGNOSIS — G893 Neoplasm related pain (acute) (chronic): Secondary | ICD-10-CM | POA: Insufficient documentation

## 2021-10-31 DIAGNOSIS — Z515 Encounter for palliative care: Secondary | ICD-10-CM | POA: Insufficient documentation

## 2021-10-31 DIAGNOSIS — R634 Abnormal weight loss: Secondary | ICD-10-CM | POA: Diagnosis not present

## 2021-10-31 DIAGNOSIS — K5900 Constipation, unspecified: Secondary | ICD-10-CM | POA: Diagnosis not present

## 2021-10-31 DIAGNOSIS — M6281 Muscle weakness (generalized): Secondary | ICD-10-CM | POA: Insufficient documentation

## 2021-10-31 NOTE — Therapy (Signed)
OUTPATIENT PHYSICAL THERAPY ONCOLOGY EVALUATION  Patient Name: Gail Fitzgerald MRN: [id number] DOB:11-05-1934, 87 y.o., female Today's Date: 10/31/2021   PT End of Session - 10/31/21 0856     Visit Number 1    Number of Visits 17    Date for PT Re-Evaluation 12/26/21    Authorization Type medicaid healthy blue - update auth when approved    PT Start Time 0800    PT Stop Time 0850    PT Time Calculation (min) 50 min    Activity Tolerance Patient tolerated treatment well    Behavior During Therapy [facility] for tasks assessed/performed             Past Medical History:  Diagnosis Date   Hypertension    Past Surgical History:  Procedure Laterality Date   IR THORACENTESIS ASP PLEURAL SPACE W/IMG GUIDE  04/05/2021   THORACENTESIS N/A 04/12/2021   Procedure: Mathews Robinsons;  Surgeon: [name], DO;  Location: [facility];  Service: Pulmonary;  Laterality: N/A;   Patient Active Problem List   Diagnosis Date Noted   Renal cell carcinoma, left ([place]) 05/09/2021   Encounter for antineoplastic immunotherapy 05/09/2021   S/P thoracentesis    Pleural effusion on left 03/10/2021    PCP: [name], MD  REFERRING PROVIDER: Jimmy Footman, NP   REFERRING DIAG: Renal cell carcinoma, left ([place])   THERAPY DIAG:  Muscle weakness (generalized)  ONSET DATE: 12/08/21  SUBJECTIVE                                                                                                                                                                                           SUBJECTIVE STATEMENT: I have been feeling weak for around 9 months since I got sick.  Sometimes everything is fine and sometimes I am weak.   PERTINENT HISTORY: Metastatic renal cell carcinoma presented with large left renal mass in addition to significant left hemothorax pleural-based metastasis as well as left hilar and mediastinal lymphadenopathy diagnosed in August 2022.  Fall with compression fracture L1  around 2 months ago.   PAIN:  Are you having pain? Yes NPRS scale: 7/10 Pain location: bil hips, shoulders, back, all joints can hurt  Pain orientation: Right, Left, and Bilateral  PAIN TYPE: aching and dull Pain description: constant  Aggravating factors: hips hurt more when laying down and back increases with movement and walking Relieving factors: medication  PRECAUTIONS: Fall and Other: active cancer  WEIGHT BEARING RESTRICTIONS No  FALLS:  Has patient fallen in last 6 months? Yes, Number of falls: 1 - trip on boxes   LIVING ENVIRONMENT: Lives with: lives with their  family and lives with their son; daughter in law and granddaughter Lives in: House/apartment Stairs: Yes; No stairs to bedroom but is able to do with a rail  Has following equipment at home: Gilford Rile - 2 wheeled uses it all the time in the home  Reports no ADL difficulty except cooking    OCCUPATION: retired   LEISURE: current HEP:  SLR, [place], 1# hand weights bicep curls, LTR,   HAND DOMINANCE : right   PRIOR LEVEL OF FUNCTION: Independent with household mobility with device  PATIENT GOALS to get stronger.     OBJECTIVE  COGNITION:  Overall cognitive status: Within functional limits for tasks assessed   OBSERVATIONS / OTHER ASSESSMENTS: frail older women.  Walks slowly but no LOB. Holds onto arm of daughter in law  POSTURE: increased thoracic kyphosis   LYMPHEDEMA ASSESSMENTS:   CHEMOTHERAPY: currently immunotherapy 1x per month making pt feel weak after infusions and 1 daily pill    Test Score Norms  TUG 83ft 22.15 seconds High Fall risk >/= 14 sec Any fall risk: 12+ sec Risk of developing disability: 9+  Gait Speed (distance (m)/time) 0.13 m/s <0.102m/s household only 0.4-0.8 limited community 0.8-1.2 community 1.2 able to cross streets  Needs intervention for fall risk: <41m/s  5 time sit to stand (16) 34.6 sec - just felt muscles working >13.6 sec= increased disability >15sec predicts  recurrent falls   30 second sit to stand (17)    <8 lower functional ability See below: minimum to maintain independent mobility   Grip Strength  Rt:        20                              Lt:15  Normal around 40lbs  6 min walk  Not if: resting HR>120, >180/191mmHg   Cardiac risk factors: peak HR<120-130BPM or rise of <20BPM  Beta blocker: HR not reliable use RPE and BP  Baseline HR:           BP:           O2:  Distance:  Post HR:           BP:           RPE:           SOB:   1312-2330ft normal 172ft MCID <1063ft 2x more likely to fall   Berg  33/56  <45 high risk of falls <40 almost 100% fall risk       GAIT: Distance walked: 80 feet  Assistive device utilized: None Level of assistance: SBA Comments: reports it felt hard - I normally hold onto somebody or use the walker  Walks with consistent knee flexion even with stance   PATIENT EDUCATION:  Education details: POC Person educated: Patient and Child(ren) Education method: Explanation Education comprehension: verbalized understanding  with use of interpreter     ASSESSMENT:  CLINICAL IMPRESSION: Patient is a 87 y.o. frail female who was seen today for physical therapy evaluation and treatment for debility and weakness after being diagnosed with metastatic renal cell carcinoma. Pt was not eating due to pain and has lost weight down to 70 pounds but reports she is able to eat better now.  She has general joint pain from immunotherapy infusions every month and back pain from recent compression fracture from fall noted in the chart but pt denies.  Pt would like to improve general strength and balance to improve  mobility at home. Her berg, sit to stand, TUG, and gait speed all demonstrate 100% risk of falls with her current scores.    OBJECTIVE IMPAIRMENTS Abnormal gait, decreased activity tolerance, decreased mobility, and difficulty walking.   ACTIVITY LIMITATIONS cleaning and meal prep.   PERSONAL  FACTORS Age, Fitness, and 1 comorbidity: palliative metastatic cancer  are also affecting patient's functional outcome.    REHAB POTENTIAL: Good  CLINICAL DECISION MAKING: Stable/uncomplicated  EVALUATION COMPLEXITY: Low   GOALS: Goals reviewed with patient? Yes   LONG TERM GOALS:   LTG Name Target Date Goal status  1 Pt will improve berg balance score to at least 40/56 Baseline:33 12/26/2021 INITIAL  2 Pt will improve 5x sit to stand to <30 seconds to demonstrate decreased fall risk Baseline:34 seconds 12/26/2021 INITIAL  3 Pt will complete 70min walk test in clinic to demonstrate improved endurance Baseline: walks 80ft around gym 12/26/2021 INITIAL  4 Pt will be ind with final HEP Baseline: 12/26/2021 INITIAL                  PLAN: PT FREQUENCY: 2x/week  PT DURATION: 8 weeks  PLANNED INTERVENTIONS: Therapeutic exercises, Therapeutic activity, Neuro Muscular re-education, Balance training, Gait training, Patient/Family education, and Joint mobilization  PLAN FOR NEXT SESSION: general TE to include UE/LE, starting with low level balance and activity tolerance   Coy [place]och[place], Adrian Prince, PT 10/31/2021, 8:57 AM

## 2021-11-02 ENCOUNTER — Other Ambulatory Visit (HOSPITAL_COMMUNITY): Payer: Self-pay

## 2021-11-02 ENCOUNTER — Ambulatory Visit: Payer: Medicaid Other

## 2021-11-02 ENCOUNTER — Other Ambulatory Visit: Payer: Self-pay

## 2021-11-02 DIAGNOSIS — Z7409 Other reduced mobility: Secondary | ICD-10-CM

## 2021-11-02 DIAGNOSIS — M75101 Unspecified rotator cuff tear or rupture of right shoulder, not specified as traumatic: Secondary | ICD-10-CM

## 2021-11-02 DIAGNOSIS — G8929 Other chronic pain: Secondary | ICD-10-CM

## 2021-11-02 DIAGNOSIS — M6281 Muscle weakness (generalized): Secondary | ICD-10-CM

## 2021-11-02 DIAGNOSIS — Z515 Encounter for palliative care: Secondary | ICD-10-CM | POA: Diagnosis not present

## 2021-11-02 NOTE — Therapy (Signed)
OUTPATIENT PHYSICAL THERAPY TREATMENT NOTE   Patient Name: Gail Fitzgerald MRN: [id number] DOB:1935-06-04, 87 y.o., female Today's Date: 11/02/2021  PCP: [name], MD REFERRING PROVIDER: Geralynn Rile*   PT End of Session - 11/02/21 0947     Visit Number 2    Number of Visits 17    Date for PT Re-Evaluation 12/26/21    Authorization Type medicaid healthy blue - update auth when approved    PT Start Time 0935    PT Stop Time 1011    PT Time Calculation (min) 36 min    Activity Tolerance Patient tolerated treatment well    Behavior During Therapy [facility] for tasks assessed/performed             Past Medical History:  Diagnosis Date   Hypertension    Past Surgical History:  Procedure Laterality Date   IR THORACENTESIS ASP PLEURAL SPACE W/IMG GUIDE  04/05/2021   THORACENTESIS N/A 04/12/2021   Procedure: Mathews Robinsons;  Surgeon: [name], DO;  Location: [place];  Service: Pulmonary;  Laterality: N/A;   Patient Active Problem List   Diagnosis Date Noted   Renal cell carcinoma, left ([place]) 05/09/2021   Encounter for antineoplastic immunotherapy 05/09/2021   S/P thoracentesis    Pleural effusion on left 03/10/2021    REFERRING DIAG: M19.90 (ICD-10-CM) - Unspecified osteoarthritis, unspecified site  THERAPY DIAG:  Muscle weakness (generalized)  Impaired mobility and endurance  Rotator cuff syndrome of right shoulder  Chronic right shoulder pain  PERTINENT HISTORY:   Hypertension  Renal cell carcinoma, left (HCC)  PRECAUTIONS: Active cancer  SUBJECTIVE: Patient states she is doing ok but very tired and pain is primarily in right shoulder.    PAIN:  Are you having pain? Yes NPRS scale: 7/10 Pain location: Shoulder Pain orientation: Right  PAIN TYPE: aching Pain description: constant  Aggravating factors: reaching, using arm Relieving factors: rest, meds     OBJECTIVE   COGNITION:            Overall cognitive status: Within  functional limits for tasks assessed    OBSERVATIONS / OTHER ASSESSMENTS: frail older women.  Walks slowly but no LOB. Holds onto arm of daughter in law   POSTURE: increased thoracic kyphosis     LYMPHEDEMA ASSESSMENTS:    CHEMOTHERAPY: currently immunotherapy 1x per month making pt feel weak after infusions and 1 daily pill      Test Score Norms  TUG 37ft 22.15 seconds High Fall risk >/= 14 sec Any fall risk: 12+ sec Risk of developing disability: 9+  Gait Speed (distance (m)/time) 0.13 m/s <0.43m/s household only 0.4-0.8 limited community 0.8-1.2 community 1.2 able to cross streets   Needs intervention for fall risk: <15m/s  5 time sit to stand (16) 34.6 sec - just felt muscles working >13.6 sec= increased disability >15sec predicts recurrent falls    30 second sit to stand (17)       <8 lower functional ability See below: minimum to maintain independent mobility   Grip Strength   Rt:        20                              Lt:15   Normal around 40lbs  6 min walk   Not if: resting HR>120, >180/162mmHg    Cardiac risk factors: peak HR<120-130BPM or rise of <20BPM   Beta blocker: HR not reliable use RPE and BP  Baseline HR:            BP:            O2:   Distance:   Post HR:            BP:            RPE:            SOB:    1312-2332ft normal 12ft MCID <1067ft 2x more likely to fall   Berg   33/56   <45 high risk of falls <40 almost 100% fall risk            GAIT: Distance walked: 80 feet  Assistive device utilized: None Level of assistance: SBA Comments: reports it felt hard - I normally hold onto somebody or use the walker  Walks with consistent knee flexion even with stance    PATIENT EDUCATION:  Education details: POC Person educated: Patient and Child(ren) Education method: Explanation Education comprehension: verbalized understanding  with use of interpreter         Today's Treatment:  11-02-21 NuStep x 5 min level  1 Seated: Seated rows with yellow tband (very minimal resistance) 2 x 10 D2 PNF x 10 each arm (no weight) Shoulder rolls x 10 Supine: Bilateral shoulder flexion x 10 Red plyo ball shoulder flexion x 10 Red plyo ball chest press x 10 Hands clasped thoracic rotation Sidelying: Open book stretch x 10 each side        ASSESSMENT:   CLINICAL IMPRESSION: Patient fatigued very easily but completed all tasks with rest breaks.  She reported some decrease in pain after session.  She is well motivated and should continue to do well with skilled PT to address postural strength and shoulder stability.      OBJECTIVE IMPAIRMENTS Abnormal gait, decreased activity tolerance, decreased mobility, and difficulty walking.    ACTIVITY LIMITATIONS cleaning and meal prep.    PERSONAL FACTORS Age, Fitness, and 1 comorbidity: palliative metastatic cancer  are also affecting patient's functional outcome.      REHAB POTENTIAL: Good   CLINICAL DECISION MAKING: Stable/uncomplicated   EVALUATION COMPLEXITY: Low     GOALS: Goals reviewed with patient? Yes     LONG TERM GOALS:    LTG Name Target Date Goal status  1 Pt will improve berg balance score to at least 40/56 Baseline:33 12/26/2021 INITIAL  2 Pt will improve 5x sit to stand to <30 seconds to demonstrate decreased fall risk Baseline:34 seconds 12/26/2021 INITIAL  3 Pt will complete 57min walk test in clinic to demonstrate improved endurance Baseline: walks 14ft around gym 12/26/2021 INITIAL  4 Pt will be ind with final HEP Baseline: 12/26/2021 INITIAL                               PLAN: PT FREQUENCY: 2x/week   PT DURATION: 8 weeks   PLANNED INTERVENTIONS: Therapeutic exercises, Therapeutic activity, Neuro Muscular re-education, Balance training, Gait training, Patient/Family education, and Joint mobilization   PLAN FOR NEXT SESSION: Continue general TE to include UE/LE, starting with low level balance and activity  tolerance    Daryll Spisak B. Amauri Medellin, PT 11/02/2309:06 AM

## 2021-11-06 ENCOUNTER — Other Ambulatory Visit (HOSPITAL_COMMUNITY): Payer: Self-pay

## 2021-11-07 ENCOUNTER — Other Ambulatory Visit: Payer: Self-pay

## 2021-11-07 ENCOUNTER — Ambulatory Visit: Payer: Medicaid Other | Attending: Nurse Practitioner

## 2021-11-07 DIAGNOSIS — M6281 Muscle weakness (generalized): Secondary | ICD-10-CM | POA: Diagnosis present

## 2021-11-07 DIAGNOSIS — M25552 Pain in left hip: Secondary | ICD-10-CM | POA: Insufficient documentation

## 2021-11-07 DIAGNOSIS — M75101 Unspecified rotator cuff tear or rupture of right shoulder, not specified as traumatic: Secondary | ICD-10-CM | POA: Insufficient documentation

## 2021-11-07 DIAGNOSIS — Z7409 Other reduced mobility: Secondary | ICD-10-CM | POA: Insufficient documentation

## 2021-11-07 DIAGNOSIS — M25511 Pain in right shoulder: Secondary | ICD-10-CM | POA: Insufficient documentation

## 2021-11-07 DIAGNOSIS — G8929 Other chronic pain: Secondary | ICD-10-CM | POA: Diagnosis present

## 2021-11-07 NOTE — Therapy (Signed)
?OUTPATIENT PHYSICAL THERAPY TREATMENT NOTE ? ? ?Patient Name: Gail Fitzgerald ?MRN: [id number] ?DOB:Jul 04, 1935, 87 y.o., female ?Today's Date: 11/07/2021 ? ?PCP: [name], MD ?REFERRING PROVIDER: Geralynn Rile* ? ? PT End of Session - 11/07/21 1733   ? ? Visit Number 3   ? Number of Visits 17   ? Date for PT Re-Evaluation 12/26/21   ? Authorization Type medicaid healthy blue - update auth when approved   ? PT Start Time 1440   ? PT Stop Time 1525   ? PT Time Calculation (min) 45 min   ? Activity Tolerance Patient tolerated treatment well   ? Behavior During Therapy [facility] for tasks assessed/performed   ? ?  ?  ? ?  ? ? ? ?Past Medical History:  ?Diagnosis Date  ? Hypertension   ? ?Past Surgical History:  ?Procedure Laterality Date  ? IR THORACENTESIS ASP PLEURAL SPACE W/IMG GUIDE  04/05/2021  ? THORACENTESIS N/A 04/12/2021  ? Procedure: THORACENTESIS;  Surgeon: [name], DO;  Location: [facility];  Service: Pulmonary;  Laterality: N/A;  ? ?Patient Active Problem List  ? Diagnosis Date Noted  ? Renal cell carcinoma, left ([place]) 05/09/2021  ? Encounter for antineoplastic immunotherapy 05/09/2021  ? S/P thoracentesis   ? Pleural effusion on left 03/10/2021  ? ? ?REFERRING DIAG: M19.90 (ICD-10-CM) - Unspecified osteoarthritis, unspecified site ? ?THERAPY DIAG:  ?Muscle weakness (generalized) ? ?Impaired mobility and endurance ? ?PERTINENT HISTORY:  ? Hypertension  ?Renal cell carcinoma, left (HCC) ? ?PRECAUTIONS: Active cancer ? ?SUBJECTIVE: Patient states she is doing good today.  Continued right shoulder pain but not as bad and not constant.  "I am eating better and that is helping"  ? ?PAIN:  ?Are you having pain? Yes ?NPRS scale: 0/10 ?Pain location: Shoulder ?Pain orientation: Right  ?PAIN TYPE: aching ?Pain description: intermittant  ?Aggravating factors: reaching, using arm ?Relieving factors: rest, meds ? ? ? ? ?OBJECTIVE ?  ?COGNITION: ?           Overall cognitive status: Within functional  limits for tasks assessed  ?  ?OBSERVATIONS / OTHER ASSESSMENTS: frail older women.  Walks slowly but no LOB. Holds onto arm of daughter in law ?  ?POSTURE: increased thoracic kyphosis ?  ?  ?LYMPHEDEMA ASSESSMENTS:  ?  ?CHEMOTHERAPY: currently immunotherapy 1x per month making pt feel weak after infusions and 1 daily pill  ?  ?  ?Test Score Norms  ?TUG 16ft 22.15 seconds High Fall risk >/= 14 sec ?Any fall risk: 12+ sec ?Risk of developing disability: 9+  ?Gait Speed (distance (m)/time) 0.13 m/s <0.11m/s household only ?0.4-0.8 limited community ?0.8-1.2 community ?1.2 able to cross streets ?  ?Needs intervention for fall risk: <46m/s  ?5 time sit to stand (16?) 34.6 sec - just felt muscles working >13.6 sec= increased disability ?>15sec predicts recurrent falls ?   ?30 second sit to stand (17?)   ?  ?  <8 lower functional ability ?See below: minimum to maintain independent mobility   ?Grip Strength   ?Rt:        20                              Lt:15 ?  Normal around 40lbs  ?6 min walk ?  ?Not if: resting HR>120, >180/115mmHg  ?  ?Cardiac risk factors: peak HR<120-130BPM or rise of <20BPM ?  ?Beta blocker: HR not reliable use RPE and BP ?  Baseline HR: ?  ?         BP: ?  ?         O2: ?  ?Distance: ?  ?Post HR: ?  ?         BP: ?  ?         RPE: ?  ?         SOB:  ?  1312-2315ft normal ?166ft MCID ?<1074ft 2x more likely to fall   ?Berg   ?33/56 ?  <45 high risk of falls ?<40 almost 100% fall risk  ?  ?  ?  ?  ?  ?GAIT: ?Distance walked: 0 feet  ?Assistive device utilized: None ?Level of assistance: SBA ?Comments: na  ?  ?PATIENT EDUCATION:  ?Education details: none today ?Person educated: Patient and Child(ren) ?Education method: Explanation ?Education comprehension: verbalized understanding  with use of interpreter    ?     ?Today's Treatment:  11-07-21 ?NuStep x 5 min level 1 ?Seated: ?Seated rows with yellow tband (very minimal resistance) 2 x 10 ?Shoulder rolls x 10 ?Hamstring curls x 10 each (yellow tband) ?LAQ  2 x 10 both 1.5 lb ankle weight ?March x 20 with 1.5 ankle weight  (alternating) ?Sit to stand x 10 ?Clamshell with yellow tband 2 x 10 ?Supine: ?Bilateral shoulder flexion x 10 ?Red plyo ball shoulder flexion x 10 ?Red plyo ball chest press x 10 ?Seated diagonals (hip to shoulder with red plyo ball) x 10 each side ?Step up and down on/off balance pad x 10 ?Marching on balance pad x 20 ?Mini squats on balance pad x 20 ? ?Today's Treatment:  11-02-21 ?NuStep x 5 min level 1 ?Seated: ?Seated rows with yellow tband (very minimal resistance) 2 x 10 ?D2 PNF x 10 each arm (no weight) ?Shoulder rolls x 10 ?Supine: ?Bilateral shoulder flexion x 10 ?Red plyo ball shoulder flexion x 10 ?Red plyo ball chest press x 10 ?Hands clasped thoracic rotation ?Sidelying: ?Open book stretch x 10 each side ? ?  ?  ?  ?ASSESSMENT: ?  ?CLINICAL IMPRESSION: ?Patient was able to do several more exercises today with min rest breaks and no pain.  She demonstrates improved right shoulder flexion.  She was able to do balance pad activities as well with minimal loss of balance with min assist.  She would benefit from continued skilled PT for UE and LE strength as well as balance and gait training to reduce fall risk.     ?  ?OBJECTIVE IMPAIRMENTS Abnormal gait, decreased activity tolerance, decreased mobility, and difficulty walking.  ?  ?ACTIVITY LIMITATIONS cleaning and meal prep.  ?  ?PERSONAL FACTORS Age, Fitness, and 1 comorbidity: palliative metastatic cancer  are also affecting patient's functional outcome.  ?  ?  ?REHAB POTENTIAL: Good ?  ?CLINICAL DECISION MAKING: Stable/uncomplicated ?  ?EVALUATION COMPLEXITY: Low ?  ?  ?GOALS: ?Goals reviewed with patient? Yes ?  ?  ?LONG TERM GOALS:  ?  ?LTG Name Target Date Goal status  ?1 Pt will improve berg balance score to at least 40/56 ?Baseline:33 12/26/2021 INITIAL  ?2 Pt will improve 5x sit to stand to <30 seconds to demonstrate decreased fall risk ?Baseline:34 seconds 12/26/2021 INITIAL  ?3 Pt  will complete 6min walk test in clinic to demonstrate improved endurance ?Baseline: walks 22ft around gym 12/26/2021 INITIAL  ?4 Pt will be ind with final HEP ?Baseline: 12/26/2021 INITIAL  ?         ?         ?         ?  ?  PLAN: ?PT FREQUENCY: 2x/week ?  ?PT DURATION: 8 weeks ?  ?PLANNED INTERVENTIONS: Therapeutic exercises, Therapeutic activity, Neuro Muscular re-education, Balance training, Gait training, Patient/Family education, and Joint mobilization ?  ?PLAN FOR NEXT SESSION: Continue general TE to include UE/LE, progress low level balance and activity tolerance ? ? ? ?Anderson Malta B. Altha Sweitzer, PT ?03/01/235:41 PM  ? ?   ?

## 2021-11-09 ENCOUNTER — Other Ambulatory Visit: Payer: Self-pay

## 2021-11-09 ENCOUNTER — Ambulatory Visit: Payer: Medicaid Other

## 2021-11-09 DIAGNOSIS — M75101 Unspecified rotator cuff tear or rupture of right shoulder, not specified as traumatic: Secondary | ICD-10-CM

## 2021-11-09 DIAGNOSIS — M25552 Pain in left hip: Secondary | ICD-10-CM

## 2021-11-09 DIAGNOSIS — G8929 Other chronic pain: Secondary | ICD-10-CM

## 2021-11-09 DIAGNOSIS — Z7409 Other reduced mobility: Secondary | ICD-10-CM

## 2021-11-09 DIAGNOSIS — M6281 Muscle weakness (generalized): Secondary | ICD-10-CM

## 2021-11-09 NOTE — Therapy (Signed)
?OUTPATIENT PHYSICAL THERAPY TREATMENT NOTE ? ? ?Patient Name: Gail Fitzgerald ?MRN: [id number] ?DOB:1935-03-11, 87 y.o., female ?Today's Date: 11/09/2021 ? ?PCP: [name], MD ?REFERRING PROVIDER: Geralynn Rile* ? ? PT End of Session - 11/09/21 0935   ? ? Visit Number 4   ? Number of Visits 17   ? Date for PT Re-Evaluation 12/26/21   ? Authorization Type medicaid healthy blue - update auth when approved   ? PT Start Time 0930   ? Activity Tolerance Patient tolerated treatment well   ? Behavior During Therapy [facility] for tasks assessed/performed   ? ?  ?  ? ?  ? ? ? ?Past Medical History:  ?Diagnosis Date  ? Hypertension   ? ?Past Surgical History:  ?Procedure Laterality Date  ? IR THORACENTESIS ASP PLEURAL SPACE W/IMG GUIDE  04/05/2021  ? THORACENTESIS N/A 04/12/2021  ? Procedure: THORACENTESIS;  Surgeon: [name], DO;  Location: [facility];  Service: Pulmonary;  Laterality: N/A;  ? ?Patient Active Problem List  ? Diagnosis Date Noted  ? Renal cell carcinoma, left ([place]) 05/09/2021  ? Encounter for antineoplastic immunotherapy 05/09/2021  ? S/P thoracentesis   ? Pleural effusion on left 03/10/2021  ? ? ?REFERRING DIAG: M19.90 (ICD-10-CM) - Unspecified osteoarthritis, unspecified site ? ?THERAPY DIAG:  ?Impaired mobility and endurance ? ?Muscle weakness (generalized) ? ?Rotator cuff syndrome of right shoulder ? ?Chronic right shoulder pain ? ?Lateral pain of left hip ? ?PERTINENT HISTORY:  ? Hypertension  ?Renal cell carcinoma, left (HCC) ? ?PRECAUTIONS: Active cancer ? ?SUBJECTIVE: Patient states she is doing ok today.  My blood pressure was up this morning.  I feel fine but it was elevated slightly.   ? ?PAIN:  ?Are you having pain? Yes ?NPRS scale: 0/10 ?Pain location: Shoulder ?Pain orientation: Right  ?PAIN TYPE: aching ?Pain description: intermittant  ?Aggravating factors: reaching, using arm ?Relieving factors: rest, meds ? ? ? ? ?OBJECTIVE ?  ?COGNITION: ?           Overall cognitive status:  Within functional limits for tasks assessed  ?  ?OBSERVATIONS / OTHER ASSESSMENTS: frail older women.  Walks slowly but no LOB. Holds onto arm of daughter in law ?  ?POSTURE: increased thoracic kyphosis ?  ?  ?LYMPHEDEMA ASSESSMENTS:  ?  ?CHEMOTHERAPY: currently immunotherapy 1x per month making pt feel weak after infusions and 1 daily pill  ?  ?  ?Test Score Norms  ?TUG 31ft 22.15 seconds High Fall risk >/= 14 sec ?Any fall risk: 12+ sec ?Risk of developing disability: 9+  ?Gait Speed (distance (m)/time) 0.13 m/s <0.89m/s household only ?0.4-0.8 limited community ?0.8-1.2 community ?1.2 able to cross streets ?  ?Needs intervention for fall risk: <44m/s  ?5 time sit to stand (16?) 34.6 sec - just felt muscles working >13.6 sec= increased disability ?>15sec predicts recurrent falls ?   ?30 second sit to stand (17?)   ?  ?  <8 lower functional ability ?See below: minimum to maintain independent mobility   ?Grip Strength   ?Rt:        20                              Lt:15 ?  Normal around 40lbs  ?6 min walk ?  ?Not if: resting HR>120, >180/179mmHg  ?  ?Cardiac risk factors: peak HR<120-130BPM or rise of <20BPM ?  ?Beta blocker: HR not reliable use RPE and BP ?  Baseline  HR: ?  ?         BP: 143/85 ?  ?         O2: ?  ?Distance: ?  ?Post HR: ?  ?         BP: ?  ?         RPE: ?  ?         SOB:  ?  1312-2341ft normal ?159ft MCID ?<1017ft 2x more likely to fall   ?Berg   ?33/56 ?  <45 high risk of falls ?<40 almost 100% fall risk  ?  ?  ?  ?  ?  ?GAIT: ?Distance walked: 0 feet  ?Assistive device utilized: None ?Level of assistance: SBA ?Comments: na  ?  ?PATIENT EDUCATION:  ?Education details: none today ?Person educated: Patient and Child(ren) ?Education method: Explanation ?Education comprehension: verbalized understanding  with use of interpreter    ?    ?Today's Treatment:  11-09-21 ?NuStep x 5 min level 1 ?Seated: ?Seated rows with yellow tband (very minimal resistance) 2 x 10 ?Shoulder rolls x 10 ?Hamstring curls x 10  each (yellow tband) ?LAQ 2 x 10 both 2 lb ankle weight ?March x 20 with 2 lb ankle weight  (alternating) ?Sit to stand x 10 with 5 lb kb ?Clamshell with yellow tband 2 x 10 ?Bilateral shoulder flexion x 10 with 1 lb dumbells ?Bilateral bicep curls 2 x 10 with 1 lb dumbells ?Tricep press with yellow tband 2 x 10 each UE ?Seated diagonals (hip to shoulder with 5 lb kb) x 10 each side ?Standing: ?Step up and down on/off balance pad x 10 ?Marching on balance pad x 20 ?Mini squats on balance pad x 20 ? ?Today's Treatment:  11-02-21 ?NuStep x 5 min level 1 ?Seated: ?Seated rows with yellow tband (very minimal resistance) 2 x 10 ?D2 PNF x 10 each arm (no weight) ?Shoulder rolls x 10 ?Supine: ?Bilateral shoulder flexion x 10 ?Red plyo ball shoulder flexion x 10 ?Red plyo ball chest press x 10 ?Hands clasped thoracic rotation ?Sidelying: ?Open book stretch x 10 each side ? ?  ?  ?  ?ASSESSMENT: ?  ?CLINICAL IMPRESSION: ?Patient was able to tolerated increased resistance and new activities today with min rest breaks and no pain.  She demonstrates improved balance on all balance tasks with decreased loss of balance.  She would benefit from continued skilled PT for UE and LE strength as well as balance and gait training to reduce fall risk.     ?  ?OBJECTIVE IMPAIRMENTS Abnormal gait, decreased activity tolerance, decreased mobility, and difficulty walking.  ?  ?ACTIVITY LIMITATIONS cleaning and meal prep.  ?  ?PERSONAL FACTORS Age, Fitness, and 1 comorbidity: palliative metastatic cancer  are also affecting patient's functional outcome.  ?  ?  ?REHAB POTENTIAL: Good ?  ?CLINICAL DECISION MAKING: Stable/uncomplicated ?  ?EVALUATION COMPLEXITY: Low ?  ?  ?GOALS: ?Goals reviewed with patient? Yes ?  ?  ?LONG TERM GOALS:  ?  ?LTG Name Target Date Goal status  ?1 Pt will improve berg balance score to at least 40/56 ?Baseline:33 12/26/2021 INITIAL  ?2 Pt will improve 5x sit to stand to <30 seconds to demonstrate decreased fall  risk ?Baseline:34 seconds 12/26/2021 INITIAL  ?3 Pt will complete 81min walk test in clinic to demonstrate improved endurance ?Baseline: walks 2ft around gym 12/26/2021 INITIAL  ?4 Pt will be ind with final HEP ?Baseline: 12/26/2021 INITIAL  ?         ?         ?         ?  ?  PLAN: ?PT FREQUENCY: 2x/week ?  ?PT DURATION: 8 weeks ?  ?PLANNED INTERVENTIONS: Therapeutic exercises, Therapeutic activity, Neuro Muscular re-education, Balance training, Gait training, Patient/Family education, and Joint mobilization ?  ?PLAN FOR NEXT SESSION: Continue general TE to include UE/LE, progress low level balance and activity tolerance ? ? ? ?Anderson Malta B. Johsua Shevlin, PT ?11/09/2308:14 AM  ? ?   ?

## 2021-11-13 ENCOUNTER — Encounter: Payer: Self-pay | Admitting: Internal Medicine

## 2021-11-13 ENCOUNTER — Inpatient Hospital Stay: Payer: Medicaid Other

## 2021-11-13 ENCOUNTER — Other Ambulatory Visit: Payer: Self-pay

## 2021-11-13 ENCOUNTER — Inpatient Hospital Stay (HOSPITAL_BASED_OUTPATIENT_CLINIC_OR_DEPARTMENT_OTHER): Payer: Medicaid Other | Admitting: Nurse Practitioner

## 2021-11-13 ENCOUNTER — Encounter: Payer: Self-pay | Admitting: Nurse Practitioner

## 2021-11-13 ENCOUNTER — Inpatient Hospital Stay: Payer: Medicaid Other | Attending: Internal Medicine

## 2021-11-13 ENCOUNTER — Inpatient Hospital Stay: Payer: Medicaid Other | Admitting: Internal Medicine

## 2021-11-13 ENCOUNTER — Inpatient Hospital Stay: Payer: Medicaid Other | Admitting: Dietician

## 2021-11-13 VITALS — BP 144/64 | HR 58 | Temp 98.0°F | Resp 18 | Ht 61.0 in | Wt 88.5 lb

## 2021-11-13 DIAGNOSIS — Z515 Encounter for palliative care: Secondary | ICD-10-CM | POA: Diagnosis not present

## 2021-11-13 DIAGNOSIS — Z5112 Encounter for antineoplastic immunotherapy: Secondary | ICD-10-CM | POA: Insufficient documentation

## 2021-11-13 DIAGNOSIS — C642 Malignant neoplasm of left kidney, except renal pelvis: Secondary | ICD-10-CM

## 2021-11-13 DIAGNOSIS — Z79899 Other long term (current) drug therapy: Secondary | ICD-10-CM | POA: Insufficient documentation

## 2021-11-13 DIAGNOSIS — R630 Anorexia: Secondary | ICD-10-CM

## 2021-11-13 DIAGNOSIS — G4700 Insomnia, unspecified: Secondary | ICD-10-CM | POA: Diagnosis not present

## 2021-11-13 DIAGNOSIS — R530 Neoplastic (malignant) related fatigue: Secondary | ICD-10-CM

## 2021-11-13 DIAGNOSIS — R634 Abnormal weight loss: Secondary | ICD-10-CM

## 2021-11-13 LAB — CMP (CANCER CENTER ONLY)
ALT: 8 U/L (ref 0–44)
AST: 18 U/L (ref 15–41)
Albumin: 3.6 g/dL (ref 3.5–5.0)
Alkaline Phosphatase: 67 U/L (ref 38–126)
Anion gap: 8 (ref 5–15)
BUN: 14 mg/dL (ref 8–23)
CO2: 23 mmol/L (ref 22–32)
Calcium: 8.8 mg/dL — ABNORMAL LOW (ref 8.9–10.3)
Chloride: 92 mmol/L — ABNORMAL LOW (ref 98–111)
Creatinine: 0.55 mg/dL (ref 0.44–1.00)
GFR, Estimated: >60 mL/min (ref >60)
Glucose, Bld: 167 mg/dL — ABNORMAL HIGH (ref 70–99)
Potassium: 4 mmol/L (ref 3.5–5.1)
Sodium: 123 mmol/L — ABNORMAL LOW (ref 135–145)
Total Bilirubin: 0.5 mg/dL (ref 0.3–1.2)
Total Protein: 6.4 g/dL — ABNORMAL LOW (ref 6.5–8.1)

## 2021-11-13 LAB — CBC WITH DIFFERENTIAL (CANCER CENTER ONLY)
Abs Immature Granulocytes: 0.02 10*3/uL (ref 0.00–0.07)
Basophils Absolute: 0.1 10*3/uL (ref 0.0–0.1)
Basophils Relative: 1 %
Eosinophils Absolute: 0 10*3/uL (ref 0.0–0.5)
Eosinophils Relative: 0 %
HCT: 31.1 % — ABNORMAL LOW (ref 36.0–46.0)
Hemoglobin: 10.6 g/dL — ABNORMAL LOW (ref 12.0–15.0)
Immature Granulocytes: 0 %
Lymphocytes Relative: 18 %
Lymphs Abs: 1.5 10*3/uL (ref 0.7–4.0)
MCH: 30.2 pg (ref 26.0–34.0)
MCHC: 34.1 g/dL (ref 30.0–36.0)
MCV: 88.6 fL (ref 80.0–100.0)
Monocytes Absolute: 0.9 10*3/uL (ref 0.1–1.0)
Monocytes Relative: 10 %
Neutro Abs: 6.2 10*3/uL (ref 1.7–7.7)
Neutrophils Relative %: 71 %
Platelet Count: 195 10*3/uL (ref 150–400)
RBC: 3.51 MIL/uL — ABNORMAL LOW (ref 3.87–5.11)
RDW: 20.3 % — ABNORMAL HIGH (ref 11.5–15.5)
WBC Count: 8.7 10*3/uL (ref 4.0–10.5)
nRBC: 0 % (ref 0.0–0.2)

## 2021-11-13 LAB — TSH: TSH: 1.57 u[IU]/mL (ref 0.308–3.960)

## 2021-11-13 MED ORDER — SODIUM CHLORIDE 0.9 % IV SOLN
480.0000 mg | Freq: Once | INTRAVENOUS | Status: AC
  Administered 2021-11-13: 480 mg via INTRAVENOUS
  Filled 2021-11-13: qty 48

## 2021-11-13 MED ORDER — FERROUS SULFATE 325 (65 FE) MG PO TBEC: 325.0000 mg | DELAYED_RELEASE_TABLET | Freq: Every day | ORAL | 3 refills | Status: DC

## 2021-11-13 MED ORDER — SODIUM CHLORIDE 0.9 % IV SOLN: Freq: Once | INTRAVENOUS | Status: AC

## 2021-11-13 NOTE — Patient Instructions (Signed)
[facility]  Discharge Instructions: ?Thank you for choosing [facility] to provide your oncology and hematology care.  ? ?If you have a lab appointment with the [facility], please go directly to the [facility] and check in at the registration area. ?  ?Wear comfortable clothing and clothing appropriate for easy access to any Portacath or PICC line.  ? ?We strive to give you quality time with your provider. You may need to reschedule your appointment if you arrive late (15 or more minutes).  Arriving late affects you and other patients whose appointments are after yours.  Also, if you miss three or more appointments without notifying the office, you may be dismissed from the clinic at the provider?s discretion.    ?  ?For prescription refill requests, have your pharmacy contact our office and allow 72 hours for refills to be completed.   ? ?Today you received the following chemotherapy and/or immunotherapy agent: Opdivo    ?  ?To help prevent nausea and vomiting after your treatment, we encourage you to take your nausea medication as directed. ? ?BELOW ARE SYMPTOMS THAT SHOULD BE REPORTED IMMEDIATELY: ?*FEVER GREATER THAN 100.4 F (38 ?C) OR HIGHER ?*CHILLS OR SWEATING ?*NAUSEA AND VOMITING THAT IS NOT CONTROLLED WITH YOUR NAUSEA MEDICATION ?*UNUSUAL SHORTNESS OF BREATH ?*UNUSUAL BRUISING OR BLEEDING ?*URINARY PROBLEMS (pain or burning when urinating, or frequent urination) ?*BOWEL PROBLEMS (unusual diarrhea, constipation, pain near the anus) ?TENDERNESS IN MOUTH AND THROAT WITH OR WITHOUT PRESENCE OF ULCERS (sore throat, sores in mouth, or a toothache) ?UNUSUAL RASH, SWELLING OR PAIN  ?UNUSUAL VAGINAL DISCHARGE OR ITCHING  ? ?Items with * indicate a potential emergency and should be followed up as soon as possible or go to the Emergency Department if any problems should occur. ? ?Please show the CHEMOTHERAPY ALERT CARD or IMMUNOTHERAPY ALERT CARD at check-in to the  Emergency Department and triage nurse. ? ?Should you have questions after your visit or need to cancel or reschedule your appointment, please contact [facility]  Dept: [phone]  and follow the prompts.  Office hours are 8:00 a.m. to 4:30 p.m. Monday - Friday. Please note that voicemails left after 4:00 p.m. may not be returned until the following business day.  We are closed weekends and major holidays. You have access to a nurse at all times for urgent questions. Please call the main number to the clinic Dept: [phone] and follow the prompts. ? ? ?For any non-urgent questions, you may also contact your provider using MyChart. We now offer e-Visits for anyone 91 and older to request care online for non-urgent symptoms. For details visit [email or web address]. ?  ?Also download the MyChart app! Go to the app store, search "MyChart", open the app, select [facility], and log in with your MyChart username and password. ? ?Due to Covid, a mask is required upon entering the hospital/clinic. If you do not have a mask, one will be given to you upon arrival. For doctor visits, patients may have 1 support person aged 74 or older with them. For treatment visits, patients cannot have anyone with them due to current Covid guidelines and our immunocompromised population.  ? ?

## 2021-11-13 NOTE — Progress Notes (Signed)
[place]  Telephone:(336) [phone] Fax:(336) [phone]   Name: Gail Fitzgerald Date: 11/13/2021 MRN: [id number]  DOB: [birth year]  Patient Care Team: [name], MD as PCP - General (Family Medicine)    REASON FOR CONSULTATION: Gail Fitzgerald is a 87 y.o. 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  Now actively receiving Opdivo and Cabometyx.  Palliative ask to see for symptom management and goals of care.    SOCIAL HISTORY:     reports that she has never smoked. She has never used smokeless tobacco. She reports that she does not currently use alcohol. She reports that she does not currently use drugs.  ADVANCE DIRECTIVES:  Patient does not have advanced directives.  Does not wish to discuss.  CODE STATUS: Full code  PAST MEDICAL HISTORY: Past Medical History:  Diagnosis Date   Hypertension     PAST SURGICAL HISTORY:  Past Surgical History:  Procedure Laterality Date   IR THORACENTESIS ASP PLEURAL SPACE W/IMG GUIDE  04/05/2021   THORACENTESIS N/A 04/12/2021   Procedure: Mathews Robinsons;  Surgeon: [name], DO;  Location: [facility];  Service: Pulmonary;  Laterality: N/A;    HEMATOLOGY/ONCOLOGY HISTORY:  Oncology History  Renal cell carcinoma, left ([place])  05/09/2021 Initial Diagnosis   Renal cell carcinoma, left ([place])   05/22/2021 -  Chemotherapy   Patient is on Treatment Plan : Renal cell nivolumab + Ipilimumab q21d        ALLERGIES:  has no active allergies.  MEDICATIONS:  Current Outpatient Medications  Medication Sig Dispense Refill   Aflibercept (EYLEA IO) Inject 1 Dose into the eye every 30 (thirty) days. IO left eye monthly     amLODipine (NORVASC) 10 MG tablet Take 10 mg by mouth daily.     aspirin EC 81 MG  tablet Take 81 mg by mouth every evening. Swallow whole.     benazepril (LOTENSIN) 20 MG tablet Take 20 mg by mouth daily.     cabozantinib (CABOMETYX) 40 MG tablet Take 1 tablet (40 mg total) by mouth daily. Take on an empty stomach, 1 hour before or 2 hours after meals. 30 tablet 2   celecoxib (CELEBREX) 100 MG capsule Take 1 capsule (100 mg total) by mouth 2 (two) times daily. 60 capsule 0   ferrous sulfate 325 (65 FE) MG EC tablet Take 1 tablet (325 mg total) by mouth daily with breakfast. 30 tablet 3   ibuprofen (ADVIL) 200 MG tablet Take 400 mg by mouth every 8 (eight) hours as needed (pain.).     LORazepam (ATIVAN) 0.5 MG tablet 1 tablet p.o. 30 minutes before the MRI.  May repeat once if needed. 2 tablet 0   Magnesium 400 MG CAPS Take 400 mg by mouth See admin instructions. Takes 1 tablet (400 mg) by mouth once daily 10 days on and 10 days off.     metoprolol succinate (TOPROL-XL) 100 MG 24 hr tablet Take 50 mg by mouth in the morning and at bedtime.     mirtazapine (REMERON) 7.5 MG tablet TAKE 1 TABLET BY MOUTH AT BEDTIME. (Patient not taking: Reported on 11/13/2021) 90 tablet 1   Misc. Devices (WHEELCHAIR) MISC Light weight     OVER THE COUNTER MEDICATION Take 1 tablet by mouth daily as needed (for stomach discomfort). Allochol - (Herbal Supplement) Multivitamin with : Magnesium,  potassium, garlic and charcoal. (Russian Medication)     oxyCODONE-acetaminophen (PERCOCET/ROXICET) 5-325 MG tablet Take 1 tablet by mouth every 8 (eight) hours as needed for severe pain. 30 tablet 0   pantoprazole (PROTONIX) 40 MG tablet TAKE 1 TABLET BY MOUTH EVERY DAY 30 tablet 1   prochlorperazine (COMPAZINE) 10 MG tablet Take 1 tablet (10 mg total) by mouth every 6 (six) hours as needed for nausea or vomiting. 30 tablet 2   simvastatin (ZOCOR) 20 MG tablet Take 20 mg by mouth every evening.     UNABLE TO FIND Take 1 tablet by mouth daily as needed (chest pain (angina)). Med Name: Validol (Menthyl isovalerate and  menthol) (Russian Medication)     No current facility-administered medications for this visit.    VITAL SIGNS: BP (!) 144/64 (BP Location: Left Arm, Patient Position: Sitting)    Pulse (!) 58    Temp 98 F (36.7 C) (Oral)    Resp 18    Ht 5\' 1"  (1.549 m)    Wt 88 lb 8 oz (40.1 kg)    SpO2 100%    BMI 16.72 kg/m  Filed Weights   11/13/21 1058  Weight: 88 lb 8 oz (40.1 kg)    Estimated body mass index is 16.72 kg/m as calculated from the following:   Height as of this encounter: 5\' 1"  (1.549 m).   Weight as of this encounter: 88 lb 8 oz (40.1 kg).  PERFORMANCE STATUS (ECOG) : 2 - Symptomatic, <50% confined to bed  Physical Exam General: NAD, ambulatory  Cardiovascular: RRR Pulmonary: clear ant fields Abdomen: soft, nontender, + bowel sounds Extremities: no edema, no joint deformities Neurological: AAOx4 with a [place] interpreter  IMPRESSION:  Gail Fitzgerald presents to clinic today for follow-up. Gail Fitzgerald ([place] Interpretor) present. Complains of some fatigue and weakness. Is ambulatory with stand-by assistance. Is scheduled for her next chemo cycle today and also to see Dr. Julien Nordmann.   Gail Fitzgerald states she had diarrhea for 2-3 days however this has improved. Reports her son did give her something OTC but unsure what it was. Endorses ongoing fatigue. She is sleeping during the night but wakes up every few hours to go to the bathroom. Naps throughout the day.   Neoplasm related pain [name] states her pain is somewhat improved. Endorses occasional headaches and generalized aching. This is well managed with Tylenol as she has expressed wishes not to take Oxycodone unless she absolutely has to. Is tolerating Celebrex as prescribed. Will plan to closely monitor in collaboration with Oncology team.   Decreased appetite/weight loss  Her appetite continues to be a challenge. She shares that she discontinued use of mirtazapine.  She cannot justify reasoning behind discontinuing and denies  any associated reactions or side effects.  Her current weight is 88.8 pounds down from 90.2 lbs on 2/13, 97 lbs on 08/28/21, and 114 lbs on 06/12/21.  She has no desire to eat and when she does she only takes a few bites or sips.  She is being followed by the dietitian.  We discussed eating some of her favorite foods, frequent snacking, small frequent meals daily versus trying to attempt large meals, and eating at the onset of hunger sensation of possible.  Ms. Gail Fitzgerald shares when she can tolerated she does enjoy fruits and vegetables.  Education provided on possibly adding protein powder to her fruits and making a smoothie.  She does not like the Ensure or boost reporting they are too sweet.    Education  provided on continuing mirtazapine to assist with appetite and added benefit of helping with her insomnia.  She verbalized understanding confirming she has medication in the home and will restart tonight.  We will continue to closely monitor and adjust as needed.  Would not increase dose at this time given patient has not taken in 1-2 weeks. She also complains of dry mouth more specifically first in the morning and late evening. Education provided on hard candies to increase salivation in addition to the use of Biotene.   I discussed the importance of continued conversation with family and their medical providers regarding overall plan of care and treatment options, ensuring decisions are within the context of the patients values and GOCs.  PLAN: Tylenol 1000mg  three times daily Continue Celebrex 200 mg daily.  Patient reports pain is improved.   Percocet every 8 hours as needed for breakthrough pain. Patient is not currently taking and not interested in opioids however appreciative it is available. Given level of pain/discomfort would consider more long-acting regimen to achieve better comfort. Will continue to support and closely evaluate respecting patient's wishes for other pharmacological and non  pharmacological interventions.   Biotene gum, mouthwash, toothpaste for dry mouth. Weight and appetite continues to be a challenge.  Patient has not taken mirtazapine in approximately 2 weeks.  Encouraged her to restart daily at bedtime for appetite stimulation.  Education also provided on increased protein intake with recommendations of adding protein powder to her fruits making a smoothie given she is unable to tolerate Ensure/boost. I will plan to see patient back in 3-4 weeks in collaboration to other oncology appointments.    Patient expressed understanding and was in agreement with this plan. She also understands that She can call the clinic at any time with any questions, concerns, or complaints.   Time Total: 25 min  Visit consisted of counseling and education dealing with the complex and emotionally intense issues of symptom management and palliative care in the setting of serious and potentially life-threatening illness.Greater than 50%  of this time was spent counseling and coordinating care related to the above assessment and plan.  Alda Lea, AGPCNP-BC  Palliative Medicine Team/ [facility]

## 2021-11-13 NOTE — Progress Notes (Signed)
Gail Fitzgerald Telephone:(336) [phone]   Fax:(336) [phone]  OFFICE PROGRESS NOTE  [name], MD [address] [place] [address]  DIAGNOSIS: Metastatic renal cell carcinoma presented with large left renal mass in addition to significant left hemothorax pleural-based metastasis as well as left hilar and mediastinal lymphadenopathy diagnosed in August 2022.  Biomarker Findings Microsatellite status - MS-Stable Tumor Mutational Burden - 2 Muts/Mb Genomic Findings For a complete list of the genes assayed, please refer to the Appendix. MTAP loss CDKN2A/B CDKN2A loss 8 Disease relevant genes with no reportable Alteratio  PD-L1 expression 0%  PRIOR THERAPY: Systemic immunotherapy with ipilimumab 1 mg/KG in addition to nivolumab 3 mg/KG every 3 weeks for 4 cycles followed by maintenance treatment with nivolumab.  Status post 3 cycles.  First dose started on 05/22/2021.  This treatment was discontinued secondary to disease progression.   CURRENT THERAPY: Opdivo 480 Mg IV every 4 weeks with Cabometyx 40 mg p.o. daily.  First dose August 21, 2021.  Status post 3 month of treatment  INTERVAL HISTORY: Gail Fitzgerald 87 y.o. female returns to the clinic today for follow-up visit accompanied by her [place] interpreter.  The patient continues to complain of fatigue and weakness.  She also had 2 days of several episodes of diarrhea and its improving.  She did not take any Imodium or other medication.  She has improvement of the pain on the left side of the chest but she continues to have arthralgia and intermittent headache.  She denied having any recent weight loss or night sweats.  She has no nausea, vomiting, abdominal pain or constipation.  She is here today for evaluation before starting cycle #4 of her treatment with Opdivo and Cabometyx  MEDICAL HISTORY: Past Medical History:  Diagnosis Date   Hypertension     ALLERGIES:  has no active  allergies.  MEDICATIONS:  Current Outpatient Medications  Medication Sig Dispense Refill   Misc. Devices (WHEELCHAIR) MISC Light weight     Aflibercept (EYLEA IO) Inject 1 Dose into the eye every 30 (thirty) days. IO left eye monthly     amLODipine (NORVASC) 10 MG tablet Take 10 mg by mouth daily.     aspirin EC 81 MG tablet Take 81 mg by mouth every evening. Swallow whole.     benazepril (LOTENSIN) 20 MG tablet Take 20 mg by mouth daily.     cabozantinib (CABOMETYX) 40 MG tablet Take 1 tablet (40 mg total) by mouth daily. Take on an empty stomach, 1 hour before or 2 hours after meals. 30 tablet 2   celecoxib (CELEBREX) 100 MG capsule Take 1 capsule (100 mg total) by mouth 2 (two) times daily. 60 capsule 0   ibuprofen (ADVIL) 200 MG tablet Take 400 mg by mouth every 8 (eight) hours as needed (pain.).     LORazepam (ATIVAN) 0.5 MG tablet 1 tablet p.o. 30 minutes before the MRI.  May repeat once if needed. 2 tablet 0   Magnesium 400 MG CAPS Take 400 mg by mouth See admin instructions. Takes 1 tablet (400 mg) by mouth once daily 10 days on and 10 days off.     metoprolol succinate (TOPROL-XL) 100 MG 24 hr tablet Take 50 mg by mouth in the morning and at bedtime.     mirtazapine (REMERON) 7.5 MG tablet TAKE 1 TABLET BY MOUTH AT BEDTIME. (Patient not taking: Reported on 11/13/2021) 90 tablet 1   OVER THE COUNTER MEDICATION Take 1  tablet by mouth daily as needed (for stomach discomfort). Allochol - (Herbal Supplement) Multivitamin with : Magnesium, potassium, garlic and charcoal. (Russian Medication)     oxyCODONE-acetaminophen (PERCOCET/ROXICET) 5-325 MG tablet Take 1 tablet by mouth every 8 (eight) hours as needed for severe pain. 30 tablet 0   pantoprazole (PROTONIX) 40 MG tablet TAKE 1 TABLET BY MOUTH EVERY DAY 30 tablet 1   prochlorperazine (COMPAZINE) 10 MG tablet Take 1 tablet (10 mg total) by mouth every 6 (six) hours as needed for nausea or vomiting. 30 tablet 2   simvastatin (ZOCOR) 20 MG  tablet Take 20 mg by mouth every evening.     UNABLE TO FIND Take 1 tablet by mouth daily as needed (chest pain (angina)). Med Name: Validol (Menthyl isovalerate and menthol) (Russian Medication)     No current facility-administered medications for this visit.    SURGICAL HISTORY:  Past Surgical History:  Procedure Laterality Date   IR THORACENTESIS ASP PLEURAL SPACE W/IMG GUIDE  04/05/2021   THORACENTESIS N/A 04/12/2021   Procedure: Mathews Robinsons;  Surgeon: [name], DO;  Location: [facility];  Service: Pulmonary;  Laterality: N/A;    REVIEW OF SYSTEMS:  Constitutional: positive for fatigue Eyes: negative Ears, nose, mouth, throat, and face: negative Respiratory: positive for pleurisy/chest pain Cardiovascular: negative Gastrointestinal: positive for diarrhea Genitourinary:negative Integument/breast: negative Hematologic/lymphatic: negative Musculoskeletal:positive for arthralgias Neurological: positive for headaches Behavioral/Psych: negative Endocrine: negative Allergic/Immunologic: negative   PHYSICAL EXAMINATION: General appearance: alert, cooperative, fatigued, and no distress Head: Normocephalic, without obvious abnormality, atraumatic Neck: no adenopathy, no JVD, supple, symmetrical, trachea midline, and thyroid not enlarged, symmetric, no tenderness/mass/nodules Lymph nodes: Cervical, supraclavicular, and axillary nodes normal. Resp: clear to auscultation bilaterally Back: symmetric, no curvature. ROM normal. No CVA tenderness. Cardio: regular rate and rhythm, S1, S2 normal, no murmur, click, rub or gallop GI: soft, non-tender; bowel sounds normal; no masses,  no organomegaly Extremities: extremities normal, atraumatic, no cyanosis or edema Neurologic: Alert and oriented X 3, normal strength and tone. Normal symmetric reflexes. Normal coordination and gait  ECOG PERFORMANCE STATUS: 1 - Symptomatic but completely ambulatory  There were no vitals taken for this  visit.  LABORATORY DATA: Lab Results  Component Value Date   WBC 8.7 11/13/2021   HGB 10.6 (L) 11/13/2021   HCT 31.1 (L) 11/13/2021   MCV 88.6 11/13/2021   PLT 195 11/13/2021      Chemistry      Component Value Date/Time   NA 123 (L) 11/13/2021 1031   K 4.0 11/13/2021 1031   CL 92 (L) 11/13/2021 1031   CO2 23 11/13/2021 1031   BUN 14 11/13/2021 1031   CREATININE 0.55 11/13/2021 1031      Component Value Date/Time   CALCIUM 8.8 (L) 11/13/2021 1031   ALKPHOS 67 11/13/2021 1031   AST 18 11/13/2021 1031   ALT 8 11/13/2021 1031   BILITOT 0.5 11/13/2021 1031       RADIOGRAPHIC STUDIES: No results found.  ASSESSMENT AND PLAN: This is a an 87 years old white female who originally from [place] with metastatic renal cell carcinoma presented with large left renal mass in addition to left hemithorax pleural-based metastasis as well as left hilar and mediastinal lymphadenopathy diagnosed in 2022.  Her molecular studies showed no actionable mutations and PD-L1 expression was negative. The patient had MRI of the brain performed recently that showed no evidence of metastatic disease to the brain. She started systemic treatment with immunotherapy with ipilimumab 1 mg/KG as well as  nivolumab 3 mg/KG every 3 weeks status post 4 cycles.  This treatment was discontinued secondary to disease progression. She is currently on treatment with a combination of nivolumab 480 Mg IV every 4 weeks in addition to Cabometyx 40 mg p.o. daily status post 3 cycles. The patient has been tolerating this treatment well with no concerning adverse effect except for arthralgia and mild fatigue.  She also has few episodes of diarrhea few days ago but this is improving. I recommended for her to continue her current treatment with nivolumab and Cabometyx with the same dose for now. I will see her back for follow-up visit in 4 weeks for evaluation with repeat CT scan of the chest, abdomen and pelvis for restaging of her  disease. For the anemia, I will start the patient on ferrous sulfate 325 mg p.o. daily and she was advised to increase her iron rich diet. For the hyponatremia, she was advised to continue with the fresh water restriction and increase the salt in her diet.  Her hyponatremia probably is worse with her recent diarrhea. The patient was advised to call immediately if she has any other concerning symptoms in the interval.  The patient voices understanding of current disease status and treatment options and is in agreement with the current care plan. The total time spent in the appointment was 30 minutes.  All questions were answered. The patient knows to call the clinic with any problems, questions or concerns. We can certainly see the patient much sooner if necessary.   Disclaimer: This note was dictated with voice recognition software. Similar sounding words can inadvertently be transcribed and may not be corrected upon review.

## 2021-11-13 NOTE — Progress Notes (Signed)
Nutrition Follow-up: ? ?Patient with metastatic renal cell carcinoma. She is currently receiving opdivo q4 weeks with daily carbometyx.  ?  ?Met with patient and interpretor during infusion. Patient is eating at visit. She is having liver mush wrapped in cheese that her son made for her. Patient has consumed ~50% of Ensure Plus this morning. Patient reports she has been going to physical therapy. She is feeling stronger. Patient has not tried [employer]. She reports it might be too sweet. She does not care for the sweet taste of Ensure, but trying to drink one everyday.  ? ? ?Medications:  ? ?Labs: Na 123, Glucose 167 ? ?Anthropometrics: Weight 88 lb 8 oz today decreased  ? ?2/07 - 90 lb  ?1/10 - 94 lb 12.8 oz ?12/20- 96 lb 14.4 oz ?12/6 - 99 lb 9.6 oz ?11/15 - 106 lb 3.2 oz ?10/25 - 109 lb 14.4 oz ? ? ?NUTRITION DIAGNOSIS: Unintentional weight loss continues  ? ? ?INTERVENTION:  ?Continue to encourage high calorie high protein foods ?Provided additional samples of [employer] supplement - educated supplements are plant based and may find them less sweet than Ensure  ?Encouraged added salt to foods ?Continue taking appetite stimulant ?  ? ?MONITORING, EVALUATION, GOAL: weight trends, intake  ? ? ?NEXT VISIT: Tuesday May 2 during infusion  ? ? ? ?

## 2021-11-14 ENCOUNTER — Ambulatory Visit: Payer: Medicaid Other

## 2021-11-14 DIAGNOSIS — M75101 Unspecified rotator cuff tear or rupture of right shoulder, not specified as traumatic: Secondary | ICD-10-CM

## 2021-11-14 DIAGNOSIS — M6281 Muscle weakness (generalized): Secondary | ICD-10-CM | POA: Diagnosis not present

## 2021-11-14 DIAGNOSIS — G8929 Other chronic pain: Secondary | ICD-10-CM

## 2021-11-14 DIAGNOSIS — Z7409 Other reduced mobility: Secondary | ICD-10-CM

## 2021-11-14 DIAGNOSIS — M25552 Pain in left hip: Secondary | ICD-10-CM

## 2021-11-14 NOTE — Therapy (Signed)
?OUTPATIENT PHYSICAL THERAPY TREATMENT NOTE ? ? ?Patient Name: Gail Fitzgerald ?MRN: [id number] ?DOB:Mar 08, 1935, 87 y.o., female ?Today's Date: 11/14/2021 ? ?PCP: [name], MD ?REFERRING PROVIDER: Geralynn Rile* ? ? PT End of Session - 11/14/21 1150   ? ? Visit Number 5   ? Number of Visits 17   ? Date for PT Re-Evaluation 12/26/21   ? Authorization Type medicaid healthy blue - update auth when approved   ? PT Start Time 1147   ? PT Stop Time 1230   ? PT Time Calculation (min) 43 min   ? Activity Tolerance Patient tolerated treatment well   ? Behavior During Therapy [facility] for tasks assessed/performed   ? ?  ?  ? ?  ? ? ? ?Past Medical History:  ?Diagnosis Date  ? Hypertension   ? ?Past Surgical History:  ?Procedure Laterality Date  ? IR THORACENTESIS ASP PLEURAL SPACE W/IMG GUIDE  04/05/2021  ? THORACENTESIS N/A 04/12/2021  ? Procedure: THORACENTESIS;  Surgeon: [name], DO;  Location: [place];  Service: Pulmonary;  Laterality: N/A;  ? ?Patient Active Problem List  ? Diagnosis Date Noted  ? Renal cell carcinoma, left ([place]) 05/09/2021  ? Encounter for antineoplastic immunotherapy 05/09/2021  ? S/P thoracentesis   ? Pleural effusion on left 03/10/2021  ? ? ?REFERRING DIAG: M19.90 (ICD-10-CM) - Unspecified osteoarthritis, unspecified site ? ?THERAPY DIAG:  ?Impaired mobility and endurance ? ?Muscle weakness (generalized) ? ?Rotator cuff syndrome of right shoulder ? ?Chronic right shoulder pain ? ?Lateral pain of left hip ? ?PERTINENT HISTORY:  ? Hypertension  ?Renal cell carcinoma, left (HCC) ? ?PRECAUTIONS: Active cancer ? ?SUBJECTIVE: Patient states she is doing ok today.  She reports normal BP today.  She denies any pain other than a headache.    ? ?PAIN:  ?Are you having pain? Yes ?NPRS scale: 0/10 ?Pain location: Shoulder ?Pain orientation: Right  ?PAIN TYPE: aching ?Pain description: intermittant  ?Aggravating factors: reaching, using arm ?Relieving factors: rest, meds ? ? ? ? ?OBJECTIVE ?   ?COGNITION: ?           Overall cognitive status: Within functional limits for tasks assessed  ?  ?OBSERVATIONS / OTHER ASSESSMENTS: frail older women.  Walks slowly but no LOB. Holds onto arm of daughter in law ?  ?POSTURE: increased thoracic kyphosis ?  ?  ?LYMPHEDEMA ASSESSMENTS:  ?  ?CHEMOTHERAPY: currently immunotherapy 1x per month making pt feel weak after infusions and 1 daily pill  ?  ?  ?Test Score Norms  ?TUG 62ft 22.15 seconds High Fall risk >/= 14 sec ?Any fall risk: 12+ sec ?Risk of developing disability: 9+  ?Gait Speed (distance (m)/time) 0.13 m/s <0.39m/s household only ?0.4-0.8 limited community ?0.8-1.2 community ?1.2 able to cross streets ?  ?Needs intervention for fall risk: <90m/s  ?5 time sit to stand (16?) 34.6 sec - just felt muscles working >13.6 sec= increased disability ?>15sec predicts recurrent falls ?   ?30 second sit to stand (17?)   ?  ?  <8 lower functional ability ?See below: minimum to maintain independent mobility   ?Grip Strength   ?Rt:        20                              Lt:15 ?  Normal around 40lbs  ?6 min walk ?  ?Not if: resting HR>120, >180/119mmHg  ?  ?Cardiac risk factors: peak HR<120-130BPM or rise of <  20BPM ?  ?Beta blocker: HR not reliable use RPE and BP ?  Baseline HR: ?  ?         BP: 143/85 ?  ?         O2: ?  ?Distance: ?  ?Post HR: ?  ?         BP: ?  ?         RPE: ?  ?         SOB:  ?  1312-2342ft normal ?158ft MCID ?<1042ft 2x more likely to fall   ?Berg   ?33/56 ?  <45 high risk of falls ?<40 almost 100% fall risk  ?  ?  ?  ?  ?  ?GAIT: ?Distance walked: 0 feet  ?Assistive device utilized: None ?Level of assistance: SBA ?Comments: na  ?  ?PATIENT EDUCATION:  ?Education details: none today ?Person educated: Patient and Child(ren) ?Education method: Explanation ?Education comprehension: verbalized understanding  with use of interpreter  ?   ? Today's Treatment:  11-14-21 ?NuStep x 5 min level 1 ?Seated: ?Seated rows with red tband  2 x 10 ?Standing shoulder  extension with red tband 2 x 10 at table in case of balance issues ?Shoulder rolls x 10 ?Hamstring curls x 10 each (red tband) ?LAQ 2 x 10 both 2 lb ankle weight ?March x 20 with 2 lb ankle weight  (alternating) ?Sit to stand x 10 with 5 lb kb ?Clamshell with red tband 2 x 10 ?Bilateral shoulder flexion x 10 with 1 lb dumbells ?Bilateral bicep curls 2 x 10 with 1 lb dumbells ?Tricep press with red tband 2 x 10 each UE ?Seated diagonals (hip to shoulder with 5 lb kb) x 10 each side ?Standing: ?Step up and down on/off balance pad x 10 ?Marching on balance pad x 20 ?Mini squats on balance pad x 20   ? ?Today's Treatment:  11-09-21 ?NuStep x 5 min level 1 ?Seated: ?Seated rows with yellow tband  2 x 10 ?Shoulder rolls x 10 ?Hamstring curls x 10 each (red tband) ?LAQ 2 x 10 both 2 lb ankle weight ?March x 20 with 2 lb ankle weight  (alternating) ?Sit to stand x 10 with 5 lb kb ?Clamshell with yellow tband 2 x 10 ?Bilateral shoulder flexion x 10 with 1 lb dumbells ?Bilateral bicep curls 2 x 10 with 1 lb dumbells ?Tricep press with yellow tband 2 x 10 each UE ?Seated diagonals (hip to shoulder with 5 lb kb) x 10 each side ?Standing: ?Step up and down on/off balance pad x 10 ?Marching on balance pad x 20 ?Mini squats on balance pad x 20 ? ?Today's Treatment:  11-02-21 ?NuStep x 5 min level 1 ?Seated: ?Seated rows with yellow tband (very minimal resistance) 2 x 10 ?D2 PNF x 10 each arm (no weight) ?Shoulder rolls x 10 ?Supine: ?Bilateral shoulder flexion x 10 ?Red plyo ball shoulder flexion x 10 ?Red plyo ball chest press x 10 ?Hands clasped thoracic rotation ?Sidelying: ?Open book stretch x 10 each side ? ?  ?  ?  ?ASSESSMENT: ?  ?CLINICAL IMPRESSION: ?Patient is progressing appropriately.  She is very motivated and compliant.  She should continue to improve.  She does not seem to need as much assistance with ambulation when entering and exiting the clinic.  She would benefit from continued skilled PT for strength and balance  training to reduce fall risk and improve overall function.     ?  ?OBJECTIVE IMPAIRMENTS  Abnormal gait, decreased activity tolerance, decreased mobility, and difficulty walking.  ?  ?ACTIVITY LIMITATIONS cleaning and meal prep.  ?  ?PERSONAL FACTORS Age, Fitness, and 1 comorbidity: palliative metastatic cancer  are also affecting patient's functional outcome.  ?  ?  ?REHAB POTENTIAL: Good ?  ?CLINICAL DECISION MAKING: Stable/uncomplicated ?  ?EVALUATION COMPLEXITY: Low ?  ?  ?GOALS: ?Goals reviewed with patient? Yes ?  ?  ?LONG TERM GOALS:  ?  ?LTG Name Target Date Goal status  ?1 Pt will improve berg balance score to at least 40/56 ?Baseline:33 12/26/2021 INITIAL  ?2 Pt will improve 5x sit to stand to <30 seconds to demonstrate decreased fall risk ?Baseline:34 seconds 12/26/2021 INITIAL  ?3 Pt will complete 80min walk test in clinic to demonstrate improved endurance ?Baseline: walks 7ft around gym 12/26/2021 INITIAL  ?4 Pt will be ind with final HEP ?Baseline: 12/26/2021 INITIAL  ?         ?         ?         ?  ?PLAN: ?PT FREQUENCY: 2x/week ?  ?PT DURATION: 8 weeks ?  ?PLANNED INTERVENTIONS: Therapeutic exercises, Therapeutic activity, Neuro Muscular re-education, Balance training, Gait training, Patient/Family education, and Joint mobilization ?  ?PLAN FOR NEXT SESSION: Continue general TE to include UE/LE, progress low level balance and activity tolerance ? ? ? ?Anderson Malta B. Selin Eisler, PT ?11/15/2310:15 PM  ? ?   ?

## 2021-11-16 ENCOUNTER — Ambulatory Visit: Payer: Medicaid Other

## 2021-11-16 ENCOUNTER — Other Ambulatory Visit: Payer: Self-pay

## 2021-11-16 DIAGNOSIS — M25511 Pain in right shoulder: Secondary | ICD-10-CM

## 2021-11-16 DIAGNOSIS — M75101 Unspecified rotator cuff tear or rupture of right shoulder, not specified as traumatic: Secondary | ICD-10-CM

## 2021-11-16 DIAGNOSIS — M6281 Muscle weakness (generalized): Secondary | ICD-10-CM | POA: Diagnosis not present

## 2021-11-16 DIAGNOSIS — Z7409 Other reduced mobility: Secondary | ICD-10-CM

## 2021-11-16 DIAGNOSIS — M25552 Pain in left hip: Secondary | ICD-10-CM

## 2021-11-16 DIAGNOSIS — G8929 Other chronic pain: Secondary | ICD-10-CM

## 2021-11-16 NOTE — Therapy (Signed)
?OUTPATIENT PHYSICAL THERAPY TREATMENT NOTE ? ? ?Patient Name: Gail Fitzgerald ?MRN: [id number] ?DOB:05-22-1935, 87 y.o., female ?Today's Date: 11/16/2021 ? ?PCP: [name], MD ?REFERRING PROVIDER: Geralynn Rile* ? ? PT End of Session - 11/16/21 1025   ? ? Visit Number 6   ? Number of Visits 12   ? Date for PT Re-Evaluation 12/14/21   ? Authorization Type medicaid healthy blue - update auth when approved   ? Authorization Time Period 12 visits  11-01-21 through 12-14-21   ? PT Start Time 1018   ? PT Stop Time 1100   ? PT Time Calculation (min) 42 min   ? Activity Tolerance Patient tolerated treatment well   ? Behavior During Therapy [facility] for tasks assessed/performed   ? ?  ?  ? ?  ? ? ? ?Past Medical History:  ?Diagnosis Date  ? Hypertension   ? ?Past Surgical History:  ?Procedure Laterality Date  ? IR THORACENTESIS ASP PLEURAL SPACE W/IMG GUIDE  04/05/2021  ? THORACENTESIS N/A 04/12/2021  ? Procedure: THORACENTESIS;  Surgeon: [name], DO;  Location: [place] [facility];  Service: Pulmonary;  Laterality: N/A;  ? ?Patient Active Problem List  ? Diagnosis Date Noted  ? Renal cell carcinoma, left ([place]) 05/09/2021  ? Encounter for antineoplastic immunotherapy 05/09/2021  ? S/P thoracentesis   ? Pleural effusion on left 03/10/2021  ? ? ?REFERRING DIAG: M19.90 (ICD-10-CM) - Unspecified osteoarthritis, unspecified site ? ?THERAPY DIAG:  ?Impaired mobility and endurance ? ?Muscle weakness (generalized) ? ?Rotator cuff syndrome of right shoulder ? ?Chronic right shoulder pain ? ?Lateral pain of left hip ? ?PERTINENT HISTORY:  ? Hypertension  ?Renal cell carcinoma, left (HCC) ? ?PRECAUTIONS: Active cancer ? ?SUBJECTIVE: Patient states she is feeling weak today but is doing more at home with her walking regimen.  She feels the weather is making her feel poorly.      ? ?PAIN:  ?Are you having pain? Yes ?NPRS scale: 0/10 ?Pain location: Shoulder ?Pain orientation: Right  ?PAIN TYPE: aching ?Pain description:  intermittant  ?Aggravating factors: reaching, using arm ?Relieving factors: rest, meds ? ? ? ? ?OBJECTIVE ?  ?COGNITION: ?           Overall cognitive status: Within functional limits for tasks assessed  ?  ?OBSERVATIONS / OTHER ASSESSMENTS: frail older women.  Walks slowly but no LOB. Holds onto arm of daughter in law ?  ?POSTURE: increased thoracic kyphosis ?  ?  ?LYMPHEDEMA ASSESSMENTS:  ?  ?CHEMOTHERAPY: currently immunotherapy 1x per month making pt feel weak after infusions and 1 daily pill  ?  ?  ?Test Score Norms  ?TUG 57ft 22.15 seconds High Fall risk >/= 14 sec ?Any fall risk: 12+ sec ?Risk of developing disability: 9+  ?Gait Speed (distance (m)/time) 0.13 m/s <0.22m/s household only ?0.4-0.8 limited community ?0.8-1.2 community ?1.2 able to cross streets ?  ?Needs intervention for fall risk: <51m/s  ?5 time sit to stand (16?) 34.6 sec - just felt muscles working >13.6 sec= increased disability ?>15sec predicts recurrent falls ?   ?30 second sit to stand (17?)   ?  ?  <8 lower functional ability ?See below: minimum to maintain independent mobility   ?Grip Strength   ?Rt:        20                              Lt:15 ?  Normal around 40lbs  ?6 min  walk ?  ?Not if: resting HR>120, >180/157mmHg  ?  ?Cardiac risk factors: peak HR<120-130BPM or rise of <20BPM ?  ?Beta blocker: HR not reliable use RPE and BP ?  Baseline HR: ?  ?         BP: 143/85 ?  ?         O2: ?  ?Distance: ?  ?Post HR: ?  ?         BP: ?  ?         RPE: ?  ?         SOB:  ?  1312-2337ft normal ?153ft MCID ?<1058ft 2x more likely to fall   ?Berg   ?33/56 ?  <45 high risk of falls ?<40 almost 100% fall risk  ?  ?  ?  ?  ?  ?GAIT: ?Distance walked: 0 feet  ?Assistive device utilized: None ?Level of assistance: SBA ?Comments: na  ?  ?PATIENT EDUCATION:  ?Education details: none today ?Person educated: Patient and Child(ren) ?Education method: Explanation ?Education comprehension: verbalized understanding  with use of interpreter  ?   ? Today's  Treatment:  11-16-21 ?NuStep x 5 min level 1 ?Seated: ?Seated rows with red tband  2 x 10 ?Shoulder rolls x 10 ?Hamstring curls x 10 each (red tband) ?LAQ 2 x 10 both 2 lb ankle weight ?March x 20 with 2 lb ankle weight  (alternating) ?Sit to stand x 10 with 5 lb kb ?Clamshell with red tband 2 x 10 ?Bilateral shoulder flexion x 10 with 1 lb dumbells ?Single bicep curls 2 x 10 with red tband each arm ?Tricep press with red tband 2 x 10 each UE ?Seated diagonals (hip to shoulder with 5 lb kb) x 10 each side ?Standing: ?Step up and down on/off balance pad x 10 ?Marching on balance pad x 20 ?Mini squats on balance pad x 20   ?  ?Today's Treatment:  11-14-21 ?NuStep x 5 min level 1 ?Seated: ?Seated rows with red tband  2 x 10 ?Standing shoulder extension with red tband 2 x 10 at table in case of balance issues ?Shoulder rolls x 10 ?Hamstring curls x 10 each (red tband) ?LAQ 2 x 10 both 2 lb ankle weight ?March x 20 with 2 lb ankle weight  (alternating) ?Sit to stand x 10 with 5 lb kb ?Clamshell with red tband 2 x 10 ?Bilateral shoulder flexion x 10 with 1 lb dumbells ?Bilateral bicep curls 2 x 10 with 1 lb dumbells ?Tricep press with red tband 2 x 10 each UE ?Seated diagonals (hip to shoulder with 5 lb kb) x 10 each side ?Standing: ?Step up and down on/off balance pad x 10 ?Marching on balance pad x 20 ?Mini squats on balance pad x 20   ? ?Today's Treatment:  11-09-21 ?NuStep x 5 min level 1 ?Seated: ?Seated rows with yellow tband  2 x 10 ?Shoulder rolls x 10 ?Hamstring curls x 10 each (red tband) ?LAQ 2 x 10 both 2 lb ankle weight ?March x 20 with 2 lb ankle weight  (alternating) ?Sit to stand x 10 with 5 lb kb ?Clamshell with red tband 2 x 10 ?Bilateral shoulder flexion x 10 with 1 lb dumbells ?Bilateral bicep curls 2 x 10 with 1 lb dumbells ?Tricep press with yellow tband 2 x 10 each UE ?Seated diagonals (hip to shoulder with 5 lb kb) x 10 each side ?Standing: ?Step up and down on/off balance pad x 10 ?Marching on  balance  pad x 20 ?Mini squats on balance pad x 20 ? ? ? ?  ?  ?  ?ASSESSMENT: ?  ?CLINICAL IMPRESSION: ?Patient was feeling poorly upon arrival but was able to complete all tasks.  She ambulates with less assist and with increased speed and confidence today.  She would benefit from continued skilled PT for strength and balance training to reduce fall risk and improve overall function.     ?  ?OBJECTIVE IMPAIRMENTS Abnormal gait, decreased activity tolerance, decreased mobility, and difficulty walking.  ?  ?ACTIVITY LIMITATIONS cleaning and meal prep.  ?  ?PERSONAL FACTORS Age, Fitness, and 1 comorbidity: palliative metastatic cancer  are also affecting patient's functional outcome.  ?  ?  ?REHAB POTENTIAL: Good ?  ?CLINICAL DECISION MAKING: Stable/uncomplicated ?  ?EVALUATION COMPLEXITY: Low ?  ?  ?GOALS: ?Goals reviewed with patient? Yes ?  ?  ?LONG TERM GOALS:  ?  ?LTG Name Target Date Goal status  ?1 Pt will improve berg balance score to at least 40/56 ?Baseline:33 12/26/2021 INITIAL  ?2 Pt will improve 5x sit to stand to <30 seconds to demonstrate decreased fall risk ?Baseline:34 seconds 12/26/2021 INITIAL  ?3 Pt will complete 27min walk test in clinic to demonstrate improved endurance ?Baseline: walks 15ft around gym 12/26/2021 INITIAL  ?4 Pt will be ind with final HEP ?Baseline: 12/26/2021 INITIAL  ?         ?         ?         ?  ?PLAN: ?PT FREQUENCY: 2x/week ?  ?PT DURATION: 8 weeks ?  ?PLANNED INTERVENTIONS: Therapeutic exercises, Therapeutic activity, Neuro Muscular re-education, Balance training, Gait training, Patient/Family education, and Joint mobilization ?  ?PLAN FOR NEXT SESSION: Continue general TE to include UE/LE, progress low level balance and activity tolerance ? ? ? ?Anderson Malta B. Kaleem Sartwell, PT ?11/16/2309:11 AM  ? ?   ?

## 2021-11-19 ENCOUNTER — Other Ambulatory Visit: Payer: Self-pay

## 2021-11-21 ENCOUNTER — Other Ambulatory Visit: Payer: Self-pay | Admitting: Nurse Practitioner

## 2021-11-21 ENCOUNTER — Other Ambulatory Visit (HOSPITAL_COMMUNITY): Payer: Self-pay

## 2021-11-21 ENCOUNTER — Other Ambulatory Visit: Payer: Self-pay

## 2021-11-21 ENCOUNTER — Ambulatory Visit: Payer: Medicaid Other

## 2021-11-21 DIAGNOSIS — M25552 Pain in left hip: Secondary | ICD-10-CM

## 2021-11-21 DIAGNOSIS — M6281 Muscle weakness (generalized): Secondary | ICD-10-CM

## 2021-11-21 DIAGNOSIS — Z7409 Other reduced mobility: Secondary | ICD-10-CM

## 2021-11-21 DIAGNOSIS — M75101 Unspecified rotator cuff tear or rupture of right shoulder, not specified as traumatic: Secondary | ICD-10-CM

## 2021-11-21 DIAGNOSIS — G8929 Other chronic pain: Secondary | ICD-10-CM

## 2021-11-21 MED ORDER — CELECOXIB 100 MG PO CAPS
100.0000 mg | ORAL_CAPSULE | Freq: Two times a day (BID) | ORAL | 1 refills | Status: DC
  Filled 2021-11-21: qty 60, 30d supply, fill #0
  Filled 2021-12-09: qty 60, 30d supply, fill #1

## 2021-11-21 NOTE — Therapy (Signed)
?OUTPATIENT PHYSICAL THERAPY TREATMENT NOTE ? ? ?Patient Name: Gail Fitzgerald ?MRN: [id number] ?DOB:29-Apr-1935, 87 y.o., female ?Today's Date: 11/21/2021 ? ?PCP: [name], MD ?REFERRING PROVIDER: Geralynn Rile* ? ? PT End of Session - 11/21/21 1152   ? ? Visit Number 7   ? Number of Visits 12   ? Date for PT Re-Evaluation 12/14/21   ? Authorization Type medicaid healthy blue - update auth when approved   ? Authorization Time Period 12 visits  11-01-21 through 12-14-21   ? PT Start Time 1150   ? PT Stop Time 1230   ? PT Time Calculation (min) 40 min   ? Activity Tolerance Patient tolerated treatment well   ? Behavior During Therapy [facility] for tasks assessed/performed   ? ?  ?  ? ?  ? ? ? ?Past Medical History:  ?Diagnosis Date  ? Hypertension   ? ?Past Surgical History:  ?Procedure Laterality Date  ? IR THORACENTESIS ASP PLEURAL SPACE W/IMG GUIDE  04/05/2021  ? THORACENTESIS N/A 04/12/2021  ? Procedure: THORACENTESIS;  Surgeon: [name], DO;  Location: [place] [facility];  Service: Pulmonary;  Laterality: N/A;  ? ?Patient Active Problem List  ? Diagnosis Date Noted  ? Renal cell carcinoma, left ([place]) 05/09/2021  ? Encounter for antineoplastic immunotherapy 05/09/2021  ? S/P thoracentesis   ? Pleural effusion on left 03/10/2021  ? ? ?REFERRING DIAG: M19.90 (ICD-10-CM) - Unspecified osteoarthritis, unspecified site ? ?THERAPY DIAG:  ?Impaired mobility and endurance ? ?Muscle weakness (generalized) ? ?Rotator cuff syndrome of right shoulder ? ?Chronic right shoulder pain ? ?Lateral pain of left hip ? ?PERTINENT HISTORY:  ? Hypertension  ?Renal cell carcinoma, left (HCC) ? ?PRECAUTIONS: Active cancer ? ?SUBJECTIVE: Patient states she is feeling ok today.        ? ?PAIN:  ?Are you having pain? No ?NPRS scale: 0/10 ?Pain location: Shoulder ?Pain orientation: Right  ?PAIN TYPE: aching ?Pain description: intermittant  ?Aggravating factors: reaching, using arm ?Relieving factors: rest, meds ? ? ? ? ?OBJECTIVE ?   ?COGNITION: ?           Overall cognitive status: Within functional limits for tasks assessed  ?  ?OBSERVATIONS / OTHER ASSESSMENTS: frail older women.  Walks slowly but no LOB. Holds onto arm of daughter in law ?  ?POSTURE: increased thoracic kyphosis ?  ?  ?LYMPHEDEMA ASSESSMENTS:  ?  ?CHEMOTHERAPY: currently immunotherapy 1x per month making pt feel weak after infusions and 1 daily pill  ?  ?  ?Test Score Norms  ?TUG 54ft 22.15 seconds High Fall risk >/= 14 sec ?Any fall risk: 12+ sec ?Risk of developing disability: 9+  ?Gait Speed (distance (m)/time) 0.13 m/s <0.61m/s household only ?0.4-0.8 limited community ?0.8-1.2 community ?1.2 able to cross streets ?  ?Needs intervention for fall risk: <70m/s  ?5 time sit to stand (16?) 34.6 sec - just felt muscles working >13.6 sec= increased disability ?>15sec predicts recurrent falls ?   ?30 second sit to stand (17?)   ?  ?  <8 lower functional ability ?See below: minimum to maintain independent mobility   ?Grip Strength   ?Rt:        20                              Lt:15 ?  Normal around 40lbs  ?6 min walk ?  ?Not if: resting HR>120, >180/140mmHg  ?  ?Cardiac risk factors: peak HR<120-130BPM or rise  of <20BPM ?  ?Beta blocker: HR not reliable use RPE and BP ?  Baseline HR: ?  ?         BP: 143/85 ?  ?         O2: ?  ?Distance: ?  ?Post HR: ?  ?         BP: ?  ?         RPE: ?  ?         SOB:  ?  1312-2367ft normal ?138ft MCID ?<1061ft 2x more likely to fall   ?Berg   ?33/56 ?  <45 high risk of falls ?<40 almost 100% fall risk  ?  ?  ?  ?  ?  ?GAIT: ?Distance walked: 0 feet  ?Assistive device utilized: None ?Level of assistance: SBA ?Comments: na  ?  ?PATIENT EDUCATION:  ?Education details: none today ?Person educated: Patient and Child(ren) ?Education method: Explanation ?Education comprehension: verbalized understanding  with use of interpreter  ?   ? Today's Treatment:  11-21-21 ?NuStep x 5 min level 4 ?Seated: ?Seated rows with red tband  2 x 10 ?Shoulder rolls x  10 ?Hamstring curls x 10 each (red tband) ?LAQ 2 x 10 both 2 lb ankle weight ?March x 20 with 2 lb ankle weight  (alternating) ?Sit to stand x 10 with 5 lb kb ?Clamshell with green loop 2 x 10 ?Bilateral shoulder flexion 2 x 10 with 1 lb dumbells ?Single bicep curls 2 x 10 with 1 lb dumbells ?Tricep press with red tband 2 x 10 each UE ?Seated diagonals (hip to shoulder with 5 lb kb) x 10 each side ?Standing: ?Alternating cone taps in standing with min assist.  ?Step up and down on/off balance pad x 10 (up and over today) with min assist. ?Marching on balance pad x 20 with min assist ?Mini squats on balance pad x 20  with min assist ?  ?Today's Treatment:  11-16-21 ?NuStep x 5 min level 1 ?Seated: ?Seated rows with red tband  2 x 10 ?Shoulder rolls x 10 ?Hamstring curls x 10 each (red tband) ?LAQ 2 x 10 both 2 lb ankle weight ?March x 20 with 2 lb ankle weight  (alternating) ?Sit to stand x 10 with 5 lb kb ?Clamshell with red tband 2 x 10 ?Bilateral shoulder flexion x 10 with 1 lb dumbells ?Single bicep curls 2 x 10 with red tband each arm ?Tricep press with red tband 2 x 10 each UE ?Seated diagonals (hip to shoulder with 5 lb kb) x 10 each side ?Standing: ?Step up and down on/off balance pad x 10 ?Marching on balance pad x 20 ?Mini squats on balance pad x 20   ?  ?Today's Treatment:  11-14-21 ?NuStep x 5 min level 1 ?Seated: ?Seated rows with red tband  2 x 10 ?Standing shoulder extension with red tband 2 x 10 at table in case of balance issues ?Shoulder rolls x 10 ?Hamstring curls x 10 each (red tband) ?LAQ 2 x 10 both 2 lb ankle weight ?March x 20 with 2 lb ankle weight  (alternating) ?Sit to stand x 10 with 5 lb kb ?Clamshell with red tband 2 x 10 ?Bilateral shoulder flexion x 10 with 1 lb dumbells ?Bilateral bicep curls 2 x 10 with 1 lb dumbells ?Tricep press with red tband 2 x 10 each UE ?Seated diagonals (hip to shoulder with 5 lb kb) x 10 each side ?Standing: ?Step up and down on/off balance  pad x 10 ?Marching  on balance pad x 20 ?Mini squats on balance pad x 20   ?  ?ASSESSMENT: ?  ?CLINICAL IMPRESSION: ?Patient was able to complete all tasks and was able to do level 4 on NuStep.  She admits her BP was low this morning.   She would benefit from continued skilled PT for strength and balance training to reduce fall risk and improve overall function.     ?  ?OBJECTIVE IMPAIRMENTS Abnormal gait, decreased activity tolerance, decreased mobility, and difficulty walking.  ?  ?ACTIVITY LIMITATIONS cleaning and meal prep.  ?  ?PERSONAL FACTORS Age, Fitness, and 1 comorbidity: palliative metastatic cancer  are also affecting patient's functional outcome.  ?  ?  ?REHAB POTENTIAL: Good ?  ?CLINICAL DECISION MAKING: Stable/uncomplicated ?  ?EVALUATION COMPLEXITY: Low ?  ?  ?GOALS: ?Goals reviewed with patient? Yes ?  ?  ?LONG TERM GOALS:  ?  ?LTG Name Target Date Goal status  ?1 Pt will improve berg balance score to at least 40/56 ?Baseline:33 12/26/2021 INITIAL  ?2 Pt will improve 5x sit to stand to <30 seconds to demonstrate decreased fall risk ?Baseline:34 seconds 12/26/2021 INITIAL  ?3 Pt will complete 51min walk test in clinic to demonstrate improved endurance ?Baseline: walks 3ft around gym 12/26/2021 INITIAL  ?4 Pt will be ind with final HEP ?Baseline: 12/26/2021 INITIAL  ?         ?         ?         ?  ?PLAN: ?PT FREQUENCY: 2x/week ?  ?PT DURATION: 8 weeks ?  ?PLANNED INTERVENTIONS: Therapeutic exercises, Therapeutic activity, Neuro Muscular re-education, Balance training, Gait training, Patient/Family education, and Joint mobilization ?  ?PLAN FOR NEXT SESSION: Continue general TE to include UE/LE, progress low level balance and activity tolerance. ? ? ? ?Anderson Malta B. Duquan Gillooly, PT ?03/15/232:47 PM  ? ?   ?

## 2021-11-23 ENCOUNTER — Other Ambulatory Visit (HOSPITAL_COMMUNITY): Payer: Self-pay

## 2021-11-23 ENCOUNTER — Other Ambulatory Visit: Payer: Self-pay

## 2021-11-23 ENCOUNTER — Ambulatory Visit: Payer: Medicaid Other

## 2021-11-23 DIAGNOSIS — M75101 Unspecified rotator cuff tear or rupture of right shoulder, not specified as traumatic: Secondary | ICD-10-CM

## 2021-11-23 DIAGNOSIS — M25552 Pain in left hip: Secondary | ICD-10-CM

## 2021-11-23 DIAGNOSIS — Z7409 Other reduced mobility: Secondary | ICD-10-CM

## 2021-11-23 DIAGNOSIS — G8929 Other chronic pain: Secondary | ICD-10-CM

## 2021-11-23 DIAGNOSIS — M6281 Muscle weakness (generalized): Secondary | ICD-10-CM | POA: Diagnosis not present

## 2021-11-23 NOTE — Therapy (Signed)
?OUTPATIENT PHYSICAL THERAPY TREATMENT NOTE ? ? ?Patient Name: Gail Fitzgerald ?MRN: [id number] ?DOB:[birth year], 87 y.o., female ?Today's Date: 11/23/2021 ? ?PCP: [name], MD ?REFERRING PROVIDER: Geralynn Rile* ? ? PT End of Session - 11/23/21 1023   ? ? Visit Number 8   ? Number of Visits 12   ? Date for PT Re-Evaluation 12/14/21   ? Authorization Type medicaid healthy blue - update auth when approved   ? Authorization Time Period 12 visits  11-01-21 through 12-14-21   ? PT Start Time 1017   ? PT Stop Time 1100   ? PT Time Calculation (min) 43 min   ? Activity Tolerance Patient tolerated treatment well   ? Behavior During Therapy [facility] for tasks assessed/performed   ? ?  ?  ? ?  ? ? ? ?Past Medical History:  ?Diagnosis Date  ? Hypertension   ? ?Past Surgical History:  ?Procedure Laterality Date  ? IR THORACENTESIS ASP PLEURAL SPACE W/IMG GUIDE  04/05/2021  ? THORACENTESIS N/A 04/12/2021  ? Procedure: THORACENTESIS;  Surgeon: [name], DO;  Location: [facility];  Service: Pulmonary;  Laterality: N/A;  ? ?Patient Active Problem List  ? Diagnosis Date Noted  ? Renal cell carcinoma, left ([place]) 05/09/2021  ? Encounter for antineoplastic immunotherapy 05/09/2021  ? S/P thoracentesis   ? Pleural effusion on left 03/10/2021  ? ? ?REFERRING DIAG: M19.90 (ICD-10-CM) - Unspecified osteoarthritis, unspecified site ? ?THERAPY DIAG:  ?Impaired mobility and endurance ? ?Muscle weakness (generalized) ? ?Rotator cuff syndrome of right shoulder ? ?Chronic right shoulder pain ? ?Lateral pain of left hip ? ?PERTINENT HISTORY:  ? Hypertension  ?Renal cell carcinoma, left ([facility]) ? ?PRECAUTIONS: Active cancer ? ?SUBJECTIVE: Patient states she is feeling fine.  Has been able to eat better.  Eating oatmeal.        ? ?PAIN:  ?Are you having pain? No ?NPRS scale: 0/10 ?Pain location: Shoulder ?Pain orientation: Right  ?PAIN TYPE: aching ?Pain description: intermittant  ?Aggravating factors: reaching, using arm ?Relieving  factors: rest, meds ? ? ? ? ?OBJECTIVE ?  ?COGNITION: ?           Overall cognitive status: Within functional limits for tasks assessed  ?  ?OBSERVATIONS / OTHER ASSESSMENTS: frail older women.  Walks slowly but no LOB. Holds onto arm of daughter in law ?  ?POSTURE: increased thoracic kyphosis ?  ?  ?LYMPHEDEMA ASSESSMENTS:  ?  ?CHEMOTHERAPY: currently immunotherapy 1x per month making pt feel weak after infusions and 1 daily pill  ?  ?  ?Test Score Norms  ?TUG 5ft 22.15 seconds High Fall risk >/= 14 sec ?Any fall risk: 12+ sec ?Risk of developing disability: 9+  ?Gait Speed (distance (m)/time) 0.13 m/s <0.4m/s household only ?0.4-0.8 limited community ?0.8-1.2 community ?1.2 able to cross streets ?  ?Needs intervention for fall risk: <1m/s  ?5 time sit to stand (16?) 34.6 sec - just felt muscles working >13.6 sec= increased disability ?>15sec predicts recurrent falls ?   ?30 second sit to stand (17?)   ?  ?  <8 lower functional ability ?See below: minimum to maintain independent mobility   ?Grip Strength   ?Rt:        20                              Lt:15 ?  Normal around 40lbs  ?6 min walk ?  ?Not if: resting HR>120, >180/122mmHg  ?  ?  Cardiac risk factors: peak HR<120-130BPM or rise of <20BPM ?  ?Beta blocker: HR not reliable use RPE and BP ?  Baseline HR: ?  ?         BP: 143/85 ?  ?         O2: ?  ?Distance: ?  ?Post HR: ?  ?         BP: ?  ?         RPE: ?  ?         SOB:  ?  1312-2364ft normal ?137ft MCID ?<1075ft 2x more likely to fall   ?Berg   ?33/56 ?  <45 high risk of falls ?<40 almost 100% fall risk  ?  ?  ?  ?  ?  ?GAIT: ?Distance walked: 0 feet  ?Assistive device utilized: None ?Level of assistance: SBA ?Comments: na  ?  ?PATIENT EDUCATION:  ?Education details: none today ?Person educated: Patient and Child(ren) ?Education method: Explanation ?Education comprehension: verbalized understanding  with use of interpreter  ?   ? Today's Treatment:  11-21-21 ?NuStep x 5 min level 4 ?Seated: ?Seated rows with  red tband  2 x 10 ?Tricep press with red tband 2 x 10 each UE ?Seated bilateral shoulder ER with red band 2 x 10 ?Shoulder rolls x 10 ?Hamstring curls x 10 each (red tband) ?LAQ 2 x 10 both 2.5 lb ankle weight ?March x 20 with 2.5 lb ankle weight  (alternating) ?Sit to stand x 10 with 5 lb kb ?Clamshell with green loop 2 x 10 ?Bilateral shoulder flexion 2 x 10 with 1 lb dumbells ?Single bicep curls 2 x 10 with 1 lb dumbells ?Seated diagonals (hip to shoulder with 5 lb kb) x 10 each side ?Standing: (On all balance activities: Held patient at waist today vs. Hand hold to limit assist) ?Alternating cone taps in standing with min assist.  ?Step up and down on/off balance pad x 10 (up and over today) with min assist. ?Marching on balance pad x 20 with min assist ?Mini squats on balance pad x 20  with min assist ?Ball toss with PT standing next to treatment table (stand by assist) x 20 tosses challenging reach outside of base of support ?  ?Today's Treatment:  11-16-21 ?NuStep x 5 min level 1 ?Seated: ?Seated rows with red tband  2 x 10 ?Shoulder rolls x 10 ?Hamstring curls x 10 each (red tband) ?LAQ 2 x 10 both 2 lb ankle weight ?March x 20 with 2 lb ankle weight  (alternating) ?Sit to stand x 10 with 5 lb kb ?Clamshell with red tband 2 x 10 ?Bilateral shoulder flexion x 10 with 1 lb dumbells ?Single bicep curls 2 x 10 with red tband each arm ?Tricep press with red tband 2 x 10 each UE ?Seated diagonals (hip to shoulder with 5 lb kb) x 10 each side ?Standing: ?Step up and down on/off balance pad x 10 ?Marching on balance pad x 20 ?Mini squats on balance pad x 20   ?  ?Today's Treatment:  11-14-21 ?NuStep x 5 min level 1 ?Seated: ?Seated rows with red tband  2 x 10 ?Standing shoulder extension with red tband 2 x 10 at table in case of balance issues ?Shoulder rolls x 10 ?Hamstring curls x 10 each (red tband) ?LAQ 2 x 10 both 2 lb ankle weight ?March x 20 with 2 lb ankle weight  (alternating) ?Sit to stand x 10 with 5 lb  kb ?Clamshell  with red tband 2 x 10 ?Bilateral shoulder flexion x 10 with 1 lb dumbells ?Bilateral bicep curls 2 x 10 with 1 lb dumbells ?Tricep press with red tband 2 x 10 each UE ?Seated diagonals (hip to shoulder with 5 lb kb) x 10 each side ?Standing: ?Step up and down on/off balance pad x 10 ?Marching on balance pad x 20 ?Mini squats on balance pad x 20   ?  ?ASSESSMENT: ?  ?CLINICAL IMPRESSION: ?Patient was able to complete all tasks with min fatigue today.  Decreased resistance on NuStep to 2 due to shoulders aching per patient on arrival.  She was able to do all balance tasks today with contact guard assist vs. Min assist with only 2 minor losses of balance with good protective response.  She is eating better as well which helps with her overall strength.  She would benefit from continued skilled PT for strength and balance training to reduce fall risk and improve overall function.     ?  ?OBJECTIVE IMPAIRMENTS Abnormal gait, decreased activity tolerance, decreased mobility, and difficulty walking.  ?  ?ACTIVITY LIMITATIONS cleaning and meal prep.  ?  ?PERSONAL FACTORS Age, Fitness, and 1 comorbidity: palliative metastatic cancer  are also affecting patient's functional outcome.  ?  ?  ?REHAB POTENTIAL: Good ?  ?CLINICAL DECISION MAKING: Stable/uncomplicated ?  ?EVALUATION COMPLEXITY: Low ?  ?  ?GOALS: ?Goals reviewed with patient? Yes ?  ?  ?LONG TERM GOALS:  ?  ?LTG Name Target Date Goal status  ?1 Pt will improve berg balance score to at least 40/56 ?Baseline:33 12/26/2021 INITIAL  ?2 Pt will improve 5x sit to stand to <30 seconds to demonstrate decreased fall risk ?Baseline:34 seconds 12/26/2021 INITIAL  ?3 Pt will complete 17min walk test in clinic to demonstrate improved endurance ?Baseline: walks 93ft around gym 12/26/2021 INITIAL  ?4 Pt will be ind with final HEP ?Baseline: 12/26/2021 INITIAL  ?         ?         ?         ?  ?PLAN: ?PT FREQUENCY: 2x/week ?  ?PT DURATION: 8 weeks ?  ?PLANNED  INTERVENTIONS: Therapeutic exercises, Therapeutic activity, Neuro Muscular re-education, Balance training, Gait training, Patient/Family education, and Joint mobilization ?  ?PLAN FOR NEXT SESSION: Continue general TE

## 2021-11-24 ENCOUNTER — Other Ambulatory Visit: Payer: Self-pay | Admitting: Internal Medicine

## 2021-11-28 ENCOUNTER — Other Ambulatory Visit: Payer: Self-pay

## 2021-11-28 ENCOUNTER — Ambulatory Visit: Payer: Medicaid Other

## 2021-11-28 DIAGNOSIS — M75101 Unspecified rotator cuff tear or rupture of right shoulder, not specified as traumatic: Secondary | ICD-10-CM

## 2021-11-28 DIAGNOSIS — Z7409 Other reduced mobility: Secondary | ICD-10-CM

## 2021-11-28 DIAGNOSIS — M6281 Muscle weakness (generalized): Secondary | ICD-10-CM

## 2021-11-28 DIAGNOSIS — G8929 Other chronic pain: Secondary | ICD-10-CM

## 2021-11-28 NOTE — Therapy (Signed)
?OUTPATIENT PHYSICAL THERAPY TREATMENT NOTE ? ? ?Patient Name: Gail Fitzgerald ?MRN: [id number] ?DOB:10/25/1934, 87 y.o., female ?Today's Date: 11/28/2021 ? ?PCP: [name], MD ?REFERRING PROVIDER: Geralynn Rile* ? ? PT End of Session - 11/28/21 1150   ? ? Visit Number 9   ? Number of Visits 12   ? Date for PT Re-Evaluation 12/14/21   ? Authorization Type medicaid healthy blue - update auth when approved   ? Authorization Time Period 12 visits  11-01-21 through 12-14-21   ? PT Start Time 1145   ? PT Stop Time 1230   ? PT Time Calculation (min) 45 min   ? Activity Tolerance Patient tolerated treatment well   ? Behavior During Therapy [facility] for tasks assessed/performed   ? ?  ?  ? ?  ? ? ? ?Past Medical History:  ?Diagnosis Date  ? Hypertension   ? ?Past Surgical History:  ?Procedure Laterality Date  ? IR THORACENTESIS ASP PLEURAL SPACE W/IMG GUIDE  04/05/2021  ? THORACENTESIS N/A 04/12/2021  ? Procedure: THORACENTESIS;  Surgeon: [name], DO;  Location: [facility];  Service: Pulmonary;  Laterality: N/A;  ? ?Patient Active Problem List  ? Diagnosis Date Noted  ? Renal cell carcinoma, left ([place]) 05/09/2021  ? Encounter for antineoplastic immunotherapy 05/09/2021  ? S/P thoracentesis   ? Pleural effusion on left 03/10/2021  ? ? ?REFERRING DIAG: M19.90 (ICD-10-CM) - Unspecified osteoarthritis, unspecified site ? ?THERAPY DIAG:  ?Impaired mobility and endurance ? ?Muscle weakness (generalized) ? ?Rotator cuff syndrome of right shoulder ? ?Chronic right shoulder pain ? ?PERTINENT HISTORY:  ? Hypertension  ?Renal cell carcinoma, left ([facility]) ? ?PRECAUTIONS: Active cancer ? ?SUBJECTIVE: Patient states she was feeling a little tired all day yesterday but feeling better today because she slept well last night .        ? ?PAIN:  ?Are you having pain? No ?NPRS scale: 0/10 ?Pain location: Shoulder ?Pain orientation: Right  ?PAIN TYPE: aching ?Pain description: intermittant  ?Aggravating factors: reaching, using  arm ?Relieving factors: rest, meds ? ? ? ? ?OBJECTIVE ?  ?COGNITION: ?           Overall cognitive status: Within functional limits for tasks assessed  ?  ?OBSERVATIONS / OTHER ASSESSMENTS: frail older women.  Walks slowly but no LOB. Holds onto arm of daughter in law ?  ?POSTURE: increased thoracic kyphosis ?  ?  ?LYMPHEDEMA ASSESSMENTS:  ?  ?CHEMOTHERAPY: currently immunotherapy 1x per month making pt feel weak after infusions and 1 daily pill  ?  ?  ?Test Score Norms  ?TUG 67ft 22.15 seconds High Fall risk >/= 14 sec ?Any fall risk: 12+ sec ?Risk of developing disability: 9+  ?Gait Speed (distance (m)/time) 0.13 m/s <0.42m/s household only ?0.4-0.8 limited community ?0.8-1.2 community ?1.2 able to cross streets ?  ?Needs intervention for fall risk: <73m/s  ?5 time sit to stand (16?) 34.6 sec - just felt muscles working >13.6 sec= increased disability ?>15sec predicts recurrent falls ?   ?30 second sit to stand (17?)   ?  ?  <8 lower functional ability ?See below: minimum to maintain independent mobility   ?Grip Strength   ?Rt:        20                              Lt:15 ?  Normal around 40lbs  ?6 min walk ?  ?Not if: resting HR>120, >180/142mmHg  ?  ?  Cardiac risk factors: peak HR<120-130BPM or rise of <20BPM ?  ?Beta blocker: HR not reliable use RPE and BP ?  Baseline HR: ?  ?         BP: 143/85 ?  ?         O2: ?  ?Distance: ?  ?Post HR: ?  ?         BP: ?  ?         RPE: ?  ?         SOB:  ?  1312-2344ft normal ?130ft MCID ?<1056ft 2x more likely to fall   ?Berg   ?33/56 ?  <45 high risk of falls ?<40 almost 100% fall risk  ?  ?  ?  ?  ?  ?GAIT: ?Distance walked: 0 feet  ?Assistive device utilized: None ?Level of assistance: SBA ?Comments: na  ?  ?PATIENT EDUCATION:  ?Education details: none today ?Person educated: Patient and Child(ren) ?Education method: Explanation ?Education comprehension: verbalized understanding  with use of interpreter  ?   ? Today's Treatment:  11-28-21 ?NuStep x 5 min level  4 ?Seated: ?Standing rows with yellow tband x 10 standing on balance pad (with cga) ?Standing shoulder extension with yellow tband x 10 on balance pad (with cga) ?Standing bilateral shoulder ER x 10 with yellow tband on balance pad (with cga) ?Standing shoulder horizontal abduction x 10 with yellow tband on balance pad (with cga) ?Standing on balance pad, ball toss against wall with cga ?Shoulder rolls x 10 ?Bilateral shoulder flexion 2 x 10 with 2 lb dumbells ?Single bicep curls 2 x 10 with 2 lb dumbells ?Alternating cone taps in standing with min assist. (3 D cones today x 3 each LE) ?Step up and down on/off balance pad x 10 (also up and over today) with min assist. X 10 ?Step over hurdles (step to, then step through) x 3 laps each ?Marching on balance pad x 20 with min assist ?Mini squats on balance pad x 20  with min assist ? ?  ? Today's Treatment:  11-21-21 ?NuStep x 5 min level 4 ?Seated: ?Seated rows with red tband  2 x 10 ?Tricep press with red tband 2 x 10 each UE ?Seated bilateral shoulder ER with red band 2 x 10 ?Shoulder rolls x 10 ?Hamstring curls x 10 each (red tband) ?LAQ 2 x 10 both 2.5 lb ankle weight ?March x 20 with 2.5 lb ankle weight  (alternating) ?Sit to stand x 10 with 5 lb kb ?Clamshell with green loop 2 x 10 ?Bilateral shoulder flexion 2 x 10 with 1 lb dumbells ?Single bicep curls 2 x 10 with 1 lb dumbells ?Seated diagonals (hip to shoulder with 5 lb kb) x 10 each side ?Standing: (On all balance activities: Held patient at waist today vs. Hand hold to limit assist) ?Alternating cone taps in standing with min assist.  ?Step up and down on/off balance pad x 10 (up and over today) with min assist. ?Marching on balance pad x 20 with min assist ?Mini squats on balance pad x 20  with min assist ?Ball toss with PT standing next to treatment table (stand by assist) x 20 tosses challenging reach outside of base of support ?  ?Today's Treatment:  11-16-21 ?NuStep x 5 min level 1 ?Seated: ?Seated rows  with red tband  2 x 10 ?Shoulder rolls x 10 ?Hamstring curls x 10 each (red tband) ?LAQ 2 x 10 both 2 lb ankle weight ?March x  20 with 2 lb ankle weight  (alternating) ?Sit to stand x 10 with 5 lb kb ?Clamshell with red tband 2 x 10 ?Bilateral shoulder flexion x 10 with 1 lb dumbells ?Single bicep curls 2 x 10 with red tband each arm ?Tricep press with red tband 2 x 10 each UE ?Seated diagonals (hip to shoulder with 5 lb kb) x 10 each side ?Standing: ?Step up and down on/off balance pad x 10 ?Marching on balance pad x 20 ?Mini squats on balance pad x 20   ?  ?  ?ASSESSMENT: ?  ?CLINICAL IMPRESSION: ?Patient was able to complete several tasks in standing without rest breaks between each activity.  She required only 5 seated rest breaks today vs. Normally needing seated rest break between each activity.   She was able to do higher level balance activities today with only cga.    She would benefit from continued skilled PT for strength and balance training to reduce fall risk and improve overall function.     ?  ?OBJECTIVE IMPAIRMENTS Abnormal gait, decreased activity tolerance, decreased mobility, and difficulty walking.  ?  ?ACTIVITY LIMITATIONS cleaning and meal prep.  ?  ?PERSONAL FACTORS Age, Fitness, and 1 comorbidity: palliative metastatic cancer  are also affecting patient's functional outcome.  ?  ?  ?REHAB POTENTIAL: Good ?  ?CLINICAL DECISION MAKING: Stable/uncomplicated ?  ?EVALUATION COMPLEXITY: Low ?  ?  ?GOALS: ?Goals reviewed with patient? Yes ?  ?  ?LONG TERM GOALS:  ?  ?LTG Name Target Date Goal status  ?1 Pt will improve berg balance score to at least 40/56 ?Baseline:33 12/26/2021 INITIAL  ?2 Pt will improve 5x sit to stand to <30 seconds to demonstrate decreased fall risk ?Baseline:34 seconds 12/26/2021 INITIAL  ?3 Pt will complete 71min walk test in clinic to demonstrate improved endurance ?Baseline: walks 31ft around gym 12/26/2021 INITIAL  ?4 Pt will be ind with final HEP ?Baseline: 12/26/2021 INITIAL   ?         ?         ?         ?  ?PLAN: ?PT FREQUENCY: 2x/week ?  ?PT DURATION: 8 weeks ?  ?PLANNED INTERVENTIONS: Therapeutic exercises, Therapeutic activity, Neuro Muscular re-education, Balance training, Gait tr

## 2021-11-30 ENCOUNTER — Ambulatory Visit: Payer: Medicaid Other

## 2021-11-30 ENCOUNTER — Other Ambulatory Visit (HOSPITAL_COMMUNITY): Payer: Self-pay

## 2021-11-30 ENCOUNTER — Other Ambulatory Visit: Payer: Self-pay

## 2021-11-30 DIAGNOSIS — G8929 Other chronic pain: Secondary | ICD-10-CM

## 2021-11-30 DIAGNOSIS — M6281 Muscle weakness (generalized): Secondary | ICD-10-CM

## 2021-11-30 DIAGNOSIS — M75101 Unspecified rotator cuff tear or rupture of right shoulder, not specified as traumatic: Secondary | ICD-10-CM

## 2021-11-30 DIAGNOSIS — M25552 Pain in left hip: Secondary | ICD-10-CM

## 2021-11-30 DIAGNOSIS — Z7409 Other reduced mobility: Secondary | ICD-10-CM

## 2021-11-30 NOTE — Therapy (Signed)
?OUTPATIENT PHYSICAL THERAPY TREATMENT NOTE ? ? ?Patient Name: Gail Fitzgerald ?MRN: [id number] ?DOB:[birth year], 87 y.o., female ?Today's Date: 11/30/2021 ? ?PCP: [name], MD ?REFERRING PROVIDER: Geralynn Rile* ? ? PT End of Session - 11/30/21 1022   ? ? Visit Number 10   ? Number of Visits 12   ? Date for PT Re-Evaluation 12/14/21   ? Authorization Type medicaid [employer] - update auth when approved   ? Authorization Time Period 12 visits  11-01-21 through 12-14-21   ? PT Start Time 1018   ? PT Stop Time 1058   ? PT Time Calculation (min) 40 min   ? Activity Tolerance Patient tolerated treatment well   ? Behavior During Therapy [facility] for tasks assessed/performed   ? ?  ?  ? ?  ? ? ? ?Past Medical History:  ?Diagnosis Date  ? Hypertension   ? ?Past Surgical History:  ?Procedure Laterality Date  ? IR THORACENTESIS ASP PLEURAL SPACE W/IMG GUIDE  04/05/2021  ? THORACENTESIS N/A 04/12/2021  ? Procedure: THORACENTESIS;  Surgeon: [name], DO;  Location: [facility];  Service: Pulmonary;  Laterality: N/A;  ? ?Patient Active Problem List  ? Diagnosis Date Noted  ? Renal cell carcinoma, left ([place]) 05/09/2021  ? Encounter for antineoplastic immunotherapy 05/09/2021  ? S/P thoracentesis   ? Pleural effusion on left 03/10/2021  ? ? ?REFERRING DIAG: M19.90 (ICD-10-CM) - Unspecified osteoarthritis, unspecified site ? ?THERAPY DIAG:  ?Impaired mobility and endurance ? ?Muscle weakness (generalized) ? ?Rotator cuff syndrome of right shoulder ? ?Chronic right shoulder pain ? ?Lateral pain of left hip ? ?PERTINENT HISTORY:  ? Hypertension  ?Renal cell carcinoma, left (HCC) ? ?PRECAUTIONS: Active cancer ? ?SUBJECTIVE: Patient states she is feeling ok today.   "I feel a little shaky today but I want to exercise"       ? ?PAIN:  ?Are you having pain? No ?NPRS scale: 0/10 ?Pain location: Shoulder ?Pain orientation: Right  ?PAIN TYPE: aching ?Pain description: intermittant  ?Aggravating factors: reaching, using  arm ?Relieving factors: rest, meds ? ? ? ? ?OBJECTIVE ?  ?COGNITION: ?           Overall cognitive status: Within functional limits for tasks assessed  ?  ?OBSERVATIONS / OTHER ASSESSMENTS: frail older women.  Walks slowly but no LOB. Holds onto arm of daughter in law ?  ?POSTURE: increased thoracic kyphosis ?  ?  ?LYMPHEDEMA ASSESSMENTS:  ?  ?CHEMOTHERAPY: currently immunotherapy 1x per month making pt feel weak after infusions and 1 daily pill  ?  ?  ?Test Score Norms  ?TUG 23ft 22.15 seconds High Fall risk >/= 14 sec ?Any fall risk: 12+ sec ?Risk of developing disability: 9+  ?Gait Speed (distance (m)/time) 0.13 m/s <0.13m/s household only ?0.4-0.8 limited community ?0.8-1.2 community ?1.2 able to cross streets ?  ?Needs intervention for fall risk: <81m/s  ?5 time sit to stand (16?) 34.6 sec - just felt muscles working >13.6 sec= increased disability ?>15sec predicts recurrent falls ?   ?30 second sit to stand (17?)   ?  ?  <8 lower functional ability ?See below: minimum to maintain independent mobility   ?Grip Strength   ?Rt:        20                              Lt:15 ?  Normal around 40lbs  ?6 min walk ?  ?Not if: resting  HR>120, >180/152mmHg  ?  ?Cardiac risk factors: peak HR<120-130BPM or rise of <20BPM ?  ?Beta blocker: HR not reliable use RPE and BP ?  Baseline HR: ?  ?         BP: 143/85 ?  ?         O2: ?  ?Distance: ?  ?Post HR: ?  ?         BP: ?  ?         RPE: ?  ?         SOB:  ?  1312-2364ft normal ?151ft MCID ?<1043ft 2x more likely to fall   ?Berg   ?33/56 ?  <45 high risk of falls ?<40 almost 100% fall risk  ?  ?  ?  ?  ?  ?GAIT: ?Distance walked: 0 feet  ?Assistive device utilized: None ?Level of assistance: SBA ?Comments: na  ?  ?PATIENT EDUCATION:  ?Education details: none today ?Person educated: Patient and Child(ren) ?Education method: Explanation ?Education comprehension: verbalized understanding  with use of interpreter  ?   ?Today's Treatment:  11-30-21 ?NuStep x 5 min level 4 ?Standing  rows with red tband x 10 standing on balance pad (with sba) ?Standing shoulder extension with red tband x 10 on balance pad (with sba) ?Standing bilateral shoulder ER x 10 with red tband on balance pad (with cga) ?Standing shoulder horizontal abduction x 10 with red tband on balance pad (with cga) ?Standing on balance pad, ball toss against wall with cga ?Shoulder rolls x 10 ?Seated HS curl (red) 2 x 10 each LE ?Seated knee extension 2 x 10 with 2.5 lb ?Seated hip flexion (march) x 20 with 2.5 lb ?Bilateral shoulder flexion 2 x 10 with 2 lb dumbells ?Single bicep curls 2 x 10 with 2 lb dumbells ?Alternating cone taps in standing with min assist. (3 D cones today x 3 each LE) ?Step over hurdles (step to, then step through) x 3 laps each with cga ?Standing on balance pad tossing ball against wall and catching (3 direction) 2 x 10 with cga ? ? Today's Treatment:  11-28-21 ?NuStep x 5 min level 4 ?Seated: ?Standing rows with yellow tband x 10 standing on balance pad (with cga) ?Standing shoulder extension with yellow tband x 10 on balance pad (with cga) ?Standing bilateral shoulder ER x 10 with yellow tband on balance pad (with cga) ?Standing shoulder horizontal abduction x 10 with yellow tband on balance pad (with cga) ?Standing on balance pad, ball toss against wall with cga ?Shoulder rolls x 10 ?Bilateral shoulder flexion 2 x 10 with 2 lb dumbells ?Single bicep curls 2 x 10 with 2 lb dumbells ?Alternating cone taps in standing with min assist. (3 D cones today x 3 each LE) ?Step up and down on/off balance pad x 10 (also up and over today) with min assist. X 10 ?Step over hurdles (step to, then step through) x 3 laps each ?Marching on balance pad x 20 with min assist ?Mini squats on balance pad x 20  with min assist ? ?  ? Today's Treatment:  11-21-21 ?NuStep x 5 min level 4 ?Seated: ?Seated rows with red tband  2 x 10 ?Tricep press with red tband 2 x 10 each UE ?Seated bilateral shoulder ER with red band 2 x  10 ?Shoulder rolls x 10 ?Hamstring curls x 10 each (red tband) ?LAQ 2 x 10 both 2.5 lb ankle weight ?March x 20 with 2.5 lb ankle weight  (  alternating) ?Sit to stand x 10 with 5 lb kb ?Clamshell with green loop 2 x 10 ?Bilateral shoulder flexion 2 x 10 with 1 lb dumbells ?Single bicep curls 2 x 10 with 1 lb dumbells ?Seated diagonals (hip to shoulder with 5 lb kb) x 10 each side ?Standing: (On all balance activities: Held patient at waist today vs. Hand hold to limit assist) ?Alternating cone taps in standing with min assist.  ?Step up and down on/off balance pad x 10 (up and over today) with min assist. ?Marching on balance pad x 20 with min assist ?Mini squats on balance pad x 20  with min assist ?Ball toss with PT standing next to treatment table (stand by assist) x 20 tosses challenging reach outside of base of support ?  ?  ?ASSESSMENT: ?  ?CLINICAL IMPRESSION: ?Patient continues to show excellent progress and is very eager to gain strength and balance.    She was, again, able to do higher level balance activities today with only cga and a few of these with only sba.    She has 2 more visits.  She would benefit from continued skilled PT for strength and balance training to reduce fall risk and improve overall function.    We may extend therapy if patient feels she could tolerate continuing to work on higher level balance activity.   ?  ?OBJECTIVE IMPAIRMENTS Abnormal gait, decreased activity tolerance, decreased mobility, and difficulty walking.  ?  ?ACTIVITY LIMITATIONS cleaning and meal prep.  ?  ?PERSONAL FACTORS Age, Fitness, and 1 comorbidity: palliative metastatic cancer  are also affecting patient's functional outcome.  ?  ?  ?REHAB POTENTIAL: Good ?  ?CLINICAL DECISION MAKING: Stable/uncomplicated ?  ?EVALUATION COMPLEXITY: Low ?  ?  ?GOALS: ?Goals reviewed with patient? Yes ?  ?  ?LONG TERM GOALS:  ?  ?LTG Name Target Date Goal status  ?1 Pt will improve berg balance score to at least  40/56 ?Baseline:33 12/26/2021 INITIAL  ?2 Pt will improve 5x sit to stand to <30 seconds to demonstrate decreased fall risk ?Baseline:34 seconds 12/26/2021 INITIAL  ?3 Pt will complete 65min walk test in clinic to demonstrate improved en

## 2021-12-03 ENCOUNTER — Other Ambulatory Visit: Payer: Self-pay

## 2021-12-03 ENCOUNTER — Encounter: Payer: Self-pay | Admitting: Rehabilitation

## 2021-12-03 ENCOUNTER — Ambulatory Visit: Payer: Medicaid Other | Admitting: Rehabilitation

## 2021-12-03 DIAGNOSIS — Z7409 Other reduced mobility: Secondary | ICD-10-CM

## 2021-12-03 DIAGNOSIS — M6281 Muscle weakness (generalized): Secondary | ICD-10-CM | POA: Diagnosis not present

## 2021-12-03 NOTE — Therapy (Signed)
?OUTPATIENT PHYSICAL THERAPY TREATMENT NOTE ? ? ?Patient Name: Gail Fitzgerald ?MRN: [id number] ?DOB:04/28/1935, 87 y.o., female ?Today's Date: 12/03/2021 ? ?PCP: [name], MD ?REFERRING PROVIDER: Geralynn Rile* ? ? PT End of Session - 12/03/21 4562   ? ? Visit Number 11   ? Number of Visits 12   ? Authorization - Visit Number 9   ? Authorization - Number of Visits 12   ? PT Start Time [phone]   ? PT Stop Time 1045   ? PT Time Calculation (min) 50 min   ? Activity Tolerance Patient tolerated treatment well   ? Behavior During Therapy [facility] for tasks assessed/performed   ? ?  ?  ? ?  ? ? ? ? ?Past Medical History:  ?Diagnosis Date  ? Hypertension   ? ?Past Surgical History:  ?Procedure Laterality Date  ? IR THORACENTESIS ASP PLEURAL SPACE W/IMG GUIDE  04/05/2021  ? THORACENTESIS N/A 04/12/2021  ? Procedure: THORACENTESIS;  Surgeon: [name], DO;  Location: [facility];  Service: Pulmonary;  Laterality: N/A;  ? ?Patient Active Problem List  ? Diagnosis Date Noted  ? Renal cell carcinoma, left ([place]) 05/09/2021  ? Encounter for antineoplastic immunotherapy 05/09/2021  ? S/P thoracentesis   ? Pleural effusion on left 03/10/2021  ? ? ?REFERRING DIAG: M19.90 (ICD-10-CM) - Unspecified osteoarthritis, unspecified site ? ?THERAPY DIAG:  ?Impaired mobility and endurance ? ?Muscle weakness (generalized) ? ?PERTINENT HISTORY:  ? Hypertension  ?Renal cell carcinoma, left (HCC) ? ?PRECAUTIONS: Active cancer ? ?SUBJECTIVE:    I have a scan tomorrow, not sure if I want to do more or if want to be done  ? ?PAIN:  ?Are you having pain? No ? ?OBJECTIVE  ?COGNITION: ?           Overall cognitive status: Within functional limits for tasks assessed  ?  ?OBSERVATIONS / OTHER ASSESSMENTS: frail older women.  Walks slowly but no LOB. Holds onto arm of daughter in law ?  ?POSTURE: increased thoracic kyphosis ?  ?  ?LYMPHEDEMA ASSESSMENTS:  ?  ?CHEMOTHERAPY: currently immunotherapy 1x per month making pt feel weak after  infusions and 1 daily pill  ?  ?  ?Test Score Norms  ?TUG 76f 22.15 seconds ?12/03/21: 20.46 seconds High Fall risk >/= 14 sec ?Any fall risk: 12+ sec ?Risk of developing disability: 9+  ?Gait Speed (distance (m)/time) 0.13 m/s ?12/03/21: 0.133m <0.69m29mhousehold only ?0.4-0.8 limited community ?0.8-1.2 community ?1.2 able to cross streets ?  ?Needs intervention for fall risk: <66m/38m?5 time sit to stand (16?) 34.6 sec - just felt muscles working ?17.92 >13.6 sec= increased disability ?>15sec predicts recurrent falls ?   ?    ?    ?    ?Berg   ?33/56 ?12/03/21: 46/56 <45 high risk of falls ?<40 almost 100% fall risk  ?  ?  ?PATIENT EDUCATION:  ?Education details: none today ?Person educated: Patient and Child(ren) ?Education method: Explanation ?Education comprehension: verbalized understanding  with use of interpreter  ?   ?Today's Treatment:  12-03-21 ?NuStep x 5 min level 4 ?Redid Berg balance, TUG, and 5x sit to stand - see results above ? ?Standing bilateral shoulder ER x 10 2# ?Standing shoulder horizontal abduction x 10 with red tband on balance pad (with cga) ?Shoulder rolls x 10 ?Seated knee extension 2 x 10 with 2.5 lb ?Seated hip flexion (march) x 20 with 2.5 lb ?Bilateral shoulder flexion 2 x 10 with 2 lb dumbells ?Single bicep curls 2  x 10 with 2 lb dumbells ?Gait in hallway x 474f without CGA/SBA still ambulating with sustained knee flexion and short steps but no LOB ? ?Today's Treatment:  11-30-21 ?NuStep x 5 min level 4 ?Standing rows with red tband x 10 standing on balance pad (with sba) ?Standing shoulder extension with red tband x 10 on balance pad (with sba) ?Standing bilateral shoulder ER x 10 with red tband on balance pad (with cga) ?Standing shoulder horizontal abduction x 10 with red tband on balance pad (with cga) ?Standing on balance pad, ball toss against wall with cga ?Shoulder rolls x 10 ?Seated HS curl (red) 2 x 10 each LE ?Seated knee extension 2 x 10 with 2.5 lb ?Seated hip flexion  (march) x 20 with 2.5 lb ?Bilateral shoulder flexion 2 x 10 with 2 lb dumbells ?Single bicep curls 2 x 10 with 2 lb dumbells ?Alternating cone taps in standing with min assist. (3 D cones today x 3 each LE) ?Step over hurdles (step to, then step through) x 3 laps each with cga ?Standing on balance pad tossing ball against wall and catching (3 direction) 2 x 10 with cga ? ? Today's Treatment:  11-28-21 ?NuStep x 5 min level 4 ?Seated: ?Standing rows with yellow tband x 10 standing on balance pad (with cga) ?Standing shoulder extension with yellow tband x 10 on balance pad (with cga) ?Standing bilateral shoulder ER x 10 with yellow tband on balance pad (with cga) ?Standing shoulder horizontal abduction x 10 with yellow tband on balance pad (with cga) ?Standing on balance pad, ball toss against wall with cga ?Shoulder rolls x 10 ?Bilateral shoulder flexion 2 x 10 with 2 lb dumbells ?Single bicep curls 2 x 10 with 2 lb dumbells ?Alternating cone taps in standing with min assist. (3 D cones today x 3 each LE) ?Step up and down on/off balance pad x 10 (also up and over today) with min assist. X 10 ?Step over hurdles (step to, then step through) x 3 laps each ?Marching on balance pad x 20 with min assist ?Mini squats on balance pad x 20  with min assist ?  ?  ?ASSESSMENT: ?  ?CLINICAL IMPRESSION: ?Pt has not improved gait speed at all but has made excellent progress with sit to stand and BERG balance score.  She still demonstrates a high fall risk but much improved.  Pt and daughter in law would like to extend POC so it was extended today x 4 weeks to focus on general strength and more gait endurance and balance.     ?  ?OBJECTIVE IMPAIRMENTS Abnormal gait, decreased activity tolerance, decreased mobility, and difficulty walking.  ?  ?ACTIVITY LIMITATIONS cleaning and meal prep.  ?  ?PERSONAL FACTORS Age, Fitness, and 1 comorbidity: palliative metastatic cancer  are also affecting patient's functional outcome.  ?  ?  ?REHAB  POTENTIAL: Good ?  ?CLINICAL DECISION MAKING: Stable/uncomplicated ?  ?EVALUATION COMPLEXITY: Low ?  ?  ?GOALS: ?Goals reviewed with patient? Yes ?  ?  ?LONG TERM GOALS:  ?  ?LTG Name Target Date Goal status  ?1 Pt will improve berg balance score to at least 48/56 ?Baseline:33, 46 on 12/03/21 12/26/2021 MET and increased  ?2 Pt will improve 5x sit to stand to <30 seconds to demonstrate decreased fall risk ?Baseline:34 seconds 12/26/2021 MET  ?3 Pt will complete 658m walk test in clinic to demonstrate improved endurance ?Baseline: walks [id number] gym 12/26/2021 INITIAL  ?4 Pt will be ind with  final HEP ?Baseline: 12/26/2021 INITIAL  ?         ?         ?         ?  ?PLAN: ?PT FREQUENCY: 2x/week ?  ?PT DURATION: 4 weeks ?  ?PLANNED INTERVENTIONS: Therapeutic exercises, Therapeutic activity, Neuro Muscular re-education, Balance training, Gait training, Patient/Family education, and Joint mobilization ?  ?PLAN FOR NEXT SESSION: Continue general TE to include UE/LE, progress balance and activity tolerance. ? ? ? ?Shan Levans, PT ? ?12/03/2308:53 AM  ? ?   ?

## 2021-12-04 ENCOUNTER — Ambulatory Visit (HOSPITAL_BASED_OUTPATIENT_CLINIC_OR_DEPARTMENT_OTHER)
Admission: RE | Admit: 2021-12-04 | Discharge: 2021-12-04 | Disposition: A | Discharge status: Home / Self Care | Payer: Medicaid Other | Source: Ambulatory Visit | Attending: Internal Medicine | Admitting: Internal Medicine

## 2021-12-04 ENCOUNTER — Encounter (HOSPITAL_BASED_OUTPATIENT_CLINIC_OR_DEPARTMENT_OTHER): Payer: Self-pay

## 2021-12-04 DIAGNOSIS — C642 Malignant neoplasm of left kidney, except renal pelvis: Secondary | ICD-10-CM | POA: Insufficient documentation

## 2021-12-05 ENCOUNTER — Ambulatory Visit: Payer: Medicaid Other

## 2021-12-05 ENCOUNTER — Other Ambulatory Visit: Payer: Self-pay

## 2021-12-05 DIAGNOSIS — M6281 Muscle weakness (generalized): Secondary | ICD-10-CM | POA: Diagnosis not present

## 2021-12-05 DIAGNOSIS — G8929 Other chronic pain: Secondary | ICD-10-CM

## 2021-12-05 DIAGNOSIS — Z7409 Other reduced mobility: Secondary | ICD-10-CM

## 2021-12-05 DIAGNOSIS — M75101 Unspecified rotator cuff tear or rupture of right shoulder, not specified as traumatic: Secondary | ICD-10-CM

## 2021-12-05 NOTE — Therapy (Signed)
?OUTPATIENT PHYSICAL THERAPY TREATMENT NOTE ? ? ?Patient Name: Gail Fitzgerald ?MRN: [id number] ?DOB:01-14-1935, 87 y.o., female ?Today's Date: 12/05/2021 ? ?PCP: [name], MD ?REFERRING PROVIDER: Geralynn Rile* ? ? PT End of Session - 12/05/21 1156   ? ? Visit Number 12   ? Number of Visits 18   ? Date for PT Re-Evaluation 01/03/22   ? Authorization Type asked for more 12/03/21   ? Authorization Time Period 12 visits  11-01-21 through 12-14-21   ? Authorization - Visit Number 12   ? Authorization - Number of Visits 12   ? PT Start Time 1148   ? PT Stop Time 1235   ? PT Time Calculation (min) 47 min   ? Activity Tolerance Patient tolerated treatment well   ? Behavior During Therapy [facility] for tasks assessed/performed   ? ?  ?  ? ?  ? ? ? ? ?Past Medical History:  ?Diagnosis Date  ? Hypertension   ? ?Past Surgical History:  ?Procedure Laterality Date  ? IR THORACENTESIS ASP PLEURAL SPACE W/IMG GUIDE  04/05/2021  ? THORACENTESIS N/A 04/12/2021  ? Procedure: THORACENTESIS;  Surgeon: [name], DO;  Location: [facility];  Service: Pulmonary;  Laterality: N/A;  ? ?Patient Active Problem List  ? Diagnosis Date Noted  ? Renal cell carcinoma, left ([place]) 05/09/2021  ? Encounter for antineoplastic immunotherapy 05/09/2021  ? S/P thoracentesis   ? Pleural effusion on left 03/10/2021  ? ? ?REFERRING DIAG: M19.90 (ICD-10-CM) - Unspecified osteoarthritis, unspecified site ? ?THERAPY DIAG:  ?Impaired mobility and endurance ? ?Muscle weakness (generalized) ? ?Rotator cuff syndrome of right shoulder ? ?Chronic right shoulder pain ? ?PERTINENT HISTORY:  ? Hypertension  ?Renal cell carcinoma, left (HCC) ? ?PRECAUTIONS: Active cancer ? ?SUBJECTIVE:    Patient states she is feeling "pretty good".   ? ?PAIN:  ?Are you having pain? No ? ?OBJECTIVE  ?COGNITION: ?           Overall cognitive status: Within functional limits for tasks assessed  ?  ?OBSERVATIONS / OTHER ASSESSMENTS: frail older women.  Walks slowly but no LOB.  Holds onto arm of daughter in law ?  ?POSTURE: increased thoracic kyphosis ?  ?  ?LYMPHEDEMA ASSESSMENTS:  ?  ?CHEMOTHERAPY: currently immunotherapy 1x per month making pt feel weak after infusions and 1 daily pill  ?  ?  ?Test Score Norms  ?TUG 64f 22.15 seconds ?12/03/21: 20.46 seconds High Fall risk >/= 14 sec ?Any fall risk: 12+ sec ?Risk of developing disability: 9+  ?Gait Speed (distance (m)/time) 0.13 m/s ?12/03/21: 0.142m <0.25m18mhousehold only ?0.4-0.8 limited community ?0.8-1.2 community ?1.2 able to cross streets ?  ?Needs intervention for fall risk: <51m/43m?5 time sit to stand (16?) 34.6 sec - just felt muscles working ?17.92 >13.6 sec= increased disability ?>15sec predicts recurrent falls ?   ?    ?    ?    ?Berg   ?33/56 ?12/03/21: 46/56 <45 high risk of falls ?<40 almost 100% fall risk  ?  ?  ?PATIENT EDUCATION:  ?Education details: none today ?Person educated: Patient and Child(ren) ?Education method: Explanation ?Education comprehension: verbalized understanding  with use of interpreter  ?   ?Today's Treatment:  12-05-21 ?NuStep x 5 min level 4 ?Standing rows with red tband x 10 standing on balance pad (with sba) ?Standing shoulder extension with red tband x 10 on balance pad (with sba) ?Standing bilateral shoulder ER x 10 with red tband on balance pad (with cga) ?Standing  shoulder horizontal abduction x 10 with red tband on balance pad (with cga) ?Standing on balance pad, ball toss against wall with cga ?Shoulder rolls x 10 ?Seated HS curl (red) 2 x 10 each LE ?Seated knee extension 2 x 10 with 2.5 lb ?Seated hip flexion (march) x 20 with 2.5 lb ?Bilateral shoulder flexion 2 x 10 with 2 lb dumbells ?Single bicep curls 2 x 10 with 2 lb dumbells ?Alternating cone taps in standing with min assist. (3 D cones today x 3 each LE) ?Step over hurdles (step to, then step through) x 3 laps each with cga ?Standing on balance pad tossing ball against wall and catching (3 direction) 2 x 10 with cga ? ?Today's  Treatment:  12-03-21 ?NuStep x 5 min level 4 ?Redid Berg balance, TUG, and 5x sit to stand - see results above ? ?Standing bilateral shoulder ER x 10 2# ?Standing shoulder horizontal abduction x 10 with red tband on balance pad (with cga) ?Shoulder rolls x 10 ?Seated knee extension 2 x 10 with 2.5 lb ?Seated hip flexion (march) x 20 with 2.5 lb ?Bilateral shoulder flexion 2 x 10 with 2 lb dumbells ?Single bicep curls 2 x 10 with 2 lb dumbells ?Gait in hallway x 450f without CGA/SBA still ambulating with sustained knee flexion and short steps but no LOB ? ?Today's Treatment:  11-30-21 ?NuStep x 5 min level 4 ?Standing rows with red tband x 10 standing on balance pad (with sba) ?Standing shoulder extension with red tband x 10 on balance pad (with sba) ?Standing bilateral shoulder ER x 10 with red tband on balance pad (with cga) ?Standing shoulder horizontal abduction x 10 with red tband on balance pad (with cga) ?Standing on balance pad, ball toss against wall with cga ?Shoulder rolls x 10 ?Seated HS curl (red) 2 x 10 each LE ?Seated knee extension 2 x 10 with 2.5 lb ?Seated hip flexion (march) x 20 with 2.5 lb ?Bilateral shoulder flexion 2 x 10 with 2 lb dumbells ?Single bicep curls 2 x 10 with 2 lb dumbells ?Alternating cone taps in standing with min assist. (3 D cones today x 3 each LE) ?Step over hurdles (step to, then step through) x 3 laps each with cga ?Standing on balance pad tossing ball against wall and catching (3 direction) 2 x 10 with cga ? ? Today's Treatment:  11-28-21 ?NuStep x 5 min level 4 ?Seated: ?Standing rows with yellow tband x 10 standing on balance pad (with cga) ?Standing shoulder extension with yellow tband x 10 on balance pad (with cga) ?Standing bilateral shoulder ER x 10 with yellow tband on balance pad (with cga) ?Standing shoulder horizontal abduction x 10 with yellow tband on balance pad (with cga) ?Standing on balance pad, ball toss against wall with cga ?Shoulder rolls x 10 ?Bilateral  shoulder flexion 2 x 10 with 2 lb dumbells ?Single bicep curls 2 x 10 with 2 lb dumbells ?Alternating cone taps in standing with min assist. (3 D cones today x 3 each LE) ?Step up and down on/off balance pad x 10 (also up and over today) with min assist. X 10 ?Step over hurdles (step to, then step through) x 3 laps each ?Marching on balance pad x 20 with min assist ?Mini squats on balance pad x 20  with min assist ?  ?  ?ASSESSMENT: ?  ?CLINICAL IMPRESSION: ?Pt continues to tolerate higher level balance tasks with decreased assistance.  POC was extended last visit x 4 weeks  to focus on general strength and more gait endurance and balance.   She is very well motivated and compliant.  She should continue to improve.  Translator mentioned that she is seeing a vast improvement since she has been with her.  She comments that she could hardly get up from a chair when she first started and needed mod to max assist to walk.    ?  ?OBJECTIVE IMPAIRMENTS Abnormal gait, decreased activity tolerance, decreased mobility, and difficulty walking.  ?  ?ACTIVITY LIMITATIONS cleaning and meal prep.  ?  ?PERSONAL FACTORS Age, Fitness, and 1 comorbidity: palliative metastatic cancer  are also affecting patient's functional outcome.  ?  ?  ?REHAB POTENTIAL: Good ?  ?CLINICAL DECISION MAKING: Stable/uncomplicated ?  ?EVALUATION COMPLEXITY: Low ?  ?  ?GOALS: ?Goals reviewed with patient? Yes ?  ?  ?LONG TERM GOALS:  ?  ?LTG Name Target Date Goal status  ?1 Pt will improve berg balance score to at least 48/56 ?Baseline:33, 46 on 12/03/21 12/26/2021 MET and increased  ?2 Pt will improve 5x sit to stand to <30 seconds to demonstrate decreased fall risk ?Baseline:34 seconds 12/26/2021 MET  ?3 Pt will complete 63mn walk test in clinic to demonstrate improved endurance ?Baseline: walks [id number] gym 12/26/2021 INITIAL  ?4 Pt will be ind with final HEP ?Baseline: 12/26/2021 INITIAL  ?         ?         ?         ?  ?PLAN: ?PT FREQUENCY: 2x/week ?   ?PT DURATION: 4 weeks ?  ?PLANNED INTERVENTIONS: Therapeutic exercises, Therapeutic activity, Neuro Muscular re-education, Balance training, Gait training, Patient/Family education, and Joint mobilizatio

## 2021-12-10 ENCOUNTER — Other Ambulatory Visit (HOSPITAL_COMMUNITY): Payer: Self-pay

## 2021-12-11 ENCOUNTER — Other Ambulatory Visit: Payer: Self-pay

## 2021-12-11 ENCOUNTER — Inpatient Hospital Stay: Payer: Medicaid Other

## 2021-12-11 ENCOUNTER — Inpatient Hospital Stay: Payer: Medicaid Other | Admitting: Nurse Practitioner

## 2021-12-11 ENCOUNTER — Inpatient Hospital Stay: Payer: Medicaid Other | Attending: Internal Medicine

## 2021-12-11 ENCOUNTER — Inpatient Hospital Stay: Payer: Medicaid Other | Admitting: Internal Medicine

## 2021-12-11 ENCOUNTER — Other Ambulatory Visit: Payer: Self-pay | Admitting: Medical Oncology

## 2021-12-11 VITALS — BP 139/70 | HR 57 | Temp 97.5°F | Resp 18 | Wt 89.3 lb

## 2021-12-11 DIAGNOSIS — Z5112 Encounter for antineoplastic immunotherapy: Secondary | ICD-10-CM | POA: Insufficient documentation

## 2021-12-11 DIAGNOSIS — C642 Malignant neoplasm of left kidney, except renal pelvis: Secondary | ICD-10-CM | POA: Diagnosis not present

## 2021-12-11 DIAGNOSIS — E871 Hypo-osmolality and hyponatremia: Secondary | ICD-10-CM | POA: Insufficient documentation

## 2021-12-11 DIAGNOSIS — C782 Secondary malignant neoplasm of pleura: Secondary | ICD-10-CM | POA: Insufficient documentation

## 2021-12-11 DIAGNOSIS — Z79899 Other long term (current) drug therapy: Secondary | ICD-10-CM | POA: Insufficient documentation

## 2021-12-11 DIAGNOSIS — D649 Anemia, unspecified: Secondary | ICD-10-CM | POA: Insufficient documentation

## 2021-12-11 LAB — CMP (CANCER CENTER ONLY)
ALT: 8 U/L (ref 0–44)
AST: 18 U/L (ref 15–41)
Albumin: 3.7 g/dL (ref 3.5–5.0)
Alkaline Phosphatase: 65 U/L (ref 38–126)
Anion gap: 9 (ref 5–15)
BUN: 14 mg/dL (ref 8–23)
CO2: 23 mmol/L (ref 22–32)
Calcium: 8.7 mg/dL — ABNORMAL LOW (ref 8.9–10.3)
Chloride: 92 mmol/L — ABNORMAL LOW (ref 98–111)
Creatinine: 0.45 mg/dL (ref 0.44–1.00)
GFR, Estimated: >60 mL/min (ref >60)
Glucose, Bld: 135 mg/dL — ABNORMAL HIGH (ref 70–99)
Potassium: 4.3 mmol/L (ref 3.5–5.1)
Sodium: 124 mmol/L — ABNORMAL LOW (ref 135–145)
Total Bilirubin: 0.4 mg/dL (ref 0.3–1.2)
Total Protein: 6.5 g/dL (ref 6.5–8.1)

## 2021-12-11 LAB — CBC WITH DIFFERENTIAL (CANCER CENTER ONLY)
Abs Immature Granulocytes: 0.02 10*3/uL (ref 0.00–0.07)
Basophils Absolute: 0 10*3/uL (ref 0.0–0.1)
Basophils Relative: 0 %
Eosinophils Absolute: 0 10*3/uL (ref 0.0–0.5)
Eosinophils Relative: 0 %
HCT: 31.1 % — ABNORMAL LOW (ref 36.0–46.0)
Hemoglobin: 10.8 g/dL — ABNORMAL LOW (ref 12.0–15.0)
Immature Granulocytes: 0 %
Lymphocytes Relative: 26 %
Lymphs Abs: 1.8 10*3/uL (ref 0.7–4.0)
MCH: 32.1 pg (ref 26.0–34.0)
MCHC: 34.7 g/dL (ref 30.0–36.0)
MCV: 92.6 fL (ref 80.0–100.0)
Monocytes Absolute: 0.8 10*3/uL (ref 0.1–1.0)
Monocytes Relative: 11 %
Neutro Abs: 4.3 10*3/uL (ref 1.7–7.7)
Neutrophils Relative %: 63 %
Platelet Count: 153 10*3/uL (ref 150–400)
RBC: 3.36 MIL/uL — ABNORMAL LOW (ref 3.87–5.11)
RDW: 17.3 % — ABNORMAL HIGH (ref 11.5–15.5)
WBC Count: 7 10*3/uL (ref 4.0–10.5)
nRBC: 0 % (ref 0.0–0.2)

## 2021-12-11 LAB — TSH: TSH: 1.61 u[IU]/mL (ref 0.308–3.960)

## 2021-12-11 MED ORDER — SODIUM CHLORIDE 0.9 % IV SOLN: Freq: Once | INTRAVENOUS | Status: AC

## 2021-12-11 MED ORDER — SODIUM CHLORIDE 0.9 % IV SOLN
480.0000 mg | Freq: Once | INTRAVENOUS | Status: AC
  Administered 2021-12-11: 480 mg via INTRAVENOUS
  Filled 2021-12-11: qty 48

## 2021-12-11 NOTE — Patient Instructions (Addendum)
[facility]  Discharge Instructions: ?Thank you for choosing [facility] to provide your oncology and hematology care.  ? ?If you have a lab appointment with the [facility], please go directly to the [facility] and check in at the registration area. ?  ?Wear comfortable clothing and clothing appropriate for easy access to any Portacath or PICC line.  ? ?We strive to give you quality time with your provider. You may need to reschedule your appointment if you arrive late (15 or more minutes).  Arriving late affects you and other patients whose appointments are after yours.  Also, if you miss three or more appointments without notifying the office, you may be dismissed from the clinic at the provider?s discretion.    ?  ?For prescription refill requests, have your pharmacy contact our office and allow 72 hours for refills to be completed.   ? ?Today you received the following chemotherapy and/or immunotherapy agent: Opdivo    ?  ?To help prevent nausea and vomiting after your treatment, we encourage you to take your nausea medication as directed. ? ?BELOW ARE SYMPTOMS THAT SHOULD BE REPORTED IMMEDIATELY: ?*FEVER GREATER THAN 100.4 F (38 ?C) OR HIGHER ?*CHILLS OR SWEATING ?*NAUSEA AND VOMITING THAT IS NOT CONTROLLED WITH YOUR NAUSEA MEDICATION ?*UNUSUAL SHORTNESS OF BREATH ?*UNUSUAL BRUISING OR BLEEDING ?*URINARY PROBLEMS (pain or burning when urinating, or frequent urination) ?*BOWEL PROBLEMS (unusual diarrhea, constipation, pain near the anus) ?TENDERNESS IN MOUTH AND THROAT WITH OR WITHOUT PRESENCE OF ULCERS (sore throat, sores in mouth, or a toothache) ?UNUSUAL RASH, SWELLING OR PAIN  ?UNUSUAL VAGINAL DISCHARGE OR ITCHING  ? ?Items with * indicate a potential emergency and should be followed up as soon as possible or go to the Emergency Department if any problems should occur. ? ?Please show the CHEMOTHERAPY ALERT CARD or IMMUNOTHERAPY ALERT CARD at check-in to the  Emergency Department and triage nurse. ? ?Should you have questions after your visit or need to cancel or reschedule your appointment, please contact [facility]  Dept: [phone]  and follow the prompts.  Office hours are 8:00 a.m. to 4:30 p.m. Monday - Friday. Please note that voicemails left after 4:00 p.m. may not be returned until the following business day.  We are closed weekends and major holidays. You have access to a nurse at all times for urgent questions. Please call the main number to the clinic Dept: [phone] and follow the prompts. ? ? ?For any non-urgent questions, you may also contact your provider using MyChart. We now offer e-Visits for anyone 55 and older to request care online for non-urgent symptoms. For details visit [email or web address]. ?  ?Also download the MyChart app! Go to the app store, search "MyChart", open the app, select [facility], and log in with your MyChart username and password. ? ?Due to Covid, a mask is required upon entering the hospital/clinic. If you do not have a mask, one will be given to you upon arrival. For doctor visits, patients may have 1 support person aged 29 or older with them. For treatment visits, patients cannot have anyone with them due to current Covid guidelines and our immunocompromised population.  ? ?Nivolumab injection ???? ???????????? ????? ??? ?????????? ?????????? -- ??? ???????? ?????????????? ???????. ?? ??????????? ??? ??????? ????????? ??????????????? ????????. ? ?? ????? ?????? ??? ??????? ?????, ??? ?????? ? ???, ??????????? ???????, ??? ??????? ? ????????. ???? ????????? ????? ??????????? ? ?????? ?????; ???? ? ??? ????????? ???????, ?????????? ? ???????????? ????????? ??? ??????????. ????????????????? ????????? ???????? (????????):  Opdivo ???? ??? ??????? ?????????? ???????????? ????????? ?? ?????? ?????? ????? ?????????? ??????????? ?????, ???? ?? ? ??? ???? ?? ????????? ?????????: ?????????????  ???????????, ????? ??? ??????? ?????, ???????? ????? ??? ????????; ????????????? ??? ??????????????? ?????????? ?????????????? ????????? ?????? (????????? ????????? ?????? ??????? ????????); ?????????? ??????? ??????? ??????? ? ???????; ??????????????? ???????; ????????????? ??????? ???????, ????? ??? ????????? ?????? ??? ??????? ??????-?????; ?????????? ??? ????????????? ??????? ?? ?????????, ?????? ?????????, ??????? ????????, ????????? ??? ???????????; ????????????? ??? ???????????? ????????????; ?????????? ??????. ???? ??? ??????? ????????? ??? ?????????? ???? ????????? ????????????? ??? ????????????? ????????. ??? ????????? ???????? ? ???????? ??? ???????????. ?????? ??????? ??????? ??? ????? ????????????? ??????????? ?????????? ? ????????? (MedGuide). ?????? ??? ??????? ??????????? ???????? ??? ??????????. ????????? ??????????? ?????????? ? ??????????? ?????????? ????? ????????? ?????. ???????? ?? ?? ??? ?? ???????????? ?????????? ??? ????? ????????? ????? ? 12 ???, ??????? ????????? ???? ????????????????. ??????????????: ???? ??? ???????, ??? ?? ????????? ?????????? ???? ????? ?????????, ?????????? ?????????? ? ????????????????? ????? ??? ???????? ????????? ??? ???????? ?????????? ??????. ??????????: ??? ????????? ????????????? ?????? ??? ???. ?? ???????? ?? ? ??????? ??????. ???? ?????????? ? ?????? ???????? ?????? ?????????? ??? ????????? ????????? ?? ????? ??? ???????? ??????????? ???. ????? ????????? ????????? ?????????. ???? ?? ?? ?????? ?????? ?? ????? ? ??????????? ?????, ???????? ?? ???? ??????????? ??????????. ?? ??? ??? ????????? ????? ???????? ?? ??????????????? ??????????????? ?? ?????????. ????? ???????? ?? ???????? ???? ????????? ??????????????. ???????????? ???????????? ????????? ?????? ???? ??????????? ???? ????????, ????????????? ????????, ?????????????? ?????????? ? ??????? ???????. ????? ???????? ??? ? ???????, ???????????? ??????????? ???????? ??? ??????????. ????????? ???????? ?????  ???????? ?? ?????????????? ? ??????????? ???? ??????????. ??? ??? ????? ???????? ???????? ??? ????????????? ????? ?????????? ??? ????? ?????? ????? ????????? ?? ?????? ??? ?????????? ??????????? ?????. ??? ????? ?????? ????? ????????? ??? ??????? ????????? ??????? ??????? ?????. ?????????? ???????????? ?? ????? ?????? ????? ????????? ? ? ??????? 5 ??????? ????? ??? ???????????. ???????? ??????? ???????? ??????????? ?????????? ? ???????????? ???????????? ??? ????????????? ? ????????? ????????????. ?????????? ??????????? ??????????? ?????????????? ??????????? ????? ????????? ?? ????. ?????????? ? ??????????? ?????????? ?? ????? ????????? ???????????. ?? ????? ?????????? ????? ????????? ? ? ??????? 5 ??????? ????? ??? ??????????? ?????? ??????? ??????? ??????. ?????? ???????? ??????? ????? ???? ??? ?????? ????? ?????????? ????????? ???????, ? ??????? ??????? ??? ????? ?????? ???????? ??????????? ??????????: ?????????????? ??????? -- ?????? ????, ???, ??????????, ???? ????, ???, ????? ??? ?????; ???? ? ???????? ????? ??? ?????? ????????????? ???; ?????????? ??????; ????? ? ?????; ???????; ?????? ??????, ?????? ??? ???????????? ???????; ????? ? ?????. ?????????? ??? ??????????? ????????????; ?????????? ???????????, ?????; ???????? ??????? ?????? ? ????? (?????????????) -- ???????? ????? ??? ?????????? ?????? ?????????? ????, ????????? ???????? ??? ????????????, ?????????? ??????; ???????? ??????? ???????? ?????????? ?????? (???????????) -- ????????? ??? ??????????? ????????????, ???????? ????? ????, ?????????? ????????????? ??? ?????????? ???????????????? ? ?????, ?????? ??? ???????????, ???????, ???????????, ???????????? ????????????? ????? ??? ??????????? ?????????; ?????????? ????? -- ?????????? ?????????? ?????????? ????, ????????? ???????, ?????? ??? ????; ???????????? ?????? -- ???? ? ?????? ??????????, ?????? ????????, ???????, ?????????????? ????, ???? ?????-??????? ??? ??????????? ?????, ?????????? ???? ???  ?????? ????, ????????? ???????? ??? ????????????; ??????? ??????? ??????????? -- ????????? ???????? ??? ????????????, ??????????????, ???????? ????, ???????????? ???????; ??????? ??????? ???????? ?????????? ??????

## 2021-12-11 NOTE — Progress Notes (Signed)
?    [facility] ?[phone]   [phone] ? ?OFFICE PROGRESS NOTE ? ?[name], MD ?[address] ?[place] [address] ? ?DIAGNOSIS: Metastatic renal cell carcinoma presented with large left renal mass in addition to significant left hemothorax pleural-based metastasis as well as left hilar and mediastinal lymphadenopathy diagnosed in August 2022. ? ?Biomarker Findings ?Microsatellite status - MS-Stable ?Tumor Mutational Burden - 2 Muts/Mb ?Genomic Findings ?For a complete list of the genes assayed, please refer to the Appendix. ?MTAP loss ?CDKN2A/B CDKN2A loss ?8 Disease relevant genes with no reportable ?Alteratio ? ?PD-L1 expression 0% ? ?PRIOR THERAPY: Systemic immunotherapy with ipilimumab 1 mg/KG in addition to nivolumab 3 mg/KG every 3 weeks for 4 cycles followed by maintenance treatment with nivolumab.  Status post 3 cycles.  First dose started on 05/22/2021.  This treatment was discontinued secondary to disease progression. ? ? ?CURRENT THERAPY: Opdivo 480 Mg IV every 4 weeks with Cabometyx 40 mg p.o. daily.  First dose August 21, 2021.  Status post 4 month of treatment ? ?INTERVAL HISTORY: ?Gail Fitzgerald 87 y.o. female returns to the clinic today for follow-up visit accompanied by her daughter-in-law.  The patient is feeling fine today with no concerning complaints except for mild fatigue and few episodes of diarrhea.  The pain on the left side of the chest has significantly improved.  She denied having any shortness of breath but has mild cough with no hemoptysis.  She has no nausea, vomiting, abdominal pain or constipation.  She denied having any recent weight loss or night sweats.  She has no headache or visual changes.  She continues to tolerate her treatment with Opdivo and Cabometyx fairly well except for few episodes of diarrhea.  The patient had repeat CT scan of the chest, abdomen pelvis performed recently and she is here for  evaluation and discussion of her scan results. ? ?MEDICAL HISTORY: ?Past Medical History:  ?Diagnosis Date  ? Hypertension   ? ? ?ALLERGIES:  has no active allergies. ? ?MEDICATIONS:  ?Current Outpatient Medications  ?Medication Sig Dispense Refill  ? Aflibercept (EYLEA IO) Inject 1 Dose into the eye every 30 (thirty) days. IO left eye monthly    ? amLODipine (NORVASC) 10 MG tablet Take 10 mg by mouth daily.    ? aspirin EC 81 MG tablet Take 81 mg by mouth every evening. Swallow whole.    ? benazepril (LOTENSIN) 20 MG tablet Take 20 mg by mouth daily.    ? cabozantinib (CABOMETYX) 40 MG tablet Take 1 tablet (40 mg total) by mouth daily. Take on an empty stomach, 1 hour before or 2 hours after meals. 30 tablet 2  ? celecoxib (CELEBREX) 100 MG capsule Take 1 capsule (100 mg total) by mouth 2 (two) times daily. 60 capsule 1  ? ferrous sulfate 325 (65 FE) MG EC tablet Take 1 tablet (325 mg total) by mouth daily with breakfast. 30 tablet 3  ? ibuprofen (ADVIL) 200 MG tablet Take 400 mg by mouth every 8 (eight) hours as needed (pain.).    ? LORazepam (ATIVAN) 0.5 MG tablet 1 tablet p.o. 30 minutes before the MRI.  May repeat once if needed. 2 tablet 0  ? Magnesium 400 MG CAPS Take 400 mg by mouth See admin instructions. Takes 1 tablet (400 mg) by mouth once daily 10 days on and 10 days off.    ? metoprolol succinate (TOPROL-XL) 100 MG 24 hr tablet Take 50 mg by mouth  in the morning and at bedtime.    ? mirtazapine (REMERON) 7.5 MG tablet TAKE 1 TABLET BY MOUTH AT BEDTIME. 90 tablet 1  ? Misc. Devices (WHEELCHAIR) MISC Light weight    ? OVER THE COUNTER MEDICATION Take 1 tablet by mouth daily as needed (for stomach discomfort). Allochol - (Herbal Supplement) Multivitamin with : Magnesium, potassium, garlic and charcoal. (Russian Medication)    ? oxyCODONE-acetaminophen (PERCOCET/ROXICET) 5-325 MG tablet Take 1 tablet by mouth every 8 (eight) hours as needed for severe pain. 30 tablet 0  ? pantoprazole (PROTONIX) 40 MG  tablet TAKE 1 TABLET BY MOUTH EVERY DAY 30 tablet 1  ? prochlorperazine (COMPAZINE) 10 MG tablet Take 1 tablet (10 mg total) by mouth every 6 (six) hours as needed for nausea or vomiting. 30 tablet 2  ? simvastatin (ZOCOR) 20 MG tablet Take 20 mg by mouth every evening.    ? UNABLE TO FIND Take 1 tablet by mouth daily as needed (chest pain (angina)). Med Name: Validol (Menthyl isovalerate and menthol) (Russian Medication)    ? ?No current facility-administered medications for this visit.  ? ? ?SURGICAL HISTORY:  ?Past Surgical History:  ?Procedure Laterality Date  ? IR THORACENTESIS ASP PLEURAL SPACE W/IMG GUIDE  04/05/2021  ? THORACENTESIS N/A 04/12/2021  ? Procedure: THORACENTESIS;  Surgeon: [name], DO;  Location: [facility];  Service: Pulmonary;  Laterality: N/A;  ? ? ?REVIEW OF SYSTEMS:  Constitutional: positive for fatigue ?Eyes: negative ?Ears, nose, mouth, throat, and face: negative ?Respiratory: positive for pleurisy/chest pain ?Cardiovascular: negative ?Gastrointestinal: positive for diarrhea ?Genitourinary:negative ?Integument/breast: negative ?Hematologic/lymphatic: negative ?Musculoskeletal:negative ?Neurological: negative ?Behavioral/Psych: negative ?Endocrine: negative ?Allergic/Immunologic: negative  ? ?PHYSICAL EXAMINATION: General appearance: alert, cooperative, fatigued, and no distress ?Head: Normocephalic, without obvious abnormality, atraumatic ?Neck: no adenopathy, no JVD, supple, symmetrical, trachea midline, and thyroid not enlarged, symmetric, no tenderness/mass/nodules ?Lymph nodes: Cervical, supraclavicular, and axillary nodes normal. ?Resp: clear to auscultation bilaterally ?Back: symmetric, no curvature. ROM normal. No CVA tenderness. ?Cardio: regular rate and rhythm, S1, S2 normal, no murmur, click, rub or gallop ?GI: soft, non-tender; bowel sounds normal; no masses,  no organomegaly ?Extremities: extremities normal, atraumatic, no cyanosis or edema ?Neurologic: Alert and  oriented X 3, normal strength and tone. Normal symmetric reflexes. Normal coordination and gait ? ?ECOG PERFORMANCE STATUS: 1 - Symptomatic but completely ambulatory ? ?Blood pressure 139/70, pulse (!) 57, temperature (!) 97.5 ?F (36.4 ?C), temperature source Tympanic, resp. rate 18, weight 89 lb 5 oz (40.5 kg), SpO2 100 %. ? ?LABORATORY DATA: ?Lab Results  ?Component Value Date  ? WBC 7.0 12/11/2021  ? HGB 10.8 (L) 12/11/2021  ? HCT 31.1 (L) 12/11/2021  ? MCV 92.6 12/11/2021  ? PLT 153 12/11/2021  ? ? ?  Chemistry   ?   ?Component Value Date/Time  ? NA 124 (L) 12/11/2021 1057  ? K 4.3 12/11/2021 1057  ? CL 92 (L) 12/11/2021 1057  ? CO2 23 12/11/2021 1057  ? BUN 14 12/11/2021 1057  ? CREATININE 0.45 12/11/2021 1057  ?    ?Component Value Date/Time  ? CALCIUM 8.7 (L) 12/11/2021 1057  ? ALKPHOS 65 12/11/2021 1057  ? AST 18 12/11/2021 1057  ? ALT 8 12/11/2021 1057  ? BILITOT 0.4 12/11/2021 1057  ?  ? ? ? ?RADIOGRAPHIC STUDIES: ?CT CHEST ABDOMEN PELVIS WO CONTRAST ? ?Result Date: 12/05/2021 ?CLINICAL DATA:  87 year old female with history of renal cell carcinoma. Follow-up study. * Tracking Code: BO * EXAM: CT CHEST, ABDOMEN AND PELVIS WITHOUT  CONTRAST TECHNIQUE: Multidetector CT imaging of the chest, abdomen and pelvis was performed following the standard protocol without IV contrast. RADIATION DOSE REDUCTION: This exam was performed according to the departmental dose-optimization program which includes automated exposure control, adjustment of the mA and/or kV according to patient size and/or use of iterative reconstruction technique. COMPARISON:  CT of the chest, abdomen and pelvis 10/03/2021. FINDINGS: CT CHEST FINDINGS Cardiovascular: Heart size is normal. There is no significant pericardial fluid, thickening or pericardial calcification. There is aortic atherosclerosis, as well as atherosclerosis of the great vessels of the mediastinum and the coronary arteries, including calcified atherosclerotic plaque in the  left main, left anterior descending, left circumflex and right coronary arteries. 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 Mediastinum/Nodes: Bulky soft tissue in the AP window nodal station strongly favo

## 2021-12-12 ENCOUNTER — Ambulatory Visit: Payer: Medicaid Other | Attending: Nurse Practitioner

## 2021-12-12 DIAGNOSIS — M25552 Pain in left hip: Secondary | ICD-10-CM | POA: Insufficient documentation

## 2021-12-12 DIAGNOSIS — Z7409 Other reduced mobility: Secondary | ICD-10-CM | POA: Diagnosis present

## 2021-12-12 DIAGNOSIS — M6281 Muscle weakness (generalized): Secondary | ICD-10-CM | POA: Diagnosis present

## 2021-12-12 DIAGNOSIS — M25511 Pain in right shoulder: Secondary | ICD-10-CM | POA: Diagnosis present

## 2021-12-12 DIAGNOSIS — M75101 Unspecified rotator cuff tear or rupture of right shoulder, not specified as traumatic: Secondary | ICD-10-CM | POA: Insufficient documentation

## 2021-12-12 DIAGNOSIS — G8929 Other chronic pain: Secondary | ICD-10-CM | POA: Insufficient documentation

## 2021-12-12 NOTE — Therapy (Signed)
?OUTPATIENT PHYSICAL THERAPY TREATMENT NOTE ? ? ?Patient Name: Gail Fitzgerald ?MRN: [id number] ?DOB:1935/09/06, 87 y.o., female ?Today's Date: 12/12/2021 ? ?PCP: [name], MD ?REFERRING PROVIDER: Geralynn Rile* ? ? PT End of Session - 12/12/21 1145   ? ? Visit Number 13   ? Date for PT Re-Evaluation 01/03/22   ? Authorization Type asked for more 12/03/21   ? Authorization - Visit Number 13   ? Authorization - Number of Visits 12   ? PT Start Time 1145   ? PT Stop Time 1225   ? PT Time Calculation (min) 40 min   ? Activity Tolerance Patient tolerated treatment well   ? Behavior During Therapy [facility] for tasks assessed/performed   ? ?  ?  ? ?  ? ? ? ? ?Past Medical History:  ?Diagnosis Date  ? Hypertension   ? ?Past Surgical History:  ?Procedure Laterality Date  ? IR THORACENTESIS ASP PLEURAL SPACE W/IMG GUIDE  04/05/2021  ? THORACENTESIS N/A 04/12/2021  ? Procedure: THORACENTESIS;  Surgeon: [name], DO;  Location: [facility];  Service: Pulmonary;  Laterality: N/A;  ? ?Patient Active Problem List  ? Diagnosis Date Noted  ? Renal cell carcinoma, left ([place]) 05/09/2021  ? Encounter for antineoplastic immunotherapy 05/09/2021  ? S/P thoracentesis   ? Pleural effusion on left 03/10/2021  ? ? ?REFERRING DIAG: M19.90 (ICD-10-CM) - Unspecified osteoarthritis, unspecified site ? ?THERAPY DIAG:  ?Impaired mobility and endurance ? ?Muscle weakness (generalized) ? ?Rotator cuff syndrome of right shoulder ? ?Chronic right shoulder pain ? ?Lateral pain of left hip ? ?PERTINENT HISTORY:  ? Hypertension  ?Renal cell carcinoma, left (HCC) ? ?PRECAUTIONS: Active cancer ? ?SUBJECTIVE:    Patient states she is feeling "pretty good".   ? ?PAIN:  ?Are you having pain? No ? ?OBJECTIVE  ?COGNITION: ?           Overall cognitive status: Within functional limits for tasks assessed  ?  ?OBSERVATIONS / OTHER ASSESSMENTS: frail older women.  Walks slowly but no LOB. Holds onto arm of daughter in law ?  ?POSTURE: increased  thoracic kyphosis ?  ?  ?LYMPHEDEMA ASSESSMENTS:  ?  ?CHEMOTHERAPY: currently immunotherapy 1x per month making pt feel weak after infusions and 1 daily pill  ?  ?  ?Test Score Norms  ?TUG 13f 22.15 seconds ?12/03/21: 20.46 seconds High Fall risk >/= 14 sec ?Any fall risk: 12+ sec ?Risk of developing disability: 9+  ?Gait Speed (distance (m)/time) 0.13 m/s ?12/03/21: 0.140m <0.45m580mhousehold only ?0.4-0.8 limited community ?0.8-1.2 community ?1.2 able to cross streets ?  ?Needs intervention for fall risk: <80m/10m?5 time sit to stand (16?) 34.6 sec - just felt muscles working ?17.92 >13.6 sec= increased disability ?>15sec predicts recurrent falls ?   ?    ?    ?    ?Berg   ?33/56 ?12/03/21: 46/56 <45 high risk of falls ?<40 almost 100% fall risk  ?  ?  ?PATIENT EDUCATION:  ?Education details: none today ?Person educated: Patient and Child(ren) ?Education method: Explanation ?Education comprehension: verbalized understanding  with use of interpreter  ?   ?Today's Treatment:  12-05-21 ?NuStep x 5 min level 4 ?Standing rows with red tband x 10 standing on balance pad (with sba) ?Standing shoulder extension with red tband x 10 on balance pad (with sba) ?Standing bilateral shoulder ER x 10 with red tband on balance pad (with cga) ?Standing shoulder horizontal abduction x 10 with red tband on balance pad (with cga) ?  Standing on balance pad, ball toss against wall with cga ?Shoulder rolls x 10 ?Seated HS curl (red) 2 x 10 each LE ?Seated knee extension 2 x 10 with 2.5 lb ?Seated hip flexion (march) x 20 with 2.5 lb ?Bilateral shoulder flexion 2 x 10 with 2 lb dumbells ?Single bicep curls 2 x 10 with 2 lb dumbells ?Alternating cone taps in standing with min assist. (3 D cones today x 3 each LE) ?Step over hurdles (step to, then step through) x 3 laps each with cga ?Standing on balance pad tossing ball against wall and catching (3 direction) 2 x 10 with cga ? ?Today's Treatment:  12-03-21 ?NuStep x 5 min level 4 ?Redid Berg  balance, TUG, and 5x sit to stand - see results above ? ?Standing bilateral shoulder ER x 10 2# ?Standing shoulder horizontal abduction x 10 with red tband on balance pad (with cga) ?Shoulder rolls x 10 ?Seated knee extension 2 x 10 with 2.5 lb ?Seated hip flexion (march) x 20 with 2.5 lb ?Bilateral shoulder flexion 2 x 10 with 2 lb dumbells ?Single bicep curls 2 x 10 with 2 lb dumbells ?Gait in hallway x 445f without CGA/SBA still ambulating with sustained knee flexion and short steps but no LOB ? ?Today's Treatment:  11-30-21 ?NuStep x 5 min level 4 ?Standing rows with red tband x 10 standing on balance pad (with sba) ?Standing shoulder extension with red tband x 10 on balance pad (with sba) ?Standing bilateral shoulder ER x 10 with red tband on balance pad (with cga) ?Standing shoulder horizontal abduction x 10 with red tband on balance pad (with cga) ?Standing on balance pad, ball toss against wall with cga ?Shoulder rolls x 10 ?Seated HS curl (red) 2 x 10 each LE ?Seated knee extension 2 x 10 with 2.5 lb ?Seated hip flexion (march) x 20 with 2.5 lb ?Bilateral shoulder flexion 2 x 10 with 2 lb dumbells ?Single bicep curls 2 x 10 with 2 lb dumbells ?Alternating cone taps in standing with min assist. (3 D cones today x 3 each LE) ?Step over hurdles (step to, then step through) x 3 laps each with cga ?Standing on balance pad tossing ball against wall and catching (3 direction) 2 x 10 with cga ? ? Today's Treatment:  11-28-21 ?NuStep x 5 min level 4 ?Seated: ?Standing rows with yellow tband x 10 standing on balance pad (with cga) ?Standing shoulder extension with yellow tband x 10 on balance pad (with cga) ?Standing bilateral shoulder ER x 10 with yellow tband on balance pad (with cga) ?Standing shoulder horizontal abduction x 10 with yellow tband on balance pad (with cga) ?Standing on balance pad, ball toss against wall with cga ?Shoulder rolls x 10 ?Bilateral shoulder flexion 2 x 10 with 2 lb dumbells ?Single bicep  curls 2 x 10 with 2 lb dumbells ?Alternating cone taps in standing with min assist. (3 D cones today x 3 each LE) ?Step up and down on/off balance pad x 10 (also up and over today) with min assist. X 10 ?Step over hurdles (step to, then step through) x 3 laps each ?Marching on balance pad x 20 with min assist ?Mini squats on balance pad x 20  with min assist ?  ?  ?ASSESSMENT: ?  ?CLINICAL IMPRESSION: ?Pt continues to tolerate higher level balance tasks with decreased assistance.  POC was extended last visit x 4 weeks to focus on general strength and more gait endurance and balance.  She is very well motivated and compliant.  She should continue to improve.  Translator mentioned that she is seeing a vast improvement since she has been with her.  She comments that she could hardly get up from a chair when she first started and needed mod to max assist to walk.    ?  ?OBJECTIVE IMPAIRMENTS Abnormal gait, decreased activity tolerance, decreased mobility, and difficulty walking.  ?  ?ACTIVITY LIMITATIONS cleaning and meal prep.  ?  ?PERSONAL FACTORS Age, Fitness, and 1 comorbidity: palliative metastatic cancer  are also affecting patient's functional outcome.  ?  ?  ?REHAB POTENTIAL: Good ?  ?CLINICAL DECISION MAKING: Stable/uncomplicated ?  ?EVALUATION COMPLEXITY: Low ?  ?  ?GOALS: ?Goals reviewed with patient? Yes ?  ?  ?LONG TERM GOALS:  ?  ?LTG Name Target Date Goal status  ?1 Pt will improve berg balance score to at least 48/56 ?Baseline:33, 46 on 12/03/21 12/26/2021 MET and increased  ?2 Pt will improve 5x sit to stand to <30 seconds to demonstrate decreased fall risk ?Baseline:34 seconds 12/26/2021 MET  ?3 Pt will complete 41mn walk test in clinic to demonstrate improved endurance ?Baseline: walks [id number] gym 12/26/2021 INITIAL  ?4 Pt will be ind with final HEP ?Baseline: 12/26/2021 INITIAL  ?         ?         ?         ?  ?PLAN: ?PT FREQUENCY: 2x/week ?  ?PT DURATION: 4 weeks ?  ?PLANNED INTERVENTIONS:  Therapeutic exercises, Therapeutic activity, Neuro Muscular re-education, Balance training, Gait training, Patient/Family education, and Joint mobilization ?  ?PLAN FOR NEXT SESSION: Continue general TE to include U

## 2021-12-13 ENCOUNTER — Other Ambulatory Visit (HOSPITAL_COMMUNITY): Payer: Self-pay

## 2021-12-14 ENCOUNTER — Ambulatory Visit: Payer: Medicaid Other | Admitting: Rehabilitation

## 2021-12-14 ENCOUNTER — Encounter: Payer: Self-pay | Admitting: Rehabilitation

## 2021-12-14 ENCOUNTER — Other Ambulatory Visit (HOSPITAL_COMMUNITY): Payer: Self-pay

## 2021-12-14 DIAGNOSIS — M6281 Muscle weakness (generalized): Secondary | ICD-10-CM

## 2021-12-14 DIAGNOSIS — Z7409 Other reduced mobility: Secondary | ICD-10-CM | POA: Diagnosis not present

## 2021-12-14 NOTE — Therapy (Signed)
?   ?OUTPATIENT PHYSICAL THERAPY TREATMENT NOTE ? ? ?Patient Name: Gail Fitzgerald ?MRN: [id number] ?DOB:27-Nov-1934, 87 y.o., female ?Today's Date: 12/14/2021 ? ?PCP: [name], MD ?REFERRING PROVIDER: Geralynn Rile* ? ? PT End of Session - 12/14/21 1058   ? ? Visit Number 14   ? Number of Visits 18   ? Date for PT Re-Evaluation 01/03/22   ? Authorization Type asked for more 12/03/21   ? PT Start Time 1102   ? PT Stop Time 3875   ? PT Time Calculation (min) 47 min   ? Activity Tolerance Patient tolerated treatment well   ? Behavior During Therapy [facility] for tasks assessed/performed   ? ?  ?  ? ?  ? ? ? ? ?Past Medical History:  ?Diagnosis Date  ? Hypertension   ? ?Past Surgical History:  ?Procedure Laterality Date  ? IR THORACENTESIS ASP PLEURAL SPACE W/IMG GUIDE  04/05/2021  ? THORACENTESIS N/A 04/12/2021  ? Procedure: THORACENTESIS;  Surgeon: [name], DO;  Location: [facility];  Service: Pulmonary;  Laterality: N/A;  ? ?Patient Active Problem List  ? Diagnosis Date Noted  ? Renal cell carcinoma, left ([place]) 05/09/2021  ? Encounter for antineoplastic immunotherapy 05/09/2021  ? S/P thoracentesis   ? Pleural effusion on left 03/10/2021  ? ? ?REFERRING DIAG: M19.90 (ICD-10-CM) - Unspecified osteoarthritis, unspecified site ? ?THERAPY DIAG:  ?Impaired mobility and endurance ? ?Muscle weakness (generalized) ? ?PERTINENT HISTORY:  ? Hypertension  ?Renal cell carcinoma, left (HCC) ? ?PRECAUTIONS: Active cancer ? ?SUBJECTIVE:    Today is the 3rd day after immunotherapy so I always feel bad.    ? ?PAIN:  ?Are you having pain? No ? ?OBJECTIVE  ?COGNITION: ?           Overall cognitive status: Within functional limits for tasks assessed  ?  ?OBSERVATIONS / OTHER ASSESSMENTS: frail older women.  Walks slowly but no LOB. Holds onto arm of daughter in law ?  ?POSTURE: increased thoracic kyphosis ?  ?  ?LYMPHEDEMA ASSESSMENTS:  ?  ?CHEMOTHERAPY: currently immunotherapy 1x per month making pt feel weak after  infusions and 1 daily pill  ?  ?  ?Test Score Norms  ?TUG 44f 22.15 seconds ?12/03/21: 20.46 seconds High Fall risk >/= 14 sec ?Any fall risk: 12+ sec ?Risk of developing disability: 9+  ?Gait Speed (distance (m)/time) 0.13 m/s ?12/03/21: 0.155m <0.74m234mhousehold only ?0.4-0.8 limited community ?0.8-1.2 community ?1.2 able to cross streets ?  ?Needs intervention for fall risk: <34m/66m?5 time sit to stand (16?) 34.6 sec - just felt muscles working ?17.92 >13.6 sec= increased disability ?>15sec predicts recurrent falls ?   ?    ?    ?    ?Berg   ?33/56 ?12/03/21: 46/56 <45 high risk of falls ?<40 almost 100% fall risk  ?  ?  ?PATIENT EDUCATION:  ?Education details: none today ?Person educated: Patient and Child(ren) ?Education method: Explanation ?Education comprehension: verbalized understanding  with use of interpreter  ? ? 12/14/21 ? NuStep x 6:30 min level 2 arms 10, seat 6 ?Standing rows with red tband x 10 standing on balance pad (with sba) ?Standing shoulder extension with red tband x 10 on balance pad (with sba) ?Standing bilateral shoulder ER x 10 with red tband on balance pad (with cga) ?Standing shoulder horizontal abduction x 10 with red tband on balance pad (with cga) ?Shoulder rolls x 10 ?Seated HS curl (red) 2 x 10 each LE ?Seated knee extension 2 x  10 with 2.5 lb ?Bilateral shoulder bicep curl with overhead press 2 x 10 with 2 lb dumbells ?In parallel bars: with hand hold assist as needed: ?Alternating black foam taps  ?Black foam slow march CGA ?Heel raises x 10 ?Standing on balance pad eyes closed 4x10"  ?Side steps in bars x 4 lengths  ? ?Today's Treatment:  12-11-21 (patient states she has low BP today and wants to do all seated exercises) ?NuStep x 5 min level 2 ?Seated rows with yellow tband 2 x 10  ?Seated bilateral shoulder ER 2 x 10 with yellow tband  ?Seated shoulder horizontal abduction 2 x 10 with yellow tband  ?Shoulder rolls x 10 ?Seated HS curl (yellow) 2 x 10 each LE ?Seated clamshell  (yellow) 2 x 10 ?Seated knee extension 2 x 10 with 2.5 lb ?Seated hip flexion (march) x 20 with 2.5 lb ?Bilateral shoulder overhead press 2 x 10 with 2 lb dumbells ?Single bicep curls 2 x 10 with 2 lb dumbells ? ?Today's Treatment:  12-05-21 ?NuStep x 5 min level 2 ?Standing rows with red tband x 10 standing on balance pad (with sba) ?Standing shoulder extension with red tband x 10 on balance pad (with sba) ?Standing bilateral shoulder ER x 10 with red tband on balance pad (with cga) ?Standing shoulder horizontal abduction x 10 with red tband on balance pad (with cga) ?Standing on balance pad, ball toss against wall with cga ?Shoulder rolls x 10 ?Seated HS curl (red) 2 x 10 each LE ?Seated knee extension 2 x 10 with 2.5 lb ?Seated hip flexion (march) x 20 with 2.5 lb ?Bilateral shoulder overhead press 2 x 10 with 2 lb dumbells ?Single bicep curls 2 x 10 with 2 lb dumbells ?Alternating cone taps in standing with min assist. (3 D cones today x 3 each LE) ?Step over hurdles (step to, then step through) x 3 laps each with cga ?Standing on balance pad tossing ball against wall and catching (3 direction) 2 x 10 with cga ? ?  ?ASSESSMENT: ?  ?CLINICAL IMPRESSION: ?Pt did well today.  She was fatigued from infusion but tolerated all well.  She continues to be very well motivated and compliant.  She would benefit from continued skilled PT for overall strength and balance.     ?  ?OBJECTIVE IMPAIRMENTS Abnormal gait, decreased activity tolerance, decreased mobility, and difficulty walking.  ?  ?ACTIVITY LIMITATIONS cleaning and meal prep.  ?  ?PERSONAL FACTORS Age, Fitness, and 1 comorbidity: palliative metastatic cancer  are also affecting patient's functional outcome.  ?  ?  ?REHAB POTENTIAL: Good ?  ?CLINICAL DECISION MAKING: Stable/uncomplicated ?  ?EVALUATION COMPLEXITY: Low ?  ?  ?GOALS: ?Goals reviewed with patient? Yes ?  ?  ?LONG TERM GOALS:  ?  ?LTG Name Target Date Goal status  ?1 Pt will improve berg balance score  to at least 48/56 ?Baseline:33, 46 on 12/03/21 12/26/2021 MET and increased  ?2 Pt will improve 5x sit to stand to <30 seconds to demonstrate decreased fall risk ?Baseline:34 seconds 12/26/2021 MET  ?3 Pt will complete 39mn walk test in clinic to demonstrate improved endurance ?Baseline: walks [address] 12/26/2021 INITIAL  ?4 Pt will be ind with final HEP ?Baseline: 12/26/2021 INITIAL  ?         ?         ?         ?  ?PLAN: ?PT FREQUENCY: 2x/week ?  ?PT DURATION: 4 weeks ?  ?  PLANNED INTERVENTIONS: Therapeutic exercises, Therapeutic activity, Neuro Muscular re-education, Balance training, Gait training, Patient/Family education, and Joint mobilization ?  ?PLAN FOR NEXT SESSION: Continue general TE to include UE/LE, progress balance and activity tolerance. ? ? ? ?Anderson Malta B. Fields, PT ?12/14/21 ?11:49 AM  ? ?   ?

## 2021-12-18 ENCOUNTER — Other Ambulatory Visit (HOSPITAL_COMMUNITY): Payer: Self-pay

## 2021-12-19 ENCOUNTER — Other Ambulatory Visit: Payer: Self-pay | Admitting: Internal Medicine

## 2021-12-19 ENCOUNTER — Ambulatory Visit: Payer: Medicaid Other

## 2021-12-19 DIAGNOSIS — G8929 Other chronic pain: Secondary | ICD-10-CM

## 2021-12-19 DIAGNOSIS — Z7409 Other reduced mobility: Secondary | ICD-10-CM | POA: Diagnosis not present

## 2021-12-19 DIAGNOSIS — M6281 Muscle weakness (generalized): Secondary | ICD-10-CM

## 2021-12-19 DIAGNOSIS — M25552 Pain in left hip: Secondary | ICD-10-CM

## 2021-12-19 DIAGNOSIS — M75101 Unspecified rotator cuff tear or rupture of right shoulder, not specified as traumatic: Secondary | ICD-10-CM

## 2021-12-19 NOTE — Therapy (Signed)
?   ?OUTPATIENT PHYSICAL THERAPY TREATMENT NOTE ? ? ?Patient Name: Gail Fitzgerald ?MRN: [id number] ?DOB:[birth year], 87 y.o., female ?Today's Date: 12/19/2021 ? ?PCP: [name], MD ?REFERRING PROVIDER: Geralynn Rile* ? ? PT End of Session - 12/19/21 1233   ? ? Visit Number 15   ? Number of Visits 18   ? Date for PT Re-Evaluation 01/03/22   ? Authorization Type 6 visits 12-15-21 thru 01/04/22   ? Authorization Time Period 6 more visits 12-15-21 through 01-04-22   ? Authorization - Visit Number 15   ? Authorization - Number of Visits 18   ? Progress Note Due on Visit 18   ? PT Start Time 1230   ? PT Stop Time 4782   ? PT Time Calculation (min) 34 min   ? Activity Tolerance Patient tolerated treatment well   ? Behavior During Therapy [facility] for tasks assessed/performed   ? ?  ?  ? ?  ? ? ? ? ?Past Medical History:  ?Diagnosis Date  ? Hypertension   ? ?Past Surgical History:  ?Procedure Laterality Date  ? IR THORACENTESIS ASP PLEURAL SPACE W/IMG GUIDE  04/05/2021  ? THORACENTESIS N/A 04/12/2021  ? Procedure: THORACENTESIS;  Surgeon: [name], DO;  Location: [place] [facility];  Service: Pulmonary;  Laterality: N/A;  ? ?Patient Active Problem List  ? Diagnosis Date Noted  ? Renal cell carcinoma, left ([place]) 05/09/2021  ? Encounter for antineoplastic immunotherapy 05/09/2021  ? S/P thoracentesis   ? Pleural effusion on left 03/10/2021  ? ? ?REFERRING DIAG: M19.90 (ICD-10-CM) - Unspecified osteoarthritis, unspecified site ? ?THERAPY DIAG:  ?Impaired mobility and endurance ? ?Muscle weakness (generalized) ? ?Rotator cuff syndrome of right shoulder ? ?Chronic right shoulder pain ? ?Lateral pain of left hip ? ?PERTINENT HISTORY:  ? Hypertension  ?Renal cell carcinoma, left (HCC) ? ?PRECAUTIONS: Active cancer ? ?SUBJECTIVE:  "A little dizzy"    ? ?PAIN:  ?Are you having pain? No ? ?OBJECTIVE  ?COGNITION: ?           Overall cognitive status: Within functional limits for tasks assessed  ?  ?OBSERVATIONS / OTHER ASSESSMENTS:  frail older women.  Walks slowly but no LOB. Holds onto arm of daughter in law ?  ?POSTURE: increased thoracic kyphosis ?  ?  ?LYMPHEDEMA ASSESSMENTS:  ?  ?CHEMOTHERAPY: currently immunotherapy 1x per month making pt feel weak after infusions and 1 daily pill  ?  ?  ?Test Score Norms  ?TUG 40f 22.15 seconds ?12/03/21: 20.46 seconds High Fall risk >/= 14 sec ?Any fall risk: 12+ sec ?Risk of developing disability: 9+  ?Gait Speed (distance (m)/time) 0.13 m/s ?12/03/21: 0.1697m <0.29m77mhousehold only ?0.4-0.8 limited community ?0.8-1.2 community ?1.2 able to cross streets ?  ?Needs intervention for fall risk: <97m/50m?5 time sit to stand (16?) 34.6 sec - just felt muscles working ?17.92 >13.6 sec= increased disability ?>15sec predicts recurrent falls ?   ?    ?    ?    ?Berg   ?33/56 ?12/03/21: 46/56 <45 high risk of falls ?<40 almost 100% fall risk  ?  ?  ?PATIENT EDUCATION:  ?Education details: none today ?Person educated: Patient and Child(ren) ?Education method: Explanation ?Education comprehension: verbalized understanding  with use of interpreter  ? ? 12/19/21 ? NuStep x 7 min level 5 arms 10, seat 6 ?Standing rows with red tband x 20 standing on balance pad (with sba) ?Standing shoulder extension with red tband x 10 on balance pad (with sba) ?  Standing bilateral shoulder ER x 10 with red tband on balance pad (with cga) ?Standing shoulder horizontal abduction x 10 with red tband on balance pad (with cga) ?Shoulder rolls x 10 ?Seated HS curl (red) 2 x 10 each LE ?Seated knee extension 2 x 10 with 3 lb ?Bilateral shoulder bicep curl with overhead press 2 x 10 with 2 lb dumbells ?In parallel bars: with hand hold assist as needed: ?Alternating 4 inch taps  ?Heel raises x 10 ? ? ?12/14/21 ? NuStep x 6:30 min level 2 arms 10, seat 6 ?Standing rows with red tband x 10 standing on balance pad (with sba) ?Standing shoulder extension with red tband x 10 on balance pad (with sba) ?Standing bilateral shoulder ER x 10 with red tband  on balance pad (with cga) ?Standing shoulder horizontal abduction x 10 with red tband on balance pad (with cga) ?Shoulder rolls x 10 ?Seated HS curl (red) 2 x 10 each LE ?Seated knee extension 2 x 10 with 2.5 lb ?Bilateral shoulder bicep curl with overhead press 2 x 10 with 2 lb dumbells ?In parallel bars: with hand hold assist as needed: ?Alternating black foam taps  ?Black foam slow march CGA ?Heel raises x 10 ?Standing on balance pad eyes closed 4x10"  ?Side steps in bars x 4 lengths  ? ?Today's Treatment:  12-11-21 (patient states she has low BP today and wants to do all seated exercises) ?NuStep x 5 min level 2 ?Seated rows with yellow tband 2 x 10  ?Seated bilateral shoulder ER 2 x 10 with yellow tband  ?Seated shoulder horizontal abduction 2 x 10 with yellow tband  ?Shoulder rolls x 10 ?Seated HS curl (yellow) 2 x 10 each LE ?Seated clamshell (yellow) 2 x 10 ?Seated knee extension 2 x 10 with 2.5 lb ?Seated hip flexion (march) x 20 with 2.5 lb ?Bilateral shoulder overhead press 2 x 10 with 2 lb dumbells ?Single bicep curls 2 x 10 with 2 lb dumbells ? ?Today's Treatment:  12-05-21 ?NuStep x 5 min level 2 ?Standing rows with red tband x 10 standing on balance pad (with sba) ?Standing shoulder extension with red tband x 10 on balance pad (with sba) ?Standing bilateral shoulder ER x 10 with red tband on balance pad (with cga) ?Standing shoulder horizontal abduction x 10 with red tband on balance pad (with cga) ?Standing on balance pad, ball toss against wall with cga ?Shoulder rolls x 10 ?Seated HS curl (red) 2 x 10 each LE ?Seated knee extension 2 x 10 with 2.5 lb ?Seated hip flexion (march) x 20 with 2.5 lb ?Bilateral shoulder overhead press 2 x 10 with 2 lb dumbells ?Single bicep curls 2 x 10 with 2 lb dumbells ?Alternating cone taps in standing with min assist. (3 D cones today x 3 each LE) ?Step over hurdles (step to, then step through) x 3 laps each with cga ?Standing on balance pad tossing ball against wall  and catching (3 direction) 2 x 10 with cga ? ?  ?ASSESSMENT: ?  ?CLINICAL IMPRESSION: ?Pt was able to do several higher level exercises with decreased rest breaks.  She has no loss of balance on any foam standing exercises or with alternating tap ups.    She would benefit from continued skilled PT for overall strength and balance.     ?  ?OBJECTIVE IMPAIRMENTS Abnormal gait, decreased activity tolerance, decreased mobility, and difficulty walking.  ?  ?ACTIVITY LIMITATIONS cleaning and meal prep.  ?  ?PERSONAL FACTORS  Age, Fitness, and 1 comorbidity: palliative metastatic cancer  are also affecting patient's functional outcome.  ?  ?  ?REHAB POTENTIAL: Good ?  ?CLINICAL DECISION MAKING: Stable/uncomplicated ?  ?EVALUATION COMPLEXITY: Low ?  ?  ?GOALS: ?Goals reviewed with patient? Yes ?  ?  ?LONG TERM GOALS:  ?  ?LTG Name Target Date Goal status  ?1 Pt will improve berg balance score to at least 48/56 ?Baseline:33, 46 on 12/03/21 12/26/2021 MET and increased  ?2 Pt will improve 5x sit to stand to <30 seconds to demonstrate decreased fall risk ?Baseline:34 seconds 12/26/2021 MET  ?3 Pt will complete 43mn walk test in clinic to demonstrate improved endurance ?Baseline: walks [address] 12/26/2021 INITIAL  ?4 Pt will be ind with final HEP ?Baseline: 12/26/2021 INITIAL  ?         ?         ?         ?  ?PLAN: ?PT FREQUENCY: 2x/week ?  ?PT DURATION: 4 weeks ?  ?PLANNED INTERVENTIONS: Therapeutic exercises, Therapeutic activity, Neuro Muscular re-education, Balance training, Gait training, Patient/Family education, and Joint mobilization ?  ?PLAN FOR NEXT SESSION: Continue general TE to include UE/LE, progress balance and activity tolerance. ? ? ? ?JeAnderson Malta. Sparsh Callens, PT ?12/19/21 1:07 PM  ? ?   ?

## 2021-12-21 ENCOUNTER — Ambulatory Visit: Payer: Medicaid Other

## 2021-12-21 ENCOUNTER — Other Ambulatory Visit (HOSPITAL_COMMUNITY): Payer: Self-pay

## 2021-12-21 DIAGNOSIS — M25552 Pain in left hip: Secondary | ICD-10-CM

## 2021-12-21 DIAGNOSIS — M75101 Unspecified rotator cuff tear or rupture of right shoulder, not specified as traumatic: Secondary | ICD-10-CM

## 2021-12-21 DIAGNOSIS — G8929 Other chronic pain: Secondary | ICD-10-CM

## 2021-12-21 DIAGNOSIS — Z7409 Other reduced mobility: Secondary | ICD-10-CM | POA: Diagnosis not present

## 2021-12-21 DIAGNOSIS — M6281 Muscle weakness (generalized): Secondary | ICD-10-CM

## 2021-12-21 NOTE — Therapy (Signed)
?   ?OUTPATIENT PHYSICAL THERAPY TREATMENT NOTE ? ? ?Patient Name: Gail Fitzgerald ?MRN: [id number] ?DOB:1935/05/22, 87 y.o., female ?Today's Date: 12/21/2021 ? ?PCP: [name], MD ?REFERRING PROVIDER: Geralynn Rile* ? ? PT End of Session - 12/21/21 0850   ? ? Visit Number 16   ? Number of Visits 18   ? Date for PT Re-Evaluation 01/03/22   ? Authorization Type 6 visits 12-15-21 thru 01/04/22   ? Authorization Time Period 6 more visits 12-15-21 through 01-04-22   ? Authorization - Visit Number 16   ? Authorization - Number of Visits 18   ? Progress Note Due on Visit 18   ? PT Start Time 0845   ? PT Stop Time 0930   ? PT Time Calculation (min) 45 min   ? Activity Tolerance Patient tolerated treatment well   ? Behavior During Therapy [facility] for tasks assessed/performed   ? ?  ?  ? ?  ? ? ? ? ?Past Medical History:  ?Diagnosis Date  ? Hypertension   ? ?Past Surgical History:  ?Procedure Laterality Date  ? IR THORACENTESIS ASP PLEURAL SPACE W/IMG GUIDE  04/05/2021  ? THORACENTESIS N/A 04/12/2021  ? Procedure: THORACENTESIS;  Surgeon: [name], DO;  Location: [place] [facility];  Service: Pulmonary;  Laterality: N/A;  ? ?Patient Active Problem List  ? Diagnosis Date Noted  ? Renal cell carcinoma, left ([place]) 05/09/2021  ? Encounter for antineoplastic immunotherapy 05/09/2021  ? S/P thoracentesis   ? Pleural effusion on left 03/10/2021  ? ? ?REFERRING DIAG: M19.90 (ICD-10-CM) - Unspecified osteoarthritis, unspecified site ? ?THERAPY DIAG:  ?Impaired mobility and endurance ? ?Muscle weakness (generalized) ? ?Rotator cuff syndrome of right shoulder ? ?Chronic right shoulder pain ? ?Lateral pain of left hip ? ?PERTINENT HISTORY:  ? Hypertension  ?Renal cell carcinoma, left (HCC) ? ?PRECAUTIONS: Active cancer ? ?SUBJECTIVE:  Patient states her BP is a little up and she isn't feeling as well.  She would like to keep her exercises light today.     ? ?PAIN:  ?Are you having pain? No ? ?OBJECTIVE  ?COGNITION: ?            Overall cognitive status: Within functional limits for tasks assessed  ?  ?OBSERVATIONS / OTHER ASSESSMENTS: frail older women.  Walks slowly but no LOB. Holds onto arm of daughter in law ?  ?POSTURE: increased thoracic kyphosis ?  ?  ?LYMPHEDEMA ASSESSMENTS:  ?  ?CHEMOTHERAPY: currently immunotherapy 1x per month making pt feel weak after infusions and 1 daily pill  ?  ?  ?Test Score Norms  ?TUG 42f 22.15 seconds ?12/03/21: 20.46 seconds High Fall risk >/= 14 sec ?Any fall risk: 12+ sec ?Risk of developing disability: 9+  ?Gait Speed (distance (m)/time) 0.13 m/s ?12/03/21: 0.120m <0.341m39mhousehold only ?0.4-0.8 limited community ?0.8-1.2 community ?1.2 able to cross streets ?  ?Needs intervention for fall risk: <41m/19m?5 time sit to stand (16?) 34.6 sec - just felt muscles working ?17.92 >13.6 sec= increased disability ?>15sec predicts recurrent falls ?   ?    ?    ?    ?Berg   ?33/56 ?12/03/21: 46/56 <45 high risk of falls ?<40 almost 100% fall risk  ?  ?  ?PATIENT EDUCATION:  ?Education details: none today ?Person educated: Patient and Child(ren) ?Education method: Explanation ?Education comprehension: verbalized understanding  with use of interpreter  ?Today's Treatment ?12-21-21 (patient states she has slightly elevated BP today and wants to do light exercises) ?NuStep  x 5 min level 4 ?Seated rows with red tband 2 x 10  ?Seated bilateral shoulder ER 2 x 10 with red tband  ?Seated shoulder horizontal abduction 2 x 10 with red tband  ?Shoulder rolls x 10 ?Seated HS curl (red) 2 x 10 each LE ?Seated clamshell (red) 2 x 10 ?Seated knee extension 2 x 10 with 3 lb ?Seated hip flexion (march) x 20 with 3 lb ?Bilateral shoulder overhead press 2 x 10 with 2 lb dumbells ?Single bicep curls 2 x 10 with 2 lb dumbells  ? ?12/19/21 ? NuStep x 7 min level 5 arms 10, seat 6 ?Standing rows with red tband x 20 standing on balance pad (with sba) ?Standing shoulder extension with red tband x 10 on balance pad (with sba) ?Standing  bilateral shoulder ER x 10 with red tband on balance pad (with cga) ?Standing shoulder horizontal abduction x 10 with red tband on balance pad (with cga) ?Shoulder rolls x 10 ?Seated HS curl (red) 2 x 10 each LE ?Seated knee extension 2 x 10 with 3 lb ?Bilateral shoulder bicep curl with overhead press 2 x 10 with 2 lb dumbells ?In parallel bars: with hand hold assist as needed: ?Alternating 4 inch taps  ?Heel raises x 10 ? ? ?12/14/21 ? NuStep x 6:30 min level 2 arms 10, seat 6 ?Standing rows with red tband x 10 standing on balance pad (with sba) ?Standing shoulder extension with red tband x 10 on balance pad (with sba) ?Standing bilateral shoulder ER x 10 with red tband on balance pad (with cga) ?Standing shoulder horizontal abduction x 10 with red tband on balance pad (with cga) ?Shoulder rolls x 10 ?Seated HS curl (red) 2 x 10 each LE ?Seated knee extension 2 x 10 with 2.5 lb ?Bilateral shoulder bicep curl with overhead press 2 x 10 with 2 lb dumbells ?In parallel bars: with hand hold assist as needed: ?Alternating black foam taps  ?Black foam slow march CGA ?Heel raises x 10 ?Standing on balance pad eyes closed 4x10"  ?Side steps in bars x 4 lengths  ? ? ?  ?ASSESSMENT: ?  ?CLINICAL IMPRESSION: ?Pt was able to complete all seated tasks with some minor fatigue. She continues to be well motivated.  Interpreter, who is her granddaughter today, states patient was doing a lot of cleaning for "cleaning Thursday" which is an Easter season tradition and part of her religion.  This includes cleaning floors on her hands and knees.     She would benefit from continued skilled PT for overall strength and balance.     ?  ?OBJECTIVE IMPAIRMENTS Abnormal gait, decreased activity tolerance, decreased mobility, and difficulty walking.  ?  ?ACTIVITY LIMITATIONS cleaning and meal prep.  ?  ?PERSONAL FACTORS Age, Fitness, and 1 comorbidity: palliative metastatic cancer  are also affecting patient's functional outcome.  ?  ?  ?REHAB  POTENTIAL: Good ?  ?CLINICAL DECISION MAKING: Stable/uncomplicated ?  ?EVALUATION COMPLEXITY: Low ?  ?  ?GOALS: ?Goals reviewed with patient? Yes ?  ?  ?LONG TERM GOALS:  ?  ?LTG Name Target Date Goal status  ?1 Pt will improve berg balance score to at least 48/56 ?Baseline:33, 46 on 12/03/21 12/26/2021 MET and increased  ?2 Pt will improve 5x sit to stand to <30 seconds to demonstrate decreased fall risk ?Baseline:34 seconds 12/26/2021 MET  ?3 Pt will complete 58mn walk test in clinic to demonstrate improved endurance ?Baseline: walks 8[address]far[address] gym 12/26/2021 INITIAL  ?4 Pt  will be ind with final HEP ?Baseline: 12/26/2021 INITIAL  ?         ?         ?         ?  ?PLAN: ?PT FREQUENCY: 2x/week ?  ?PT DURATION: 4 weeks ?  ?PLANNED INTERVENTIONS: Therapeutic exercises, Therapeutic activity, Neuro Muscular re-education, Balance training, Gait training, Patient/Family education, and Joint mobilization ?  ?PLAN FOR NEXT SESSION: Continue general TE to include UE/LE, progress balance and activity tolerance. ? ? ? ?Anderson Malta B. Heinrich Fertig, PT ?12/21/21 9:32 AM  ? ?   ?

## 2021-12-26 ENCOUNTER — Ambulatory Visit: Payer: Medicaid Other

## 2021-12-26 DIAGNOSIS — M25552 Pain in left hip: Secondary | ICD-10-CM

## 2021-12-26 DIAGNOSIS — G8929 Other chronic pain: Secondary | ICD-10-CM

## 2021-12-26 DIAGNOSIS — M6281 Muscle weakness (generalized): Secondary | ICD-10-CM

## 2021-12-26 DIAGNOSIS — M75101 Unspecified rotator cuff tear or rupture of right shoulder, not specified as traumatic: Secondary | ICD-10-CM

## 2021-12-26 DIAGNOSIS — Z7409 Other reduced mobility: Secondary | ICD-10-CM

## 2021-12-26 NOTE — Therapy (Signed)
?   ?OUTPATIENT PHYSICAL THERAPY TREATMENT NOTE ? ? ?Patient Name: Gail Fitzgerald ?MRN: [id number] ?DOB:1935-03-27, 87 y.o., female ?Today's Date: 12/26/2021 ? ?PCP: [name], MD ?REFERRING PROVIDER: Geralynn Rile* ? ? PT End of Session - 12/26/21 1111   ? ? Visit Number 17   ? Number of Visits 18   ? Date for PT Re-Evaluation 01/03/22   ? Authorization Type 6 visits 12-15-21 thru 01/04/22   ? Authorization Time Period 6 more visits 12-15-21 through 01-04-22   ? Authorization - Number of Visits 18   ? Progress Note Due on Visit 18   ? PT Start Time 1100   ? PT Stop Time 1145   ? PT Time Calculation (min) 45 min   ? Activity Tolerance Patient tolerated treatment well   ? Behavior During Therapy [facility] for tasks assessed/performed   ? ?  ?  ? ?  ? ? ? ? ?Past Medical History:  ?Diagnosis Date  ? Hypertension   ? ?Past Surgical History:  ?Procedure Laterality Date  ? IR THORACENTESIS ASP PLEURAL SPACE W/IMG GUIDE  04/05/2021  ? THORACENTESIS N/A 04/12/2021  ? Procedure: THORACENTESIS;  Surgeon: [name], DO;  Location: [place] [facility];  Service: Pulmonary;  Laterality: N/A;  ? ?Patient Active Problem List  ? Diagnosis Date Noted  ? Renal cell carcinoma, left ([place]) 05/09/2021  ? Encounter for antineoplastic immunotherapy 05/09/2021  ? S/P thoracentesis   ? Pleural effusion on left 03/10/2021  ? ? ?REFERRING DIAG: M19.90 (ICD-10-CM) - Unspecified osteoarthritis, unspecified site ? ?THERAPY DIAG:  ?Impaired mobility and endurance ? ?Muscle weakness (generalized) ? ?Rotator cuff syndrome of right shoulder ? ?Chronic right shoulder pain ? ?Lateral pain of left hip ? ?PERTINENT HISTORY:  ? Hypertension  ?Renal cell carcinoma, left (HCC) ? ?PRECAUTIONS: Active cancer ? ?SUBJECTIVE:  Patient states she is doing ok.  My head still feels "a little floaty".  Requests light session today.       ? ?PAIN:  ?Are you having pain? No ? ?OBJECTIVE  ?COGNITION: ?           Overall cognitive status: Within functional limits for  tasks assessed  ?  ?OBSERVATIONS / OTHER ASSESSMENTS: frail older women.  Walks slowly but no LOB. Holds onto arm of daughter in law ?  ?POSTURE: increased thoracic kyphosis ?  ?  ?LYMPHEDEMA ASSESSMENTS:  ?  ?CHEMOTHERAPY: currently immunotherapy 1x per month making pt feel weak after infusions and 1 daily pill  ?  ?  ?Test Score Norms  ?TUG 17f 22.15 seconds ?12/03/21: 20.46 seconds High Fall risk >/= 14 sec ?Any fall risk: 12+ sec ?Risk of developing disability: 9+  ?Gait Speed (distance (m)/time) 0.13 m/s ?12/03/21: 0.1412m <0.12m63mhousehold only ?0.4-0.8 limited community ?0.8-1.2 community ?1.2 able to cross streets ?  ?Needs intervention for fall risk: <49m/35m?5 time sit to stand (16?) 34.6 sec - just felt muscles working ?17.92 >13.6 sec= increased disability ?>15sec predicts recurrent falls ?   ?    ?    ?    ?Berg   ?33/56 ?12/03/21: 46/56 <45 high risk of falls ?<40 almost 100% fall risk  ?  ?  ?PATIENT EDUCATION:  ?Education details: none today ?Person educated: Patient and Child(ren) ?Education method: Explanation ?Education comprehension: verbalized understanding  with use of interpreter  ? ?Today's Treatment (online interpreter present) ?12-26-21 (patient states she has slightly elevated BP again today and wants to do light exercises) ?Measure BP: 155/64 (HR 62) ?NuStep x  5 min level 5 ?Seated rows with red tband 2 x 10  ?Seated bilateral shoulder ER 2 x 10 with red tband  ?Seated shoulder horizontal abduction 2 x 10 with red tband  ?Shoulder rolls x 10 ?Seated HS curl (red) 2 x 10 each LE ?Seated clamshell (red) 2 x 10 ?Seated knee extension 2 x 10 with 3 lb ?Seated hip flexion (march) x 20 with 3 lb ?Bilateral shoulder overhead press 2 x 10 with 2 lb dumbells ?Single bicep curls 2 x 10 with 2 lb dumbells  ?Sit to stand x 10 with 5 lb kb on balance pad ?Standing on balance pad march x 20 ?Standing on balance pad mini squats x 10 ? ?12-21-21 (patient states she has slightly elevated BP today and wants to  do light exercises) ?NuStep x 5 min level 4 ?Seated rows with red tband 2 x 10  ?Seated bilateral shoulder ER 2 x 10 with red tband  ?Seated shoulder horizontal abduction 2 x 10 with red tband  ?Shoulder rolls x 10 ?Seated HS curl (red) 2 x 10 each LE ?Seated clamshell (red) 2 x 10 ?Seated knee extension 2 x 10 with 3 lb ?Seated hip flexion (march) x 20 with 3 lb ?Bilateral shoulder overhead press 2 x 10 with 2 lb dumbells ?Single bicep curls 2 x 10 with 2 lb dumbells  ? ?12/19/21 ? NuStep x 7 min level 5 arms 10, seat 6 ?Standing rows with red tband x 20 standing on balance pad (with sba) ?Standing shoulder extension with red tband x 10 on balance pad (with sba) ?Standing bilateral shoulder ER x 10 with red tband on balance pad (with cga) ?Standing shoulder horizontal abduction x 10 with red tband on balance pad (with cga) ?Shoulder rolls x 10 ?Seated HS curl (red) 2 x 10 each LE ?Seated knee extension 2 x 10 with 3 lb ?Bilateral shoulder bicep curl with overhead press 2 x 10 with 2 lb dumbells ?In parallel bars: with hand hold assist as needed: ?Alternating 4 inch taps  ?Heel raises x 10 ? ? ?  ?ASSESSMENT: ?  ?CLINICAL IMPRESSION: ?Pt was able to complete all seated tasks with minimal fatigue. PT then asked if she felt she could do some standing balance pad activities.  She agreed to try.  She completed several challenging tasks with moderate fatigue.  She continues to be well motivated.  Online interpreter present throughout.  She has one visit left.  We will re-assess next visit to see if continued skilled PT is indicated.    She would benefit from continued skilled PT or transition to independent fitness program for overall strength and endurance training.     ?  ?OBJECTIVE IMPAIRMENTS Abnormal gait, decreased activity tolerance, decreased mobility, and difficulty walking.  ?  ?ACTIVITY LIMITATIONS cleaning and meal prep.  ?  ?PERSONAL FACTORS Age, Fitness, and 1 comorbidity: palliative metastatic cancer  are  also affecting patient's functional outcome.  ?  ?  ?REHAB POTENTIAL: Good ?  ?CLINICAL DECISION MAKING: Stable/uncomplicated ?  ?EVALUATION COMPLEXITY: Low ?  ?  ?GOALS: ?Goals reviewed with patient? Yes ?  ?  ?LONG TERM GOALS:  ?  ?LTG Name Target Date Goal status  ?1 Pt will improve berg balance score to at least 48/56 ?Baseline:33, 46 on 12/03/21 12/26/2021 MET and increased  ?2 Pt will improve 5x sit to stand to <30 seconds to demonstrate decreased fall risk ?Baseline:34 seconds 12/26/2021 MET  ?3 Pt will complete 87mn walk test in  clinic to demonstrate improved endurance ?Baseline: walks 28f around gym 12/26/2021 INITIAL  ?4 Pt will be ind with final HEP ?Baseline: 12/26/2021 INITIAL  ?         ?         ?         ?  ?PLAN: ?PT FREQUENCY: 2x/week ?  ?PT DURATION: 4 weeks ?  ?PLANNED INTERVENTIONS: Therapeutic exercises, Therapeutic activity, Neuro Muscular re-education, Balance training, Gait training, Patient/Family education, and Joint mobilization ?  ?PLAN FOR NEXT SESSION: Re-assess for DC or Continue general TE to include UE/LE, progress balance and activity tolerance. ? ? ? ?JAnderson MaltaB. Jenelle Drennon, PT ?12/26/21 11:47 AM  ? ?   ?

## 2021-12-28 ENCOUNTER — Ambulatory Visit: Payer: Medicaid Other

## 2021-12-28 DIAGNOSIS — M6281 Muscle weakness (generalized): Secondary | ICD-10-CM

## 2021-12-28 DIAGNOSIS — Z7409 Other reduced mobility: Secondary | ICD-10-CM

## 2021-12-28 DIAGNOSIS — M75101 Unspecified rotator cuff tear or rupture of right shoulder, not specified as traumatic: Secondary | ICD-10-CM

## 2021-12-28 DIAGNOSIS — G8929 Other chronic pain: Secondary | ICD-10-CM

## 2021-12-28 NOTE — Therapy (Addendum)
?   ?OUTPATIENT PHYSICAL THERAPY DISCHARGE ? ? ?Patient Name: Gail Fitzgerald ?MRN: [id number] ?DOB:12-19-1934, 87 y.o., female ?Today's Date: 12/28/2021 ? ?PCP: [name], MD ?REFERRING PROVIDER: Geralynn Rile* ? ? PT End of Session - 12/28/21 1103   ? ? Visit Number 18   ? Number of Visits 18   ? Date for PT Re-Evaluation 01/03/22   ? Authorization Type 6 visits 12-15-21 thru 01/04/22   ? Authorization Time Period 6 more visits 12-15-21 through 01-04-22   ? Authorization - Visit Number 18   ? Authorization - Number of Visits 18   ? Progress Note Due on Visit 18   ? PT Start Time 1015   ? PT Stop Time 1100   ? PT Time Calculation (min) 45 min   ? Activity Tolerance Patient tolerated treatment well   ? Behavior During Therapy [facility] for tasks assessed/performed   ? ?  ?  ? ?  ? ? ? ? ? ?Past Medical History:  ?Diagnosis Date  ? Hypertension   ? ?Past Surgical History:  ?Procedure Laterality Date  ? IR THORACENTESIS ASP PLEURAL SPACE W/IMG GUIDE  04/05/2021  ? THORACENTESIS N/A 04/12/2021  ? Procedure: THORACENTESIS;  Surgeon: [name], DO;  Location: [place];  Service: Pulmonary;  Laterality: N/A;  ? ?Patient Active Problem List  ? Diagnosis Date Noted  ? Renal cell carcinoma, left ([place]) 05/09/2021  ? Encounter for antineoplastic immunotherapy 05/09/2021  ? S/P thoracentesis   ? Pleural effusion on left 03/10/2021  ? ? ?REFERRING DIAG: M19.90 (ICD-10-CM) - Unspecified osteoarthritis, unspecified site ? ?THERAPY DIAG:  ?Impaired mobility and endurance ? ?Muscle weakness (generalized) ? ?Rotator cuff syndrome of right shoulder ? ?Chronic right shoulder pain ? ?PERTINENT HISTORY:  ? Hypertension  ?Renal cell carcinoma, left (HCC) ? ?PRECAUTIONS: Active cancer ? ?SUBJECTIVE:  Patient states she is doing great and feels strong today.  She understands this is her last visit and she is ready to continue her program on her own.        ? ?PAIN:  ?Are you having pain? No ? ?OBJECTIVE  ?COGNITION: ?            Overall cognitive status: Within functional limits for tasks assessed  ?  ?OBSERVATIONS / OTHER ASSESSMENTS: patient now ambulates without a.d. and no assist with good heel to toe progression ? ?POSTURE: increased thoracic kyphosis ?  ?  ?LYMPHEDEMA ASSESSMENTS:  ?  ?CHEMOTHERAPY: currently immunotherapy 1x per month making pt feel weak after infusions and 1 daily pill  ?  ?  ?Test Score Norms  ?TUG 39f 22.15 seconds ?12/03/21: 20.46 seconds ?12/28/21 : 13.04 seconds High Fall risk >/= 14 sec ?Any fall risk: 12+ sec ?Risk of developing disability: 9+  ?Gait Speed (distance (m)/time) 0.13 m/s ?12/03/21: 0.110m ?Not measured on DC due to time constraints <0.74m45mhousehold only ?0.4-0.8 limited community ?0.8-1.2 community ?1.2 able to cross streets ?  ?Needs intervention for fall risk: <77m/33m?5 time sit to stand (16?) 34.6 sec - just felt muscles working ? ?(12-28-21) 13.16 sec >13.6 sec= increased disability ?>15sec predicts recurrent falls ?   ?    ?    ?    ?Berg   ?33/56 ?12/03/21: 46/56 ?Not measured on DC due to time constraints <45 high risk of falls ?<40 almost 100% fall risk  ?  ?  ?PATIENT EDUCATION:  ?Access Code: [email or web address]: [email or web address] ?Date: 12/28/2021 ?Prepared by: JennCandyce ChurnExercises ?- Standing Shoulder Row with  Anchored Resistance  - 1 x daily - 7 x weekly - 2 sets - 10 reps ?- Standing Shoulder External Rotation with Resistance  - 1 x daily - 7 x weekly - 2 sets - 10 reps ?- Seated Shoulder Horizontal Abduction with Resistance  - 1 x daily - 7 x weekly - 2 sets - 10 reps ?- Seated Bicep Curls Supinated with Dumbbells  - 1 x daily - 7 x weekly - 2 sets - 10 reps ?- Seated Shoulder Press with Resistance  - 1 x daily - 7 x weekly - 2 sets - 10 reps ?- Standing Chest Fly at [place]  - 1 x daily - 7 x weekly - 2 sets - 10 reps ?- Seated Hip Abduction with Resistance  - 1 x daily - 7 x weekly - 2 sets - 10 reps ?- Seated Long Arc Quad with Ankle Weight  - 1 x daily - 7 x  weekly - 2 sets - 10 reps ?- Seated Hamstring Curl with Anchored Resistance  - 1 x daily - 7 x weekly - 2 sets - 10 reps ?- Seated Hip Flexion March with Ankle Weights  - 1 x daily - 7 x weekly - 2 sets - 10 reps ?- Seated Marching with Opposite Shoulder Flexion  - 1 x daily - 7 x weekly - 2 sets - 10 reps ?- Squat with Chair Touch  - 1 x daily - 7 x weekly - 3 sets - 10 reps ?Education details: reviewed HEP ?Person educated: Patient and interpreter ?Education method: Explanation ?Education comprehension: verbalized understanding and returned demonstration with use of interpreter  ? ?Today's Treatment (interpreter present) ?12-28-21  ?Measure BP: 155/64 (HR 62) ?NuStep x 5 min level 5 ?Re-assessment for DC completed ?Reviewed HEP and discussed DC plan and considerations for progression of fitness routine ?Seated rows with red tband  x 10  ?Seated bilateral shoulder ER  x 10 with red tband  ?Seated shoulder horizontal abduction  x 10 with red tband  ?Seated HS curl (red)  x 10 each LE ?Seated clamshell (red)  x 10 ?Seated knee extension  x 10 with 3 lb ?Seated hip flexion (march) x 20 with 3 lb ?Bilateral shoulder overhead press  x 10 with 2 lb dumbells ?Single bicep curls  x 10 with 2 lb dumbells  ?Sit to stand x 10  ?(All reps x 10 today to allow time for DC assessment and testing) ? ?Today's Treatment (online interpreter present) ?12-26-21 (patient states she has slightly elevated BP again today and wants to do light exercises) ?Measure BP: 155/64 (HR 62) ?NuStep x 5 min level 5 ?Seated rows with red tband 2 x 10  ?Seated bilateral shoulder ER 2 x 10 with red tband  ?Seated shoulder horizontal abduction 2 x 10 with red tband  ?Shoulder rolls x 10 ?Seated HS curl (red) 2 x 10 each LE ?Seated clamshell (red) 2 x 10 ?Seated knee extension 2 x 10 with 3 lb ?Seated hip flexion (march) x 20 with 3 lb ?Bilateral shoulder overhead press 2 x 10 with 2 lb dumbells ?Single bicep curls 2 x 10 with 2 lb dumbells  ?Sit to stand x  10 with 5 lb kb on balance pad ?Standing on balance pad march x 20 ?Standing on balance pad mini squats x 10 ? ?12-21-21 (patient states she has slightly elevated BP today and wants to do light exercises) ?NuStep x 5 min level 4 ?Seated rows with red tband 2  x 10  ?Seated bilateral shoulder ER 2 x 10 with red tband  ?Seated shoulder horizontal abduction 2 x 10 with red tband  ?Shoulder rolls x 10 ?Seated HS curl (red) 2 x 10 each LE ?Seated clamshell (red) 2 x 10 ?Seated knee extension 2 x 10 with 3 lb ?Seated hip flexion (march) x 20 with 3 lb ?Bilateral shoulder overhead press 2 x 10 with 2 lb dumbells ?Single bicep curls 2 x 10 with 2 lb dumbells  ? ? ? ?  ?ASSESSMENT: ?  ?CLINICAL IMPRESSION: ?Pt has met all goals.  She needed little to no guidance or verbal cues on her HEP indicating excellent compliance. She exceeded expectation on all balance tasks and her scores now place her in a low risk fall category.  We were unable to complete the BERG or gait speed tests due to time constraints.  She is very well motivated and will likely continue to improve.  We will DC at this time for patient to continue HEP independently.   ?  ?OBJECTIVE IMPAIRMENTS Abnormal gait, decreased activity tolerance, decreased mobility, and difficulty walking.  ?  ?ACTIVITY LIMITATIONS cleaning and meal prep.  ?  ?PERSONAL FACTORS Age, Fitness, and 1 comorbidity: palliative metastatic cancer  are also affecting patient's functional outcome.  ?  ?  ?REHAB POTENTIAL: Good ?  ?CLINICAL DECISION MAKING: Stable/uncomplicated ?  ?EVALUATION COMPLEXITY: Low ?  ?  ?GOALS: ?Goals reviewed with patient? Yes ?  ?  ?LONG TERM GOALS:  ?  ?LTG Name Target Date Goal status  ?1 Pt will improve berg balance score to at least 48/56 ?Baseline:33, 46 on 12/03/21 12/26/2021 MET and increased  ?2 Pt will improve 5x sit to stand to <30 seconds to demonstrate decreased fall risk ?Baseline:34 seconds 12/26/2021 MET and exceeded  ?3 Pt will complete 66mn walk test in  clinic to demonstrate improved endurance ?Baseline: walks [address] gym 12/26/2021 Able to walk around both gyms x 2 with no fatigue-states she is walking for at least 10 min at home  ?MET  ?4 Pt will b

## 2021-12-31 ENCOUNTER — Other Ambulatory Visit: Payer: Self-pay | Admitting: Medical Oncology

## 2022-01-03 ENCOUNTER — Other Ambulatory Visit (HOSPITAL_COMMUNITY): Payer: Self-pay

## 2022-01-08 ENCOUNTER — Ambulatory Visit: Payer: Medicaid Other | Admitting: Physician Assistant

## 2022-01-08 ENCOUNTER — Other Ambulatory Visit (HOSPITAL_COMMUNITY): Payer: Self-pay

## 2022-01-08 ENCOUNTER — Inpatient Hospital Stay (HOSPITAL_BASED_OUTPATIENT_CLINIC_OR_DEPARTMENT_OTHER): Payer: Medicaid Other | Admitting: Nurse Practitioner

## 2022-01-08 ENCOUNTER — Inpatient Hospital Stay: Payer: Medicaid Other

## 2022-01-08 ENCOUNTER — Encounter: Payer: Self-pay | Admitting: Nurse Practitioner

## 2022-01-08 ENCOUNTER — Inpatient Hospital Stay: Payer: Medicaid Other | Admitting: Dietician

## 2022-01-08 ENCOUNTER — Inpatient Hospital Stay: Payer: Medicaid Other | Attending: Internal Medicine | Admitting: Internal Medicine

## 2022-01-08 ENCOUNTER — Other Ambulatory Visit: Payer: Self-pay

## 2022-01-08 VITALS — BP 139/62 | HR 62 | Temp 97.0°F | Resp 18 | Wt 88.1 lb

## 2022-01-08 DIAGNOSIS — R630 Anorexia: Secondary | ICD-10-CM

## 2022-01-08 DIAGNOSIS — R530 Neoplastic (malignant) related fatigue: Secondary | ICD-10-CM | POA: Diagnosis not present

## 2022-01-08 DIAGNOSIS — C642 Malignant neoplasm of left kidney, except renal pelvis: Secondary | ICD-10-CM

## 2022-01-08 DIAGNOSIS — Z5112 Encounter for antineoplastic immunotherapy: Secondary | ICD-10-CM | POA: Insufficient documentation

## 2022-01-08 DIAGNOSIS — R197 Diarrhea, unspecified: Secondary | ICD-10-CM | POA: Insufficient documentation

## 2022-01-08 DIAGNOSIS — Z515 Encounter for palliative care: Secondary | ICD-10-CM | POA: Diagnosis not present

## 2022-01-08 DIAGNOSIS — Z79899 Other long term (current) drug therapy: Secondary | ICD-10-CM | POA: Insufficient documentation

## 2022-01-08 DIAGNOSIS — R634 Abnormal weight loss: Secondary | ICD-10-CM | POA: Diagnosis not present

## 2022-01-08 LAB — CMP (CANCER CENTER ONLY)
ALT: 10 U/L (ref 0–44)
AST: 18 U/L (ref 15–41)
Albumin: 3.8 g/dL (ref 3.5–5.0)
Alkaline Phosphatase: 67 U/L (ref 38–126)
Anion gap: 9 (ref 5–15)
BUN: 14 mg/dL (ref 8–23)
CO2: 24 mmol/L (ref 22–32)
Calcium: 9 mg/dL (ref 8.9–10.3)
Chloride: 93 mmol/L — ABNORMAL LOW (ref 98–111)
Creatinine: 0.6 mg/dL (ref 0.44–1.00)
GFR, Estimated: >60 mL/min (ref >60)
Glucose, Bld: 111 mg/dL — ABNORMAL HIGH (ref 70–99)
Potassium: 4.3 mmol/L (ref 3.5–5.1)
Sodium: 126 mmol/L — ABNORMAL LOW (ref 135–145)
Total Bilirubin: 0.5 mg/dL (ref 0.3–1.2)
Total Protein: 6.6 g/dL (ref 6.5–8.1)

## 2022-01-08 LAB — CBC WITH DIFFERENTIAL (CANCER CENTER ONLY)
Abs Immature Granulocytes: 0.02 10*3/uL (ref 0.00–0.07)
Basophils Absolute: 0 10*3/uL (ref 0.0–0.1)
Basophils Relative: 0 %
Eosinophils Absolute: 0.1 10*3/uL (ref 0.0–0.5)
Eosinophils Relative: 1 %
HCT: 34 % — ABNORMAL LOW (ref 36.0–46.0)
Hemoglobin: 11.3 g/dL — ABNORMAL LOW (ref 12.0–15.0)
Immature Granulocytes: 0 %
Lymphocytes Relative: 28 %
Lymphs Abs: 2 10*3/uL (ref 0.7–4.0)
MCH: 32.4 pg (ref 26.0–34.0)
MCHC: 33.2 g/dL (ref 30.0–36.0)
MCV: 97.4 fL (ref 80.0–100.0)
Monocytes Absolute: 0.8 10*3/uL (ref 0.1–1.0)
Monocytes Relative: 12 %
Neutro Abs: 4.1 10*3/uL (ref 1.7–7.7)
Neutrophils Relative %: 59 %
Platelet Count: 210 10*3/uL (ref 150–400)
RBC: 3.49 MIL/uL — ABNORMAL LOW (ref 3.87–5.11)
RDW: 13.4 % (ref 11.5–15.5)
WBC Count: 7 10*3/uL (ref 4.0–10.5)
nRBC: 0 % (ref 0.0–0.2)

## 2022-01-08 LAB — TSH: TSH: 2.323 u[IU]/mL (ref 0.350–4.500)

## 2022-01-08 MED ORDER — CELECOXIB 100 MG PO CAPS
100.0000 mg | ORAL_CAPSULE | Freq: Two times a day (BID) | ORAL | 1 refills | Status: DC
Start: 2022-01-08 — End: 2022-01-08

## 2022-01-08 MED ORDER — CELECOXIB 100 MG PO CAPS
100.0000 mg | ORAL_CAPSULE | Freq: Two times a day (BID) | ORAL | 1 refills | Status: DC
  Filled 2022-01-08 – 2022-03-18 (×2): qty 60, 30d supply, fill #0
  Filled 2022-04-16: qty 60, 30d supply, fill #1

## 2022-01-08 MED ORDER — SODIUM CHLORIDE 0.9 % IV SOLN: Freq: Once | INTRAVENOUS | Status: AC

## 2022-01-08 MED ORDER — CABOMETYX 20 MG PO TABS
20.0000 mg | ORAL_TABLET | Freq: Every day | ORAL | 2 refills | Status: DC
  Filled 2022-01-08: qty 30, 30d supply, fill #0
  Filled 2022-01-29: qty 30, 30d supply, fill #1
  Filled 2022-02-22: qty 30, 30d supply, fill #2

## 2022-01-08 MED ORDER — SODIUM CHLORIDE 0.9 % IV SOLN
480.0000 mg | Freq: Once | INTRAVENOUS | Status: AC
  Administered 2022-01-08: 480 mg via INTRAVENOUS
  Filled 2022-01-08: qty 48

## 2022-01-08 NOTE — Patient Instructions (Signed)
Gail Fitzgerald  Discharge Instructions: ?Thank you for choosing [facility] to provide your oncology and hematology care.  ? ?If you have a lab appointment with the [facility], please go directly to the [facility] and check in at the registration area. ?  ?Wear comfortable clothing and clothing appropriate for easy access to any Portacath or PICC line.  ? ?We strive to give you quality time with your provider. You may need to reschedule your appointment if you arrive late (15 or more minutes).  Arriving late affects you and other patients whose appointments are after yours.  Also, if you miss three or more appointments without notifying the office, you may be dismissed from the clinic at the provider?s discretion.    ?  ?For prescription refill requests, have your pharmacy contact our office and allow 72 hours for refills to be completed.   ? ?Today you received the following chemotherapy and/or immunotherapy agent: Opdivo    ?  ?To help prevent nausea and vomiting after your treatment, we encourage you to take your nausea medication as directed. ? ?BELOW ARE SYMPTOMS THAT SHOULD BE REPORTED IMMEDIATELY: ?*FEVER GREATER THAN 100.4 F (38 ?C) OR HIGHER ?*CHILLS OR SWEATING ?*NAUSEA AND VOMITING THAT IS NOT CONTROLLED WITH YOUR NAUSEA MEDICATION ?*UNUSUAL SHORTNESS OF BREATH ?*UNUSUAL BRUISING OR BLEEDING ?*URINARY PROBLEMS (pain or burning when urinating, or frequent urination) ?*BOWEL PROBLEMS (unusual diarrhea, constipation, pain near the anus) ?TENDERNESS IN MOUTH AND THROAT WITH OR WITHOUT PRESENCE OF ULCERS (sore throat, sores in mouth, or a toothache) ?UNUSUAL RASH, SWELLING OR PAIN  ?UNUSUAL VAGINAL DISCHARGE OR ITCHING  ? ?Items with * indicate a potential emergency and should be followed up as soon as possible or go to the Emergency Department if any problems should occur. ? ?Please show the CHEMOTHERAPY ALERT CARD or IMMUNOTHERAPY ALERT CARD at check-in to the  Emergency Department and triage nurse. ? ?Should you have questions after your visit or need to cancel or reschedule your appointment, please contact [facility]  Dept: [phone]  and follow the prompts.  Office hours are 8:00 a.m. to 4:30 p.m. Monday - Friday. Please note that voicemails left after 4:00 p.m. may not be returned until the following business day.  We are closed weekends and major holidays. You have access to a nurse at all times for urgent questions. Please call the main number to the clinic Dept: [phone] and follow the prompts. ? ? ?For any non-urgent questions, you may also contact your provider using MyChart. We now offer e-Visits for anyone 55 and older to request care online for non-urgent symptoms. For details visit [email or web address]. ?  ?Also download the MyChart app! Go to the app store, search "MyChart", open the app, select [facility], and log in with your MyChart username and password. ? ?Due to Covid, a mask is required upon entering the hospital/clinic. If you do not have a mask, one will be given to you upon arrival. For doctor visits, patients may have 1 support person aged 42 or older with them. For treatment visits, patients cannot have anyone with them due to current Covid guidelines and our immunocompromised population.  ? ?Nivolumab injection ???? ???????????? ????? ??? ?????????? ?????????? -- ??? ???????? ?????????????? ???????. ?? ??????????? ??? ??????? ????????? ??????????????? ????????. ? ?? ????? ?????? ??? ??????? ?????, ??? ?????? ? ???, ??????????? ???????, ??? ??????? ? ????????. ???? ????????? ????? ??????????? ? ?????? ?????; ???? ? ??? ????????? ???????, ?????????? ? ???????????? ????????? ??? ??????????. ????????????????? ????????? ???????? (????????):  Opdivo ???? ??? ??????? ?????????? ???????????? ????????? ?? ?????? ?????? ????? ?????????? ??????????? ?????, ???? ?? ? ??? ???? ?? ????????? ?????????: ?????????????  ???????????, ????? ??? ??????? ?????, ???????? ????? ??? ????????; ????????????? ??? ??????????????? ?????????? ?????????????? ????????? ?????? (????????? ????????? ?????? ??????? ????????); ?????????? ??????? ??????? ??????? ? ???????; ??????????????? ???????; ????????????? ??????? ???????, ????? ??? ????????? ?????? ??? ??????? ??????-?????; ?????????? ??? ????????????? ??????? ?? ?????????, ?????? ?????????, ??????? ????????, ????????? ??? ???????????; ????????????? ??? ???????????? ????????????; ?????????? ??????. ???? ??? ??????? ????????? ??? ?????????? ???? ????????? ????????????? ??? ????????????? ????????. ??? ????????? ???????? ? ???????? ??? ???????????. ?????? ??????? ??????? ??? ????? ????????????? ??????????? ?????????? ? ????????? (MedGuide). ?????? ??? ??????? ??????????? ???????? ??? ??????????. ????????? ??????????? ?????????? ? ??????????? ?????????? ????? ????????? ?????. ???????? ?? ?? ??? ?? ???????????? ?????????? ??? ????? ????????? ????? ? 12 ???, ??????? ????????? ???? ????????????????. ??????????????: ???? ??? ???????, ??? ?? ????????? ?????????? ???? ????? ?????????, ?????????? ?????????? ? ????????????????? ????? ??? ???????? ????????? ??? ???????? ?????????? ??????. ??????????: ??? ????????? ????????????? ?????? ??? ???. ?? ???????? ?? ? ??????? ??????. ???? ?????????? ? ?????? ???????? ?????? ?????????? ??? ????????? ????????? ?? ????? ??? ???????? ??????????? ???. ????? ????????? ????????? ?????????. ???? ?? ?? ?????? ?????? ?? ????? ? ??????????? ?????, ???????? ?? ???? ??????????? ??????????. ?? ??? ??? ????????? ????? ???????? ?? ??????????????? ??????????????? ?? ?????????. ????? ???????? ?? ???????? ???? ????????? ??????????????. ???????????? ???????????? ????????? ?????? ???? ??????????? ???? ????????, ????????????? ????????, ?????????????? ?????????? ? ??????? ???????. ????? ???????? ??? ? ???????, ???????????? ??????????? ???????? ??? ??????????. ????????? ???????? ?????  ???????? ?? ?????????????? ? ??????????? ???? ??????????. ??? ??? ????? ???????? ???????? ??? ????????????? ????? ?????????? ??? ????? ?????? ????? ????????? ?? ?????? ??? ?????????? ??????????? ?????. ??? ????? ?????? ????? ????????? ??? ??????? ????????? ??????? ??????? ?????. ?????????? ???????????? ?? ????? ?????? ????? ????????? ? ? ??????? 5 ??????? ????? ??? ???????????. ???????? ??????? ???????? ??????????? ?????????? ? ???????????? ???????????? ??? ????????????? ? ????????? ????????????. ?????????? ??????????? ??????????? ?????????????? ??????????? ????? ????????? ?? ????. ?????????? ? ??????????? ?????????? ?? ????? ????????? ???????????. ?? ????? ?????????? ????? ????????? ? ? ??????? 5 ??????? ????? ??? ??????????? ?????? ??????? ??????? ??????. ?????? ???????? ??????? ????? ???? ??? ?????? ????? ?????????? ????????? ???????, ? ??????? ??????? ??? ????? ?????? ???????? ??????????? ??????????: ?????????????? ??????? -- ?????? ????, ???, ??????????, ???? ????, ???, ????? ??? ?????; ???? ? ???????? ????? ??? ?????? ????????????? ???; ?????????? ??????; ????? ? ?????; ???????; ?????? ??????, ?????? ??? ???????????? ???????; ????? ? ?????. ?????????? ??? ??????????? ????????????; ?????????? ???????????, ?????; ???????? ??????? ?????? ? ????? (?????????????) -- ???????? ????? ??? ?????????? ?????? ?????????? ????, ????????? ???????? ??? ????????????, ?????????? ??????; ???????? ??????? ???????? ?????????? ?????? (???????????) -- ????????? ??? ??????????? ????????????, ???????? ????? ????, ?????????? ????????????? ??? ?????????? ???????????????? ? ?????, ?????? ??? ???????????, ???????, ???????????, ???????????? ????????????? ????? ??? ??????????? ?????????; ?????????? ????? -- ?????????? ?????????? ?????????? ????, ????????? ???????, ?????? ??? ????; ???????????? ?????? -- ???? ? ?????? ??????????, ?????? ????????, ???????, ?????????????? ????, ???? ?????-??????? ??? ??????????? ?????, ?????????? ???? ???  ?????? ????, ????????? ???????? ??? ????????????; ??????? ??????? ??????????? -- ????????? ???????? ??? ????????????, ??????????????, ???????? ????, ???????????? ???????; ??????? ??????? ???????? ?????????? ??????

## 2022-01-08 NOTE — Progress Notes (Signed)
? ?  ?Palliative Medicine ?[facility]  ?Telephone:(336) [phone] [phone]) [phone] ? ? ?Name: Gail Fitzgerald ?Date: 01/08/2022 ?MRN: [id number]  ?DOB: Dec 07, 1934 ? ?Patient Care Team: ?[name], MD as PCP - General (Family Medicine)  ? ? ?REASON FOR CONSULTATION: ?Gail Fitzgerald is a 87 y.o. 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  Now actively receiving Opdivo and Cabometyx.  Palliative ask to see for symptom management and goals of care.  ? ? ?SOCIAL HISTORY:    ? reports that she has never smoked. She has never used smokeless tobacco. She reports that she does not currently use alcohol. She reports that she does not currently use drugs. ? ?ADVANCE DIRECTIVES:  ?Patient does not have advanced directives.  Does not wish to discuss. ? ?CODE STATUS: Full code ? ?PAST MEDICAL HISTORY: ?Past Medical History:  ?Diagnosis Date  ? Hypertension   ? ? ?PAST SURGICAL HISTORY:  ?Past Surgical History:  ?Procedure Laterality Date  ? IR THORACENTESIS ASP PLEURAL SPACE W/IMG GUIDE  04/05/2021  ? THORACENTESIS N/A 04/12/2021  ? Procedure: THORACENTESIS;  Surgeon: [name], DO;  Location: [facility];  Service: Pulmonary;  Laterality: N/A;  ? ? ?HEMATOLOGY/ONCOLOGY HISTORY:  ?Oncology History  ?Renal cell carcinoma, left ([facility])  ?05/09/2021 Initial Diagnosis  ? Renal cell carcinoma, left ([facility]) ?  ?05/22/2021 -  Chemotherapy  ? Patient is on Treatment Plan : Renal cell nivolumab + Ipilimumab q21d   ?   ? ? ?ALLERGIES:  has no active allergies. ? ?MEDICATIONS:  ?Current Outpatient Medications  ?Medication Sig Dispense Refill  ? Aflibercept (EYLEA IO) Inject 1 Dose into the eye every 30 (thirty) days. IO left eye monthly    ? amLODipine (NORVASC) 10 MG tablet Take 10 mg by mouth daily.    ? aspirin EC 81 MG  tablet Take 81 mg by mouth every evening. Swallow whole.    ? benazepril (LOTENSIN) 10 MG tablet Take 10 mg by mouth 2 (two) times daily.    ? benazepril (LOTENSIN) 20 MG tablet Take 20 mg by mouth daily.    ? cabozantinib (CABOMETYX) 20 MG tablet Take 1 tablet (20 mg total) by mouth daily. Take on an empty stomach, 1 hour before or 2 hours after meals. 30 tablet 2  ? celecoxib (CELEBREX) 100 MG capsule Take 1 capsule (100 mg total) by mouth 2 (two) times daily. 60 capsule 1  ? ferrous sulfate 325 (65 FE) MG EC tablet Take 1 tablet (325 mg total) by mouth daily with breakfast. 30 tablet 3  ? ibuprofen (ADVIL) 200 MG tablet Take 400 mg by mouth every 8 (eight) hours as needed (pain.).    ? LORazepam (ATIVAN) 0.5 MG tablet 1 tablet p.o. 30 minutes before the MRI.  May repeat once if needed. 2 tablet 0  ? Magnesium 400 MG CAPS Take 400 mg by mouth See admin instructions. Takes 1 tablet (400 mg) by mouth once daily 10 days on and 10 days off.    ? metoprolol succinate (TOPROL-XL) 100 MG 24 hr tablet Take 50 mg by mouth in the morning and at bedtime.    ? mirtazapine (REMERON) 7.5 MG tablet TAKE 1 TABLET BY MOUTH AT BEDTIME. 90 tablet 1  ? Misc. Devices (WHEELCHAIR) MISC Light weight    ? OVER THE COUNTER MEDICATION Take 1 tablet by mouth daily as  needed (for stomach discomfort). Allochol - (Herbal Supplement) Multivitamin with : Magnesium, potassium, garlic and charcoal. (Russian Medication)    ? oxyCODONE-acetaminophen (PERCOCET/ROXICET) 5-325 MG tablet Take 1 tablet by mouth every 8 (eight) hours as needed for severe pain. 30 tablet 0  ? pantoprazole (PROTONIX) 40 MG tablet TAKE 1 TABLET BY MOUTH EVERY DAY 30 tablet 1  ? prochlorperazine (COMPAZINE) 10 MG tablet Take 1 tablet (10 mg total) by mouth every 6 (six) hours as needed for nausea or vomiting. 30 tablet 2  ? simvastatin (ZOCOR) 20 MG tablet Take 20 mg by mouth every evening.    ? UNABLE TO FIND Take 1 tablet by mouth daily as needed (chest pain (angina)). Med  Name: Validol (Menthyl isovalerate and menthol) (Russian Medication)    ? ?No current facility-administered medications for this visit.  ? ? ?VITAL SIGNS: ?There were no vitals taken for this visit. ?There were no vitals filed for this visit. ?  ?Estimated body mass index is 16.65 kg/m? as calculated from the following: ?  Height as of 11/13/21: 5\' 1"  (1.549 m). ?  Weight as of an earlier encounter on 01/08/22: 88 lb 2 oz (40 kg). ? ?PERFORMANCE STATUS (ECOG) : 2 - Symptomatic, <50% confined to bed ? ?Physical Exam ?General: NAD, ambulatory  ?Cardiovascular: RRR ?Pulmonary:normal breathing pattern  ?Neurological: AAOx4 with a [place] interpreter ? ?IMPRESSION: ? ?Ms. Gail Fitzgerald presents to clinic today for follow-up. Gail Fitzgerald ([place] Interpretor) present. Complains of ongoing diarrhea. Is ambulatory with stand-by assistance. Is scheduled for her next treatment cycle today and also to see Dr. Julien Nordmann.  ? ?Gail Fitzgerald states she had diarrhea for 2-3 days. This had originally improved however seems to have increased again. She is taking Imodium that her son puts in her pill box. We discussed at length how to take the Imodium in the setting of loose stools. She verbalized understanding and interpretor to discuss with family.   ? ?Neoplasm related pain ?Gail Fitzgerald states her pain is improved. Endorses occasional headaches and generalized aching. This is well managed with Tylenol as she has expressed wishes not to take Oxycodone unless she absolutely has to. Is tolerating Celebrex as prescribed. Will plan to closely monitor in collaboration with Oncology team.  ? ?Decreased appetite/weight loss  ?Feels her appetite is improving although she expresses concerns with . She shares that she discontinued use of mirtazapine.  She cannot justify reasoning behind discontinuing and denies any associated reactions or side effects.  Her current weight is stable at 88.8 pounds. 90.2 lbs on 2/13, 97 lbs on 08/28/21, and 114 lbs on 06/12/21.  She  is being followed by the dietitian.  ? ?Ongoing discussions regarding eating some of her favorite foods, frequent snacking, small frequent meals daily versus trying to attempt large meals, and eating at the onset of hunger sensation of possible.  Ms. Gail Fitzgerald shares when she can tolerated she does enjoy fruits and vegetables.  Education provided on possibly adding protein powder to her fruits and making a smoothie.  She does not like the Ensure or boost reporting they are too sweet however is trying to drink them. Is concerned they may be contributing to her loose stools. We discussed identifying foods that she eat that may be causing increased stools afterwards. She is lactose intolerant.  ? ?She also complains of dry mouth more specifically first in the morning and late evening. Education provided on hard candies to increase salivation in addition to the use of Biotene.  ? ?I discussed the  importance of continued conversation with family and their medical providers regarding overall plan of care and treatment options, ensuring decisions are within the context of the patients values and GOCs. ? ?PLAN: ?Tylenol 1000mg  three times daily ?Continue Celebrex 200 mg daily.  Patient reports pain is improved.   ?Biotene gum, mouthwash, toothpaste for dry mouth. ?Imodium for diarrhea  ?Weight and appetite continues to be a challenge. Current weight i Patient has not taken mirtazapine in approximately 2 weeks.  Encouraged her to restart daily at bedtime for appetite stimulation.  Education also provided on increased protein intake with recommendations of adding protein powder to her fruits making a smoothie given she is unable to tolerate Ensure/boost. ?I will plan to see patient back in 3-4 weeks in collaboration to other oncology appointments.  ? ? ?Patient expressed understanding and was in agreement with this plan. She also understands that She can call the clinic at any time with any questions, concerns, or complaints.   ? ?Time Total: 10min  ? ?Visit consisted of counseling and education dealing with the complex and emotionally intense issues of symptom management and palliative care in the setting of serious and potentia

## 2022-01-08 NOTE — Progress Notes (Signed)
?    [facility] ?[phone]   [phone] ? ?OFFICE PROGRESS NOTE ? ?[name], MD ?[address] ?[place] [address] ? ?DIAGNOSIS: Metastatic renal cell carcinoma presented with large left renal mass in addition to significant left hemothorax pleural-based metastasis as well as left hilar and mediastinal lymphadenopathy diagnosed in August 2022. ? ?Biomarker Findings ?Microsatellite status - MS-Stable ?Tumor Mutational Burden - 2 Muts/Mb ?Genomic Findings ?For a complete list of the genes assayed, please refer to the Appendix. ?MTAP loss ?CDKN2A/B CDKN2A loss ?8 Disease relevant genes with no reportable ?Alteratio ? ?PD-L1 expression 0% ? ?PRIOR THERAPY: Systemic immunotherapy with ipilimumab 1 mg/KG in addition to nivolumab 3 mg/KG every 3 weeks for 4 cycles followed by maintenance treatment with nivolumab.  Status post 3 cycles.  First dose started on 05/22/2021.  This treatment was discontinued secondary to disease progression. ? ? ?CURRENT THERAPY: Opdivo 480 Mg IV every 4 weeks with Cabometyx 40 mg p.o. daily.  First dose August 21, 2021.  Status post 4 month of treatment.  Her dose of Cabometyx will be reduced to 20 mg p.o. daily starting 01/08/2022 secondary to persistent diarrhea. ? ?INTERVAL HISTORY: ?Gail Fitzgerald 87 y.o. female returns to the clinic today for follow-up visit accompanied by family member as well as her [place] interpreter.  The patient is feeling fine today with no concerning complaints except for the few episodes of diarrhea up to 3 times a day.  She takes some over-the-counter medication with no improvement.  She does not know if she taken Imodium or not.  She has intermittent left-sided chest pain improved with her pain medication.  She has no nausea, vomiting, abdominal pain or constipation.  She has no shortness of breath, cough or hemoptysis.  She has no significant weight loss.  She is currently on several blood pressure  medications but she has not seen her primary care physician for a while.  The patient is here today for evaluation before starting cycle #5 of her treatment with nivolumab and Cabometyx. ? ?MEDICAL HISTORY: ?Past Medical History:  ?Diagnosis Date  ? Hypertension   ? ? ?ALLERGIES:  has no active allergies. ? ?MEDICATIONS:  ?Current Outpatient Medications  ?Medication Sig Dispense Refill  ? Aflibercept (EYLEA IO) Inject 1 Dose into the eye every 30 (thirty) days. IO left eye monthly    ? amLODipine (NORVASC) 10 MG tablet Take 10 mg by mouth daily.    ? aspirin EC 81 MG tablet Take 81 mg by mouth every evening. Swallow whole.    ? benazepril (LOTENSIN) 10 MG tablet Take 10 mg by mouth 2 (two) times daily.    ? benazepril (LOTENSIN) 20 MG tablet Take 20 mg by mouth daily.    ? cabozantinib (CABOMETYX) 40 MG tablet Take 1 tablet (40 mg total) by mouth daily. Take on an empty stomach, 1 hour before or 2 hours after meals. 30 tablet 2  ? celecoxib (CELEBREX) 100 MG capsule Take 1 capsule (100 mg total) by mouth 2 (two) times daily. 60 capsule 1  ? ferrous sulfate 325 (65 FE) MG EC tablet Take 1 tablet (325 mg total) by mouth daily with breakfast. 30 tablet 3  ? ibuprofen (ADVIL) 200 MG tablet Take 400 mg by mouth every 8 (eight) hours as needed (pain.).    ? LORazepam (ATIVAN) 0.5 MG tablet 1 tablet p.o. 30 minutes before the MRI.  May repeat once if needed. 2 tablet 0  ? Magnesium  400 MG CAPS Take 400 mg by mouth See admin instructions. Takes 1 tablet (400 mg) by mouth once daily 10 days on and 10 days off.    ? metoprolol succinate (TOPROL-XL) 100 MG 24 hr tablet Take 50 mg by mouth in the morning and at bedtime.    ? mirtazapine (REMERON) 7.5 MG tablet TAKE 1 TABLET BY MOUTH AT BEDTIME. 90 tablet 1  ? Misc. Devices (WHEELCHAIR) MISC Light weight    ? OVER THE COUNTER MEDICATION Take 1 tablet by mouth daily as needed (for stomach discomfort). Allochol - (Herbal Supplement) Multivitamin with : Magnesium, potassium, garlic  and charcoal. (Russian Medication)    ? oxyCODONE-acetaminophen (PERCOCET/ROXICET) 5-325 MG tablet Take 1 tablet by mouth every 8 (eight) hours as needed for severe pain. 30 tablet 0  ? pantoprazole (PROTONIX) 40 MG tablet TAKE 1 TABLET BY MOUTH EVERY DAY 30 tablet 1  ? prochlorperazine (COMPAZINE) 10 MG tablet Take 1 tablet (10 mg total) by mouth every 6 (six) hours as needed for nausea or vomiting. 30 tablet 2  ? simvastatin (ZOCOR) 20 MG tablet Take 20 mg by mouth every evening.    ? UNABLE TO FIND Take 1 tablet by mouth daily as needed (chest pain (angina)). Med Name: Validol (Menthyl isovalerate and menthol) (Russian Medication)    ? ?No current facility-administered medications for this visit.  ? ? ?SURGICAL HISTORY:  ?Past Surgical History:  ?Procedure Laterality Date  ? IR THORACENTESIS ASP PLEURAL SPACE W/IMG GUIDE  04/05/2021  ? THORACENTESIS N/A 04/12/2021  ? Procedure: THORACENTESIS;  Surgeon: [name], DO;  Location: [facility];  Service: Pulmonary;  Laterality: N/A;  ? ? ?REVIEW OF SYSTEMS:  Constitutional: positive for fatigue ?Eyes: negative ?Ears, nose, mouth, throat, and face: negative ?Respiratory: positive for pleurisy/chest pain ?Cardiovascular: negative ?Gastrointestinal: positive for diarrhea ?Genitourinary:negative ?Integument/breast: negative ?Hematologic/lymphatic: negative ?Musculoskeletal:negative ?Neurological: negative ?Behavioral/Psych: negative ?Endocrine: negative ?Allergic/Immunologic: negative  ? ?PHYSICAL EXAMINATION: General appearance: alert, cooperative, fatigued, and no distress ?Head: Normocephalic, without obvious abnormality, atraumatic ?Neck: no adenopathy, no JVD, supple, symmetrical, trachea midline, and thyroid not enlarged, symmetric, no tenderness/mass/nodules ?Lymph nodes: Cervical, supraclavicular, and axillary nodes normal. ?Resp: clear to auscultation bilaterally ?Back: symmetric, no curvature. ROM normal. No CVA tenderness. ?Cardio: regular rate and  rhythm, S1, S2 normal, no murmur, click, rub or gallop ?GI: soft, non-tender; bowel sounds normal; no masses,  no organomegaly ?Extremities: extremities normal, atraumatic, no cyanosis or edema ?Neurologic: Alert and oriented X 3, normal strength and tone. Normal symmetric reflexes. Normal coordination and gait ? ?ECOG PERFORMANCE STATUS: 1 - Symptomatic but completely ambulatory ? ?Blood pressure 139/62, pulse 62, temperature (!) 97 ?F (36.1 ?C), temperature source Tympanic, resp. rate 18, weight 88 lb 2 oz (40 kg), SpO2 99 %. ? ?LABORATORY DATA: ?Lab Results  ?Component Value Date  ? WBC 7.0 01/08/2022  ? HGB 11.3 (L) 01/08/2022  ? HCT 34.0 (L) 01/08/2022  ? MCV 97.4 01/08/2022  ? PLT 210 01/08/2022  ? ? ?  Chemistry   ?   ?Component Value Date/Time  ? NA 124 (L) 12/11/2021 1057  ? K 4.3 12/11/2021 1057  ? CL 92 (L) 12/11/2021 1057  ? CO2 23 12/11/2021 1057  ? BUN 14 12/11/2021 1057  ? CREATININE 0.45 12/11/2021 1057  ?    ?Component Value Date/Time  ? CALCIUM 8.7 (L) 12/11/2021 1057  ? ALKPHOS 65 12/11/2021 1057  ? AST 18 12/11/2021 1057  ? ALT 8 12/11/2021 1057  ? BILITOT 0.4 12/11/2021 1057  ?  ? ? ? ?  RADIOGRAPHIC STUDIES: ?No results found. ? ?ASSESSMENT AND PLAN: This is a an 87 years old white female who originally from [place] with metastatic renal cell carcinoma presented with large left renal mass in addition to left hemithorax pleural-based metastasis as well as left hilar and mediastinal lymphadenopathy diagnosed in 2022.  Her molecular studies showed no actionable mutations and PD-L1 expression was negative. ?The patient had MRI of the brain performed recently that showed no evidence of metastatic disease to the brain. ?She started systemic treatment with immunotherapy with ipilimumab 1 mg/KG as well as nivolumab 3 mg/KG every 3 weeks status post 4 cycles.  This treatment was discontinued secondary to disease progression. ?She is currently on treatment with a combination of nivolumab 480 Mg IV every 4  weeks in addition to Cabometyx 40 mg p.o. daily status post 5 cycles. ?The patient has been tolerating this treatment well except for the few episodes of diarrhea. ?I recommended for her to proceed with cy

## 2022-01-10 ENCOUNTER — Other Ambulatory Visit (HOSPITAL_COMMUNITY): Payer: Self-pay

## 2022-01-11 ENCOUNTER — Other Ambulatory Visit: Payer: Self-pay | Admitting: Internal Medicine

## 2022-01-29 ENCOUNTER — Other Ambulatory Visit (HOSPITAL_COMMUNITY): Payer: Self-pay

## 2022-01-31 NOTE — Progress Notes (Signed)
[facility] OFFICE PROGRESS NOTE  Gail Pillar, MD [address] [place]  [id number]  DIAGNOSIS:  Metastatic renal cell carcinoma presented with large left renal mass in addition to significant left hemothorax pleural-based metastasis as well as left hilar and mediastinal lymphadenopathy diagnosed in August 2022.   Biomarker Findings Microsatellite status - MS-Stable Tumor Mutational Burden - 2 Muts/Mb Genomic Findings For a complete list of the genes assayed, please refer to the Appendix. MTAP loss CDKN2A/B CDKN2A loss 8 Disease relevant genes with no reportable Alteratio   PD-L1 expression 0%  PRIOR THERAPY:  Systemic immunotherapy with ipilimumab 1 mg/KG in addition to nivolumab 3 mg/KG every 3 weeks for 4 cycles followed by maintenance treatment with nivolumab.  Status post 3 cycles.  First dose started on 05/22/2021.  This treatment was discontinued secondary to disease progression.  CURRENT THERAPY: Opdivo 480 Mg IV every 4 weeks with Cabometyx 40 mg p.o. daily.  First dose August 21, 2021. Status post 10 cycles.  Cabometryx Dose Reduced to 40 Mg P.O. Daily Starting from May 2nd, 2023 due to intolerance due to diarrhea.  INTERVAL HISTORY: Gail Fitzgerald 87 y.o. female returns to the clinic today for follow-up visit accompanied by her son and her [place] interpreter.  The patient is feeling fairly well today without any concerning complaints. At the patient's last appointment 4 weeks ago with Dr. Julien Nordmann reduced dose of cabometyx due to diarrhea.  She has been tolerating the 20 mg dose better.  She has been able to gain 4 pounds since her last appointment and she only has 1 episode of diarrhea every 3 to 4 days.  She also follows closely with palliative care.  She takes Celebrex, Tylenol, and oxycodone if absolutely needed for her joint pain.  She reports decreased appetite but she self discontinued her Remeron.  Palliative care encouraged that she  restart this.  The patient's son also mentions that she has been more active recently with walking and exercising with the exercises physical therapy had previously taught her.  Otherwise, she denies any fever, chills, or night sweats.  She was previously seen by member the nutritionist team and they are expected to see her while in the infusion room today.  She does not like the taste of boost and Ensure and she is a picky eater.  We previously encouraged her to make her own protein drinks if she does not like what is sold commercially.  She denies any shortness of breath, cough, or hemoptysis.  Sometimes if she lays on her left side or takes a deep breath, she has some radiating chest discomfort around her left lower rib cage.  She is wondering when we are can arrange for follow-up CT scan to assess her metastatic disease to the left hemithorax.  Denies any nausea, vomiting.  Denies any constipation.  She denies any changes with her bladder habits.  She is here today for evaluation and repeat blood work before starting her next cycle of treatment.    MEDICAL HISTORY: Past Medical History:  Diagnosis Date   Hypertension     ALLERGIES:  has no active allergies.  MEDICATIONS:  Current Outpatient Medications  Medication Sig Dispense Refill   Aflibercept (EYLEA IO) Inject 1 Dose into the eye every 30 (thirty) days. IO left eye monthly     amLODipine (NORVASC) 10 MG tablet Take 10 mg by mouth daily.     aspirin EC 81 MG tablet Take 81 mg by mouth every  evening. Swallow whole.     benazepril (LOTENSIN) 20 MG tablet Take 20 mg by mouth daily.     cabozantinib (CABOMETYX) 20 MG tablet Take 1 tablet (20 mg total) by mouth daily. Take on an empty stomach, 1 hour before or 2 hours after meals. 30 tablet 2   celecoxib (CELEBREX) 100 MG capsule Take 1 capsule (100 mg total) by mouth 2 (two) times daily. 60 capsule 1   ferrous sulfate 325 (65 FE) MG EC tablet Take 1 tablet (325 mg total) by mouth daily with  breakfast. 30 tablet 3   ibuprofen (ADVIL) 200 MG tablet Take 400 mg by mouth every 8 (eight) hours as needed (pain.).     LORazepam (ATIVAN) 0.5 MG tablet 1 tablet p.o. 30 minutes before the MRI.  May repeat once if needed. 2 tablet 0   Magnesium 400 MG CAPS Take 400 mg by mouth See admin instructions. Takes 1 tablet (400 mg) by mouth once daily 10 days on and 10 days off.     metoprolol succinate (TOPROL-XL) 100 MG 24 hr tablet Take 50 mg by mouth in the morning and at bedtime.     mirtazapine (REMERON) 7.5 MG tablet TAKE 1 TABLET BY MOUTH AT BEDTIME. 90 tablet 1   Misc. Devices (WHEELCHAIR) MISC Light weight     OVER THE COUNTER MEDICATION Take 1 tablet by mouth daily as needed (for stomach discomfort). Allochol - (Herbal Supplement) Multivitamin with : Magnesium, potassium, garlic and charcoal. (Russian Medication)     oxyCODONE-acetaminophen (PERCOCET/ROXICET) 5-325 MG tablet Take 1 tablet by mouth every 8 (eight) hours as needed for severe pain. 30 tablet 0   pantoprazole (PROTONIX) 40 MG tablet TAKE 1 TABLET BY MOUTH EVERY DAY 30 tablet 1   prochlorperazine (COMPAZINE) 10 MG tablet Take 1 tablet (10 mg total) by mouth every 6 (six) hours as needed for nausea or vomiting. 30 tablet 2   simvastatin (ZOCOR) 20 MG tablet Take 20 mg by mouth every evening.     UNABLE TO FIND Take 1 tablet by mouth daily as needed (chest pain (angina)). Med Name: Validol (Menthyl isovalerate and menthol) (Russian Medication)     No current facility-administered medications for this visit.    SURGICAL HISTORY:  Past Surgical History:  Procedure Laterality Date   IR THORACENTESIS ASP PLEURAL SPACE W/IMG GUIDE  04/05/2021   THORACENTESIS N/A 04/12/2021   Procedure: [facility] Robinsons;  Surgeon: [name], DO;  Location: [facility];  Service: Pulmonary;  Laterality: N/A;    REVIEW OF SYSTEMS:   Review of Systems  Constitutional: Positive for fatigue.  Positive for improved appetite.  Negative for appetite  change, chills, fever and unexpected weight change.  HENT:   Negative for mouth sores, nosebleeds, sore throat and trouble swallowing.   Eyes: Negative for eye problems and icterus.  Respiratory: Negative for cough, hemoptysis, shortness of breath and wheezing.   Cardiovascular: Positive for occasional radiating pain around her left lower rib cage with certain deep breaths or certain positions.  Negative for chest pain and leg swelling.  Gastrointestinal: Improved diarrhea to approximately 1 time every 3 to 4 days.  Negative for abdominal pain, constipation, nausea and vomiting.  Genitourinary: Negative for bladder incontinence, difficulty urinating, dysuria, frequency and hematuria.   Musculoskeletal: Negative for back pain, gait problem, neck pain and neck stiffness.  Skin: Negative for itching and rash.  Neurological: Negative for dizziness, extremity weakness, gait problem, headaches, light-headedness and seizures.  Hematological: Negative for adenopathy. Does not  bruise/bleed easily.  Psychiatric/Behavioral: Negative for confusion, depression and sleep disturbance. The patient is not nervous/anxious.     PHYSICAL EXAMINATION:  Blood pressure (!) 153/64, pulse (!) 58, temperature 97.6 F (36.4 C), temperature source Temporal, resp. rate 16, height _0  (1.549 m), weight 92 lb 8 oz (42 kg), SpO2 97 %.  ECOG PERFORMANCE STATUS: 1  Physical Exam  Constitutional: Oriented to person, place, and time and cachectic appearing female and in no distress.  HENT:  Head: Normocephalic and atraumatic.  Mouth/Throat: Oropharynx is clear and moist. No oropharyngeal exudate.  Eyes: Conjunctivae are normal. Right eye exhibits no discharge. Left eye exhibits no discharge. No scleral icterus.  Neck: Normal range of motion. Neck supple.  Cardiovascular: Normal rate, regular rhythm, normal heart sounds and intact distal pulses.   Pulmonary/Chest: Effort normal and breath sounds normal. No respiratory  distress. No wheezes. No rales.  Abdominal: Soft. Bowel sounds are normal. Exhibits no distension and no mass. There is no tenderness.  Musculoskeletal: Normal range of motion. Exhibits no edema.  Lymphadenopathy:    No cervical adenopathy.  Neurological: Alert and oriented to person, place, and time. Exhibits muscle wasting.  Gait normal. Coordination normal.  Skin: Skin is warm and dry. No rash noted. Not diaphoretic. No erythema. No pallor.  Psychiatric: Mood, memory and judgment normal.  Vitals reviewed.  LABORATORY DATA: Lab Results  Component Value Date   WBC 7.5 02/05/2022   HGB 10.7 (L) 02/05/2022   HCT 32.2 (L) 02/05/2022   MCV 98.5 02/05/2022   PLT 212 02/05/2022      Chemistry      Component Value Date/Time   NA 127 (L) 02/05/2022 0943   K 4.1 02/05/2022 0943   CL 93 (L) 02/05/2022 0943   CO2 26 02/05/2022 0943   BUN 17 02/05/2022 0943   CREATININE 0.57 02/05/2022 0943      Component Value Date/Time   CALCIUM 9.1 02/05/2022 0943   ALKPHOS 78 02/05/2022 0943   AST 14 (L) 02/05/2022 0943   ALT 6 02/05/2022 0943   BILITOT 0.4 02/05/2022 0943       RADIOGRAPHIC STUDIES:  No results found.   ASSESSMENT/PLAN:  This is a very pleasant 87 year old Caucasian female originally from [place].  She has been diagnosed with metastatic renal cell carcinoma.  She presented with a large left renal mass in addition to left hemithorax pleural-based metastasis as well as left hilar mediastinal lymphadenopathy.  She was diagnosed in 2022.  Her molecular studies show no actionable mutations and her PD-L1 expression is negative.  The patient is currently undergoing treatment with immunotherapy with ipilimumab 1 mg/kg as well as nivolumab 3 mg/kg IV every 3 weeks status post 4 cycles treatment was discontinued due to disease progression.   The patient had disease progression after cycle #4 with left-sided pleural tumor with similar size of the left renal mass.  Dr. Julien Nordmann  recommended adding Cabometyx 40 mg p.o. daily with her nivolumab IV every 4 weeks.  She started Cabometyx on 08/21/21.  She is status post 10 cycles.  Dr. Julien Nordmann reduce the dose to 20 mg p.o. daily at her last appointment on 01/08/2022 due to intolerance with diarrhea.   Labs were reviewed.  Recommend that she continue on the same treatment at the same dose of 20 mg of cabometyx.   I will arrange for restaging CT scan the chest, abdomen, pelvis prior to starting her next cycle of treatment.  We will reassess her disease at this time,  especially in the lungs given her intermittent discomfort with deep breaths or positions.  I discussed that if this gets worse we can always arrange for chest x-ray sooner or to push her scan up sooner.  The patient's son mentioned that this only troubles her every once in a while.  We will see her back for follow-up visit in 4 weeks for evaluation and repeat blood work and to review her scan results.  She will continue to follow with palliative care.  She is currently taking Celebrex, Tylenol, and oxycodone if absolutely needed for pain.  She restarted her Remeron for her decreased appetite.  She is scheduled to see a member the nutritionist team while in the infusion room today.  Her nutrition and appetite has improved and she gained 4 pounds since last being seen.  Advised to use Imodium if needed for diarrhea.  Encouraged to continue with her fluid restriction due to her hyponatremia.  Her sodium is stable at 127 today.  The patient was advised to call immediately if she has any concerning symptoms in the interval. The patient voices understanding of current disease status and treatment options and is in agreement with the current care plan. All questions were answered. The patient knows to call the clinic with any problems, questions or concerns. We can certainly see the patient much sooner if necessary       Orders Placed This Encounter  Procedures   CT  CHEST ABDOMEN PELVIS WO CONTRAST    Standing Status:   Future    Standing Expiration Date:   02/06/2023    Order Specific Question:   If indicated for the ordered procedure, I authorize the administration of contrast media per Radiology protocol    Answer:   Yes    Order Specific Question:   Preferred imaging location?    Answer:   [facility]    Order Specific Question:   Release to patient    Answer:   Immediate    Order Specific Question:   Is Oral Contrast requested for this exam?    Answer:   Yes, Per Radiology protocol      The total time spent in the appointment was 20-29 minutes.   Gail Hackleman L Kadyn Guild, PA-C 02/05/22

## 2022-02-01 ENCOUNTER — Other Ambulatory Visit: Payer: Self-pay | Admitting: Physician Assistant

## 2022-02-01 DIAGNOSIS — C642 Malignant neoplasm of left kidney, except renal pelvis: Secondary | ICD-10-CM

## 2022-02-02 ENCOUNTER — Other Ambulatory Visit: Payer: Self-pay | Admitting: Internal Medicine

## 2022-02-05 ENCOUNTER — Encounter: Payer: Self-pay | Admitting: Nurse Practitioner

## 2022-02-05 ENCOUNTER — Other Ambulatory Visit (HOSPITAL_COMMUNITY): Payer: Self-pay

## 2022-02-05 ENCOUNTER — Other Ambulatory Visit: Payer: Self-pay

## 2022-02-05 ENCOUNTER — Inpatient Hospital Stay (HOSPITAL_BASED_OUTPATIENT_CLINIC_OR_DEPARTMENT_OTHER): Payer: Medicaid Other | Admitting: Physician Assistant

## 2022-02-05 ENCOUNTER — Inpatient Hospital Stay (HOSPITAL_BASED_OUTPATIENT_CLINIC_OR_DEPARTMENT_OTHER): Payer: Medicaid Other | Admitting: Nurse Practitioner

## 2022-02-05 ENCOUNTER — Inpatient Hospital Stay: Payer: Medicaid Other

## 2022-02-05 ENCOUNTER — Inpatient Hospital Stay: Payer: Medicaid Other | Admitting: Dietician

## 2022-02-05 VITALS — BP 153/64 | HR 58 | Temp 97.6°F | Resp 16 | Ht 61.0 in | Wt 92.5 lb

## 2022-02-05 DIAGNOSIS — Z5112 Encounter for antineoplastic immunotherapy: Secondary | ICD-10-CM | POA: Diagnosis not present

## 2022-02-05 DIAGNOSIS — Z515 Encounter for palliative care: Secondary | ICD-10-CM

## 2022-02-05 DIAGNOSIS — C642 Malignant neoplasm of left kidney, except renal pelvis: Secondary | ICD-10-CM | POA: Diagnosis not present

## 2022-02-05 DIAGNOSIS — R634 Abnormal weight loss: Secondary | ICD-10-CM

## 2022-02-05 DIAGNOSIS — G893 Neoplasm related pain (acute) (chronic): Secondary | ICD-10-CM | POA: Diagnosis not present

## 2022-02-05 DIAGNOSIS — R630 Anorexia: Secondary | ICD-10-CM

## 2022-02-05 LAB — CMP (CANCER CENTER ONLY)
ALT: 6 U/L (ref 0–44)
AST: 14 U/L — ABNORMAL LOW (ref 15–41)
Albumin: 3.9 g/dL (ref 3.5–5.0)
Alkaline Phosphatase: 78 U/L (ref 38–126)
Anion gap: 8 (ref 5–15)
BUN: 17 mg/dL (ref 8–23)
CO2: 26 mmol/L (ref 22–32)
Calcium: 9.1 mg/dL (ref 8.9–10.3)
Chloride: 93 mmol/L — ABNORMAL LOW (ref 98–111)
Creatinine: 0.57 mg/dL (ref 0.44–1.00)
GFR, Estimated: >60 mL/min (ref >60)
Glucose, Bld: 115 mg/dL — ABNORMAL HIGH (ref 70–99)
Potassium: 4.1 mmol/L (ref 3.5–5.1)
Sodium: 127 mmol/L — ABNORMAL LOW (ref 135–145)
Total Bilirubin: 0.4 mg/dL (ref 0.3–1.2)
Total Protein: 6.6 g/dL (ref 6.5–8.1)

## 2022-02-05 LAB — CBC WITH DIFFERENTIAL (CANCER CENTER ONLY)
Abs Immature Granulocytes: 0.05 10*3/uL (ref 0.00–0.07)
Basophils Absolute: 0 10*3/uL (ref 0.0–0.1)
Basophils Relative: 0 %
Eosinophils Absolute: 0.1 10*3/uL (ref 0.0–0.5)
Eosinophils Relative: 1 %
HCT: 32.2 % — ABNORMAL LOW (ref 36.0–46.0)
Hemoglobin: 10.7 g/dL — ABNORMAL LOW (ref 12.0–15.0)
Immature Granulocytes: 1 %
Lymphocytes Relative: 20 %
Lymphs Abs: 1.5 10*3/uL (ref 0.7–4.0)
MCH: 32.7 pg (ref 26.0–34.0)
MCHC: 33.2 g/dL (ref 30.0–36.0)
MCV: 98.5 fL (ref 80.0–100.0)
Monocytes Absolute: 0.9 10*3/uL (ref 0.1–1.0)
Monocytes Relative: 12 %
Neutro Abs: 5 10*3/uL (ref 1.7–7.7)
Neutrophils Relative %: 66 %
Platelet Count: 212 10*3/uL (ref 150–400)
RBC: 3.27 MIL/uL — ABNORMAL LOW (ref 3.87–5.11)
RDW: 12.4 % (ref 11.5–15.5)
WBC Count: 7.5 10*3/uL (ref 4.0–10.5)
nRBC: 0 % (ref 0.0–0.2)

## 2022-02-05 LAB — TSH: TSH: 2.285 u[IU]/mL (ref 0.350–4.500)

## 2022-02-05 MED ORDER — SODIUM CHLORIDE 0.9 % IV SOLN
480.0000 mg | Freq: Once | INTRAVENOUS | Status: AC
  Administered 2022-02-05: 480 mg via INTRAVENOUS
  Filled 2022-02-05: qty 48

## 2022-02-05 MED ORDER — SODIUM CHLORIDE 0.9 % IV SOLN: Freq: Once | INTRAVENOUS | Status: AC

## 2022-02-05 NOTE — Patient Instructions (Signed)
[facility]  Discharge Instructions: Thank you for choosing [facility] to provide your oncology and hematology care.   If you have a lab appointment with the [facility], please go directly to the [facility] and check in at the registration area.   Wear comfortable clothing and clothing appropriate for easy access to any Portacath or PICC line.   We strive to give you quality time with your provider. You may need to reschedule your appointment if you arrive late (15 or more minutes).  Arriving late affects you and other patients whose appointments are after yours.  Also, if you miss three or more appointments without notifying the office, you may be dismissed from the clinic at the provider's discretion.      For prescription refill requests, have your pharmacy contact our office and allow 72 hours for refills to be completed.    Today you received the following chemotherapy and/or immunotherapy agent: Opdivo      To help prevent nausea and vomiting after your treatment, we encourage you to take your nausea medication as directed.  BELOW ARE SYMPTOMS THAT SHOULD BE REPORTED IMMEDIATELY: *FEVER GREATER THAN 100.4 F (38 C) OR HIGHER *CHILLS OR SWEATING *NAUSEA AND VOMITING THAT IS NOT CONTROLLED WITH YOUR NAUSEA MEDICATION *UNUSUAL SHORTNESS OF BREATH *UNUSUAL BRUISING OR BLEEDING *URINARY PROBLEMS (pain or burning when urinating, or frequent urination) *BOWEL PROBLEMS (unusual diarrhea, constipation, pain near the anus) TENDERNESS IN MOUTH AND THROAT WITH OR WITHOUT PRESENCE OF ULCERS (sore throat, sores in mouth, or a toothache) UNUSUAL RASH, SWELLING OR PAIN  UNUSUAL VAGINAL DISCHARGE OR ITCHING   Items with * indicate a potential emergency and should be followed up as soon as possible or go to the Emergency Department if any problems should occur.  Please show the CHEMOTHERAPY ALERT CARD or IMMUNOTHERAPY ALERT CARD at check-in to the  Emergency Department and triage nurse.  Should you have questions after your visit or need to cancel or reschedule your appointment, please contact [facility]  Dept: [phone]  and follow the prompts.  Office hours are 8:00 a.m. to 4:30 p.m. Monday - Friday. Please note that voicemails left after 4:00 p.m. may not be returned until the following business day.  We are closed weekends and major holidays. You have access to a nurse at all times for urgent questions. Please call the main number to the clinic Dept: [phone] and follow the prompts.   For any non-urgent questions, you may also contact your provider using MyChart. We now offer e-Visits for anyone 30 and older to request care online for non-urgent symptoms. For details visit [email or web address].   Also download the MyChart app! Go to the app store, search "MyChart", open the app, select [facility], and log in with your MyChart username and password.  Due to Covid, a mask is required upon entering the hospital/clinic. If you do not have a mask, one will be given to you upon arrival. For doctor visits, patients may have 1 support person aged 23 or older with them. For treatment visits, patients cannot have anyone with them due to current Covid guidelines and our immunocompromised population.

## 2022-02-05 NOTE — Progress Notes (Signed)
Nutrition Follow-up:  Patient with metastatic renal cell carcinoma. She is currently receiving opdivo q4 weeks with daily carbometyx.   Met with patient in infusion. Interpretor present during visit. Patient reports improved taste. Her appetite has increased and is eating more. Patient has restarted appetite stimulant. Yesterday patient recalls omelette, meat and cheese sandwich, bean soup, salad, pancakes with sour cream. She is not drinking oral nutrition supplements. These make her feel full. Patient denies nutrition impact symptoms.    Medications: reviewed   Labs: Na 127, glucose 115  Anthropometrics: Weight 92 lb 8 oz today increased   5/2 - 88 lb 2 oz 4/4 - 89 lb 5 oz  3/7 - 88 lb 8 oz    NUTRITION DIAGNOSIS: Unintentional weight loss improved    INTERVENTION:  Continue high calorie high protein foods for weight gain  Continue appetite stimulant per MD    MONITORING, EVALUATION, GOAL: weight trends, intake    NEXT VISIT: To be scheduled as needed

## 2022-02-05 NOTE — Progress Notes (Signed)
[facility]  [phone] [phone]   Name: Caran Storck Date: 02/05/2022 MRN: [id number]  DOB: [birth year]  Patient Care Team: [name], MD as PCP - General (Family Medicine)    REASON FOR CONSULTATION: Gail Fitzgerald is a 87 y.o. 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  Now actively receiving Opdivo and Cabometyx.  Palliative ask to see for symptom management and goals of care.    SOCIAL HISTORY:     reports that she has never smoked. She has never used smokeless tobacco. She reports that she does not currently use alcohol. She reports that she does not currently use drugs.  ADVANCE DIRECTIVES:  Patient does not have advanced directives.  Does not wish to discuss.  CODE STATUS: Full code  PAST MEDICAL HISTORY: Past Medical History:  Diagnosis Date   Hypertension     PAST SURGICAL HISTORY:  Past Surgical History:  Procedure Laterality Date   IR THORACENTESIS ASP PLEURAL SPACE W/IMG GUIDE  04/05/2021   THORACENTESIS N/A 04/12/2021   Procedure: Mathews Robinsons;  Surgeon: [name], DO;  Location: [facility];  Service: Pulmonary;  Laterality: N/A;    HEMATOLOGY/ONCOLOGY HISTORY:  Oncology History  Renal cell carcinoma, left ([place])  05/09/2021 Initial Diagnosis   Renal cell carcinoma, left ([place])   05/22/2021 -  Chemotherapy   Patient is on Treatment Plan : Renal cell nivolumab + Ipilimumab q21d        ALLERGIES:  has no active allergies.  MEDICATIONS:  Current Outpatient Medications  Medication Sig Dispense Refill   Aflibercept (EYLEA IO) Inject 1 Dose into the eye every 30 (thirty) days. IO left eye monthly     amLODipine (NORVASC) 10 MG tablet Take 10 mg by mouth daily.     aspirin EC 81 MG  tablet Take 81 mg by mouth every evening. Swallow whole.     benazepril (LOTENSIN) 20 MG tablet Take 20 mg by mouth daily.     cabozantinib (CABOMETYX) 20 MG tablet Take 1 tablet (20 mg total) by mouth daily. Take on an empty stomach, 1 hour before or 2 hours after meals. 30 tablet 2   celecoxib (CELEBREX) 100 MG capsule Take 1 capsule (100 mg total) by mouth 2 (two) times daily. 60 capsule 1   ferrous sulfate 325 (65 FE) MG EC tablet Take 1 tablet (325 mg total) by mouth daily with breakfast. 30 tablet 3   ibuprofen (ADVIL) 200 MG tablet Take 400 mg by mouth every 8 (eight) hours as needed (pain.).     LORazepam (ATIVAN) 0.5 MG tablet 1 tablet p.o. 30 minutes before the MRI.  May repeat once if needed. 2 tablet 0   Magnesium 400 MG CAPS Take 400 mg by mouth See admin instructions. Takes 1 tablet (400 mg) by mouth once daily 10 days on and 10 days off.     metoprolol succinate (TOPROL-XL) 100 MG 24 hr tablet Take 50 mg by mouth in the morning and at bedtime.     mirtazapine (REMERON) 7.5 MG tablet TAKE 1 TABLET BY MOUTH AT BEDTIME. 90 tablet 1   Misc. Devices (WHEELCHAIR) MISC Light weight     OVER THE COUNTER MEDICATION Take 1 tablet by mouth daily as needed (for stomach discomfort). Allochol - (Herbal Supplement) Multivitamin with : Magnesium, potassium, garlic and charcoal. (Russian Medication)  oxyCODONE-acetaminophen (PERCOCET/ROXICET) 5-325 MG tablet Take 1 tablet by mouth every 8 (eight) hours as needed for severe pain. 30 tablet 0   pantoprazole (PROTONIX) 40 MG tablet TAKE 1 TABLET BY MOUTH EVERY DAY 30 tablet 1   prochlorperazine (COMPAZINE) 10 MG tablet Take 1 tablet (10 mg total) by mouth every 6 (six) hours as needed for nausea or vomiting. 30 tablet 2   simvastatin (ZOCOR) 20 MG tablet Take 20 mg by mouth every evening.     UNABLE TO FIND Take 1 tablet by mouth daily as needed (chest pain (angina)). Med Name: Validol (Menthyl isovalerate and menthol) (Russian Medication)     No  current facility-administered medications for this visit.   Facility-Administered Medications Ordered in Other Visits  Medication Dose Route Frequency Provider Last Rate Last Admin   nivolumab (OPDIVO) 480 mg in sodium chloride 0.9 % 100 mL chemo infusion  480 mg Intravenous Once Curt Bears, MD 296 mL/hr at 02/05/22 1240 480 mg at 02/05/22 1240    VITAL SIGNS: There were no vitals taken for this visit. There were no vitals filed for this visit.   Estimated body mass index is 17.48 kg/m as calculated from the following:   Height as of an earlier encounter on 02/05/22: 5\' 1"  (1.549 m).   Weight as of an earlier encounter on 02/05/22: 92 lb 8 oz (42 kg).  PERFORMANCE STATUS (ECOG) : 2 - Symptomatic, <50% confined to bed  Physical Exam General: NAD, in wheelchair  Cardiovascular: RRR Pulmonary:normal breathing pattern  Neurological: AAOx4 with a [place] interpreter  IMPRESSION:  Gail Fitzgerald presents to clinic today for follow-up. Alfina ([place] Interpretor) present. Her son is also present. No acute distress noted. Tolerating infusion treatments.   [name] reports diarrhea is much improved. Has imodium available as needed.   Neoplasm related pain Ms. Matsuo states her pain is improved. Endorses occasional headaches and generalized aching. This is well managed with Tylenol as she has expressed wishes not to take Oxycodone unless she absolutely has to. Is tolerating Celebrex as prescribed. Will plan to closely monitor in collaboration with Oncology team.   Decreased appetite/weight loss  Appetite is much improved. She and son shares appreciation in her improved appetite. Her taste buds seem to be returning which she says is allowing her to enjoy foods again. Her weight has increased to 92 lbs up from 88.8 pounds. 90.2 lbs on 2/13, 97 lbs on 08/28/21, and 114 lbs on 06/12/21.  She is being followed by the dietitian.   Ongoing discussions regarding eating some of her favorite foods,  frequent snacking, small frequent meals daily versus trying to attempt large meals, and eating at the onset of hunger sensation of possible. Her son states he has been trying to focus on the small meals as this seems to be more successful her. She enjoys fruits, vegetables, and pastries.  Education provided on possibly adding protein powder to her fruits and making a smoothie.  She does not like the Ensure or boost reporting they are too sweet however is trying to drink them. Is concerned they may be contributing to her loose stools. We discussed identifying foods that she eat that may be causing increased stools afterwards. She is lactose intolerant.   She also complains of dry mouth more specifically first in the morning and late evening. Education provided on hard candies to increase salivation in addition to the use of Biotene. This has improved.   I discussed the importance of continued conversation with family and  their medical providers regarding overall plan of care and treatment options, ensuring decisions are within the context of the patients values and GOCs.  PLAN: Tylenol 1000mg  three times daily Continue Celebrex 200 mg daily.  Patient reports pain is improved.   Biotene gum, mouthwash, toothpaste for dry mouth. Imodium for diarrhea  Weight has increased and appetite is much improved.  Education also provided on increased protein intake with recommendations of adding protein powder to her fruits making a smoothie given she is unable to tolerate Ensure/boost. I will plan to see patient back in 3-4 weeks in collaboration to other oncology appointments.    Patient expressed understanding and was in agreement with this plan. She also understands that She can call the clinic at any time with any questions, concerns, or complaints.   Number and complexity of problems addressed: 2 HIGH - 1 or more chronic illnesses with SEVERE exacerbation, progression, or side effects of treatment - advanced  cancer, pain. Any controlled substances utilized were prescribed in the context of palliative care.   Time Total: 20 min   Visit consisted of counseling and education dealing with the complex and emotionally intense issues of symptom management and palliative care in the setting of serious and potentially life-threatening illness.Greater than 50%  of this time was spent counseling and coordinating care related to the above assessment and plan.  Alda Lea, AGPCNP-BC  Palliative Medicine Team/[facility] [place]

## 2022-02-09 ENCOUNTER — Other Ambulatory Visit: Payer: Self-pay | Admitting: Internal Medicine

## 2022-02-22 ENCOUNTER — Other Ambulatory Visit (HOSPITAL_COMMUNITY): Payer: Self-pay

## 2022-02-28 ENCOUNTER — Ambulatory Visit (HOSPITAL_COMMUNITY)
Admission: RE | Admit: 2022-02-28 | Discharge: 2022-02-28 | Disposition: A | Discharge status: Home / Self Care | Payer: Medicaid Other | Source: Ambulatory Visit | Attending: Physician Assistant | Admitting: Physician Assistant

## 2022-02-28 DIAGNOSIS — C642 Malignant neoplasm of left kidney, except renal pelvis: Secondary | ICD-10-CM

## 2022-03-01 ENCOUNTER — Other Ambulatory Visit: Payer: Self-pay | Admitting: Internal Medicine

## 2022-03-04 ENCOUNTER — Other Ambulatory Visit (HOSPITAL_COMMUNITY): Payer: Self-pay

## 2022-03-05 ENCOUNTER — Other Ambulatory Visit: Payer: Self-pay

## 2022-03-05 ENCOUNTER — Encounter: Payer: Self-pay | Admitting: Internal Medicine

## 2022-03-05 ENCOUNTER — Inpatient Hospital Stay: Payer: Medicaid Other

## 2022-03-05 ENCOUNTER — Encounter: Payer: Self-pay | Admitting: Nurse Practitioner

## 2022-03-05 ENCOUNTER — Inpatient Hospital Stay (HOSPITAL_BASED_OUTPATIENT_CLINIC_OR_DEPARTMENT_OTHER): Payer: Medicaid Other | Admitting: Nurse Practitioner

## 2022-03-05 ENCOUNTER — Inpatient Hospital Stay: Payer: Medicaid Other | Attending: Internal Medicine | Admitting: Internal Medicine

## 2022-03-05 VITALS — BP 145/85 | HR 54 | Temp 98.0°F | Resp 16 | Wt 94.0 lb

## 2022-03-05 DIAGNOSIS — R630 Anorexia: Secondary | ICD-10-CM | POA: Diagnosis not present

## 2022-03-05 DIAGNOSIS — C771 Secondary and unspecified malignant neoplasm of intrathoracic lymph nodes: Secondary | ICD-10-CM | POA: Diagnosis not present

## 2022-03-05 DIAGNOSIS — Z5112 Encounter for antineoplastic immunotherapy: Secondary | ICD-10-CM | POA: Diagnosis present

## 2022-03-05 DIAGNOSIS — C642 Malignant neoplasm of left kidney, except renal pelvis: Secondary | ICD-10-CM

## 2022-03-05 DIAGNOSIS — R634 Abnormal weight loss: Secondary | ICD-10-CM | POA: Diagnosis not present

## 2022-03-05 DIAGNOSIS — Z79899 Other long term (current) drug therapy: Secondary | ICD-10-CM | POA: Diagnosis not present

## 2022-03-05 DIAGNOSIS — G893 Neoplasm related pain (acute) (chronic): Secondary | ICD-10-CM | POA: Diagnosis not present

## 2022-03-05 DIAGNOSIS — Z515 Encounter for palliative care: Secondary | ICD-10-CM

## 2022-03-05 LAB — CMP (CANCER CENTER ONLY)
ALT: 7 U/L (ref 0–44)
AST: 16 U/L (ref 15–41)
Albumin: 3.9 g/dL (ref 3.5–5.0)
Alkaline Phosphatase: 83 U/L (ref 38–126)
Anion gap: 9 (ref 5–15)
BUN: 15 mg/dL (ref 8–23)
CO2: 24 mmol/L (ref 22–32)
Calcium: 9 mg/dL (ref 8.9–10.3)
Chloride: 94 mmol/L — ABNORMAL LOW (ref 98–111)
Creatinine: 0.64 mg/dL (ref 0.44–1.00)
GFR, Estimated: >60 mL/min (ref >60)
Glucose, Bld: 108 mg/dL — ABNORMAL HIGH (ref 70–99)
Potassium: 4.6 mmol/L (ref 3.5–5.1)
Sodium: 127 mmol/L — ABNORMAL LOW (ref 135–145)
Total Bilirubin: 0.4 mg/dL (ref 0.3–1.2)
Total Protein: 6.5 g/dL (ref 6.5–8.1)

## 2022-03-05 LAB — CBC WITH DIFFERENTIAL (CANCER CENTER ONLY)
Abs Immature Granulocytes: 0.05 10*3/uL (ref 0.00–0.07)
Basophils Absolute: 0.1 10*3/uL (ref 0.0–0.1)
Basophils Relative: 1 %
Eosinophils Absolute: 0.1 10*3/uL (ref 0.0–0.5)
Eosinophils Relative: 1 %
HCT: 35.5 % — ABNORMAL LOW (ref 36.0–46.0)
Hemoglobin: 12 g/dL (ref 12.0–15.0)
Immature Granulocytes: 1 %
Lymphocytes Relative: 17 %
Lymphs Abs: 1.6 10*3/uL (ref 0.7–4.0)
MCH: 32.3 pg (ref 26.0–34.0)
MCHC: 33.8 g/dL (ref 30.0–36.0)
MCV: 95.7 fL (ref 80.0–100.0)
Monocytes Absolute: 1.2 10*3/uL — ABNORMAL HIGH (ref 0.1–1.0)
Monocytes Relative: 13 %
Neutro Abs: 6.3 10*3/uL (ref 1.7–7.7)
Neutrophils Relative %: 67 %
Platelet Count: 208 10*3/uL (ref 150–400)
RBC: 3.71 MIL/uL — ABNORMAL LOW (ref 3.87–5.11)
RDW: 12.3 % (ref 11.5–15.5)
WBC Count: 9.3 10*3/uL (ref 4.0–10.5)
nRBC: 0 % (ref 0.0–0.2)

## 2022-03-05 LAB — TSH: TSH: 2.087 u[IU]/mL (ref 0.350–4.500)

## 2022-03-05 MED ORDER — SODIUM CHLORIDE 0.9 % IV SOLN: Freq: Once | INTRAVENOUS | Status: AC

## 2022-03-05 MED ORDER — SODIUM CHLORIDE 0.9 % IV SOLN
480.0000 mg | Freq: Once | INTRAVENOUS | Status: AC
  Administered 2022-03-05: 480 mg via INTRAVENOUS
  Filled 2022-03-05: qty 48

## 2022-03-05 NOTE — Progress Notes (Signed)
[facility] Telephone:(336) [phone]   Fax:(336) [phone]  OFFICE PROGRESS NOTE  [name], MD [address] [place] [address]  DIAGNOSIS: Metastatic renal cell carcinoma presented with large left renal mass in addition to significant left hemothorax pleural-based metastasis as well as left hilar and mediastinal lymphadenopathy diagnosed in August 2022.  Biomarker Findings Microsatellite status - MS-Stable Tumor Mutational Burden - 2 Muts/Mb Genomic Findings For a complete list of the genes assayed, please refer to the Appendix. MTAP loss CDKN2A/B CDKN2A loss 8 Disease relevant genes with no reportable Alteratio  PD-L1 expression 0%  PRIOR THERAPY: Systemic immunotherapy with ipilimumab 1 mg/KG in addition to nivolumab 3 mg/KG every 3 weeks for 4 cycles followed by maintenance treatment with nivolumab.  Status post 3 cycles.  First dose started on 05/22/2021.  This treatment was discontinued secondary to disease progression.   CURRENT THERAPY: Opdivo 480 Mg IV every 4 weeks with Cabometyx 40 mg p.o. daily.  First dose August 21, 2021.  Status post 7 month of treatment.  Her dose of Cabometyx will be reduced to 20 mg p.o. daily starting 01/08/2022 secondary to persistent diarrhea.  INTERVAL HISTORY: Gail Fitzgerald 87 y.o. female returns to the clinic today for follow-up visit accompanied by her daughter-in-law and her [place] interpreter, [place].  The patient is feeling fine today with no concerning complaints except for some sleep disturbance and occasional headache.  She continues to have mild pain on the left side of the chest.  She denied having any current shortness of breath but has mild cough with no hemoptysis.  She has no nausea, vomiting, diarrhea or constipation.  She has no headache or visual changes.  She denied having any recent weight loss or night sweats.  She has been tolerating her treatment with nivolumab and Cabometyx fairly  well.  She is here today for evaluation with repeat CT scan of the chest, abdomen and pelvis for restaging of her disease.   MEDICAL HISTORY: Past Medical History:  Diagnosis Date   Hypertension     ALLERGIES:  has no active allergies.  MEDICATIONS:  Current Outpatient Medications  Medication Sig Dispense Refill   Aflibercept (EYLEA IO) Inject 1 Dose into the eye every 30 (thirty) days. IO left eye monthly     amLODipine (NORVASC) 10 MG tablet Take 10 mg by mouth daily.     aspirin EC 81 MG tablet Take 81 mg by mouth every evening. Swallow whole.     benazepril (LOTENSIN) 20 MG tablet Take 20 mg by mouth daily.     cabozantinib (CABOMETYX) 20 MG tablet Take 1 tablet (20 mg total) by mouth daily. Take on an empty stomach, 1 hour before or 2 hours after meals. 30 tablet 2   celecoxib (CELEBREX) 100 MG capsule Take 1 capsule (100 mg total) by mouth 2 (two) times daily. 60 capsule 1   ferrous sulfate 325 (65 FE) MG EC tablet TAKE 1 TABLET BY MOUTH EVERY DAY WITH BREAKFAST 90 tablet 1   ibuprofen (ADVIL) 200 MG tablet Take 400 mg by mouth every 8 (eight) hours as needed (pain.).     LORazepam (ATIVAN) 0.5 MG tablet 1 tablet p.o. 30 minutes before the MRI.  May repeat once if needed. 2 tablet 0   Magnesium 400 MG CAPS Take 400 mg by mouth See admin instructions. Takes 1 tablet (400 mg) by mouth once daily 10 days on and 10 days off.  metoprolol succinate (TOPROL-XL) 100 MG 24 hr tablet Take 50 mg by mouth in the morning and at bedtime.     mirtazapine (REMERON) 7.5 MG tablet TAKE 1 TABLET BY MOUTH AT BEDTIME. 90 tablet 1   Misc. Devices (WHEELCHAIR) MISC Light weight     OVER THE COUNTER MEDICATION Take 1 tablet by mouth daily as needed (for stomach discomfort). Allochol - (Herbal Supplement) Multivitamin with : Magnesium, potassium, garlic and charcoal. (Russian Medication)     oxyCODONE-acetaminophen (PERCOCET/ROXICET) 5-325 MG tablet Take 1 tablet by mouth every 8 (eight) hours as needed  for severe pain. 30 tablet 0   pantoprazole (PROTONIX) 40 MG tablet TAKE 1 TABLET BY MOUTH EVERY DAY 30 tablet 1   prochlorperazine (COMPAZINE) 10 MG tablet Take 1 tablet (10 mg total) by mouth every 6 (six) hours as needed for nausea or vomiting. 30 tablet 2   simvastatin (ZOCOR) 20 MG tablet Take 20 mg by mouth every evening.     UNABLE TO FIND Take 1 tablet by mouth daily as needed (chest pain (angina)). Med Name: Validol (Menthyl isovalerate and menthol) (Russian Medication)     No current facility-administered medications for this visit.    SURGICAL HISTORY:  Past Surgical History:  Procedure Laterality Date   IR THORACENTESIS ASP PLEURAL SPACE W/IMG GUIDE  04/05/2021   THORACENTESIS N/A 04/12/2021   Procedure: Alanson Puls;  Surgeon: Josephine Igo, DO;  Location: [facility];  Service: Pulmonary;  Laterality: N/A;    REVIEW OF SYSTEMS:  Constitutional: positive for fatigue Eyes: negative Ears, nose, mouth, throat, and face: negative Respiratory: positive for cough and pleurisy/chest pain Cardiovascular: negative Gastrointestinal: negative Genitourinary:negative Integument/breast: negative Hematologic/lymphatic: negative Musculoskeletal:negative Neurological: negative Behavioral/Psych: positive for sleep disturbance Endocrine: negative Allergic/Immunologic: negative   PHYSICAL EXAMINATION: General appearance: alert, cooperative, fatigued, and no distress Head: Normocephalic, without obvious abnormality, atraumatic Neck: no adenopathy, no JVD, supple, symmetrical, trachea midline, and thyroid not enlarged, symmetric, no tenderness/mass/nodules Lymph nodes: Cervical, supraclavicular, and axillary nodes normal. Resp: diminished breath sounds LLL and dullness to percussion LLL Back: symmetric, no curvature. ROM normal. No CVA tenderness. Cardio: regular rate and rhythm, S1, S2 normal, no murmur, click, rub or gallop GI: soft, non-tender; bowel sounds normal; no masses,  no  organomegaly Extremities: extremities normal, atraumatic, no cyanosis or edema Neurologic: Alert and oriented X 3, normal strength and tone. Normal symmetric reflexes. Normal coordination and gait  ECOG PERFORMANCE STATUS: 1 - Symptomatic but completely ambulatory  Blood pressure (!) 145/85, pulse (!) 54, temperature 98 F (36.7 C), temperature source Oral, resp. rate 16, weight 94 lb (42.6 kg), SpO2 100 %.  LABORATORY DATA: Lab Results  Component Value Date   WBC 9.3 03/05/2022   HGB 12.0 03/05/2022   HCT 35.5 (L) 03/05/2022   MCV 95.7 03/05/2022   PLT 208 03/05/2022      Chemistry      Component Value Date/Time   NA 127 (L) 03/05/2022 0912   K 4.6 03/05/2022 0912   CL 94 (L) 03/05/2022 0912   CO2 24 03/05/2022 0912   BUN 15 03/05/2022 0912   CREATININE 0.64 03/05/2022 0912      Component Value Date/Time   CALCIUM 9.0 03/05/2022 0912   ALKPHOS 83 03/05/2022 0912   AST 16 03/05/2022 0912   ALT 7 03/05/2022 0912   BILITOT 0.4 03/05/2022 0912       RADIOGRAPHIC STUDIES: CT CHEST ABDOMEN PELVIS WO CONTRAST  Result Date: 03/01/2022 CLINICAL DATA:  Renal cell carcinoma on the  left. Restaging. * Tracking Code: BO * EXAM: CT CHEST, ABDOMEN AND PELVIS WITHOUT CONTRAST TECHNIQUE: Multidetector CT imaging of the chest, abdomen and pelvis was performed following the standard protocol without IV contrast. RADIATION DOSE REDUCTION: This exam was performed according to the departmental dose-optimization program which includes automated exposure control, adjustment of the mA and/or kV according to patient size and/or use of iterative reconstruction technique. COMPARISON:  12/04/2021 FINDINGS: CT CHEST FINDINGS Cardiovascular: The heart size is normal. No substantial pericardial effusion. Coronary artery calcification is evident. Advanced atherosclerotic calcification is noted in the wall of the thoracic aorta. Ascending thoracic aorta measures 4.3 cm diameter. Mediastinum/Nodes: Soft  tissue nodularity in the left mediastinum and AP window similar to prior. No evidence for gross hilar lymphadenopathy although assessment is limited by the lack of intravenous contrast on the current study. The esophagus has normal imaging features. There is no axillary lymphadenopathy. Lungs/Pleura: The irregular nodular pleural and fissural thickening in the left hemithorax is similar mildly progressed since prior. Measuring in the medial left upper lobe at a similar location as on the prior study, this measures 3 cm in thickness today (image 12/series 2) compared to 2.7 cm previously. Disease in the posteromedial left hemithorax measured previously at 3.5 cm thickness is now 3.7 cm (46/2). 15 mm left fissural nodule on 78/4 was 14 mm previously (remeasured). 7.3 x 4.5 cm masslike soft tissue collection in the posterior left costophrenic sulcus is similar to prior. Nodularity in the posterior right upper lobe on image 22/4 is stable. No new suspicious pulmonary nodule or mass in the right lung. Musculoskeletal: No worrisome lytic or sclerotic osseous abnormality. CT ABDOMEN PELVIS FINDINGS Hepatobiliary: No suspicious focal abnormality in the liver on this study without intravenous contrast. Calcified gallstones evident. No intrahepatic or extrahepatic biliary dilation. Pancreas: No focal mass lesion. No dilatation of the main duct. No intraparenchymal cyst. No peripancreatic edema. Spleen: No splenomegaly. No focal mass lesion. Adrenals/Urinary Tract: No adrenal nodule or mass. Right kidney unremarkable. Multiple left hepatic cysts are again noted. Index exophytic mixed attenuation lesion lower pole left kidney measures slightly smaller today at 3.1 by 2.3 cm compared to 3.4 x 2.9 cm previously. No evidence for hydroureter. The urinary bladder appears normal for the degree of distention. Stomach/Bowel: Stomach is unremarkable. No gastric wall thickening. No evidence of outlet obstruction. Duodenum is normally  positioned as is the ligament of Treitz. No small bowel wall thickening. No small bowel dilatation. The terminal ileum is normal. The appendix is not well visualized, but there is no edema or inflammation in the region of the cecum. No gross colonic mass. No colonic wall thickening. Diverticular changes are noted in the left colon without evidence of diverticulitis. Vascular/Lymphatic: There is moderate atherosclerotic calcification of the abdominal aorta without aneurysm. There is no gastrohepatic or hepatoduodenal ligament lymphadenopathy. No retroperitoneal or mesenteric lymphadenopathy. No pelvic sidewall lymphadenopathy. Reproductive: Unremarkable. Other: No intraperitoneal free fluid. Musculoskeletal: Bones are diffusely demineralized. Tiny sclerotic focus in the right iliac bone is stable. Stable appearance L1 compression deformity. IMPRESSION: 1. Mild interval progression of the irregular nodular pleural and fissural thickening in the left hemithorax. 2. Stable appearance of masslike soft tissue collection in the posterior left costophrenic sulcus. 3. Slight interval decrease in size of the index exophytic mixed attenuation lesion lower pole left kidney. 4. Cholelithiasis. 5. Left colonic diverticulosis without diverticulitis. 6. Aortic Atherosclerosis (ICD10-I70.0). Electronically Signed   By: Kennith Center M.D.   On: 03/01/2022 10:41    ASSESSMENT  AND PLAN: This is a an 87 years old white female who originally from [place] with metastatic renal cell carcinoma presented with large left renal mass in addition to left hemithorax pleural-based metastasis as well as left hilar and mediastinal lymphadenopathy diagnosed in 2022.  Her molecular studies showed no actionable mutations and PD-L1 expression was negative. The patient had MRI of the brain performed recently that showed no evidence of metastatic disease to the brain. She started systemic treatment with immunotherapy with ipilimumab 1 mg/KG as well as  nivolumab 3 mg/KG every 3 weeks status post 4 cycles.  This treatment was discontinued secondary to disease progression. She is currently on treatment with a combination of nivolumab 480 Mg IV every 4 weeks in addition to Cabometyx 40 mg p.o. daily status post 7 cycles. Her dose of Cabometyx was reduced to 20 mg p.o. daily starting cycle #6 secondary to drug-induced diarrhea. The patient has been tolerating this treatment much better. She had repeat CT scan of the chest, abdomen and pelvis performed recently.  I personally and independently reviewed the scan images and discussed the result and showed the images to the patient and her family. Her scan showed very mild increase in the irregular nodular pleural and fissural thickening in the left hemothorax otherwise stable disease. I recommended for the patient to continue her current treatment with nivolumab and Cabometyx as planned and she will proceed with cycle #8 today. She will come back for follow-up visit in 4 weeks for evaluation before the next cycle of her treatment. For the anemia her hemoglobin and hematocrit are better.  She was advised to stay on the ferrous sulfate. For the hyponatremia she was advised with fluid restriction and we will continue to monitor it closely. The patient was advised to call immediately if she has any other concerning symptoms in the interval.  The patient voices understanding of current disease status and treatment options and is in agreement with the current care plan. The total time spent in the appointment was 30 minutes.  All questions were answered. The patient knows to call the clinic with any problems, questions or concerns. We can certainly see the patient much sooner if necessary.   Disclaimer: This note was dictated with voice recognition software. Similar sounding words can inadvertently be transcribed and may not be corrected upon review.

## 2022-03-10 ENCOUNTER — Other Ambulatory Visit: Payer: Self-pay | Admitting: Physician Assistant

## 2022-03-10 DIAGNOSIS — R630 Anorexia: Secondary | ICD-10-CM

## 2022-03-18 ENCOUNTER — Other Ambulatory Visit: Payer: Self-pay | Admitting: Internal Medicine

## 2022-03-19 ENCOUNTER — Other Ambulatory Visit (HOSPITAL_COMMUNITY): Payer: Self-pay

## 2022-03-20 ENCOUNTER — Other Ambulatory Visit (HOSPITAL_COMMUNITY): Payer: Self-pay

## 2022-03-26 ENCOUNTER — Other Ambulatory Visit (HOSPITAL_COMMUNITY): Payer: Self-pay

## 2022-03-28 ENCOUNTER — Other Ambulatory Visit (HOSPITAL_COMMUNITY): Payer: Self-pay

## 2022-03-28 ENCOUNTER — Other Ambulatory Visit: Payer: Self-pay | Admitting: Internal Medicine

## 2022-03-28 NOTE — Telephone Encounter (Signed)
Received pharmacy request for Pantoprazole. Request approved.

## 2022-03-29 ENCOUNTER — Other Ambulatory Visit: Payer: Self-pay | Admitting: Internal Medicine

## 2022-03-29 ENCOUNTER — Other Ambulatory Visit (HOSPITAL_COMMUNITY): Payer: Self-pay

## 2022-03-29 MED ORDER — CABOMETYX 20 MG PO TABS
20.0000 mg | ORAL_TABLET | Freq: Every day | ORAL | 2 refills | Status: DC
  Filled 2022-03-29: qty 30, 30d supply, fill #0
  Filled 2022-05-01: qty 30, 30d supply, fill #1
  Filled 2022-05-31: qty 30, 30d supply, fill #2

## 2022-04-02 ENCOUNTER — Other Ambulatory Visit: Payer: Self-pay

## 2022-04-02 ENCOUNTER — Encounter: Payer: Self-pay | Admitting: Nurse Practitioner

## 2022-04-02 ENCOUNTER — Inpatient Hospital Stay: Payer: Medicaid Other

## 2022-04-02 ENCOUNTER — Other Ambulatory Visit (HOSPITAL_COMMUNITY): Payer: Self-pay

## 2022-04-02 ENCOUNTER — Inpatient Hospital Stay (HOSPITAL_BASED_OUTPATIENT_CLINIC_OR_DEPARTMENT_OTHER): Payer: Medicaid Other | Admitting: Nurse Practitioner

## 2022-04-02 ENCOUNTER — Inpatient Hospital Stay (HOSPITAL_BASED_OUTPATIENT_CLINIC_OR_DEPARTMENT_OTHER): Payer: Medicaid Other | Admitting: Internal Medicine

## 2022-04-02 ENCOUNTER — Inpatient Hospital Stay: Payer: Medicaid Other | Admitting: Dietician

## 2022-04-02 ENCOUNTER — Encounter: Payer: Self-pay | Admitting: Internal Medicine

## 2022-04-02 ENCOUNTER — Inpatient Hospital Stay: Payer: Medicaid Other | Attending: Internal Medicine

## 2022-04-02 VITALS — BP 170/71 | HR 61 | Temp 97.7°F | Resp 15 | Wt 95.2 lb

## 2022-04-02 DIAGNOSIS — C782 Secondary malignant neoplasm of pleura: Secondary | ICD-10-CM | POA: Diagnosis not present

## 2022-04-02 DIAGNOSIS — R630 Anorexia: Secondary | ICD-10-CM

## 2022-04-02 DIAGNOSIS — Z5112 Encounter for antineoplastic immunotherapy: Secondary | ICD-10-CM | POA: Diagnosis not present

## 2022-04-02 DIAGNOSIS — C642 Malignant neoplasm of left kidney, except renal pelvis: Secondary | ICD-10-CM | POA: Diagnosis not present

## 2022-04-02 DIAGNOSIS — R530 Neoplastic (malignant) related fatigue: Secondary | ICD-10-CM

## 2022-04-02 DIAGNOSIS — Z79899 Other long term (current) drug therapy: Secondary | ICD-10-CM | POA: Diagnosis not present

## 2022-04-02 DIAGNOSIS — Z515 Encounter for palliative care: Secondary | ICD-10-CM | POA: Diagnosis not present

## 2022-04-02 LAB — CBC WITH DIFFERENTIAL (CANCER CENTER ONLY)
Abs Immature Granulocytes: 0.03 10*3/uL (ref 0.00–0.07)
Basophils Absolute: 0 10*3/uL (ref 0.0–0.1)
Basophils Relative: 0 %
Eosinophils Absolute: 0.1 10*3/uL (ref 0.0–0.5)
Eosinophils Relative: 1 %
HCT: 33.9 % — ABNORMAL LOW (ref 36.0–46.0)
Hemoglobin: 11.1 g/dL — ABNORMAL LOW (ref 12.0–15.0)
Immature Granulocytes: 0 %
Lymphocytes Relative: 16 %
Lymphs Abs: 1.5 10*3/uL (ref 0.7–4.0)
MCH: 31.3 pg (ref 26.0–34.0)
MCHC: 32.7 g/dL (ref 30.0–36.0)
MCV: 95.5 fL (ref 80.0–100.0)
Monocytes Absolute: 0.9 10*3/uL (ref 0.1–1.0)
Monocytes Relative: 9 %
Neutro Abs: 7 10*3/uL (ref 1.7–7.7)
Neutrophils Relative %: 74 %
Platelet Count: 218 10*3/uL (ref 150–400)
RBC: 3.55 MIL/uL — ABNORMAL LOW (ref 3.87–5.11)
RDW: 13.2 % (ref 11.5–15.5)
WBC Count: 9.5 10*3/uL (ref 4.0–10.5)
nRBC: 0 % (ref 0.0–0.2)

## 2022-04-02 LAB — CMP (CANCER CENTER ONLY)
ALT: 9 U/L (ref 0–44)
AST: 19 U/L (ref 15–41)
Albumin: 3.9 g/dL (ref 3.5–5.0)
Alkaline Phosphatase: 92 U/L (ref 38–126)
Anion gap: 8 (ref 5–15)
BUN: 16 mg/dL (ref 8–23)
CO2: 24 mmol/L (ref 22–32)
Calcium: 8.6 mg/dL — ABNORMAL LOW (ref 8.9–10.3)
Chloride: 97 mmol/L — ABNORMAL LOW (ref 98–111)
Creatinine: 0.59 mg/dL (ref 0.44–1.00)
GFR, Estimated: >60 mL/min (ref >60)
Glucose, Bld: 118 mg/dL — ABNORMAL HIGH (ref 70–99)
Potassium: 4.7 mmol/L (ref 3.5–5.1)
Sodium: 129 mmol/L — ABNORMAL LOW (ref 135–145)
Total Bilirubin: 0.4 mg/dL (ref 0.3–1.2)
Total Protein: 6.4 g/dL — ABNORMAL LOW (ref 6.5–8.1)

## 2022-04-02 LAB — TSH: TSH: 2.853 u[IU]/mL (ref 0.350–4.500)

## 2022-04-02 MED ORDER — SODIUM CHLORIDE 0.9 % IV SOLN
480.0000 mg | Freq: Once | INTRAVENOUS | Status: AC
  Administered 2022-04-02: 480 mg via INTRAVENOUS
  Filled 2022-04-02: qty 48

## 2022-04-02 MED ORDER — SODIUM CHLORIDE 0.9 % IV SOLN: Freq: Once | INTRAVENOUS | Status: AC

## 2022-04-02 NOTE — Progress Notes (Signed)
[facility] Telephone:([phone]) [phone]   Fax:(336) [phone]  OFFICE PROGRESS NOTE  [name], MD [address] [place] [address]  DIAGNOSIS: Metastatic renal cell carcinoma presented with large left renal mass in addition to significant left hemothorax pleural-based metastasis as well as left hilar and mediastinal lymphadenopathy diagnosed in August 2022.  Biomarker Findings Microsatellite status - MS-Stable Tumor Mutational Burden - 2 Muts/Mb Genomic Findings For a complete list of the genes assayed, please refer to the Appendix. MTAP loss CDKN2A/B CDKN2A loss 8 Disease relevant genes with no reportable Alteratio  PD-L1 expression 0%  PRIOR THERAPY: Systemic immunotherapy with ipilimumab 1 mg/KG in addition to nivolumab 3 mg/KG every 3 weeks for 4 cycles followed by maintenance treatment with nivolumab.  Status post 3 cycles.  First dose started on 05/22/2021.  This treatment was discontinued secondary to disease progression.   CURRENT THERAPY: Opdivo 480 Mg IV every 4 weeks with Cabometyx 40 mg p.o. daily.  First dose August 21, 2021.  Status post 9 month of treatment.  Her dose of Cabometyx will be reduced to 20 mg p.o. daily starting 01/08/2022 secondary to persistent diarrhea.  INTERVAL HISTORY: Gail Fitzgerald 87 y.o. female returns to the clinic today for follow-up visit accompanied by her son and the [place] interpreter, [place].  The patient is feeling fine today with no concerning complaints except for the intermittent left-sided chest pain.  She denied having any shortness of breath, cough or hemoptysis.  She has no current nausea, vomiting, diarrhea or constipation.  She has no headache or visual changes but occasional lightheadedness.  She continues to tolerate her treatment with nivolumab and Cabometyx fairly well.  She is here today for evaluation before starting cycle #13 of her treatment.   MEDICAL HISTORY: Past Medical History:   Diagnosis Date   Hypertension     ALLERGIES:  has no active allergies.  MEDICATIONS:  Current Outpatient Medications  Medication Sig Dispense Refill   acetaminophen (TYLENOL) 325 MG tablet Take 650 mg by mouth every 6 (six) hours as needed for headache.     Aflibercept (EYLEA IO) Inject 1 Dose into the eye every 30 (thirty) days. IO left eye monthly     amLODipine (NORVASC) 10 MG tablet Take 10 mg by mouth daily.     benazepril (LOTENSIN) 20 MG tablet Take 20 mg by mouth daily.     cabozantinib (CABOMETYX) 20 MG tablet Take 1 tablet (20 mg total) by mouth daily. Take on an empty stomach, 1 hour before or 2 hours after meals. 30 tablet 2   celecoxib (CELEBREX) 100 MG capsule Take 1 capsule (100 mg total) by mouth 2 (two) times daily. 60 capsule 1   ferrous sulfate 325 (65 FE) MG EC tablet TAKE 1 TABLET BY MOUTH EVERY DAY WITH BREAKFAST 90 tablet 1   LORazepam (ATIVAN) 0.5 MG tablet 1 tablet p.o. 30 minutes before the MRI.  May repeat once if needed. (Patient not taking: Reported on 03/05/2022) 2 tablet 0   metoprolol succinate (TOPROL-XL) 100 MG 24 hr tablet Take 50 mg by mouth in the morning and at bedtime.     mirtazapine (REMERON) 7.5 MG tablet TAKE 1 TABLET BY MOUTH EVERYDAY AT BEDTIME 90 tablet 1   Misc. Devices (WHEELCHAIR) MISC Light weight     OVER THE COUNTER MEDICATION Take 1 tablet by mouth daily as needed (for stomach discomfort). Allochol - (Herbal Supplement) Multivitamin with : Magnesium, potassium, garlic and charcoal. (  [place] Medication)     oxyCODONE-acetaminophen (PERCOCET/ROXICET) 5-325 MG tablet Take 1 tablet by mouth every 8 (eight) hours as needed for severe pain. (Patient not taking: Reported on 03/05/2022) 30 tablet 0   pantoprazole (PROTONIX) 40 MG tablet TAKE 1 TABLET BY MOUTH EVERY DAY 30 tablet 1   prochlorperazine (COMPAZINE) 10 MG tablet Take 1 tablet (10 mg total) by mouth every 6 (six) hours as needed for nausea or vomiting. (Patient not taking: Reported on  03/05/2022) 30 tablet 2   simvastatin (ZOCOR) 20 MG tablet Take 20 mg by mouth every evening.     UNABLE TO FIND Take 1 tablet by mouth daily as needed (chest pain (angina)). Med Name: Validol (Menthyl isovalerate and menthol) (Russian Medication)     No current facility-administered medications for this visit.    SURGICAL HISTORY:  Past Surgical History:  Procedure Laterality Date   IR THORACENTESIS ASP PLEURAL SPACE W/IMG GUIDE  04/05/2021   THORACENTESIS N/A 04/12/2021   Procedure: Mathews Robinsons;  Surgeon: [name], DO;  Location: [facility];  Service: Pulmonary;  Laterality: N/A;    REVIEW OF SYSTEMS:  A comprehensive review of systems was negative except for: Constitutional: positive for fatigue Respiratory: positive for pleurisy/chest pain   PHYSICAL EXAMINATION: General appearance: alert, cooperative, fatigued, and no distress Head: Normocephalic, without obvious abnormality, atraumatic Neck: no adenopathy, no JVD, supple, symmetrical, trachea midline, and thyroid not enlarged, symmetric, no tenderness/mass/nodules Lymph nodes: Cervical, supraclavicular, and axillary nodes normal. Resp: clear to auscultation bilaterally Back: symmetric, no curvature. ROM normal. No CVA tenderness. Cardio: regular rate and rhythm, S1, S2 normal, no murmur, click, rub or gallop GI: soft, non-tender; bowel sounds normal; no masses,  no organomegaly Extremities: extremities normal, atraumatic, no cyanosis or edema  ECOG PERFORMANCE STATUS: 1 - Symptomatic but completely ambulatory  Blood pressure (!) 170/71, pulse 61, temperature 97.7 F (36.5 C), temperature source Oral, resp. rate 15, weight 95 lb 3.2 oz (43.2 kg), SpO2 100 %.  LABORATORY DATA: Lab Results  Component Value Date   WBC 9.3 03/05/2022   HGB 12.0 03/05/2022   HCT 35.5 (L) 03/05/2022   MCV 95.7 03/05/2022   PLT 208 03/05/2022      Chemistry      Component Value Date/Time   NA 127 (L) 03/05/2022 0912   K 4.6  03/05/2022 0912   CL 94 (L) 03/05/2022 0912   CO2 24 03/05/2022 0912   BUN 15 03/05/2022 0912   CREATININE 0.64 03/05/2022 0912      Component Value Date/Time   CALCIUM 9.0 03/05/2022 0912   ALKPHOS 83 03/05/2022 0912   AST 16 03/05/2022 0912   ALT 7 03/05/2022 0912   BILITOT 0.4 03/05/2022 0912       RADIOGRAPHIC STUDIES: No results found.  ASSESSMENT AND PLAN: This is a an 87 years old white female who originally from [place] with metastatic renal cell carcinoma presented with large left renal mass in addition to left hemithorax pleural-based metastasis as well as left hilar and mediastinal lymphadenopathy diagnosed in 2022.  Her molecular studies showed no actionable mutations and PD-L1 expression was negative. The patient had MRI of the brain performed recently that showed no evidence of metastatic disease to the brain. She started systemic treatment with immunotherapy with ipilimumab 1 mg/KG as well as nivolumab 3 mg/KG every 3 weeks status post 4 cycles.  This treatment was discontinued secondary to disease progression. She is currently on treatment with a combination of nivolumab 480 Mg IV every  4 weeks in addition to Cabometyx 40 mg p.o. daily status post 9 cycles. Her dose of Cabometyx was reduced to 20 mg p.o. daily starting cycle #6 secondary to drug-induced diarrhea. The patient has been tolerating this treatment well with no concerning adverse effect except for mild fatigue. I recommended for the patient to proceed with her treatment today as planned. I will see her back for follow-up visit in 4 weeks for evaluation before the next cycle of her treatment. For the anemia her hemoglobin and hematocrit are better.  She was advised to stay on the ferrous sulfate. For the hyponatremia she was advised with fluid restriction and we will continue to monitor it closely. She was advised to call immediately if she has any other concerning symptoms in the interval  The patient voices  understanding of current disease status and treatment options and is in agreement with the current care plan. The total time spent in the appointment was 20 minutes.  All questions were answered. The patient knows to call the clinic with any problems, questions or concerns. We can certainly see the patient much sooner if necessary.   Disclaimer: This note was dictated with voice recognition software. Similar sounding words can inadvertently be transcribed and may not be corrected upon review.

## 2022-04-02 NOTE — Patient Instructions (Signed)
[facility]  Discharge Instructions: Thank you for choosing [facility] to provide your oncology and hematology care.   If you have a lab appointment with the [facility], please go directly to the [facility] and check in at the registration area.   Wear comfortable clothing and clothing appropriate for easy access to any Portacath or PICC line.   We strive to give you quality time with your provider. You may need to reschedule your appointment if you arrive late (15 or more minutes).  Arriving late affects you and other patients whose appointments are after yours.  Also, if you miss three or more appointments without notifying the office, you may be dismissed from the clinic at the provider's discretion.      For prescription refill requests, have your pharmacy contact our office and allow 72 hours for refills to be completed.    Today you received the following chemotherapy and/or immunotherapy agents: Opdivo      To help prevent nausea and vomiting after your treatment, we encourage you to take your nausea medication as directed.  BELOW ARE SYMPTOMS THAT SHOULD BE REPORTED IMMEDIATELY: *FEVER GREATER THAN 100.4 F (38 C) OR HIGHER *CHILLS OR SWEATING *NAUSEA AND VOMITING THAT IS NOT CONTROLLED WITH YOUR NAUSEA MEDICATION *UNUSUAL SHORTNESS OF BREATH *UNUSUAL BRUISING OR BLEEDING *URINARY PROBLEMS (pain or burning when urinating, or frequent urination) *BOWEL PROBLEMS (unusual diarrhea, constipation, pain near the anus) TENDERNESS IN MOUTH AND THROAT WITH OR WITHOUT PRESENCE OF ULCERS (sore throat, sores in mouth, or a toothache) UNUSUAL RASH, SWELLING OR PAIN  UNUSUAL VAGINAL DISCHARGE OR ITCHING   Items with * indicate a potential emergency and should be followed up as soon as possible or go to the Emergency Department if any problems should occur.  Please show the CHEMOTHERAPY ALERT CARD or IMMUNOTHERAPY ALERT CARD at check-in to the  Emergency Department and triage nurse.  Should you have questions after your visit or need to cancel or reschedule your appointment, please contact [facility]  Dept: [phone]  and follow the prompts.  Office hours are 8:00 a.m. to 4:30 p.m. Monday - Friday. Please note that voicemails left after 4:00 p.m. may not be returned until the following business day.  We are closed weekends and major holidays. You have access to a nurse at all times for urgent questions. Please call the main number to the clinic Dept: [phone] and follow the prompts.   For any non-urgent questions, you may also contact your provider using MyChart. We now offer e-Visits for anyone 42 and older to request care online for non-urgent symptoms. For details visit [email or web address].   Also download the MyChart app! Go to the app store, search "MyChart", open the app, select [facility], and log in with your MyChart username and password.  Masks are optional in the cancer centers. If you would like for your care team to wear a mask while they are taking care of you, please let them know. For doctor visits, patients may have with them one support person who is at least 85 years old. At this time, visitors are not allowed in the infusion area.

## 2022-04-02 NOTE — Progress Notes (Signed)
Nutrition Follow-up:  Patient with metastatic renal cell carcinoma. She is currently receiving opdivo q4 weeks with daily carbometyx.    Met with patient in infusion. Interpretor present at visit. Patient reports appetite is great. Says it is scary how much food she eats sometimes. Patient reports occasionally her son will give her a few sips of beer which stimulates her appetite. She recalls a variety of fruits, vegetables, stews, lamb, beans. Reports drinking 2 supplements daily. Patient denies nausea, vomiting, diarrhea, constipation.      Medications: reviewed   Labs: glucose 118, Na 129  Anthropometrics: Weight 95 lb 3.2 oz today   6/27 - 94 lb  5/30 - 92 lb 8 oz  5/2 - 88 lb 2 oz    NUTRITION DIAGNOSIS: Unintentional weight loss improving    INTERVENTION:  Continue high calorie high protein foods to promote weight gain Continue appetite stimulant (remeron) er MD    MONITORING, EVALUATION, GOAL: weight trends, intake    NEXT VISIT: To be scheduled as needed

## 2022-04-02 NOTE — Progress Notes (Signed)
[facility]  Telephone:(336) [phone] Fax:(336) [phone]   Name: Gail Fitzgerald Date: 04/02/2022 MRN: [id number]  DOB: 18-Apr-1935  Patient Care Team: [name], MD as PCP - General (Family Medicine)    REASON FOR CONSULTATION: Gail Fitzgerald is a 87 y.o. 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  Now actively receiving Opdivo and Cabometyx.  Palliative ask to see for symptom management and goals of care.    SOCIAL HISTORY:     reports that she has never smoked. She has never used smokeless tobacco. She reports that she does not currently use alcohol. She reports that she does not currently use drugs.  ADVANCE DIRECTIVES:  Patient does not have advanced directives.  Does not wish to discuss.  CODE STATUS: Full code  PAST MEDICAL HISTORY: Past Medical History:  Diagnosis Date   Hypertension     PAST SURGICAL HISTORY:  Past Surgical History:  Procedure Laterality Date   IR THORACENTESIS ASP PLEURAL SPACE W/IMG GUIDE  04/05/2021   THORACENTESIS N/A 04/12/2021   Procedure: Mathews Robinsons;  Surgeon: [name], DO;  Location: [facility];  Service: Pulmonary;  Laterality: N/A;    HEMATOLOGY/ONCOLOGY HISTORY:  Oncology History  Renal cell carcinoma, left ([facility])  05/09/2021 Initial Diagnosis   Renal cell carcinoma, left ([facility])   05/22/2021 -  Chemotherapy   Patient is on Treatment Plan : Renal cell nivolumab + Ipilimumab q21d        ALLERGIES:  has no active allergies.  MEDICATIONS:  Current Outpatient Medications  Medication Sig Dispense Refill   acetaminophen (TYLENOL) 325 MG tablet Take 650 mg by mouth every 6 (six) hours as needed for headache.     Aflibercept (EYLEA IO) Inject 1 Dose into the eye every 30 (thirty) days.  IO left eye monthly     amLODipine (NORVASC) 10 MG tablet Take 10 mg by mouth daily.     benazepril (LOTENSIN) 20 MG tablet Take 20 mg by mouth daily.     cabozantinib (CABOMETYX) 20 MG tablet Take 1 tablet (20 mg total) by mouth daily. Take on an empty stomach, 1 hour before or 2 hours after meals. 30 tablet 2   celecoxib (CELEBREX) 100 MG capsule Take 1 capsule (100 mg total) by mouth 2 (two) times daily. 60 capsule 1   ferrous sulfate 325 (65 FE) MG EC tablet TAKE 1 TABLET BY MOUTH EVERY DAY WITH BREAKFAST 90 tablet 1   metoprolol tartrate (LOPRESSOR) 50 MG tablet Take 50 mg by mouth 2 (two) times daily.     mirtazapine (REMERON) 7.5 MG tablet TAKE 1 TABLET BY MOUTH EVERYDAY AT BEDTIME 90 tablet 1   Misc. Devices (WHEELCHAIR) MISC Light weight     OVER THE COUNTER MEDICATION Take 1 tablet by mouth daily as needed (for stomach discomfort). Allochol - (Herbal Supplement) Multivitamin with : Magnesium, potassium, garlic and charcoal. (Russian Medication)     pantoprazole (PROTONIX) 40 MG tablet TAKE 1 TABLET BY MOUTH EVERY DAY 30 tablet 1   prochlorperazine (COMPAZINE) 10 MG tablet Take 1 tablet (10 mg total) by mouth every 6 (six) hours as needed for nausea or vomiting. (Patient not taking: Reported on 03/05/2022) 30 tablet 2   simvastatin (ZOCOR) 20 MG tablet Take 20 mg by mouth every evening.     UNABLE TO FIND Take 1 tablet by  mouth daily as needed (chest pain (angina)). Med Name: Validol (Menthyl isovalerate and menthol) (Russian Medication)     No current facility-administered medications for this visit.    VITAL SIGNS: There were no vitals taken for this visit. There were no vitals filed for this visit.   Estimated body mass index is 17.99 kg/m as calculated from the following:   Height as of 02/05/22: 5\' 1"  (1.549 m).   Weight as of an earlier encounter on 04/02/22: 95 lb 3.2 oz (43.2 kg).  PERFORMANCE STATUS (ECOG) : 2 - Symptomatic, <50% confined to bed  Physical Exam General:  NAD Cardiovascular: RRR  Pulmonary:normal breathing pattern  Neurological: AAOx4 with a [place] interpreter  IMPRESSION:  I saw Gail Fitzgerald during her infusion today. Alfina ([place] Interpretor) present. No acute distress noted. Continues to do well. Shares pictures of her newly born great-grandchild in [place]. Is walking several times a week outside with a walker.   Neoplasm related pain Gail Fitzgerald states her pain has resolved. Endorses occasional headaches and generalized aching.  Tylenol for minor aches and pains. Wishes to continue with Celebrex use however is not requiring any additional pain medications. Much appreciative of this.   We will continue to be available as needed. Patient knows to contact office with any symptom management needs.   Decreased appetite/weight loss  Appetite is good. Reports she is eating more and able to enjoy many different foods again. Her weight continues to increase. She is up to 95lbs from 94 lbs last visit, 88.8 pounds. 90.2 lbs on 2/13, 97 lbs on 08/28/21, and 114 lbs on 06/12/21.  She is being followed by the dietitian.   Loose stools are much improved.   Ongoing discussions regarding eating some of her favorite foods, frequent snacking, small frequent meals daily versus trying to attempt large meals, and eating at the onset of hunger sensation of possible. She enjoys fruits, vegetables, and pastries.  Education provided on possibly adding protein powder to her fruits and making a smoothie.  She does not like the Ensure or boost reporting they are too sweet however is trying to drink them. Is concerned they may be contributing to her loose stools. We discussed identifying foods that she eat that may be causing increased stools afterwards. She is lactose intolerant.   She also complains of dry mouth more specifically first in the morning and late evening. Education provided on hard candies to increase salivation in addition to the use of Biotene. This has  improved.   I discussed the importance of continued conversation with family and their medical providers regarding overall plan of care and treatment options, ensuring decisions are within the context of the patients values and GOCs.  PLAN: Tylenol 1000mg  three times daily Continue Celebrex 200 mg daily.  Patient reports pain is improved.   Biotene gum, mouthwash, toothpaste for dry mouth. Imodium for diarrhea  Weight has increased and appetite is much improved.  Education also provided on increased protein intake with recommendations of adding protein powder to her fruits making a smoothie given she is unable to tolerate Ensure/boost. I will plan to see patient back in 8 weeks in collaboration to other oncology appointments.    Patient expressed understanding and was in agreement with this plan. She also understands that She can call the clinic at any time with any questions, concerns, or complaints.   Time Total: 20 min  Visit consisted of counseling and education dealing with the complex and emotionally intense issues of symptom  management and palliative care in the setting of serious and potentially life-threatening illness.Greater than 50%  of this time was spent counseling and coordinating care related to the above assessment and plan.  Alda Lea, AGPCNP-BC  Palliative Medicine Team/[facility]

## 2022-04-16 ENCOUNTER — Other Ambulatory Visit (HOSPITAL_COMMUNITY): Payer: Self-pay

## 2022-04-18 ENCOUNTER — Other Ambulatory Visit (HOSPITAL_COMMUNITY): Payer: Self-pay

## 2022-04-25 NOTE — Progress Notes (Signed)
[name] OFFICE PROGRESS NOTE  [name], MD [place] [address] [place] [address]  DIAGNOSIS:  Metastatic renal cell carcinoma presented with large left renal mass in addition to significant left hemothorax pleural-based metastasis as well as left hilar and mediastinal lymphadenopathy diagnosed in August 2022.   Biomarker Findings Microsatellite status - MS-Stable Tumor Mutational Burden - 2 Muts/Mb Genomic Findings For a complete list of the genes assayed, please refer to the Appendix. MTAP loss CDKN2A/B CDKN2A loss 8 Disease relevant genes with no reportable Alteratio   PD-L1 expression 0%  PRIOR THERAPY: Systemic immunotherapy with ipilimumab 1 mg/KG in addition to nivolumab 3 mg/KG every 3 weeks for 4 cycles followed by maintenance treatment with nivolumab.  Status post 3 cycles.  First dose started on 05/22/2021.  This treatment was discontinued secondary to disease progression.  CURRENT THERAPY:  Opdivo 480 Mg IV every 4 weeks with Cabometyx 40 mg p.o. daily.  First dose August 21, 2021. Status post 13 cycles.  Cabometryx Dose Reduced to 40 Mg P.O. Daily Starting from May 2nd, 2023 due to intolerance due to diarrhea.  INTERVAL HISTORY: Gail Fitzgerald 87 y.o. female returns to the clinic today for a follow-up visit accompanied by her son and her [place] interpreter.  The patient is feeling well today without any concerning complaints.  The patient was last seen in clinic 4 weeks ago by Dr. Julien Nordmann.  The patient is currently undergoing infusions once a month with nivolumab which she tolerates well.  The patient is also on oral treatment with cabometryx.  This is dose reduced to 20 mg p.o. daily due to diarrhea. This dose was reduced in May 2023.  She is tolerating this better with less diarrhea.  She uses Imodium if needed for diarrhea but reports that she has not needed to take any Imodium in approximately 3 weeks or so.  The patient is also  followed closely by palliative care and a member the nutritionist team.  She initially was having decreased appetite and taste alterations.  She does not like the taste of boost and Ensure.  More recently her taste and appetite has improved.  She is using Biotene rinses.  She will also take a taste of alcohol which reportedly stimulates her appetite.  Gained 2 pounds since last being seen.  She is also active doing exercises approximately 4 times per week.  For pain, her pain is much better controlled at this time.  She does take Tylenol 1000 milligrams 3 times daily PRN.  She continues to localize her pain in the bandlike region in the left ribs.  Her pain is exacerbated by laying on her left side.  She also has not been needing to take her Celebrex as much.  She denies any fever, chills, night sweats, or unexplained weight loss.  Denies any shortness of breath, cough, or hemoptysis.  She denies any nausea, vomiting, or constipation.  Denies any changes with her bladder habits.  Denies any rashes or skin changes.  She is here today for evaluation and repeat blood work before undergoing cycle #14 of nivolumab.      MEDICAL HISTORY: Past Medical History:  Diagnosis Date   Hypertension     ALLERGIES:  has No Known Allergies.  MEDICATIONS:  Current Outpatient Medications  Medication Sig Dispense Refill   acetaminophen (TYLENOL) 325 MG tablet Take 650 mg by mouth every 6 (six) hours as needed for headache.     Aflibercept (EYLEA IO) Inject 1  Dose into the eye every 30 (thirty) days. IO left eye monthly     amLODipine (NORVASC) 10 MG tablet Take 10 mg by mouth daily.     benazepril (LOTENSIN) 20 MG tablet Take 20 mg by mouth daily.     cabozantinib (CABOMETYX) 20 MG tablet Take 1 tablet (20 mg total) by mouth daily. Take on an empty stomach, 1 hour before or 2 hours after meals. 30 tablet 2   celecoxib (CELEBREX) 100 MG capsule Take 1 capsule (100 mg total) by mouth 2 (two) times daily. 60 capsule 1    ferrous sulfate 325 (65 FE) MG EC tablet TAKE 1 TABLET BY MOUTH EVERY DAY WITH BREAKFAST 90 tablet 1   metoprolol tartrate (LOPRESSOR) 50 MG tablet Take 50 mg by mouth 2 (two) times daily.     mirtazapine (REMERON) 7.5 MG tablet TAKE 1 TABLET BY MOUTH EVERYDAY AT BEDTIME 90 tablet 1   Misc. Devices (WHEELCHAIR) MISC Light weight     OVER THE COUNTER MEDICATION Take 1 tablet by mouth daily as needed (for stomach discomfort). Allochol - (Herbal Supplement) Multivitamin with : Magnesium, potassium, garlic and charcoal. (Russian Medication)     pantoprazole (PROTONIX) 40 MG tablet TAKE 1 TABLET BY MOUTH EVERY DAY 30 tablet 1   prochlorperazine (COMPAZINE) 10 MG tablet Take 1 tablet (10 mg total) by mouth every 6 (six) hours as needed for nausea or vomiting. 30 tablet 2   simvastatin (ZOCOR) 20 MG tablet Take 20 mg by mouth every evening.     UNABLE TO FIND Take 1 tablet by mouth daily as needed (chest pain (angina)). Med Name: Validol (Menthyl isovalerate and menthol) (Russian Medication)     No current facility-administered medications for this visit.    SURGICAL HISTORY:  Past Surgical History:  Procedure Laterality Date   IR THORACENTESIS ASP PLEURAL SPACE W/IMG GUIDE  04/05/2021   THORACENTESIS N/A 04/12/2021   Procedure: Mathews Robinsons;  Surgeon: [name], DO;  Location: [facility];  Service: Pulmonary;  Laterality: N/A;    REVIEW OF SYSTEMS:   Review of Systems  Constitutional: Negative for appetite change (improving), chills, fatigue, fever and unexpected weight change (improving).  HENT: Negative for mouth sores, nosebleeds, sore throat and trouble swallowing.   Eyes: Negative for eye problems and icterus.  Respiratory: Negative for cough, hemoptysis, shortness of breath and wheezing.   Cardiovascular: Positive for occasional radiating pain around her left lower rib cage with certain deep breaths or certain positions.  Negative for chest pain and leg swelling.   Gastrointestinal: Improving intermittent diarrhea.  Negative for abdominal pain, constipation, nausea and vomiting.  Genitourinary: Negative for bladder incontinence, difficulty urinating, dysuria, frequency and hematuria.   Musculoskeletal: Negative for back pain, gait problem, neck pain and neck stiffness.  Skin: Negative for itching and rash.  Neurological: Negative for dizziness, extremity weakness, gait problem, headaches, light-headedness and seizures.  Hematological: Negative for adenopathy. Does not bleed easily.  Positive for easy bruising on her upper extremities. Psychiatric/Behavioral: Negative for confusion, depression and sleep disturbance. The patient is not nervous/anxious.     PHYSICAL EXAMINATION:  Blood pressure (!) 156/70, pulse 61, temperature 98.2 F (36.8 C), temperature source Temporal, resp. rate 15, height _0  (1.549 m), weight 97 lb 11.2 oz (44.3 kg), SpO2 100 %.  ECOG PERFORMANCE STATUS: 1  Physical Exam  Constitutional: Oriented to person, place, and time and cachectic appearing female and in no distress.  HENT:  Head: Normocephalic and atraumatic.  Mouth/Throat: Oropharynx is  clear and moist. No oropharyngeal exudate.  Eyes: Conjunctivae are normal. Right eye exhibits no discharge. Left eye exhibits no discharge. No scleral icterus.  Neck: Normal range of motion. Neck supple.  Cardiovascular: Normal rate, regular rhythm, normal heart sounds and intact distal pulses.   Pulmonary/Chest: Effort normal and breath sounds normal. No respiratory distress. No wheezes. No rales.  Abdominal: Soft. Bowel sounds are normal. Exhibits no distension and no mass. There is no tenderness.  Musculoskeletal: Normal range of motion. Exhibits no edema.  Lymphadenopathy:    No cervical adenopathy.  Neurological: Alert and oriented to person, place, and time. Exhibits muscle wasting.  Gait normal. Coordination normal.  Skin: Skin is warm and dry. No rash noted. Not diaphoretic.  No erythema. No pallor.  Psychiatric: Mood, memory and judgment normal.  Vitals reviewed.  LABORATORY DATA: Lab Results  Component Value Date   WBC 9.0 04/30/2022   HGB 11.4 (L) 04/30/2022   HCT 33.7 (L) 04/30/2022   MCV 92.1 04/30/2022   PLT 205 04/30/2022      Chemistry      Component Value Date/Time   NA 131 (L) 04/30/2022 0838   K 4.7 04/30/2022 0838   CL 98 04/30/2022 0838   CO2 25 04/30/2022 0838   BUN 15 04/30/2022 0838   CREATININE 0.60 04/30/2022 0838      Component Value Date/Time   CALCIUM 8.9 04/30/2022 0838   ALKPHOS 85 04/30/2022 0838   AST 19 04/30/2022 0838   ALT 9 04/30/2022 0838   BILITOT 0.3 04/30/2022 0838       RADIOGRAPHIC STUDIES:  No results found.   ASSESSMENT/PLAN:  This is a very pleasant 87 year old Caucasian female originally from [place].  She has been diagnosed with metastatic renal cell carcinoma.  She presented with a large left renal mass in addition to left hemithorax pleural-based metastasis as well as left hilar mediastinal lymphadenopathy.  She was diagnosed in 2022.  Her molecular studies show no actionable mutations and her PD-L1 expression is negative.  The patient is currently undergoing treatment with immunotherapy with ipilimumab 1 mg/kg as well as nivolumab 3 mg/kg IV every 3 weeks status post 4 cycles treatment was discontinued due to disease progression.   The patient had disease progression after cycle #4 with left-sided pleural tumor with similar size of the left renal mass.  Dr. Julien Nordmann recommended adding Cabometyx 40 mg p.o. daily with her nivolumab IV every 4 weeks.  She started Cabometyx on 08/21/21.  She is status post 13 cycles.  Dr. Julien Nordmann reduce the dose to 20 mg p.o. daily at her appointment on 01/08/2022 due to intolerance with diarrhea.  .  Labs were reviewed.  Recommend that she continue on the same treatment at the same dose.  I will arrange for restaging CT scan the chest, abdomen, pelvis prior to starting her  next cycle of treatment.  I will order this without contrast.  She will continue using Tylenol for pain control.  She has Celebrex if needed.  She will continue to use Imodium if needed for diarrhea.  Fortunately, her pain, appetite, diarrhea has improved.  The patient was advised to call immediately if she has any concerning symptoms in the interval. The patient voices understanding of current disease status and treatment options and is in agreement with the current care plan. All questions were answered. The patient knows to call the clinic with any problems, questions or concerns. We can certainly see the patient much sooner if necessary  Orders Placed This Encounter  Procedures   CT CHEST ABDOMEN PELVIS WO CONTRAST    Standing Status:   Future    Standing Expiration Date:   05/01/2023    Order Specific Question:   If indicated for the ordered procedure, I authorize the administration of contrast media per Radiology protocol    Answer:   Yes    Order Specific Question:   Preferred imaging location?    Answer:   [facility]    Order Specific Question:   Is Oral Contrast requested for this exam?    Answer:   Yes, Per Radiology protocol     The total time spent in the appointment was 20-29 minutes.   Shivaay Stormont L Payzlee Ryder, PA-C 04/30/22

## 2022-04-27 ENCOUNTER — Other Ambulatory Visit: Payer: Self-pay | Admitting: Internal Medicine

## 2022-04-28 ENCOUNTER — Encounter: Payer: Self-pay | Admitting: Internal Medicine

## 2022-04-30 ENCOUNTER — Encounter: Payer: Self-pay | Admitting: Physician Assistant

## 2022-04-30 ENCOUNTER — Inpatient Hospital Stay: Payer: Medicaid Other | Attending: Internal Medicine | Admitting: Physician Assistant

## 2022-04-30 ENCOUNTER — Other Ambulatory Visit: Payer: Self-pay

## 2022-04-30 ENCOUNTER — Inpatient Hospital Stay: Payer: Medicaid Other

## 2022-04-30 VITALS — BP 156/70 | HR 61 | Temp 98.2°F | Resp 15 | Ht 61.0 in | Wt 97.7 lb

## 2022-04-30 DIAGNOSIS — C642 Malignant neoplasm of left kidney, except renal pelvis: Secondary | ICD-10-CM | POA: Insufficient documentation

## 2022-04-30 DIAGNOSIS — Z5112 Encounter for antineoplastic immunotherapy: Secondary | ICD-10-CM | POA: Diagnosis present

## 2022-04-30 DIAGNOSIS — Z79899 Other long term (current) drug therapy: Secondary | ICD-10-CM | POA: Diagnosis not present

## 2022-04-30 LAB — CBC WITH DIFFERENTIAL (CANCER CENTER ONLY)
Abs Immature Granulocytes: 0.02 10*3/uL (ref 0.00–0.07)
Basophils Absolute: 0 10*3/uL (ref 0.0–0.1)
Basophils Relative: 0 %
Eosinophils Absolute: 0.1 10*3/uL (ref 0.0–0.5)
Eosinophils Relative: 1 %
HCT: 33.7 % — ABNORMAL LOW (ref 36.0–46.0)
Hemoglobin: 11.4 g/dL — ABNORMAL LOW (ref 12.0–15.0)
Immature Granulocytes: 0 %
Lymphocytes Relative: 17 %
Lymphs Abs: 1.5 10*3/uL (ref 0.7–4.0)
MCH: 31.1 pg (ref 26.0–34.0)
MCHC: 33.8 g/dL (ref 30.0–36.0)
MCV: 92.1 fL (ref 80.0–100.0)
Monocytes Absolute: 1.1 10*3/uL — ABNORMAL HIGH (ref 0.1–1.0)
Monocytes Relative: 12 %
Neutro Abs: 6.3 10*3/uL (ref 1.7–7.7)
Neutrophils Relative %: 70 %
Platelet Count: 205 10*3/uL (ref 150–400)
RBC: 3.66 MIL/uL — ABNORMAL LOW (ref 3.87–5.11)
RDW: 13.8 % (ref 11.5–15.5)
WBC Count: 9 10*3/uL (ref 4.0–10.5)
nRBC: 0 % (ref 0.0–0.2)

## 2022-04-30 LAB — CMP (CANCER CENTER ONLY)
ALT: 9 U/L (ref 0–44)
AST: 19 U/L (ref 15–41)
Albumin: 4 g/dL (ref 3.5–5.0)
Alkaline Phosphatase: 85 U/L (ref 38–126)
Anion gap: 8 (ref 5–15)
BUN: 15 mg/dL (ref 8–23)
CO2: 25 mmol/L (ref 22–32)
Calcium: 8.9 mg/dL (ref 8.9–10.3)
Chloride: 98 mmol/L (ref 98–111)
Creatinine: 0.6 mg/dL (ref 0.44–1.00)
GFR, Estimated: >60 mL/min (ref >60)
Glucose, Bld: 82 mg/dL (ref 70–99)
Potassium: 4.7 mmol/L (ref 3.5–5.1)
Sodium: 131 mmol/L — ABNORMAL LOW (ref 135–145)
Total Bilirubin: 0.3 mg/dL (ref 0.3–1.2)
Total Protein: 6.5 g/dL (ref 6.5–8.1)

## 2022-04-30 LAB — TSH: TSH: 3.024 u[IU]/mL (ref 0.350–4.500)

## 2022-04-30 MED ORDER — SODIUM CHLORIDE 0.9 % IV SOLN: Freq: Once | INTRAVENOUS | Status: DC

## 2022-04-30 MED ORDER — SODIUM CHLORIDE 0.9 % IV SOLN: 480.0000 mg | Freq: Once | INTRAVENOUS | Status: DC

## 2022-04-30 MED ORDER — SODIUM CHLORIDE 0.9 % IV SOLN: Freq: Once | INTRAVENOUS | Status: AC

## 2022-04-30 MED ORDER — SODIUM CHLORIDE 0.9 % IV SOLN
480.0000 mg | Freq: Once | INTRAVENOUS | Status: AC
  Administered 2022-04-30: 480 mg via INTRAVENOUS
  Filled 2022-04-30: qty 48

## 2022-04-30 NOTE — Patient Instructions (Signed)
[facility]  Discharge Instructions: Thank you for choosing [facility] to provide your oncology and hematology care.   If you have a lab appointment with the [facility], please go directly to the [facility] and check in at the registration area.   Wear comfortable clothing and clothing appropriate for easy access to any Portacath or PICC line.   We strive to give you quality time with your provider. You may need to reschedule your appointment if you arrive late (15 or more minutes).  Arriving late affects you and other patients whose appointments are after yours.  Also, if you miss three or more appointments without notifying the office, you may be dismissed from the clinic at the provider's discretion.      For prescription refill requests, have your pharmacy contact our office and allow 72 hours for refills to be completed.    Today you received the following chemotherapy and/or immunotherapy agents: Opdivo      To help prevent nausea and vomiting after your treatment, we encourage you to take your nausea medication as directed.  BELOW ARE SYMPTOMS THAT SHOULD BE REPORTED IMMEDIATELY: *FEVER GREATER THAN 100.4 F (38 C) OR HIGHER *CHILLS OR SWEATING *NAUSEA AND VOMITING THAT IS NOT CONTROLLED WITH YOUR NAUSEA MEDICATION *UNUSUAL SHORTNESS OF BREATH *UNUSUAL BRUISING OR BLEEDING *URINARY PROBLEMS (pain or burning when urinating, or frequent urination) *BOWEL PROBLEMS (unusual diarrhea, constipation, pain near the anus) TENDERNESS IN MOUTH AND THROAT WITH OR WITHOUT PRESENCE OF ULCERS (sore throat, sores in mouth, or a toothache) UNUSUAL RASH, SWELLING OR PAIN  UNUSUAL VAGINAL DISCHARGE OR ITCHING   Items with * indicate a potential emergency and should be followed up as soon as possible or go to the Emergency Department if any problems should occur.  Please show the CHEMOTHERAPY ALERT CARD or IMMUNOTHERAPY ALERT CARD at check-in to the  Emergency Department and triage nurse.  Should you have questions after your visit or need to cancel or reschedule your appointment, please contact [facility]  Dept: [phone]  and follow the prompts.  Office hours are 8:00 a.m. to 4:30 p.m. Monday - Friday. Please note that voicemails left after 4:00 p.m. may not be returned until the following business day.  We are closed weekends and major holidays. You have access to a nurse at all times for urgent questions. Please call the main number to the clinic Dept: [phone] and follow the prompts.   For any non-urgent questions, you may also contact your provider using MyChart. We now offer e-Visits for anyone 23 and older to request care online for non-urgent symptoms. For details visit [email or web address].   Also download the MyChart app! Go to the app store, search "MyChart", open the app, select [facility], and log in with your MyChart username and password.  Masks are optional in the cancer centers. If you would like for your care team to wear a mask while they are taking care of you, please let them know. For doctor visits, patients may have with them one support Prajwal Fellner who is at least 85 years old. At this time, visitors are not allowed in the infusion area.

## 2022-05-01 ENCOUNTER — Other Ambulatory Visit (HOSPITAL_COMMUNITY): Payer: Self-pay

## 2022-05-06 ENCOUNTER — Other Ambulatory Visit (HOSPITAL_COMMUNITY): Payer: Self-pay

## 2022-05-06 ENCOUNTER — Telehealth: Payer: Self-pay | Admitting: Internal Medicine

## 2022-05-06 NOTE — Telephone Encounter (Signed)
Scheduled per 08/22 los, called and spoke with patients daughter in law. Patient will be notified of all upcoming appointments.

## 2022-05-20 ENCOUNTER — Other Ambulatory Visit: Payer: Self-pay | Admitting: Nurse Practitioner

## 2022-05-20 ENCOUNTER — Other Ambulatory Visit (HOSPITAL_COMMUNITY): Payer: Self-pay

## 2022-05-20 MED ORDER — CELECOXIB 100 MG PO CAPS
100.0000 mg | ORAL_CAPSULE | Freq: Two times a day (BID) | ORAL | 1 refills | Status: DC
  Filled 2022-05-20: qty 60, 30d supply, fill #0
  Filled 2022-06-17: qty 60, 30d supply, fill #1

## 2022-05-23 ENCOUNTER — Ambulatory Visit (HOSPITAL_COMMUNITY)
Admission: RE | Admit: 2022-05-23 | Discharge: 2022-05-23 | Disposition: A | Discharge status: Home / Self Care | Payer: Medicaid Other | Source: Ambulatory Visit | Attending: Physician Assistant | Admitting: Physician Assistant

## 2022-05-23 ENCOUNTER — Encounter (HOSPITAL_COMMUNITY): Payer: Self-pay

## 2022-05-23 DIAGNOSIS — C642 Malignant neoplasm of left kidney, except renal pelvis: Secondary | ICD-10-CM

## 2022-05-24 ENCOUNTER — Other Ambulatory Visit: Payer: Self-pay | Admitting: Internal Medicine

## 2022-05-28 ENCOUNTER — Inpatient Hospital Stay (HOSPITAL_BASED_OUTPATIENT_CLINIC_OR_DEPARTMENT_OTHER): Payer: Medicaid Other | Admitting: Internal Medicine

## 2022-05-28 ENCOUNTER — Other Ambulatory Visit: Payer: Self-pay

## 2022-05-28 ENCOUNTER — Inpatient Hospital Stay: Payer: Medicaid Other

## 2022-05-28 ENCOUNTER — Inpatient Hospital Stay (HOSPITAL_BASED_OUTPATIENT_CLINIC_OR_DEPARTMENT_OTHER): Payer: Medicaid Other | Admitting: Nurse Practitioner

## 2022-05-28 ENCOUNTER — Encounter: Payer: Self-pay | Admitting: Internal Medicine

## 2022-05-28 ENCOUNTER — Inpatient Hospital Stay: Payer: Medicaid Other | Admitting: Dietician

## 2022-05-28 ENCOUNTER — Inpatient Hospital Stay: Payer: Medicaid Other | Attending: Internal Medicine

## 2022-05-28 ENCOUNTER — Encounter: Payer: Self-pay | Admitting: Nurse Practitioner

## 2022-05-28 DIAGNOSIS — Z5112 Encounter for antineoplastic immunotherapy: Secondary | ICD-10-CM | POA: Insufficient documentation

## 2022-05-28 DIAGNOSIS — Z515 Encounter for palliative care: Secondary | ICD-10-CM

## 2022-05-28 DIAGNOSIS — C642 Malignant neoplasm of left kidney, except renal pelvis: Secondary | ICD-10-CM

## 2022-05-28 DIAGNOSIS — Z79899 Other long term (current) drug therapy: Secondary | ICD-10-CM | POA: Diagnosis not present

## 2022-05-28 LAB — CBC WITH DIFFERENTIAL (CANCER CENTER ONLY)
Abs Immature Granulocytes: 0.06 10*3/uL (ref 0.00–0.07)
Basophils Absolute: 0 10*3/uL (ref 0.0–0.1)
Basophils Relative: 0 %
Eosinophils Absolute: 0.1 10*3/uL (ref 0.0–0.5)
Eosinophils Relative: 1 %
HCT: 34.3 % — ABNORMAL LOW (ref 36.0–46.0)
Hemoglobin: 11.2 g/dL — ABNORMAL LOW (ref 12.0–15.0)
Immature Granulocytes: 1 %
Lymphocytes Relative: 16 %
Lymphs Abs: 1.6 10*3/uL (ref 0.7–4.0)
MCH: 30.4 pg (ref 26.0–34.0)
MCHC: 32.7 g/dL (ref 30.0–36.0)
MCV: 93.2 fL (ref 80.0–100.0)
Monocytes Absolute: 1 10*3/uL (ref 0.1–1.0)
Monocytes Relative: 10 %
Neutro Abs: 7.3 10*3/uL (ref 1.7–7.7)
Neutrophils Relative %: 72 %
Platelet Count: 202 10*3/uL (ref 150–400)
RBC: 3.68 MIL/uL — ABNORMAL LOW (ref 3.87–5.11)
RDW: 14.2 % (ref 11.5–15.5)
WBC Count: 10 10*3/uL (ref 4.0–10.5)
nRBC: 0 % (ref 0.0–0.2)

## 2022-05-28 LAB — CMP (CANCER CENTER ONLY)
ALT: 8 U/L (ref 0–44)
AST: 17 U/L (ref 15–41)
Albumin: 3.9 g/dL (ref 3.5–5.0)
Alkaline Phosphatase: 99 U/L (ref 38–126)
Anion gap: 8 (ref 5–15)
BUN: 14 mg/dL (ref 8–23)
CO2: 26 mmol/L (ref 22–32)
Calcium: 8.7 mg/dL — ABNORMAL LOW (ref 8.9–10.3)
Chloride: 95 mmol/L — ABNORMAL LOW (ref 98–111)
Creatinine: 0.58 mg/dL (ref 0.44–1.00)
GFR, Estimated: >60 mL/min (ref >60)
Glucose, Bld: 130 mg/dL — ABNORMAL HIGH (ref 70–99)
Potassium: 4.5 mmol/L (ref 3.5–5.1)
Sodium: 129 mmol/L — ABNORMAL LOW (ref 135–145)
Total Bilirubin: 0.4 mg/dL (ref 0.3–1.2)
Total Protein: 6.7 g/dL (ref 6.5–8.1)

## 2022-05-28 LAB — TSH: TSH: 2.768 u[IU]/mL (ref 0.350–4.500)

## 2022-05-28 MED ORDER — SODIUM CHLORIDE 0.9 % IV SOLN
480.0000 mg | Freq: Once | INTRAVENOUS | Status: AC
  Administered 2022-05-28: 480 mg via INTRAVENOUS
  Filled 2022-05-28: qty 48

## 2022-05-28 MED ORDER — SODIUM CHLORIDE 0.9 % IV SOLN: Freq: Once | INTRAVENOUS | Status: AC

## 2022-05-28 NOTE — Progress Notes (Signed)
Nutrition Follow-up:  Patient receiving opdivo q4w plus daily carbometyx for metastatic renal cancer.   Met with patient during infusion. Interpretor present for visit today. Patient reports her appetite has been good. Says she eats all the time. Patient reports she has been enjoying small amounts of sweets recently. She did not like the taste of sweets a few months ago. Patient denies nutrition impact symptoms.    Medications: reviewed   Labs: Na 129, glucose 130  Anthropometrics: Weight 100 lb 4.8 oz today increased   8/22 - 97 lb 11.2 oz 7/25 - 95 lb 3.2 oz  6/27 - 94 lb   NUTRITION DIAGNOSIS: Unintentional weight loss resolved   INTERVENTION:  Continue high calorie high protein foods to promote weight gain Continue appetite stimulant per MD    MONITORING, EVALUATION, GOAL: weight trends, intake   NEXT VISIT: To be scheduled as needed with treatment

## 2022-05-28 NOTE — Progress Notes (Signed)
[facility] Telephone:(336) [phone]   Fax:(336) [phone]  OFFICE PROGRESS NOTE  [name], MD [place] [address] [place] [address]  DIAGNOSIS: Metastatic renal cell carcinoma presented with large left renal mass in addition to significant left hemothorax pleural-based metastasis as well as left hilar and mediastinal lymphadenopathy diagnosed in August 2022.  Biomarker Findings Microsatellite status - MS-Stable Tumor Mutational Burden - 2 Muts/Mb Genomic Findings For a complete list of the genes assayed, please refer to the Appendix. MTAP loss CDKN2A/B CDKN2A loss 8 Disease relevant genes with no reportable Alteratio  PD-L1 expression 0%  PRIOR THERAPY: Systemic immunotherapy with ipilimumab 1 mg/KG in addition to nivolumab 3 mg/KG every 3 weeks for 4 cycles followed by maintenance treatment with nivolumab.  Status post 3 cycles.  First dose started on 05/22/2021.  This treatment was discontinued secondary to disease progression.   CURRENT THERAPY: Opdivo 480 Mg IV every 4 weeks with Cabometyx 40 mg p.o. daily.  First dose August 21, 2021.  Status post 11 month of treatment.  Her dose of Cabometyx will be reduced to 20 mg p.o. daily starting 01/08/2022 secondary to persistent diarrhea.  INTERVAL HISTORY: Gail Fitzgerald 87 y.o. female returns to the clinic today for follow-up visit accompanied by her daughter-in-law and her [place] interpreter.  The patient is feeling fine except for intermittent pain on the left side of the chest.  She denied having any current nausea, vomiting, diarrhea or constipation.  She has no shortness of breath, cough or hemoptysis.  She has no recent weight loss or night sweats.  She has no headache or visual changes.  She has been tolerating her treatment with Opdivo and Cabometyx fairly well.  She is here today for evaluation with repeat CT scan of the chest, abdomen and pelvis for restaging of her disease.   MEDICAL  HISTORY: Past Medical History:  Diagnosis Date   Hypertension     ALLERGIES:  has No Known Allergies.  MEDICATIONS:  Current Outpatient Medications  Medication Sig Dispense Refill   acetaminophen (TYLENOL) 325 MG tablet Take 650 mg by mouth every 6 (six) hours as needed for headache.     Aflibercept (EYLEA IO) Inject 1 Dose into the eye every 30 (thirty) days. IO left eye monthly     amLODipine (NORVASC) 10 MG tablet Take 10 mg by mouth daily.     benazepril (LOTENSIN) 20 MG tablet Take 20 mg by mouth daily.     cabozantinib (CABOMETYX) 20 MG tablet Take 1 tablet (20 mg total) by mouth daily. Take on an empty stomach, 1 hour before or 2 hours after meals. 30 tablet 2   celecoxib (CELEBREX) 100 MG capsule Take 1 capsule (100 mg total) by mouth 2 (two) times daily. 60 capsule 1   ferrous sulfate 325 (65 FE) MG EC tablet TAKE 1 TABLET BY MOUTH EVERY DAY WITH BREAKFAST 90 tablet 1   metoprolol tartrate (LOPRESSOR) 50 MG tablet Take 50 mg by mouth 2 (two) times daily.     mirtazapine (REMERON) 7.5 MG tablet TAKE 1 TABLET BY MOUTH EVERYDAY AT BEDTIME 90 tablet 1   Misc. Devices (WHEELCHAIR) MISC Light weight     OVER THE COUNTER MEDICATION Take 1 tablet by mouth daily as needed (for stomach discomfort). Allochol - (Herbal Supplement) Multivitamin with : Magnesium, potassium, garlic and charcoal. (Russian Medication)     pantoprazole (PROTONIX) 40 MG tablet TAKE 1 TABLET BY MOUTH EVERY DAY 30 tablet 1  prochlorperazine (COMPAZINE) 10 MG tablet Take 1 tablet (10 mg total) by mouth every 6 (six) hours as needed for nausea or vomiting. 30 tablet 2   simvastatin (ZOCOR) 20 MG tablet Take 20 mg by mouth every evening.     UNABLE TO FIND Take 1 tablet by mouth daily as needed (chest pain (angina)). Med Name: Validol (Menthyl isovalerate and menthol) (Russian Medication)     No current facility-administered medications for this visit.    SURGICAL HISTORY:  Past Surgical History:  Procedure  Laterality Date   IR THORACENTESIS ASP PLEURAL SPACE W/IMG GUIDE  04/05/2021   THORACENTESIS N/A 04/12/2021   Procedure: Mathews Robinsons;  Surgeon: [name], DO;  Location: [facility];  Service: Pulmonary;  Laterality: N/A;    REVIEW OF SYSTEMS:  Constitutional: positive for fatigue Eyes: negative Ears, nose, mouth, throat, and face: negative Respiratory: positive for pleurisy/chest pain Cardiovascular: negative Gastrointestinal: negative Genitourinary:negative Integument/breast: negative Hematologic/lymphatic: negative Musculoskeletal:negative Neurological: negative Behavioral/Psych: negative Endocrine: negative Allergic/Immunologic: negative   PHYSICAL EXAMINATION: General appearance: alert, cooperative, fatigued, and no distress Head: Normocephalic, without obvious abnormality, atraumatic Neck: no adenopathy, no JVD, supple, symmetrical, trachea midline, and thyroid not enlarged, symmetric, no tenderness/mass/nodules Lymph nodes: Cervical, supraclavicular, and axillary nodes normal. Resp: clear to auscultation bilaterally Back: symmetric, no curvature. ROM normal. No CVA tenderness. Cardio: regular rate and rhythm, S1, S2 normal, no murmur, click, rub or gallop GI: soft, non-tender; bowel sounds normal; no masses,  no organomegaly Extremities: extremities normal, atraumatic, no cyanosis or edema Neurologic: Alert and oriented X 3, normal strength and tone. Normal symmetric reflexes. Normal coordination and gait  ECOG PERFORMANCE STATUS: 1 - Symptomatic but completely ambulatory  Blood pressure (!) 154/68, pulse 73, temperature 97.7 F (36.5 C), temperature source Oral, resp. rate 15, weight 100 lb 4.8 oz (45.5 kg), SpO2 100 %.  LABORATORY DATA: Lab Results  Component Value Date   WBC 10.0 05/28/2022   HGB 11.2 (L) 05/28/2022   HCT 34.3 (L) 05/28/2022   MCV 93.2 05/28/2022   PLT 202 05/28/2022      Chemistry      Component Value Date/Time   NA 131 (L)  04/30/2022 0838   K 4.7 04/30/2022 0838   CL 98 04/30/2022 0838   CO2 25 04/30/2022 0838   BUN 15 04/30/2022 0838   CREATININE 0.60 04/30/2022 0838      Component Value Date/Time   CALCIUM 8.9 04/30/2022 0838   ALKPHOS 85 04/30/2022 0838   AST 19 04/30/2022 0838   ALT 9 04/30/2022 0838   BILITOT 0.3 04/30/2022 0838       RADIOGRAPHIC STUDIES: CT CHEST ABDOMEN PELVIS WO CONTRAST  Result Date: 05/24/2022 CLINICAL DATA:  Metastatic left renal cell carcinoma, ongoing immunotherapy * Tracking Code: BO * EXAM: CT CHEST, ABDOMEN AND PELVIS WITHOUT CONTRAST TECHNIQUE: Multidetector CT imaging of the chest, abdomen and pelvis was performed following the standard protocol without IV contrast. RADIATION DOSE REDUCTION: This exam was performed according to the departmental dose-optimization program which includes automated exposure control, adjustment of the mA and/or kV according to patient size and/or use of iterative reconstruction technique. COMPARISON:  02/28/2022 FINDINGS: CT CHEST FINDINGS Cardiovascular: Aortic atherosclerosis. Unchanged enlargement of the tubular ascending thoracic aorta, measuring up to 4.3 x 4.2 cm. Normal heart size. Three-vessel coronary artery calcifications. No pericardial effusion. Mediastinum/Nodes: Calcified right hilar lymph nodes. No definite mediastinal lymphadenopathy, however assessment is generally limited by extensive left-sided pleural disease and lack of intravenous contrast. Thyroid gland, trachea, and esophagus  demonstrate no significant findings. Lungs/Pleura: Very extensive, bulky nodular pleural metastatic disease throughout the left hemithorax and a small associated left pleural effusion, not appreciably changed compared to prior examination. Maximum thickness at the medial left lung base remains 3.7 cm (series 2, image 47). No significant change in a bulky pleural mass of the left costophrenic recess measuring 7.3 x 4.8 cm (series 2, image 55). No  significant right-sided pleural disease. Unchanged, partially calcified nodularity of the right pulmonary apex, most likely benign incidental pleuroparenchymal scarring (series 4, image 26). No acute airspace opacity. Musculoskeletal: No chest wall abnormality. No acute osseous findings. CT ABDOMEN PELVIS FINDINGS Hepatobiliary: No solid liver abnormality is seen. Gallstones. No gallbladder wall thickening, or biliary dilatation. Pancreas: Unremarkable. No pancreatic ductal dilatation or surrounding inflammatory changes. Spleen: Normal in size without significant abnormality. Adrenals/Urinary Tract: Adrenal glands are unremarkable. Unchanged heterogeneous attenuation exophytic mass of the anterior inferior pole of the left kidney measuring 3.1 x 2.4 cm (series 2, image 75). Additional fluid attenuation left renal cortical cysts, for which no further follow-up or characterization is specifically required. Punctuate nonobstructive calculus of the inferior pole of the right kidney. Bladder is unremarkable. Stomach/Bowel: Stomach is within normal limits. Appendix is not clearly visualized. No evidence of bowel wall thickening, distention, or inflammatory changes. Vascular/Lymphatic: Aortic atherosclerosis. No enlarged abdominal or pelvic lymph nodes. Reproductive: No mass or other abnormality. Other: No abdominal wall hernia or abnormality. No ascites. Musculoskeletal: Osteopenia. Unchanged wedge deformity of L1 (series 6, image 69). IMPRESSION: 1. Very extensive, bulky nodular pleural metastatic disease throughout the left hemithorax and a small associated left pleural effusion, not appreciably changed compared to prior examination. 2. Unchanged mass of the anterior inferior pole of the left kidney, consistent with known renal cell carcinoma. 3. Cholelithiasis. 4. Punctuate nonobstructive calculus of the inferior pole of the right kidney. 5. Coronary artery disease. Aortic Atherosclerosis (ICD10-I70.0). Electronically  Signed   By: Delanna Ahmadi M.D.   On: 05/24/2022 13:19    ASSESSMENT AND PLAN: This is a 87 years old white female who originally from [place] with metastatic renal cell carcinoma presented with large left renal mass in addition to left hemithorax pleural-based metastasis as well as left hilar and mediastinal lymphadenopathy diagnosed in 2022.  Her molecular studies showed no actionable mutations and PD-L1 expression was negative. The patient had MRI of the brain performed recently that showed no evidence of metastatic disease to the brain. She started systemic treatment with immunotherapy with ipilimumab 1 mg/KG as well as nivolumab 3 mg/KG every 3 weeks status post 4 cycles.  This treatment was discontinued secondary to disease progression. She is currently on treatment with a combination of nivolumab 480 Mg IV every 4 weeks in addition to Cabometyx 40 mg p.o. daily status post 11 cycles. Her dose of Cabometyx was reduced to 20 mg p.o. daily starting cycle #6 secondary to drug-induced diarrhea. The patient has been tolerating this treatment well with no concerning adverse effect except for occasional diarrhea. She had repeat CT scan of the chest, abdomen and pelvis performed recently.  I personally and independently reviewed the scan and discussed the result with the patient and her family. Her scan showed no concerning findings for disease progression. I recommended for her to continue her current treatment with nivolumab and Cabometyx as planned.  She will proceed with cycle #12 today. She will come back for follow-up visit in 4 weeks for evaluation before the next cycle of her treatment. For the anemia, she will  continue her current medication with oral ferrous sulfate. For the hyponatremia, we will continue with fluid restriction and continue to monitor it closely. The patient was advised to call immediately if she has any other concerning symptoms in the interval.  The patient voices  understanding of current disease status and treatment options and is in agreement with the current care plan. The total time spent in the appointment was 30 minutes.  All questions were answered. The patient knows to call the clinic with any problems, questions or concerns. We can certainly see the patient much sooner if necessary.   Disclaimer: This note was dictated with voice recognition software. Similar sounding words can inadvertently be transcribed and may not be corrected upon review.

## 2022-05-28 NOTE — Progress Notes (Signed)
[facility]  Telephone:(336) [phone] Fax:(336) [phone]   Name: Ashlyn Cabler Date: 05/28/2022 MRN: [id number]  DOB: 1935/02/16  Patient Care Team: [name], MD as PCP - General (Family Medicine)    REASON FOR CONSULTATION: Lonni Dirden is a 87 y.o. 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  Now actively receiving Opdivo and Cabometyx.  Palliative ask to see for symptom management and goals of care.    SOCIAL HISTORY:     reports that she has never smoked. She has never used smokeless tobacco. She reports that she does not currently use alcohol. She reports that she does not currently use drugs.  ADVANCE DIRECTIVES:  Patient does not have advanced directives.  Does not wish to discuss.  CODE STATUS: Full code  PAST MEDICAL HISTORY: Past Medical History:  Diagnosis Date   Hypertension     PAST SURGICAL HISTORY:  Past Surgical History:  Procedure Laterality Date   IR THORACENTESIS ASP PLEURAL SPACE W/IMG GUIDE  04/05/2021   THORACENTESIS N/A 04/12/2021   Procedure: Mathews Robinsons;  Surgeon: [name], DO;  Location: [facility];  Service: Pulmonary;  Laterality: N/A;    HEMATOLOGY/ONCOLOGY HISTORY:  Oncology History  Renal cell carcinoma, left ([place])  05/09/2021 Initial Diagnosis   Renal cell carcinoma, left ([place])   07/24/2021 -  Chemotherapy   Patient is on Treatment Plan : RENAL CELL Nivolumab (480) q28d       ALLERGIES:  has No Known Allergies.  MEDICATIONS:  Current Outpatient Medications  Medication Sig Dispense Refill   acetaminophen (TYLENOL) 325 MG tablet Take 650 mg by mouth every 6 (six) hours as needed for headache.     Aflibercept (EYLEA IO) Inject 1 Dose into the eye every 30 (thirty) days. IO left  eye monthly     amLODipine (NORVASC) 10 MG tablet Take 10 mg by mouth daily.     benazepril (LOTENSIN) 20 MG tablet Take 20 mg by mouth daily.     cabozantinib (CABOMETYX) 20 MG tablet Take 1 tablet (20 mg total) by mouth daily. Take on an empty stomach, 1 hour before or 2 hours after meals. 30 tablet 2   celecoxib (CELEBREX) 100 MG capsule Take 1 capsule (100 mg total) by mouth 2 (two) times daily. 60 capsule 1   ferrous sulfate 325 (65 FE) MG EC tablet TAKE 1 TABLET BY MOUTH EVERY DAY WITH BREAKFAST 90 tablet 1   metoprolol tartrate (LOPRESSOR) 50 MG tablet Take 50 mg by mouth 2 (two) times daily.     mirtazapine (REMERON) 7.5 MG tablet TAKE 1 TABLET BY MOUTH EVERYDAY AT BEDTIME 90 tablet 1   Misc. Devices (WHEELCHAIR) MISC Light weight     OVER THE COUNTER MEDICATION Take 1 tablet by mouth daily as needed (for stomach discomfort). Allochol - (Herbal Supplement) Multivitamin with : Magnesium, potassium, garlic and charcoal. (Russian Medication)     pantoprazole (PROTONIX) 40 MG tablet TAKE 1 TABLET BY MOUTH EVERY DAY 30 tablet 1   prochlorperazine (COMPAZINE) 10 MG tablet Take 1 tablet (10 mg total) by mouth every 6 (six) hours as needed for nausea or vomiting. 30 tablet 2   simvastatin (ZOCOR) 20 MG tablet Take 20 mg by mouth every evening.     UNABLE TO FIND Take 1 tablet by mouth daily as needed (chest pain (angina)). Med  Name: Validol (Menthyl isovalerate and menthol) (Russian Medication)     Valerian Root 100 MG CAPS Take 1 capsule by mouth daily as needed (for anxiety).     No current facility-administered medications for this visit.    VITAL SIGNS: There were no vitals taken for this visit. There were no vitals filed for this visit.   Estimated body mass index is 18.95 kg/m as calculated from the following:   Height as of 04/30/22: 5\' 1"  (1.549 m).   Weight as of an earlier encounter on 05/28/22: 100 lb 4.8 oz (45.5 kg).  PERFORMANCE STATUS (ECOG) : 2 - Symptomatic, <50% confined  to bed  Physical Exam General: NAD Cardiovascular: RRR  Pulmonary:normal breathing pattern  Neurological: AAOx4 with a [place] interpreter  IMPRESSION:  I saw Ms. Stricker during her infusion today. [place] Interpretor present. No acute distress noted. Continues to do well. Continues to increase her activity. Shares she walked 3K steps on yesterday which she is proud of. Encouraged to continue remaining as active as possible to not only support her improvement of quality of life but for health reasons.   Neoplasm related pain She is only having occassional aches and pains. Not requiring any pain medications. Is actively taking Celebrex twice daily. Which helps when she has aches and pains. We will continue to support and follow. Encouraged we may be able to discontinue Celebrex down to once daily and then completely. She verbalized understanding.   We will continue to be available as needed. Patient knows to contact office with any symptom management needs.   Decreased appetite/weight loss  Appetite is good. Reports she is eating more and able to enjoy many different foods again. Her weight continues to increase. She is up to 100 lbs from 95lbs from 94 lbs last visit, 88.8 pounds. 90.2 lbs on 2/13, 97 lbs on 08/28/21, and 114 lbs on 06/12/21.  She is being followed by the dietitian.   Loose stools are much improved.   Ongoing discussions regarding eating some of her favorite foods, frequent snacking, small frequent meals daily versus trying to attempt large meals, and eating at the onset of hunger sensation of possible. She enjoys fruits, vegetables, and pastries.  Education provided on possibly adding protein powder to her fruits and making a smoothie.  She does not like the Ensure or boost reporting they are too sweet however is trying to drink them. Is concerned they may be contributing to her loose stools. We discussed identifying foods that she eat that may be causing increased stools  afterwards. She is lactose intolerant.   She also complains of dry mouth more specifically first in the morning and late evening. Education provided on hard candies to increase salivation in addition to the use of Biotene. This has improved.   I discussed the importance of continued conversation with family and their medical providers regarding overall plan of care and treatment options, ensuring decisions are within the context of the patients values and GOCs.  PLAN: Tylenol 1000mg  three times daily Continue Celebrex 200 mg daily.  Patient reports pain is improved.  Will decrease to once daily.  Biotene gum, mouthwash, toothpaste for dry mouth. Imodium for diarrhea  Weight has increased and appetite is much improved.  Education also provided on increased protein intake with recommendations of adding protein powder to her fruits making a smoothie given she is unable to tolerate Ensure/boost. I will plan to see patient back in 8 weeks in collaboration to other oncology appointments.  Patient expressed understanding and was in agreement with this plan. She also understands that She can call the clinic at any time with any questions, concerns, or complaints.   Time Total: 20 min   Visit consisted of counseling and education dealing with the complex and emotionally intense issues of symptom management and palliative care in the setting of serious and potentially life-threatening illness.Greater than 50%  of this time was spent counseling and coordinating care related to the above assessment and plan.  Alda Lea, AGPCNP-BC  Palliative Medicine Team/[facility]

## 2022-05-28 NOTE — Patient Instructions (Addendum)
?????????? ?? ???????: ?????????? ??? ?? ????? ??????????????? ??????   [place] ??? ???????? ??? ?????????????? ? ???????????????? ??????.  ???? ? ??? ????????? ???????????? ???????????? ? ?????????????? ??????, ???????? ??????????????? ? ?????????????? ????? ? ????????????????? ? ???? ???????????.  ?????? ??????? ?????? ? ??????, ?????????????? ?????? ?????? ? ????? ????? Portacath ??? PICC.  ?? ????????? ???????????? ??? ???????????? ???????????????? ? ????? ???????????. ????????, ??? ???????? ????????? ???????, ???? ?? ????????? (?? 15 ? ????? ?????). ????????? ?????? ?? ??? ? ?????? ?????????, ??????? ???????? ?? ????? ????? ??????. ????? ????, ???? ?? ?????????? ??? ??? ????? ???????, ?? ???????? ?? ???? ????, ??? ????? ??????? ?? ??????? ?? ?????????? ?????????? ??????????? ?????.   ??? ???????? ?? ?????????? ??????? ????????? ???? ?????? ????????? ? ????? ?????? ? ????????? 72 ???? ??? ?????????? ?????????? ???????.  ??????? ?? ???????? ????????? ???????????????????? ?/??? ????????????????????? ?????????: ?????????.  ????? ????????????? ??????? ? ????? ????? ???????, ?? ??????????? ??? ????????? ????????? ?? ??????? ? ???????????? ? ??????????.  ???? ????????? ????????, ? ??????? ??????? ???????? ??????????:  *????????? ???? 100,4 F (38 C) ??? ????.  *??????? ??? ????????  *??????? ? ?????, ??????? ?? ?????????????? ? ????? ?????????? ?? ???????  *????????? ??????  *????????? ?????? ??? ????????????  *???????? ? ??????????????? (???? ??? ?????? ??? ?????????????? ??? ?????? ??????????????)  * ???????? ????????? (????????? ??????, ?????, ???? ????? ?????)  ????????????? ?? ??? ? ????? ? ???????? ??? ??? ??? ??? (???? ? ?????, ???? ?? ??? ??? ?????? ????)  ????????? ????, ???? ??? ????.  ????????? ????????? ??? ??? ?????????  ??????? ?? ?????? * ????????? ?? ????????????? ???????????? ????????, ? ? ?????? ????????????? ?????-???? ??????? ?? ??????? ??????? ??? ????? ?????? ???  ?????????? ? ????????? ?????????? ??????.  ??????????, ???????? ???????? ? ??????????????? ? ???????????? ??? ???????? ? ??????????????? ?? ????????????? ??? ??????????? ? ????????? ?????????? ?????? ? ????????? ?? ??????????.  ???? ????? ?????? ? ??? ????????? ??????? ??? ??? ?????????? ???????? ??? ????????? ?????, ????????? ? ?????????? ??????????? ????????? [facility]: [phone] ? ???????? ??????????. ???? ?????? ?????: ? 8:00 ?? 16:30. ??????????? ???????. ???????? ????????, ??? ????????? ????????? ???????? ????? 16:00. ?? ????? ???? ????????? ?? ?????????? ???????? ???. ?? ??????? ?? ???????? ? ??????? ??????????. ? ??? ?????? ???? ?????? ? ????????? ??? ??????? ?????????? ????????. ?????????, ??????????, ?? ????????? ?????? ????????? ???????: [phone] ? ???????? ??????????.   ?? ????? ????????? ???????? ?? ????? ?????? ????????? ?? ????? ??????????? ????? ????? MyChart. ?????? ?? ?????????? ??????????? ?????? ??? ???? ??? ? ???????? 18 ??? ? ??????, ????? ??? ????? ??????-????????? ?????? ??? ????????? ?????????. ????????? ?????????? ????? ????? ?? ????? [email or web address]  ????? ???????? ?????????? MyChart! ??????? ? ??????? ??????????, ??????? MyChart, ???????? ??????????, ????????  ? ??????? ? ???????, ????????? ???? ??? ???????????? ? ?????? MyChart.  ? ?????????????? ??????? ????? ?? ???????????. ???? ?? ??????, ????? ????? ????? ??????????? ??????? ?????? ????? ?? ????? ????? ?? ????, ???????? ?? ?? ????. ?? ?????? ??? ????? ???????????? ???? ???????? ? ???????? ?? ????? 16 ???.

## 2022-05-30 LAB — T4: T4, Total: 7.7 ug/dL (ref 4.5–12.0)

## 2022-05-31 ENCOUNTER — Other Ambulatory Visit (HOSPITAL_COMMUNITY): Payer: Self-pay

## 2022-06-03 ENCOUNTER — Other Ambulatory Visit (HOSPITAL_COMMUNITY): Payer: Self-pay

## 2022-06-16 ENCOUNTER — Other Ambulatory Visit: Payer: Self-pay | Admitting: Internal Medicine

## 2022-06-17 ENCOUNTER — Encounter: Payer: Self-pay | Admitting: Internal Medicine

## 2022-06-17 ENCOUNTER — Other Ambulatory Visit (HOSPITAL_COMMUNITY): Payer: Self-pay

## 2022-06-23 NOTE — Progress Notes (Unsigned)
[name] OFFICE PROGRESS NOTE  [name], MD [place] [address] [place] [address]  DIAGNOSIS: Metastatic renal cell carcinoma presented with large left renal mass in addition to significant left hemothorax pleural-based metastasis as well as left hilar and mediastinal lymphadenopathy diagnosed in August 2022.   Biomarker Findings Microsatellite status - MS-Stable Tumor Mutational Burden - 2 Muts/Mb Genomic Findings For a complete list of the genes assayed, please refer to the Appendix. MTAP loss CDKN2A/B CDKN2A loss 8 Disease relevant genes with no reportable Alteratio   PD-L1 expression 0%  PRIOR THERAPY: Systemic immunotherapy with ipilimumab 1 mg/KG in addition to nivolumab 3 mg/KG every 3 weeks for 4 cycles followed by maintenance treatment with nivolumab.  Status post 3 cycles.  First dose started on 05/22/2021.  This treatment was discontinued secondary to disease progression.  CURRENT THERAPY: Opdivo 480 Mg IV every 4 weeks with Cabometyx 40 mg p.o. daily.  First dose August 21, 2021. Status post 12 cycles.  Cabometryx Dose Reduced to 40 Mg P.O. Daily Starting from May 2nd, 2023 due to intolerance due to diarrhea.  INTERVAL HISTORY: Gail Fitzgerald 87 y.o. female returns to the clinic today for a follow-up visit accompanied by her daughter in law and her [place] interpreter.  The patient is feeling well today without any concerning complaints except she has chronic left sided chest discomfort secondary to her extensive pleural disease. It is also exacerbated by taking a deep breath. She does take Tylenol PRN.  She continues to localize her pain in the bandlike region in the left ribs.  Her pain is exacerbated by laying on her left side. She had a scan last month which showed stable disease. When asked today if her pain is worsening compared to prior, she states she only feels the pain if she breaths too deeply, otherwise, denies pain. She denies  increased cough or dyspnea on exertion. At baseline, she reports she may have dyspnea if she climbs an incline.    The patient was last seen in clinic 4 weeks ago by Dr. Julien Nordmann and palliative care. The patient is currently undergoing infusions once a month with nivolumab which she tolerates well.  The patient is also on oral treatment with cabometryx.  This is dose reduced to 20 mg p.o. daily due to diarrhea. This dose was reduced in May 2023.  She is tolerating this better and denies recent diarrhea. She knows she can use Imodium if needed for diarrhea but reports that she has not needed to take any Imodium.  She initially was having decreased appetite and taste alterations.  She does not like the taste of boost and Ensure.  More recently her taste and appetite has improved.  She is using Biotene rinses. She was also followed by a member of the nutritionist team. I previously prescribed remeron, unclear if she is taking this as her son helps her with her medications and he was not at the appointment today. She denies any fever, chills, night sweats, or unexplained weight loss. She denies any nausea, vomiting, or constipation.  Denies any changes with her bladder habits.  Denies any rashes or skin changes.  She checks her BP twice a day at home. Today, it was 142/71 prior to coming to the clinic. She thinks she gets nervous when she comes to the clinic. She is here today for evaluation and repeat blood work before undergoing cycle #13 of nivolumab.   MEDICAL HISTORY: Past Medical History:  Diagnosis Date  Hypertension     ALLERGIES:  has No Known Allergies.  MEDICATIONS:  Current Outpatient Medications  Medication Sig Dispense Refill   acetaminophen (TYLENOL) 325 MG tablet Take 650 mg by mouth every 6 (six) hours as needed for headache.     Aflibercept (EYLEA IO) Inject 1 Dose into the eye every 30 (thirty) days. IO left eye monthly     amLODipine (NORVASC) 10 MG tablet Take 10 mg by mouth  daily.     benazepril (LOTENSIN) 20 MG tablet Take 20 mg by mouth daily.     cabozantinib (CABOMETYX) 20 MG tablet Take 1 tablet (20 mg total) by mouth daily. Take on an empty stomach, 1 hour before or 2 hours after meals. 30 tablet 2   celecoxib (CELEBREX) 100 MG capsule Take 1 capsule (100 mg total) by mouth 2 (two) times daily. 60 capsule 1   ferrous sulfate 325 (65 FE) MG EC tablet TAKE 1 TABLET BY MOUTH EVERY DAY WITH BREAKFAST 90 tablet 1   metoprolol tartrate (LOPRESSOR) 50 MG tablet Take 50 mg by mouth 2 (two) times daily.     mirtazapine (REMERON) 7.5 MG tablet TAKE 1 TABLET BY MOUTH EVERYDAY AT BEDTIME 90 tablet 1   Misc. Devices (WHEELCHAIR) MISC Light weight     OVER THE COUNTER MEDICATION Take 1 tablet by mouth daily as needed (for stomach discomfort). Allochol - (Herbal Supplement) Multivitamin with : Magnesium, potassium, garlic and charcoal. (Russian Medication)     pantoprazole (PROTONIX) 40 MG tablet TAKE 1 TABLET BY MOUTH EVERY DAY 90 tablet 1   prochlorperazine (COMPAZINE) 10 MG tablet Take 1 tablet (10 mg total) by mouth every 6 (six) hours as needed for nausea or vomiting. 30 tablet 2   simvastatin (ZOCOR) 20 MG tablet Take 20 mg by mouth every evening.     UNABLE TO FIND Take 1 tablet by mouth daily as needed (chest pain (angina)). Med Name: Validol (Menthyl isovalerate and menthol) (Russian Medication)     Valerian Root 100 MG CAPS Take 1 capsule by mouth daily as needed (for anxiety).     No current facility-administered medications for this visit.    SURGICAL HISTORY:  Past Surgical History:  Procedure Laterality Date   IR THORACENTESIS ASP PLEURAL SPACE W/IMG GUIDE  04/05/2021   THORACENTESIS N/A 04/12/2021   Procedure: Mathews Robinsons;  Surgeon: [name], DO;  Location: [facility];  Service: Pulmonary;  Laterality: N/A;    REVIEW OF SYSTEMS:   Review of Systems  Constitutional: Negative for appetite change (improving), chills, fatigue, fever and unexpected  weight change (improving).  HENT: Negative for mouth sores, nosebleeds, sore throat and trouble swallowing.   Eyes: Negative for eye problems and icterus.  Respiratory: Negative for cough, hemoptysis, shortness of breath (only if walking up an incline) and wheezing.   Cardiovascular: Positive for occasional radiating pain around her left lower rib cage with certain deep breaths or certain positions.  Negative for chest pain and leg swelling.  Gastrointestinal: Improving intermittent diarrhea.  Negative for abdominal pain, constipation, nausea and vomiting.  Genitourinary: Negative for bladder incontinence, difficulty urinating, dysuria, frequency and hematuria.   Musculoskeletal: Negative for back pain, gait problem, neck pain and neck stiffness.  Skin: Negative for itching and rash.  Neurological: Negative for dizziness, extremity weakness, gait problem, headaches, light-headedness and seizures.  Hematological: Negative for adenopathy. Does not bleed easily.  Positive for easy bruising on her upper extremities. Psychiatric/Behavioral: Negative for confusion, depression and sleep disturbance. The patient is  not nervous/anxious.     PHYSICAL EXAMINATION:  Blood pressure (!) 175/66, pulse 61, temperature 98.1 F (36.7 C), temperature source Oral, resp. rate 18, height '5\' 1"'  (1.549 m), weight 99 lb 3.2 oz (45 kg), SpO2 100 %.  ECOG PERFORMANCE STATUS: 1  Physical Exam  Constitutional: Oriented to person, place, and time and cachectic appearing female and in no distress.  HENT:  Head: Normocephalic and atraumatic.  Mouth/Throat: Oropharynx is clear and moist. No oropharyngeal exudate.  Eyes: Conjunctivae are normal. Right eye exhibits no discharge. Left eye exhibits no discharge. No scleral icterus.  Neck: Normal range of motion. Neck supple.  Cardiovascular: Normal rate, regular rhythm, normal heart sounds and intact distal pulses.   Pulmonary/Chest: Effort normal. Quiet breath sounds in  left lung. No respiratory distress. No wheezes. No rales.  Abdominal: Soft. Bowel sounds are normal. Exhibits no distension and no mass. There is no tenderness.  Musculoskeletal: Normal range of motion. Exhibits no edema.  Lymphadenopathy:    No cervical adenopathy.  Neurological: Alert and oriented to person, place, and time. Exhibits muscle wasting.  Gait normal. Coordination normal.  Skin: Skin is warm and dry. No rash noted. Not diaphoretic. No erythema. No pallor.  Psychiatric: Mood, memory and judgment normal.  Vitals reviewed.  LABORATORY DATA: Lab Results  Component Value Date   WBC 10.2 06/25/2022   HGB 12.2 06/25/2022   HCT 37.0 06/25/2022   MCV 93.2 06/25/2022   PLT 221 06/25/2022      Chemistry      Component Value Date/Time   NA 132 (L) 06/25/2022 1041   K 4.7 06/25/2022 1041   CL 98 06/25/2022 1041   CO2 27 06/25/2022 1041   BUN 18 06/25/2022 1041   CREATININE 0.69 06/25/2022 1041      Component Value Date/Time   CALCIUM 8.9 06/25/2022 1041   ALKPHOS 98 06/25/2022 1041   AST 16 06/25/2022 1041   ALT 7 06/25/2022 1041   BILITOT 0.4 06/25/2022 1041       RADIOGRAPHIC STUDIES:  No results found.   ASSESSMENT/PLAN:  This is a very pleasant 87 year old Caucasian female originally from [place].  She has been diagnosed with metastatic renal cell carcinoma.  She presented with a large left renal mass in addition to left hemithorax pleural-based metastasis as well as left hilar mediastinal lymphadenopathy.  She was diagnosed in 2022.  Her molecular studies show no actionable mutations and her PD-L1 expression is negative.  The patient is currently undergoing treatment with immunotherapy with ipilimumab 1 mg/kg as well as nivolumab 3 mg/kg IV every 3 weeks status post 4 cycles treatment was discontinued due to disease progression.   The patient had disease progression after cycle #4 with left-sided pleural tumor with similar size of the left renal mass.  Dr. Julien Nordmann  recommended adding Cabometyx 40 mg p.o. daily with her nivolumab IV every 4 weeks.  She started Cabometyx on 08/21/21.  She is status post 12 cycles.  Dr. Julien Nordmann reduce the dose to 20 mg p.o. daily at her appointment on 01/08/2022 due to intolerance with diarrhea.   Labs were reviewed.  Recommend that she continue on the same treatment at the same dose.  She will continue using Tylenol for pain control.  She has Celebrex if needed.  She will continue to use Imodium if needed for diarrhea.  Fortunately, appetite and diarrhea has improved.   The patient had questions about her scan from last month. I reviewed it continued to show the extensive  left pleural disease; however, this was unchanged, but it does explain her ongoing symptoms which I reminder her of the mechanism behind her symptoms. She denies changes in her breathing or pain. She wanted to know if the disease was progressive. Her scan from last month showed stable but extensive pleural disease. No significant effusion besides a small effusion.   Her BP was elevated today. She checks it routinely BID and states it is normal at home. She believes it is elevated in the clinic due to stress. She states her BP was 142/71 this morning. Rechecked in infusion which was 166/67. She will continue to monitor at home. Ok to proceed with BP today.    The patient was advised to call immediately if she has any concerning symptoms in the interval. The patient voices understanding of current disease status and treatment options and is in agreement with the current care plan. All questions were answered. The patient knows to call the clinic with any problems, questions or concerns. We can certainly see the patient much sooner if necessary         No orders of the defined types were placed in this encounter.    The total time spent in the appointment was 20-29 minutes.   Kassidi Elza L Josclyn Rosales, PA-C 06/25/22

## 2022-06-25 ENCOUNTER — Inpatient Hospital Stay: Payer: Medicaid Other | Admitting: Physician Assistant

## 2022-06-25 ENCOUNTER — Other Ambulatory Visit (HOSPITAL_COMMUNITY): Payer: Self-pay

## 2022-06-25 ENCOUNTER — Other Ambulatory Visit: Payer: Self-pay | Admitting: Internal Medicine

## 2022-06-25 ENCOUNTER — Inpatient Hospital Stay: Payer: Medicaid Other | Attending: Internal Medicine

## 2022-06-25 ENCOUNTER — Inpatient Hospital Stay (HOSPITAL_BASED_OUTPATIENT_CLINIC_OR_DEPARTMENT_OTHER): Payer: Medicaid Other | Admitting: Nurse Practitioner

## 2022-06-25 ENCOUNTER — Encounter: Payer: Self-pay | Admitting: Nurse Practitioner

## 2022-06-25 ENCOUNTER — Inpatient Hospital Stay: Payer: Medicaid Other

## 2022-06-25 VITALS — BP 166/66 | HR 61

## 2022-06-25 DIAGNOSIS — C642 Malignant neoplasm of left kidney, except renal pelvis: Secondary | ICD-10-CM | POA: Insufficient documentation

## 2022-06-25 DIAGNOSIS — R530 Neoplastic (malignant) related fatigue: Secondary | ICD-10-CM

## 2022-06-25 DIAGNOSIS — Z515 Encounter for palliative care: Secondary | ICD-10-CM

## 2022-06-25 DIAGNOSIS — Z79899 Other long term (current) drug therapy: Secondary | ICD-10-CM | POA: Insufficient documentation

## 2022-06-25 DIAGNOSIS — C782 Secondary malignant neoplasm of pleura: Secondary | ICD-10-CM | POA: Diagnosis not present

## 2022-06-25 DIAGNOSIS — Z5112 Encounter for antineoplastic immunotherapy: Secondary | ICD-10-CM | POA: Diagnosis present

## 2022-06-25 LAB — CBC WITH DIFFERENTIAL (CANCER CENTER ONLY)
Abs Immature Granulocytes: 0.03 10*3/uL (ref 0.00–0.07)
Basophils Absolute: 0 10*3/uL (ref 0.0–0.1)
Basophils Relative: 0 %
Eosinophils Absolute: 0.1 10*3/uL (ref 0.0–0.5)
Eosinophils Relative: 1 %
HCT: 37 % (ref 36.0–46.0)
Hemoglobin: 12.2 g/dL (ref 12.0–15.0)
Immature Granulocytes: 0 %
Lymphocytes Relative: 16 %
Lymphs Abs: 1.6 10*3/uL (ref 0.7–4.0)
MCH: 30.7 pg (ref 26.0–34.0)
MCHC: 33 g/dL (ref 30.0–36.0)
MCV: 93.2 fL (ref 80.0–100.0)
Monocytes Absolute: 1.2 10*3/uL — ABNORMAL HIGH (ref 0.1–1.0)
Monocytes Relative: 12 %
Neutro Abs: 7.2 10*3/uL (ref 1.7–7.7)
Neutrophils Relative %: 71 %
Platelet Count: 221 10*3/uL (ref 150–400)
RBC: 3.97 MIL/uL (ref 3.87–5.11)
RDW: 14.4 % (ref 11.5–15.5)
WBC Count: 10.2 10*3/uL (ref 4.0–10.5)
nRBC: 0 % (ref 0.0–0.2)

## 2022-06-25 LAB — CMP (CANCER CENTER ONLY)
ALT: 7 U/L (ref 0–44)
AST: 16 U/L (ref 15–41)
Albumin: 4 g/dL (ref 3.5–5.0)
Alkaline Phosphatase: 98 U/L (ref 38–126)
Anion gap: 7 (ref 5–15)
BUN: 18 mg/dL (ref 8–23)
CO2: 27 mmol/L (ref 22–32)
Calcium: 8.9 mg/dL (ref 8.9–10.3)
Chloride: 98 mmol/L (ref 98–111)
Creatinine: 0.69 mg/dL (ref 0.44–1.00)
GFR, Estimated: >60 mL/min (ref >60)
Glucose, Bld: 132 mg/dL — ABNORMAL HIGH (ref 70–99)
Potassium: 4.7 mmol/L (ref 3.5–5.1)
Sodium: 132 mmol/L — ABNORMAL LOW (ref 135–145)
Total Bilirubin: 0.4 mg/dL (ref 0.3–1.2)
Total Protein: 7.2 g/dL (ref 6.5–8.1)

## 2022-06-25 LAB — TSH: TSH: 2.428 u[IU]/mL (ref 0.350–4.500)

## 2022-06-25 MED ORDER — CELECOXIB 100 MG PO CAPS: 100.0000 mg | ORAL_CAPSULE | Freq: Every day | ORAL | 1 refills | Status: DC

## 2022-06-25 MED ORDER — SODIUM CHLORIDE 0.9 % IV SOLN
480.0000 mg | Freq: Once | INTRAVENOUS | Status: AC
  Administered 2022-06-25: 480 mg via INTRAVENOUS
  Filled 2022-06-25: qty 48

## 2022-06-25 MED ORDER — CABOMETYX 20 MG PO TABS
20.0000 mg | ORAL_TABLET | Freq: Every day | ORAL | 2 refills | Status: DC
  Filled 2022-06-25: qty 30, 30d supply, fill #0
  Filled 2022-07-22: qty 30, 30d supply, fill #1
  Filled 2022-08-14 – 2022-08-21 (×2): qty 30, 30d supply, fill #2

## 2022-06-25 MED ORDER — SODIUM CHLORIDE 0.9 % IV SOLN: Freq: Once | INTRAVENOUS | Status: AC

## 2022-06-25 NOTE — Progress Notes (Signed)
OK to treat today with elevated BP per Cassie PA

## 2022-06-25 NOTE — Addendum Note (Signed)
Addended by: Jimmy Footman on: 06/25/2022 02:48 PM   Modules accepted: Orders

## 2022-06-25 NOTE — Progress Notes (Addendum)
[facility]  Telephone:([phone]) [phone] Fax:(336) [phone]   Name: Gail Fitzgerald Date: 06/25/2022 MRN: [id number]  DOB: 1934-09-11  Gail Fitzgerald Care Team: [name], MD as PCP - General (Family Medicine)    REASON FOR CONSULTATION: Gail Fitzgerald is a 87 y.o. 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  Now actively receiving Opdivo and Cabometyx.  Palliative ask to see for symptom management and goals of care.    SOCIAL HISTORY:     reports that she has never smoked. She has never used smokeless tobacco. She reports that she does not currently use alcohol. She reports that she does not currently use drugs.  ADVANCE DIRECTIVES:  Gail Fitzgerald does not have advanced directives.  Does not wish to discuss.  CODE STATUS: Full code  PAST MEDICAL HISTORY: Past Medical History:  Diagnosis Date   Hypertension     PAST SURGICAL HISTORY:  Past Surgical History:  Procedure Laterality Date   IR THORACENTESIS ASP PLEURAL SPACE W/IMG GUIDE  04/05/2021   THORACENTESIS N/A 04/12/2021   Procedure: Mathews Robinsons;  Surgeon: [name], DO;  Location: [facility];  Service: Pulmonary;  Laterality: N/A;    HEMATOLOGY/ONCOLOGY HISTORY:  Oncology History  Renal cell carcinoma, left ([facility])  05/09/2021 Initial Diagnosis   Renal cell carcinoma, left ([facility])   07/24/2021 -  Chemotherapy   Gail Fitzgerald is on Treatment Plan : RENAL CELL Nivolumab ([phone] q28d       ALLERGIES:  has No Known Allergies.  MEDICATIONS:  Current Outpatient Medications  Medication Sig Dispense Refill   acetaminophen (TYLENOL) 325 MG tablet Take 650 mg by mouth every 6 (six) hours as needed for headache.     Aflibercept (EYLEA IO) Inject 1 Dose into the eye every 30 (thirty) days. IO left  eye monthly     amLODipine (NORVASC) 10 MG tablet Take 10 mg by mouth daily.     benazepril (LOTENSIN) 20 MG tablet Take 20 mg by mouth daily.     cabozantinib (CABOMETYX) 20 MG tablet Take 1 tablet (20 mg total) by mouth daily. Take on an empty stomach, 1 hour before or 2 hours after meals. 30 tablet 2   celecoxib (CELEBREX) 100 MG capsule Take 1 capsule (100 mg total) by mouth 2 (two) times daily. 60 capsule 1   ferrous sulfate 325 (65 FE) MG EC tablet TAKE 1 TABLET BY MOUTH EVERY DAY WITH BREAKFAST 90 tablet 1   metoprolol tartrate (LOPRESSOR) 50 MG tablet Take 50 mg by mouth 2 (two) times daily.     mirtazapine (REMERON) 7.5 MG tablet TAKE 1 TABLET BY MOUTH EVERYDAY AT BEDTIME 90 tablet 1   Misc. Devices (WHEELCHAIR) MISC Light weight     OVER THE COUNTER MEDICATION Take 1 tablet by mouth daily as needed (for stomach discomfort). Allochol - (Herbal Supplement) Multivitamin with : Magnesium, potassium, garlic and charcoal. (Russian Medication)     pantoprazole (PROTONIX) 40 MG tablet TAKE 1 TABLET BY MOUTH EVERY DAY 90 tablet 1   prochlorperazine (COMPAZINE) 10 MG tablet Take 1 tablet (10 mg total) by mouth every 6 (six) hours as needed for nausea or vomiting. 30 tablet 2   simvastatin (ZOCOR) 20 MG tablet Take 20 mg by mouth every evening.     UNABLE TO FIND Take 1 tablet by mouth daily as needed (chest pain (angina)). Med  Name: Validol (Menthyl isovalerate and menthol) (Russian Medication)     Valerian Root 100 MG CAPS Take 1 capsule by mouth daily as needed (for anxiety).     No current facility-administered medications for this visit.    VITAL SIGNS: There were no vitals taken for this visit. There were no vitals filed for this visit.   Estimated body mass index is 18.74 kg/m as calculated from the following:   Height as of an earlier encounter on 06/25/22: 5\' 1"  (1.549 m).   Weight as of an earlier encounter on 06/25/22: 99 lb 3.2 oz (45 kg).  PERFORMANCE STATUS (ECOG) : 2 -  Symptomatic, <50% confined to bed  Physical Exam General: NAD Cardiovascular: RRR  Pulmonary:normal breathing pattern  Neurological: AAOx4 with a [place] interpreter  IMPRESSION:  I saw Gail Fitzgerald during her infusion today. [place] Interpretor present. No acute distress noted. Continues to do well.  States over the past several days she has generalized aches and discomfort however feels that is improving.  Relates this to the change in temperature/weather.  She continues to be as active as possible.  Ambulating outside in the park weather permitting with her walker.  Neoplasm related pain She is only having occassional aches and pains. Not requiring any pain medications. Is actively taking Celebrex twice daily. Which helps when she has aches and pains. We will continue to support and follow.  We will continue to closely monitor and adjust medications as needed.  Tolerating Celebrex once daily.   Gail Fitzgerald knows to contact office with any symptom management needs.   Decreased appetite/weight loss  Appetite is good. Reports she is eating more and able to enjoy many different foods again. Her weight is stable at 99 pounds.  She shares she is unable to eat a lot of sweets as she has in the past due to alterations in taste.  She is being followed by the dietitian.   Denies constipation or diarrhea.  Previous discussions regarding eating some of her favorite foods, frequent snacking, small frequent meals daily versus trying to attempt large meals, and eating at the onset of hunger sensation of possible. She enjoys fruits, vegetables, and pastries.  Education provided on possibly adding protein powder to her fruits and making a smoothie.  She does not like the Ensure or boost reporting they are too sweet however is trying to drink them. Is concerned they may be contributing to her loose stools. We discussed identifying foods that she eat that may be causing increased stools afterwards. She is lactose  intolerant.   She also complains of dry mouth more specifically first in the morning and late evening. Education provided on hard candies to increase salivation in addition to the use of Biotene. This has improved.   I discussed the importance of continued conversation with family and their medical providers regarding overall plan of care and treatment options, ensuring decisions are within the context of the patients values and GOCs.  PLAN: Tylenol 1000mg  three times daily Celebrex 100 mg daily.   Biotene gum, mouthwash, toothpaste for dry mouth. Imodium for diarrhea  Weight remains stable and appetite is much improved.  Education also provided on increased protein intake with recommendations of adding protein powder to her fruits making a smoothie given she is unable to tolerate Ensure/boost. I will plan to see Gail Fitzgerald back in 8 weeks in collaboration to other oncology appointments.    Gail Fitzgerald expressed understanding and was in agreement with this plan. She also understands that She  can call the clinic at any time with any questions, concerns, or complaints.     Time Total: 20 min   Visit consisted of counseling and education dealing with the complex and emotionally intense issues of symptom management and palliative care in the setting of serious and potentially life-threatening illness.Greater than 50%  of this time was spent counseling and coordinating care related to the above assessment and plan.  Alda Lea, AGPCNP-BC  Palliative Medicine Team/[facility]

## 2022-06-25 NOTE — Patient Instructions (Signed)
[facility]  Discharge Instructions: Thank you for choosing [facility] to provide your oncology and hematology care.   If you have a lab appointment with the [facility], please go directly to the [facility] and check in at the registration area.   Wear comfortable clothing and clothing appropriate for easy access to any Portacath or PICC line.   We strive to give you quality time with your provider. You may need to reschedule your appointment if you arrive late (15 or more minutes).  Arriving late affects you and other patients whose appointments are after yours.  Also, if you miss three or more appointments without notifying the office, you may be dismissed from the clinic at the provider's discretion.      For prescription refill requests, have your pharmacy contact our office and allow 72 hours for refills to be completed.    Today you received the following chemotherapy and/or immunotherapy agent :Nivolumab (Opdivo) Injection     To help prevent nausea and vomiting after your treatment, we encourage you to take your nausea medication as directed.  BELOW ARE SYMPTOMS THAT SHOULD BE REPORTED IMMEDIATELY: *FEVER GREATER THAN 100.4 F (38 C) OR HIGHER *CHILLS OR SWEATING *NAUSEA AND VOMITING THAT IS NOT CONTROLLED WITH YOUR NAUSEA MEDICATION *UNUSUAL SHORTNESS OF BREATH *UNUSUAL BRUISING OR BLEEDING *URINARY PROBLEMS (pain or burning when urinating, or frequent urination) *BOWEL PROBLEMS (unusual diarrhea, constipation, pain near the anus) TENDERNESS IN MOUTH AND THROAT WITH OR WITHOUT PRESENCE OF ULCERS (sore throat, sores in mouth, or a toothache) UNUSUAL RASH, SWELLING OR PAIN  UNUSUAL VAGINAL DISCHARGE OR ITCHING   Items with * indicate a potential emergency and should be followed up as soon as possible or go to the Emergency Department if any problems should occur.  Please show the CHEMOTHERAPY ALERT CARD or IMMUNOTHERAPY ALERT  CARD at check-in to the Emergency Department and triage nurse.  Should you have questions after your visit or need to cancel or reschedule your appointment, please contact [facility]  Dept: [phone]  and follow the prompts.  Office hours are 8:00 a.m. to 4:30 p.m. Monday - Friday. Please note that voicemails left after 4:00 p.m. may not be returned until the following business day.  We are closed weekends and major holidays. You have access to a nurse at all times for urgent questions. Please call the main number to the clinic Dept: [phone] and follow the prompts.   For any non-urgent questions, you may also contact your provider using MyChart. We now offer e-Visits for anyone 64 and older to request care online for non-urgent symptoms. For details visit [email or web address].   Also download the MyChart app! Go to the app store, search "MyChart", open the app, select [facility], and log in with your MyChart username and password.  Masks are optional in the cancer centers. If you would like for your care team to wear a mask while they are taking care of you, please let them know. You may have one support person who is at least 85 years old accompany you for your appointments.  Nivolumab Injection ??? ???????????? ????? ??? ?????????? ????????? -- ??? ???????? ?????????????? ???????. ?? ??????????? ??? ??????? ????????? ??????????????? ????????. ? ?? ????? ?????? ??? ??????? ?????, ??? ?????? ? ???, ??????????? ???????, ??? ??????? ? ????????. ??? ????????? ????? ??????????? ? ?????? ?????; ???? ? ??? ????????? ???????, ?????????? ? ???????????? ????????? ??? ??????????. ???????????????? ????????? ???????? (????????): Opdivo ??? ??? ??????? ?????????? ???????????? ????????? ?? ?????? ?????? ????? ?????????? ?????????? ?????, ???? ?? ? ??? ???? ?? ????????? ?????????: ???????????? ???????????, ????? ??? ??????? ?????, ???????? ????? ???  ????????; ???????????? ??? ??????????????? ?????????? ?????????????? ????????? ?????? (????????? ????????? ?????? ??????? ????????); ????????? ??????? ??????? ??????? ? ???????; ?????????????? ???????; ???????????? ??????? ???????, ????? ??? ????????? ?????? ??? ??????? ??????-?????; ????????? ??? ????????????? ??????? ?? ?????????, ?????? ?????????, ??????? ????????, ????????? ??? ???????????; ???????????? ??? ???????????? ????????????; ????????? ??????. ??? ??? ??????? ????????? ??? ?????????? ??? ????????? ????????????? ??? ????????????? ????????. ??? ????????? ???????? ? ???????? ??? ???????????. ????? ??????? ??????? ??? ????? ????????????? ??????????? ?????????? ? ????????? (  MedGuide). ?????? ??? ??????? ??????????? ???????? ??? ??????????. ???????? ??????????? ?????????? ? ??????????? ?????????? ????? ????????? ?????. ???????? ?? ?? ??? ?? ???????????? ?????????? ??? ????? ????????? ????? ? 12 ???, ??????? ????????? ???? ????????????????. ?????????????: ???? ??? ???????, ??? ?? ????????? ?????????? ???? ????? ?????????, ?????????? ?????????? ? ????????????????? ????? ??? ???????? ????????? ??? ???????? ?????????? ??????. ??????????: ??? ????????? ????????????? ?????? ??? ???. ?? ???????? ?? ? ??????? ??????. ??? ?????????? ? ?????? ???????? ?????? ?????????? ?? ????????? ????????? ?? ????? ??? ???????? ??????????? ???. ????? ????????? ????????? ?????????. ???? ?? ?? ?????? ?????? ?? ????? ? ??????????? ?????, ???????? ?? ???? ??????????? ??????????. ? ??? ??? ????????? ????? ???????? ?? ??????????????? ?????????????? ?? ?????????. ???? ???????? ?? ???????? ???? ????????? ??????????????. ???????????? ???????????? ????????? ?????? ???? ??????????? ???? ????????, ????????????? ????????, ?????????????? ?????????? ? ??????? ???????. ????? ???????? ??? ? ???????, ???????????? ??????????? ???????? ??? ??????????. ????????? ???????? ????? ???????? ?? ?????????????? ? ??????????? ???? ??????????. ??  ??? ????? ???????? ???????? ??? ????????????? ????? ?????????? ?? ????? ?????? ????? ????????? ?? ?????? ??? ?????????? ??????????? ?????. ?? ????? ?????? ????? ????????? ??? ??????? ????????? ??????? ??????? ?????. ????????? ???????????? ?? ????? ?????? ????? ????????? ? ? ??????? 5 ??????? ????? ??? ???????????. ???????? ??????? ???????? ??????????? ?????????? ? ???????????? ???????????? ??? ????????????? ? ????????? ????????????. ?????????? ??????????? ??????????? ?????????????? ??????????? ????? ????????? ?? ????. ?????????? ? ??????????? ?????????? ?? ????? ????????? ???????????. ?? ????? ?????????? ????? ????????? ? ? ??????? 5 ??????? ????? ??? ??????????? ?????? ??????? ??????? ??????. ????? ???????? ??????? ????? ???? ??? ?????? ????? ?????????? ???????? ???????, ? ??????? ??????? ??? ????? ?????? ???????? ??????????? ??????????: ????????????? ??????? -- ?????? ????, ???, ??????????, ???? ????, ???, ????? ??? ?????; ??? ? ???????? ????? ??? ?????? ????????????? ???; ????????? ??????; ???? ? ?????; ??????; ????? ??????, ?????? ??? ???????????? ???????; ???? ? ?????. ????????? ??? ??????????? ????????????; ????????? ???????????, ?????; ??????? ??????? ?????? ? ????? (?????????????) -- ???????? ????? ??? ?????????? ?????? ?????????? ????, ????????? ???????? ??? ????????????, ?????????? ??????; ??????? ??????? ???????? ?????????? ?????? (???????????) -- ????????? ??? ??????????? ????????????, ???????? ????? ????, ?????????? ????????????? ??? ?????????? ???????????????? ? ?????, ?????? ??? ???????????, ???????, ???????????, ???????????? ????????????? ????? ??? ??????????? ?????????; ????????? ????? -- ?????????? ?????????? ?????????? ????, ????????? ???????, ?????? ??? ????; ??????????? ?????? -- ???? ? ?????? ??????????, ?????? ????????, ???????, ?????????????? ????, ???? ?????-??????? ??? ??????????? ?????, ?????????? ???? ??? ?????? ????, ????????? ???????? ??? ????????????; ?????? ???????  ??????????? -- ????????? ???????? ??? ????????????, ??????????????, ???????? ????, ???????????? ???????; ?????? ??????? ???????? ?????????? ?????? (??????????) -- ????????? ???????? ??? ????????????, ?????????? ???????????????? ? ??????, ?????, ????????? ?????, ??????? ????, ?????????? ????? ????, ??????????? ?????????; ????????? ?????????? ??? ????????? -- ??????????? ????????, ????????? ???????? ???????? ??? ??????? ???????, ?????????????????; ???? ? ?????? ??? ????????; ????, ??????????? ??? ???????? ? ?????? ??? ??????, ???????? ????????, ????????? ???????, ????????? ?????????? ??? ???????????. ???? ???????? ??? ?????-??????????? ?????; ??????????? ????, ??????????? ???????, ????????? ??? ?????? ???????????? ? ????????? ????, ? ??? ????? ????????? ?? ???. ???? ? ???????; ????????? ?????? ??? ????????????. ???????? ???????, ??????? ?????? ?? ??????? ???????????? ???????? (???????? ??????????? ??????????, ???? ??? ??????????? ??? ?????????? ??????????): ???? ? ??????; ?????; ?????? ????????; ???????; ?????????. ?????. ???? ???????? ?? ???????? ???? ????????? ???????? ????????. ?????????? ? ????? ?? ??????? ???????????? ???????? ????????. ?? ?????? ???????? ? ???????? ???????? ? FDA ?? ???????? [id number]. ??? ??????? ??????? ??? ?????????? ??? ????????? ???????? ?????? ??? ?????? ??????????? ?????????? ? ???????? ??? ???????. ??? ????????? ?? ???????? ??? ???????? ? ???????? ????????. ??????????: ???? ???????? ?? ???????? ???? ????????? ????????. ???? ? ??? ????????? ???????, ?????????? ??????? ?????????, ?????????? ? ?????, ?????????? ??? ??????? ???????????? ?????????.  2023 Elsevier/Gold Standard (2021-03-30 00:00:00)

## 2022-06-26 LAB — T4: T4, Total: 6.5 ug/dL (ref 4.5–12.0)

## 2022-06-27 ENCOUNTER — Other Ambulatory Visit (HOSPITAL_COMMUNITY): Payer: Self-pay

## 2022-07-22 ENCOUNTER — Other Ambulatory Visit (HOSPITAL_COMMUNITY): Payer: Self-pay

## 2022-07-23 ENCOUNTER — Inpatient Hospital Stay: Payer: Medicaid Other

## 2022-07-23 ENCOUNTER — Inpatient Hospital Stay: Payer: Medicaid Other | Attending: Internal Medicine

## 2022-07-23 ENCOUNTER — Inpatient Hospital Stay: Payer: Medicaid Other | Admitting: Internal Medicine

## 2022-07-23 ENCOUNTER — Other Ambulatory Visit (HOSPITAL_COMMUNITY): Payer: Self-pay

## 2022-07-23 DIAGNOSIS — D649 Anemia, unspecified: Secondary | ICD-10-CM | POA: Insufficient documentation

## 2022-07-23 DIAGNOSIS — Z79899 Other long term (current) drug therapy: Secondary | ICD-10-CM | POA: Insufficient documentation

## 2022-07-23 DIAGNOSIS — Z5112 Encounter for antineoplastic immunotherapy: Secondary | ICD-10-CM | POA: Insufficient documentation

## 2022-07-23 DIAGNOSIS — C782 Secondary malignant neoplasm of pleura: Secondary | ICD-10-CM | POA: Diagnosis not present

## 2022-07-23 DIAGNOSIS — C642 Malignant neoplasm of left kidney, except renal pelvis: Secondary | ICD-10-CM | POA: Insufficient documentation

## 2022-07-23 LAB — CBC WITH DIFFERENTIAL (CANCER CENTER ONLY)
Abs Immature Granulocytes: 0.02 10*3/uL (ref 0.00–0.07)
Basophils Absolute: 0 10*3/uL (ref 0.0–0.1)
Basophils Relative: 0 %
Eosinophils Absolute: 0.1 10*3/uL (ref 0.0–0.5)
Eosinophils Relative: 1 %
HCT: 35.6 % — ABNORMAL LOW (ref 36.0–46.0)
Hemoglobin: 11.7 g/dL — ABNORMAL LOW (ref 12.0–15.0)
Immature Granulocytes: 0 %
Lymphocytes Relative: 17 %
Lymphs Abs: 1.4 10*3/uL (ref 0.7–4.0)
MCH: 30.7 pg (ref 26.0–34.0)
MCHC: 32.9 g/dL (ref 30.0–36.0)
MCV: 93.4 fL (ref 80.0–100.0)
Monocytes Absolute: 1 10*3/uL (ref 0.1–1.0)
Monocytes Relative: 12 %
Neutro Abs: 5.8 10*3/uL (ref 1.7–7.7)
Neutrophils Relative %: 70 %
Platelet Count: 221 10*3/uL (ref 150–400)
RBC: 3.81 MIL/uL — ABNORMAL LOW (ref 3.87–5.11)
RDW: 14.6 % (ref 11.5–15.5)
WBC Count: 8.3 10*3/uL (ref 4.0–10.5)
nRBC: 0 % (ref 0.0–0.2)

## 2022-07-23 LAB — CMP (CANCER CENTER ONLY)
ALT: 9 U/L (ref 0–44)
AST: 19 U/L (ref 15–41)
Albumin: 4.1 g/dL (ref 3.5–5.0)
Alkaline Phosphatase: 90 U/L (ref 38–126)
Anion gap: 9 (ref 5–15)
BUN: 16 mg/dL (ref 8–23)
CO2: 25 mmol/L (ref 22–32)
Calcium: 8.9 mg/dL (ref 8.9–10.3)
Chloride: 98 mmol/L (ref 98–111)
Creatinine: 0.61 mg/dL (ref 0.44–1.00)
GFR, Estimated: >60 mL/min (ref >60)
Glucose, Bld: 142 mg/dL — ABNORMAL HIGH (ref 70–99)
Potassium: 4.3 mmol/L (ref 3.5–5.1)
Sodium: 132 mmol/L — ABNORMAL LOW (ref 135–145)
Total Bilirubin: 0.4 mg/dL (ref 0.3–1.2)
Total Protein: 7.2 g/dL (ref 6.5–8.1)

## 2022-07-23 LAB — TSH: TSH: 2.412 u[IU]/mL (ref 0.350–4.500)

## 2022-07-23 MED ORDER — SODIUM CHLORIDE 0.9 % IV SOLN
480.0000 mg | Freq: Once | INTRAVENOUS | Status: AC
  Administered 2022-07-23: 480 mg via INTRAVENOUS
  Filled 2022-07-23: qty 48

## 2022-07-23 MED ORDER — SODIUM CHLORIDE 0.9 % IV SOLN: Freq: Once | INTRAVENOUS | Status: AC

## 2022-07-23 NOTE — Progress Notes (Signed)
[facility] Telephone:([phone]) [phone]   Fax:(336) [phone]  OFFICE PROGRESS NOTE  [name], MD [place] [address] [place] [address]  DIAGNOSIS: Metastatic renal cell carcinoma presented with large left renal mass in addition to significant left hemothorax pleural-based metastasis as well as left hilar and mediastinal lymphadenopathy diagnosed in August 2022.  Biomarker Findings Microsatellite status - MS-Stable Tumor Mutational Burden - 2 Muts/Mb Genomic Findings For a complete list of the genes assayed, please refer to the Appendix. MTAP loss CDKN2A/B CDKN2A loss 8 Disease relevant genes with no reportable Alteratio  PD-L1 expression 0%  PRIOR THERAPY: Systemic immunotherapy with ipilimumab 1 mg/KG in addition to nivolumab 3 mg/KG every 3 weeks for 4 cycles followed by maintenance treatment with nivolumab.  Status post 3 cycles.  First dose started on 05/22/2021.  This treatment was discontinued secondary to disease progression.   CURRENT THERAPY: Opdivo 480 Mg IV every 4 weeks with Cabometyx 40 mg p.o. daily.  First dose August 21, 2021.  Status post 13 month of treatment.  Her dose of Cabometyx will be reduced to 20 mg p.o. daily starting 01/08/2022 secondary to persistent diarrhea.  INTERVAL HISTORY: Gail Fitzgerald 87 y.o. female returns to the clinic today for follow-up visit accompanied by her [place] interpreter [place].  The patient is feeling fine today with no concerning complaints except for intermittent pain on the left side of the chest likely consistent with her previous tumor in that area.  She also has some intermittent abdominal pain but no significant nausea, vomiting, diarrhea or constipation.  She has no shortness of breath, cough or hemoptysis.  She denied having any recent weight loss or night sweats.  She continues to tolerate her treatment with Opdivo and Cabometyx fairly well.  She is here today for evaluation before starting  cycle #14 of her treatment with nivolumab.   MEDICAL HISTORY: Past Medical History:  Diagnosis Date   Hypertension     ALLERGIES:  has No Known Allergies.  MEDICATIONS:  Current Outpatient Medications  Medication Sig Dispense Refill   acetaminophen (TYLENOL) 325 MG tablet Take 650 mg by mouth every 6 (six) hours as needed for headache.     Aflibercept (EYLEA IO) Inject 1 Dose into the eye every 30 (thirty) days. IO left eye monthly     amLODipine (NORVASC) 10 MG tablet Take 10 mg by mouth daily.     benazepril (LOTENSIN) 20 MG tablet Take 20 mg by mouth daily.     cabozantinib (CABOMETYX) 20 MG tablet Take 1 tablet (20 mg total) by mouth daily. Take on an empty stomach, 1 hour before or 2 hours after meals. 30 tablet 2   celecoxib (CELEBREX) 100 MG capsule Take 1 capsule (100 mg total) by mouth daily. 30 capsule 1   ferrous sulfate 325 (65 FE) MG EC tablet TAKE 1 TABLET BY MOUTH EVERY DAY WITH BREAKFAST 90 tablet 1   metoprolol tartrate (LOPRESSOR) 50 MG tablet Take 50 mg by mouth 2 (two) times daily.     mirtazapine (REMERON) 7.5 MG tablet TAKE 1 TABLET BY MOUTH EVERYDAY AT BEDTIME 90 tablet 1   Misc. Devices (WHEELCHAIR) MISC Light weight     OVER THE COUNTER MEDICATION Take 1 tablet by mouth daily as needed (for stomach discomfort). Allochol - (Herbal Supplement) Multivitamin with : Magnesium, potassium, garlic and charcoal. (Russian Medication)     pantoprazole (PROTONIX) 40 MG tablet TAKE 1 TABLET BY MOUTH EVERY DAY 90 tablet  1   prochlorperazine (COMPAZINE) 10 MG tablet Take 1 tablet (10 mg total) by mouth every 6 (six) hours as needed for nausea or vomiting. 30 tablet 2   simvastatin (ZOCOR) 20 MG tablet Take 20 mg by mouth every evening.     UNABLE TO FIND Take 1 tablet by mouth daily as needed (chest pain (angina)). Med Name: Validol (Menthyl isovalerate and menthol) (Russian Medication)     Valerian Root 100 MG CAPS Take 1 capsule by mouth daily as needed (for anxiety).      No current facility-administered medications for this visit.    SURGICAL HISTORY:  Past Surgical History:  Procedure Laterality Date   IR THORACENTESIS ASP PLEURAL SPACE W/IMG GUIDE  04/05/2021   THORACENTESIS N/A 04/12/2021   Procedure: Mathews Robinsons;  Surgeon: [name], DO;  Location: [facility];  Service: Pulmonary;  Laterality: N/A;    REVIEW OF SYSTEMS:  A comprehensive review of systems was negative except for: Constitutional: positive for fatigue Respiratory: positive for pleurisy/chest pain   PHYSICAL EXAMINATION: General appearance: alert, cooperative, fatigued, and no distress Head: Normocephalic, without obvious abnormality, atraumatic Neck: no adenopathy, no JVD, supple, symmetrical, trachea midline, and thyroid not enlarged, symmetric, no tenderness/mass/nodules Lymph nodes: Cervical, supraclavicular, and axillary nodes normal. Resp: clear to auscultation bilaterally Back: symmetric, no curvature. ROM normal. No CVA tenderness. Cardio: regular rate and rhythm, S1, S2 normal, no murmur, click, rub or gallop GI: soft, non-tender; bowel sounds normal; no masses,  no organomegaly Extremities: extremities normal, atraumatic, no cyanosis or edema  ECOG PERFORMANCE STATUS: 1 - Symptomatic but completely ambulatory  Blood pressure (!) 161/60, pulse 66, temperature 98.4 F (36.9 C), temperature source Oral, resp. rate 16, weight 100 lb 5 oz (45.5 kg), SpO2 98 %.  LABORATORY DATA: Lab Results  Component Value Date   WBC 10.2 06/25/2022   HGB 12.2 06/25/2022   HCT 37.0 06/25/2022   MCV 93.2 06/25/2022   PLT 221 06/25/2022      Chemistry      Component Value Date/Time   NA 132 (L) 06/25/2022 1041   K 4.7 06/25/2022 1041   CL 98 06/25/2022 1041   CO2 27 06/25/2022 1041   BUN 18 06/25/2022 1041   CREATININE 0.69 06/25/2022 1041      Component Value Date/Time   CALCIUM 8.9 06/25/2022 1041   ALKPHOS 98 06/25/2022 1041   AST 16 06/25/2022 1041   ALT 7  06/25/2022 1041   BILITOT 0.4 06/25/2022 1041       RADIOGRAPHIC STUDIES: No results found.  ASSESSMENT AND PLAN: This is a 87 years old white female who originally from [place] with metastatic renal cell carcinoma presented with large left renal mass in addition to left hemithorax pleural-based metastasis as well as left hilar and mediastinal lymphadenopathy diagnosed in 2022.  Her molecular studies showed no actionable mutations and PD-L1 expression was negative. The patient had MRI of the brain performed recently that showed no evidence of metastatic disease to the brain. She started systemic treatment with immunotherapy with ipilimumab 1 mg/KG as well as nivolumab 3 mg/KG every 3 weeks status post 4 cycles.  This treatment was discontinued secondary to disease progression. She is currently on treatment with a combination of nivolumab 480 Mg IV every 4 weeks in addition to Cabometyx 40 mg p.o. daily status post 13 cycles. Her dose of Cabometyx was reduced to 20 mg p.o. daily starting cycle #6 secondary to drug-induced diarrhea. She has been tolerating her treatment well with  no concerning adverse effects. I recommended for her to proceed with cycle #14 today as planned. I will see her back for follow-up visit in 4 weeks for evaluation with repeat CT scan of the chest, abdomen and pelvis for restaging of her disease. For the anemia, she will continue her current medication with oral ferrous sulfate. The patient was advised to call immediately if she has any other concerning symptoms in the interval. The patient voices understanding of current disease status and treatment options and is in agreement with the current care plan. The total time spent in the appointment was 20 minutes.  All questions were answered. The patient knows to call the clinic with any problems, questions or concerns. We can certainly see the patient much sooner if necessary.   Disclaimer: This note was dictated with  voice recognition software. Similar sounding words can inadvertently be transcribed and may not be corrected upon review.

## 2022-07-23 NOTE — Patient Instructions (Signed)
[facility]  Discharge Instructions: Thank you for choosing [facility] to provide your oncology and hematology care.   If you have a lab appointment with the [facility], please go directly to the [facility] and check in at the registration area.   Wear comfortable clothing and clothing appropriate for easy access to any Portacath or PICC line.   We strive to give you quality time with your provider. You may need to reschedule your appointment if you arrive late (15 or more minutes).  Arriving late affects you and other patients whose appointments are after yours.  Also, if you miss three or more appointments without notifying the office, you may be dismissed from the clinic at the provider's discretion.      For prescription refill requests, have your pharmacy contact our office and allow 72 hours for refills to be completed.    Today you received the following chemotherapy and/or immunotherapy agent :Nivolumab (Opdivo) Injection     To help prevent nausea and vomiting after your treatment, we encourage you to take your nausea medication as directed.  BELOW ARE SYMPTOMS THAT SHOULD BE REPORTED IMMEDIATELY: *FEVER GREATER THAN 100.4 F (38 C) OR HIGHER *CHILLS OR SWEATING *NAUSEA AND VOMITING THAT IS NOT CONTROLLED WITH YOUR NAUSEA MEDICATION *UNUSUAL SHORTNESS OF BREATH *UNUSUAL BRUISING OR BLEEDING *URINARY PROBLEMS (pain or burning when urinating, or frequent urination) *BOWEL PROBLEMS (unusual diarrhea, constipation, pain near the anus) TENDERNESS IN MOUTH AND THROAT WITH OR WITHOUT PRESENCE OF ULCERS (sore throat, sores in mouth, or a toothache) UNUSUAL RASH, SWELLING OR PAIN  UNUSUAL VAGINAL DISCHARGE OR ITCHING   Items with * indicate a potential emergency and should be followed up as soon as possible or go to the Emergency Department if any problems should occur.  Please show the CHEMOTHERAPY ALERT CARD or IMMUNOTHERAPY ALERT  CARD at check-in to the Emergency Department and triage nurse.  Should you have questions after your visit or need to cancel or reschedule your appointment, please contact [facility]  Dept: [phone]  and follow the prompts.  Office hours are 8:00 a.m. to 4:30 p.m. Monday - Friday. Please note that voicemails left after 4:00 p.m. may not be returned until the following business day.  We are closed weekends and major holidays. You have access to a nurse at all times for urgent questions. Please call the main number to the clinic Dept: [phone] and follow the prompts.   For any non-urgent questions, you may also contact your provider using MyChart. We now offer e-Visits for anyone 4 and older to request care online for non-urgent symptoms. For details visit [email or web address].   Also download the MyChart app! Go to the app store, search "MyChart", open the app, select [facility], and log in with your MyChart username and password.  Masks are optional in the cancer centers. If you would like for your care team to wear a mask while they are taking care of you, please let them know. You may have one support person who is at least 85 years old accompany you for your appointments.  Nivolumab Injection ??? ???????????? ????? ??? ?????????? ????????? -- ??? ???????? ?????????????? ???????. ?? ??????????? ??? ??????? ????????? ??????????????? ????????. ? ?? ????? ?????? ??? ??????? ?????, ??? ?????? ? ???, ??????????? ???????, ??? ??????? ? ????????. ??? ????????? ????? ??????????? ? ?????? ?????; ???? ? ??? ????????? ???????, ?????????? ? ???????????? ????????? ??? ??????????. ???????????????? ????????? ???????? (????????): Opdivo ??? ??? ??????? ?????????? ???????????? ????????? ?? ?????? ?????? ????? ?????????? ?????????? ?????, ???? ?? ? ??? ???? ?? ????????? ?????????: ???????????? ???????????, ????? ??? ??????? ?????, ???????? ????? ???  ????????; ???????????? ??? ??????????????? ?????????? ?????????????? ????????? ?????? (????????? ????????? ?????? ??????? ????????); ????????? ??????? ??????? ??????? ? ???????; ?????????????? ???????; ???????????? ??????? ???????, ????? ??? ????????? ?????? ??? ??????? ??????-?????; ????????? ??? ????????????? ??????? ?? ?????????, ?????? ?????????, ??????? ????????, ????????? ??? ???????????; ???????????? ??? ???????????? ????????????; ????????? ??????. ??? ??? ??????? ????????? ??? ?????????? ??? ????????? ????????????? ??? ????????????? ????????. ??? ????????? ???????? ? ???????? ??? ???????????. ????? ??????? ??????? ??? ????? ????????????? ??????????? ?????????? ? ????????? (  MedGuide). ?????? ??? ??????? ??????????? ???????? ??? ??????????. ???????? ??????????? ?????????? ? ??????????? ?????????? ????? ????????? ?????. ???????? ?? ?? ??? ?? ???????????? ?????????? ??? ????? ????????? ????? ? 12 ???, ??????? ????????? ???? ????????????????. ?????????????: ???? ??? ???????, ??? ?? ????????? ?????????? ???? ????? ?????????, ?????????? ?????????? ? ????????????????? ????? ??? ???????? ????????? ??? ???????? ?????????? ??????. ??????????: ??? ????????? ????????????? ?????? ??? ???. ?? ???????? ?? ? ??????? ??????. ??? ?????????? ? ?????? ???????? ?????? ?????????? ?? ????????? ????????? ?? ????? ??? ???????? ??????????? ???. ????? ????????? ????????? ?????????. ???? ?? ?? ?????? ?????? ?? ????? ? ??????????? ?????, ???????? ?? ???? ??????????? ??????????. ? ??? ??? ????????? ????? ???????? ?? ??????????????? ?????????????? ?? ?????????. ???? ???????? ?? ???????? ???? ????????? ??????????????. ???????????? ???????????? ????????? ?????? ???? ??????????? ???? ????????, ????????????? ????????, ?????????????? ?????????? ? ??????? ???????. ????? ???????? ??? ? ???????, ???????????? ??????????? ???????? ??? ??????????. ????????? ???????? ????? ???????? ?? ?????????????? ? ??????????? ???? ??????????. ??  ??? ????? ???????? ???????? ??? ????????????? ????? ?????????? ?? ????? ?????? ????? ????????? ?? ?????? ??? ?????????? ??????????? ?????. ?? ????? ?????? ????? ????????? ??? ??????? ????????? ??????? ??????? ?????. ????????? ???????????? ?? ????? ?????? ????? ????????? ? ? ??????? 5 ??????? ????? ??? ???????????. ???????? ??????? ???????? ??????????? ?????????? ? ???????????? ???????????? ??? ????????????? ? ????????? ????????????. ?????????? ??????????? ??????????? ?????????????? ??????????? ????? ????????? ?? ????. ?????????? ? ??????????? ?????????? ?? ????? ????????? ???????????. ?? ????? ?????????? ????? ????????? ? ? ??????? 5 ??????? ????? ??? ??????????? ?????? ??????? ??????? ??????. ????? ???????? ??????? ????? ???? ??? ?????? ????? ?????????? ???????? ???????, ? ??????? ??????? ??? ????? ?????? ???????? ??????????? ??????????: ????????????? ??????? -- ?????? ????, ???, ??????????, ???? ????, ???, ????? ??? ?????; ??? ? ???????? ????? ??? ?????? ????????????? ???; ????????? ??????; ???? ? ?????; ??????; ????? ??????, ?????? ??? ???????????? ???????; ???? ? ?????. ????????? ??? ??????????? ????????????; ????????? ???????????, ?????; ??????? ??????? ?????? ? ????? (?????????????) -- ???????? ????? ??? ?????????? ?????? ?????????? ????, ????????? ???????? ??? ????????????, ?????????? ??????; ??????? ??????? ???????? ?????????? ?????? (???????????) -- ????????? ??? ??????????? ????????????, ???????? ????? ????, ?????????? ????????????? ??? ?????????? ???????????????? ? ?????, ?????? ??? ???????????, ???????, ???????????, ???????????? ????????????? ????? ??? ??????????? ?????????; ????????? ????? -- ?????????? ?????????? ?????????? ????, ????????? ???????, ?????? ??? ????; ??????????? ?????? -- ???? ? ?????? ??????????, ?????? ????????, ???????, ?????????????? ????, ???? ?????-??????? ??? ??????????? ?????, ?????????? ???? ??? ?????? ????, ????????? ???????? ??? ????????????; ?????? ???????  ??????????? -- ????????? ???????? ??? ????????????, ??????????????, ???????? ????, ???????????? ???????; ?????? ??????? ???????? ?????????? ?????? (??????????) -- ????????? ???????? ??? ????????????, ?????????? ???????????????? ? ??????, ?????, ????????? ?????, ??????? ????, ?????????? ????? ????, ??????????? ?????????; ????????? ?????????? ??? ????????? -- ??????????? ????????, ????????? ???????? ???????? ??? ??????? ???????, ?????????????????; ???? ? ?????? ??? ????????; ????, ??????????? ??? ???????? ? ?????? ??? ??????, ???????? ????????, ????????? ???????, ????????? ?????????? ??? ???????????. ???? ???????? ??? ?????-??????????? ?????; ??????????? ????, ??????????? ???????, ????????? ??? ?????? ???????????? ? ????????? ????, ? ??? ????? ????????? ?? ???. ???? ? ???????; ????????? ?????? ??? ????????????. ???????? ???????, ??????? ?????? ?? ??????? ???????????? ???????? (???????? ??????????? ??????????, ???? ??? ??????????? ??? ?????????? ??????????): ???? ? ??????; ?????; ?????? ????????; ???????; ?????????. ?????. ???? ???????? ?? ???????? ???? ????????? ???????? ????????. ?????????? ? ????? ?? ??????? ???????????? ???????? ????????. ?? ?????? ???????? ? ???????? ???????? ? FDA ?? ???????? [phone]. ??? ??????? ??????? ??? ?????????? ??? ????????? ???????? ?????? ??? ?????? ??????????? ?????????? ? ???????? ??? ???????. ??? ????????? ?? ???????? ??? ???????? ? ???????? ????????. ??????????: ???? ???????? ?? ???????? ???? ????????? ????????. ???? ? ??? ????????? ???????, ?????????? ??????? ?????????, ?????????? ? ?????, ?????????? ??? ??????? ???????????? ?????????.  2023 Elsevier/Gold Standard (2021-03-30 00:00:00)

## 2022-07-24 LAB — T4: T4, Total: 6.4 ug/dL (ref 4.5–12.0)

## 2022-08-12 ENCOUNTER — Other Ambulatory Visit (HOSPITAL_COMMUNITY): Payer: Self-pay

## 2022-08-12 ENCOUNTER — Telehealth: Payer: Self-pay | Admitting: Internal Medicine

## 2022-08-12 NOTE — Telephone Encounter (Signed)
Called and spoke with patient's daughter in law regarding upcoming appointments, patient will be notified.

## 2022-08-14 ENCOUNTER — Other Ambulatory Visit (HOSPITAL_COMMUNITY): Payer: Self-pay

## 2022-08-14 ENCOUNTER — Other Ambulatory Visit: Payer: Self-pay

## 2022-08-14 ENCOUNTER — Ambulatory Visit (HOSPITAL_COMMUNITY)
Admission: RE | Admit: 2022-08-14 | Discharge: 2022-08-14 | Disposition: A | Discharge status: Home / Self Care | Payer: Medicaid Other | Source: Ambulatory Visit | Attending: Internal Medicine | Admitting: Internal Medicine

## 2022-08-14 DIAGNOSIS — C642 Malignant neoplasm of left kidney, except renal pelvis: Secondary | ICD-10-CM | POA: Insufficient documentation

## 2022-08-14 MED ORDER — IOHEXOL 300 MG/ML  SOLN
100.0000 mL | Freq: Once | INTRAMUSCULAR | Status: AC | PRN
  Administered 2022-08-14: 75 mL via INTRAVENOUS

## 2022-08-14 MED ORDER — IOHEXOL 9 MG/ML PO SOLN
ORAL | Status: AC
  Filled 2022-08-14: qty 1000

## 2022-08-14 MED ORDER — SODIUM CHLORIDE (PF) 0.9 % IJ SOLN
INTRAMUSCULAR | Status: AC
  Filled 2022-08-14: qty 50

## 2022-08-20 ENCOUNTER — Encounter: Payer: Self-pay | Admitting: Nurse Practitioner

## 2022-08-20 ENCOUNTER — Inpatient Hospital Stay (HOSPITAL_BASED_OUTPATIENT_CLINIC_OR_DEPARTMENT_OTHER): Payer: Medicaid Other | Admitting: Nurse Practitioner

## 2022-08-20 ENCOUNTER — Inpatient Hospital Stay (HOSPITAL_BASED_OUTPATIENT_CLINIC_OR_DEPARTMENT_OTHER): Payer: Medicaid Other | Admitting: Internal Medicine

## 2022-08-20 ENCOUNTER — Inpatient Hospital Stay: Payer: Medicaid Other | Attending: Internal Medicine

## 2022-08-20 ENCOUNTER — Inpatient Hospital Stay: Payer: Medicaid Other

## 2022-08-20 ENCOUNTER — Encounter: Payer: Self-pay | Admitting: Internal Medicine

## 2022-08-20 VITALS — BP 159/65 | HR 62 | Temp 97.9°F | Resp 15 | Wt 100.3 lb

## 2022-08-20 DIAGNOSIS — C642 Malignant neoplasm of left kidney, except renal pelvis: Secondary | ICD-10-CM | POA: Diagnosis present

## 2022-08-20 DIAGNOSIS — G893 Neoplasm related pain (acute) (chronic): Secondary | ICD-10-CM | POA: Diagnosis not present

## 2022-08-20 DIAGNOSIS — Z79899 Other long term (current) drug therapy: Secondary | ICD-10-CM | POA: Diagnosis not present

## 2022-08-20 DIAGNOSIS — R530 Neoplastic (malignant) related fatigue: Secondary | ICD-10-CM

## 2022-08-20 DIAGNOSIS — Z515 Encounter for palliative care: Secondary | ICD-10-CM

## 2022-08-20 DIAGNOSIS — Z5112 Encounter for antineoplastic immunotherapy: Secondary | ICD-10-CM | POA: Insufficient documentation

## 2022-08-20 DIAGNOSIS — Z7189 Other specified counseling: Secondary | ICD-10-CM | POA: Diagnosis not present

## 2022-08-20 LAB — CBC WITH DIFFERENTIAL (CANCER CENTER ONLY)
Abs Immature Granulocytes: 0.03 10*3/uL (ref 0.00–0.07)
Basophils Absolute: 0 10*3/uL (ref 0.0–0.1)
Basophils Relative: 0 %
Eosinophils Absolute: 0.1 10*3/uL (ref 0.0–0.5)
Eosinophils Relative: 1 %
HCT: 36 % (ref 36.0–46.0)
Hemoglobin: 11.8 g/dL — ABNORMAL LOW (ref 12.0–15.0)
Immature Granulocytes: 0 %
Lymphocytes Relative: 17 %
Lymphs Abs: 1.4 10*3/uL (ref 0.7–4.0)
MCH: 30.8 pg (ref 26.0–34.0)
MCHC: 32.8 g/dL (ref 30.0–36.0)
MCV: 94 fL (ref 80.0–100.0)
Monocytes Absolute: 1 10*3/uL (ref 0.1–1.0)
Monocytes Relative: 12 %
Neutro Abs: 5.7 10*3/uL (ref 1.7–7.7)
Neutrophils Relative %: 70 %
Platelet Count: 216 10*3/uL (ref 150–400)
RBC: 3.83 MIL/uL — ABNORMAL LOW (ref 3.87–5.11)
RDW: 14.6 % (ref 11.5–15.5)
WBC Count: 8.3 10*3/uL (ref 4.0–10.5)
nRBC: 0 % (ref 0.0–0.2)

## 2022-08-20 LAB — CMP (CANCER CENTER ONLY)
ALT: 8 U/L (ref 0–44)
AST: 17 U/L (ref 15–41)
Albumin: 3.8 g/dL (ref 3.5–5.0)
Alkaline Phosphatase: 83 U/L (ref 38–126)
Anion gap: 9 (ref 5–15)
BUN: 17 mg/dL (ref 8–23)
CO2: 24 mmol/L (ref 22–32)
Calcium: 9 mg/dL (ref 8.9–10.3)
Chloride: 98 mmol/L (ref 98–111)
Creatinine: 0.64 mg/dL (ref 0.44–1.00)
GFR, Estimated: >60 mL/min (ref >60)
Glucose, Bld: 151 mg/dL — ABNORMAL HIGH (ref 70–99)
Potassium: 4.3 mmol/L (ref 3.5–5.1)
Sodium: 131 mmol/L — ABNORMAL LOW (ref 135–145)
Total Bilirubin: 0.4 mg/dL (ref 0.3–1.2)
Total Protein: 6.5 g/dL (ref 6.5–8.1)

## 2022-08-20 LAB — TSH: TSH: 3.605 u[IU]/mL (ref 0.350–4.500)

## 2022-08-20 MED ORDER — PANTOPRAZOLE SODIUM 40 MG PO TBEC: 40.0000 mg | DELAYED_RELEASE_TABLET | Freq: Every day | ORAL | 1 refills | Status: DC

## 2022-08-20 MED ORDER — SODIUM CHLORIDE 0.9 % IV SOLN
480.0000 mg | Freq: Once | INTRAVENOUS | Status: AC
  Administered 2022-08-20: 480 mg via INTRAVENOUS
  Filled 2022-08-20: qty 48

## 2022-08-20 MED ORDER — SODIUM CHLORIDE 0.9 % IV SOLN: Freq: Once | INTRAVENOUS | Status: AC

## 2022-08-20 NOTE — Progress Notes (Signed)
[facility]  Telephone:([phone]) [phone] Fax:(336) [phone]   Name: Gail Fitzgerald Date: 08/20/2022 MRN: [id number]  DOB: [birth year]  Patient Care Team: [name], MD as PCP - General (Family Medicine)    REASON FOR CONSULTATION: Gail Fitzgerald is a 87 y.o. 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  Now actively receiving Opdivo and Cabometyx.  Palliative ask to see for symptom management and goals of care.    SOCIAL HISTORY:     reports that she has never smoked. She has never used smokeless tobacco. She reports that she does not currently use alcohol. She reports that she does not currently use drugs.  ADVANCE DIRECTIVES:  Patient does not have advanced directives.  Does not wish to discuss.  CODE STATUS: Full code  PAST MEDICAL HISTORY: Past Medical History:  Diagnosis Date   Hypertension     PAST SURGICAL HISTORY:  Past Surgical History:  Procedure Laterality Date   IR THORACENTESIS ASP PLEURAL SPACE W/IMG GUIDE  04/05/2021   THORACENTESIS N/A 04/12/2021   Procedure: [facility];  Surgeon: [name], DO;  Location: [facility];  Service: Pulmonary;  Laterality: N/A;    HEMATOLOGY/ONCOLOGY HISTORY:  Oncology History  Renal cell carcinoma, left ([place])  05/09/2021 Initial Diagnosis   Renal cell carcinoma, left ([place])   07/24/2021 -  Chemotherapy   Patient is on Treatment Plan : RENAL CELL Nivolumab (480) q28d       ALLERGIES:  has No Known Allergies.  MEDICATIONS:  Current Outpatient Medications  Medication Sig Dispense Refill   acetaminophen (TYLENOL) 325 MG tablet Take 650 mg by mouth every 6 (six) hours as needed for headache.     Aflibercept (EYLEA IO) Inject 1 Dose into the eye every 30 (thirty) days. IO left  eye monthly     amLODipine (NORVASC) 10 MG tablet Take 10 mg by mouth daily.     benazepril (LOTENSIN) 20 MG tablet Take 20 mg by mouth daily.     cabozantinib (CABOMETYX) 20 MG tablet Take 1 tablet (20 mg total) by mouth daily. Take on an empty stomach, 1 hour before or 2 hours after meals. 30 tablet 2   celecoxib (CELEBREX) 100 MG capsule Take 1 capsule (100 mg total) by mouth daily. 30 capsule 1   ferrous sulfate 325 (65 FE) MG EC tablet TAKE 1 TABLET BY MOUTH EVERY DAY WITH BREAKFAST 90 tablet 1   metoprolol tartrate (LOPRESSOR) 50 MG tablet Take 50 mg by mouth 2 (two) times daily.     mirtazapine (REMERON) 7.5 MG tablet TAKE 1 TABLET BY MOUTH EVERYDAY AT BEDTIME 90 tablet 1   Misc. Devices (WHEELCHAIR) MISC Light weight     OVER THE COUNTER MEDICATION Take 1 tablet by mouth daily as needed (for stomach discomfort). Allochol - (Herbal Supplement) Multivitamin with : Magnesium, potassium, garlic and charcoal. (Russian Medication)     pantoprazole (PROTONIX) 40 MG tablet TAKE 1 TABLET BY MOUTH EVERY DAY 90 tablet 1   prochlorperazine (COMPAZINE) 10 MG tablet Take 1 tablet (10 mg total) by mouth every 6 (six) hours as needed for nausea or vomiting. 30 tablet 2   simvastatin (ZOCOR) 20 MG tablet Take 20 mg by mouth every evening.     UNABLE TO FIND Take 1 tablet by mouth daily as needed (chest pain (angina)). Med Name: Validol [facility]  isovalerate and menthol) (Russian Medication)     Valerian Root 100 MG CAPS Take 1 capsule by mouth daily as needed (for anxiety).     No current facility-administered medications for this visit.    VITAL SIGNS: There were no vitals taken for this visit. There were no vitals filed for this visit.   Estimated body mass index is 18.95 kg/m as calculated from the following:   Height as of 06/25/22: 5\' 1"  (1.549 m).   Weight as of an earlier encounter on 08/20/22: 100 lb 4.8 oz (45.5 kg).  PERFORMANCE STATUS (ECOG) : 2 - Symptomatic, <50% confined to  bed  Physical Exam General: NAD, in wheelchair  Cardiovascular: RRR  Pulmonary:normal breathing pattern  Neurological: AAOx4 with a [place] interpreter  IMPRESSION:  Gail Fitzgerald presents to clinic today for follow-up. [place] Interpretor present. No acute distress noted. Continues to do well.  Is trying to remain as active as possible. Ambulating in her home with walker. Does walk outside some weather permitting. She shares excitement of celebrating her sons birthday on 12/25 and her culture's Christmas on 1/7.   Neoplasm related pain She is only having occassional aches and pains. Not requiring any pain medications. Is actively taking Celebrex twice daily. Which helps when she has aches and pains. We will continue to support and follow.  We will continue to closely monitor and adjust medications as needed.    Patient knows to contact office with any symptom management needs.   Decreased appetite/weight loss  Appetite is continues to be good. States she is not able to tolerate some dairy products but overall all appreciative she can eat the things she enjoys. Her weight is stable at 100lbs previously 99lbs. Actively followed by Dietitian.    Denies constipation or diarrhea.  Previous discussions regarding eating some of her favorite foods, frequent snacking, small frequent meals daily versus trying to attempt large meals, and eating at the onset of hunger sensation of possible. She enjoys fruits, vegetables, and pastries.  Education provided on possibly adding protein powder to her fruits and making a smoothie.  She does not like the Ensure or boost reporting they are too sweet however is trying to drink them. Is concerned they may be contributing to her loose stools. We discussed identifying foods that she eat that may be causing increased stools afterwards. She is lactose intolerant.   She also complains of dry mouth more specifically first in the morning and late evening. Education  provided on hard candies to increase salivation in addition to the use of Biotene. This has improved.   I discussed the importance of continued conversation with family and their medical providers regarding overall plan of care and treatment options, ensuring decisions are within the context of the patients values and GOCs.  PLAN: Tylenol 1000mg  twice daily as needed  Celebrex 100 mg daily.   Biotene gum, mouthwash, toothpaste for dry mouth. Appetite much improved. Weight stable.  I will plan to see patient back in 8 weeks in collaboration to other oncology appointments.    Patient expressed understanding and was in agreement with this plan. She also understands that She can call the clinic at any time with any questions, concerns, or complaints.      Time Total: 35 min   Visit consisted of counseling and education dealing with the complex and emotionally intense issues of symptom management and palliative care in the setting of serious and potentially life-threatening illness.Greater than 50%  of this time was spent  counseling and coordinating care related to the above assessment and plan.  Alda Lea, AGPCNP-BC  Palliative Medicine Team/[facility]

## 2022-08-20 NOTE — Patient Instructions (Signed)
[facility]  Discharge Instructions: Thank you for choosing [facility] to provide your oncology and hematology care.   If you have a lab appointment with the [facility], please go directly to the [facility] and check in at the registration area.   Wear comfortable clothing and clothing appropriate for easy access to any Portacath or PICC line.   We strive to give you quality time with your provider. You may need to reschedule your appointment if you arrive late (15 or more minutes).  Arriving late affects you and other patients whose appointments are after yours.  Also, if you miss three or more appointments without notifying the office, you may be dismissed from the clinic at the provider's discretion.      For prescription refill requests, have your pharmacy contact our office and allow 72 hours for refills to be completed.    Today you received the following chemotherapy and/or immunotherapy agent :Nivolumab (Opdivo) Injection     To help prevent nausea and vomiting after your treatment, we encourage you to take your nausea medication as directed.  BELOW ARE SYMPTOMS THAT SHOULD BE REPORTED IMMEDIATELY: *FEVER GREATER THAN 100.4 F (38 C) OR HIGHER *CHILLS OR SWEATING *NAUSEA AND VOMITING THAT IS NOT CONTROLLED WITH YOUR NAUSEA MEDICATION *UNUSUAL SHORTNESS OF BREATH *UNUSUAL BRUISING OR BLEEDING *URINARY PROBLEMS (pain or burning when urinating, or frequent urination) *BOWEL PROBLEMS (unusual diarrhea, constipation, pain near the anus) TENDERNESS IN MOUTH AND THROAT WITH OR WITHOUT PRESENCE OF ULCERS (sore throat, sores in mouth, or a toothache) UNUSUAL RASH, SWELLING OR PAIN  UNUSUAL VAGINAL DISCHARGE OR ITCHING   Items with * indicate a potential emergency and should be followed up as soon as possible or go to the Emergency Department if any problems should occur.  Please show the CHEMOTHERAPY ALERT CARD or IMMUNOTHERAPY ALERT  CARD at check-in to the Emergency Department and triage nurse.  Should you have questions after your visit or need to cancel or reschedule your appointment, please contact [facility]  Dept: [phone]  and follow the prompts.  Office hours are 8:00 a.m. to 4:30 p.m. Monday - Friday. Please note that voicemails left after 4:00 p.m. may not be returned until the following business day.  We are closed weekends and major holidays. You have access to a nurse at all times for urgent questions. Please call the main number to the clinic Dept: [phone] and follow the prompts.   For any non-urgent questions, you may also contact your provider using MyChart. We now offer e-Visits for anyone 32 and older to request care online for non-urgent symptoms. For details visit [email or web address].   Also download the MyChart app! Go to the app store, search "MyChart", open the app, select [facility], and log in with your MyChart username and password.  Masks are optional in the cancer centers. If you would like for your care team to wear a mask while they are taking care of you, please let them know. You may have one support person who is at least 85 years old accompany you for your appointments.  Nivolumab Injection ??? ???????????? ????? ??? ?????????? ????????? -- ??? ???????? ?????????????? ???????. ?? ??????????? ??? ??????? ????????? ??????????????? ????????. ? ?? ????? ?????? ??? ??????? ?????, ??? ?????? ? ???, ??????????? ???????, ??? ??????? ? ????????. ??? ????????? ????? ??????????? ? ?????? ?????; ???? ? ??? ????????? ???????, ?????????? ? ???????????? ????????? ??? ??????????. ???????????????? ????????? ???????? (????????): Opdivo ??? ??? ??????? ?????????? ???????????? ????????? ?? ?????? ?????? ????? ?????????? ?????????? ?????, ???? ?? ? ??? ???? ?? ????????? ?????????: ???????????? ???????????, ????? ??? ??????? ?????, ???????? ????? ???  ????????; ???????????? ??? ??????????????? ?????????? ?????????????? ????????? ?????? (????????? ????????? ?????? ??????? ????????); ????????? ??????? ??????? ??????? ? ???????; ?????????????? ???????; ???????????? ??????? ???????, ????? ??? ????????? ?????? ??? ??????? ??????-?????; ????????? ??? ????????????? ??????? ?? ?????????, ?????? ?????????, ??????? ????????, ????????? ??? ???????????; ???????????? ??? ???????????? ????????????; ????????? ??????. ??? ??? ??????? ????????? ??? ?????????? ??? ????????? ????????????? ??? ????????????? ????????. ??? ????????? ???????? ? ???????? ??? ???????????. ????? ??????? ??????? ??? ????? ????????????? ??????????? ?????????? ? ????????? (  MedGuide). ?????? ??? ??????? ??????????? ???????? ??? ??????????. ???????? ??????????? ?????????? ? ??????????? ?????????? ????? ????????? ?????. ???????? ?? ?? ??? ?? ???????????? ?????????? ??? ????? ????????? ????? ? 12 ???, ??????? ????????? ???? ????????????????. ?????????????: ???? ??? ???????, ??? ?? ????????? ?????????? ???? ????? ?????????, ?????????? ?????????? ? ????????????????? ????? ??? ???????? ????????? ??? ???????? ?????????? ??????. ??????????: ??? ????????? ????????????? ?????? ??? ???. ?? ???????? ?? ? ??????? ??????. ??? ?????????? ? ?????? ???????? ?????? ?????????? ?? ????????? ????????? ?? ????? ??? ???????? ??????????? ???. ????? ????????? ????????? ?????????. ???? ?? ?? ?????? ?????? ?? ????? ? ??????????? ?????, ???????? ?? ???? ??????????? ??????????. ? ??? ??? ????????? ????? ???????? ?? ??????????????? ?????????????? ?? ?????????. ???? ???????? ?? ???????? ???? ????????? ??????????????. ???????????? ???????????? ????????? ?????? ???? ??????????? ???? ????????, ????????????? ????????, ?????????????? ?????????? ? ??????? ???????. ????? ???????? ??? ? ???????, ???????????? ??????????? ???????? ??? ??????????. ????????? ???????? ????? ???????? ?? ?????????????? ? ??????????? ???? ??????????. ??  ??? ????? ???????? ???????? ??? ????????????? ????? ?????????? ?? ????? ?????? ????? ????????? ?? ?????? ??? ?????????? ??????????? ?????. ?? ????? ?????? ????? ????????? ??? ??????? ????????? ??????? ??????? ?????. ????????? ???????????? ?? ????? ?????? ????? ????????? ? ? ??????? 5 ??????? ????? ??? ???????????. ???????? ??????? ???????? ??????????? ?????????? ? ???????????? ???????????? ??? ????????????? ? ????????? ????????????. ?????????? ??????????? ??????????? ?????????????? ??????????? ????? ????????? ?? ????. ?????????? ? ??????????? ?????????? ?? ????? ????????? ???????????. ?? ????? ?????????? ????? ????????? ? ? ??????? 5 ??????? ????? ??? ??????????? ?????? ??????? ??????? ??????. ????? ???????? ??????? ????? ???? ??? ?????? ????? ?????????? ???????? ???????, ? ??????? ??????? ??? ????? ?????? ???????? ??????????? ??????????: ????????????? ??????? -- ?????? ????, ???, ??????????, ???? ????, ???, ????? ??? ?????; ??? ? ???????? ????? ??? ?????? ????????????? ???; ????????? ??????; ???? ? ?????; ??????; ????? ??????, ?????? ??? ???????????? ???????; ???? ? ?????. ????????? ??? ??????????? ????????????; ????????? ???????????, ?????; ??????? ??????? ?????? ? ????? (?????????????) -- ???????? ????? ??? ?????????? ?????? ?????????? ????, ????????? ???????? ??? ????????????, ?????????? ??????; ??????? ??????? ???????? ?????????? ?????? (???????????) -- ????????? ??? ??????????? ????????????, ???????? ????? ????, ?????????? ????????????? ??? ?????????? ???????????????? ? ?????, ?????? ??? ???????????, ???????, ???????????, ???????????? ????????????? ????? ??? ??????????? ?????????; ????????? ????? -- ?????????? ?????????? ?????????? ????, ????????? ???????, ?????? ??? ????; ??????????? ?????? -- ???? ? ?????? ??????????, ?????? ????????, ???????, ?????????????? ????, ???? ?????-??????? ??? ??????????? ?????, ?????????? ???? ??? ?????? ????, ????????? ???????? ??? ????????????; ?????? ???????  ??????????? -- ????????? ???????? ??? ????????????, ??????????????, ???????? ????, ???????????? ???????; ?????? ??????? ???????? ?????????? ?????? (??????????) -- ????????? ???????? ??? ????????????, ?????????? ???????????????? ? ??????, ?????, ????????? ?????, ??????? ????, ?????????? ????? ????, ??????????? ?????????; ????????? ?????????? ??? ????????? -- ??????????? ????????, ????????? ???????? ???????? ??? ??????? ???????, ?????????????????; ???? ? ?????? ??? ????????; ????, ??????????? ??? ???????? ? ?????? ??? ??????, ???????? ????????, ????????? ???????, ????????? ?????????? ??? ???????????. ???? ???????? ??? ?????-??????????? ?????; ??????????? ????, ??????????? ???????, ????????? ??? ?????? ???????????? ? ????????? ????, ? ??? ????? ????????? ?? ???. ???? ? ???????; ????????? ?????? ??? ????????????. ???????? ???????, ??????? ?????? ?? ??????? ???????????? ???????? (???????? ??????????? ??????????, ???? ??? ??????????? ??? ?????????? ??????????): ???? ? ??????; ?????; ?????? ????????; ???????; ?????????. ?????. ???? ???????? ?? ???????? ???? ????????? ???????? ????????. ?????????? ? ????? ?? ??????? ???????????? ???????? ????????. ?? ?????? ???????? ? ???????? ???????? ? FDA ?? ???????? [phone]. ??? ??????? ??????? ??? ?????????? ??? ????????? ???????? ?????? ??? ?????? ??????????? ?????????? ? ???????? ??? ???????. ??? ????????? ?? ???????? ??? ???????? ? ???????? ????????. ??????????: ???? ???????? ?? ???????? ???? ????????? ????????. ???? ? ??? ????????? ???????, ?????????? ??????? ?????????, ?????????? ? ?????, ?????????? ??? ??????? ???????????? ?????????.  2023 Elsevier/Gold Standard (2021-03-30 00:00:00)

## 2022-08-20 NOTE — Progress Notes (Signed)
[place] [phone]   [phone]  OFFICE PROGRESS NOTE  [name], MD [place] [address] [place] [address]  DIAGNOSIS: Metastatic renal cell carcinoma presented with large left renal mass in addition to significant left hemothorax pleural-based metastasis as well as left hilar and mediastinal lymphadenopathy diagnosed in August 2022.  Biomarker Findings Microsatellite status - MS-Stable Tumor Mutational Burden - 2 Muts/Mb Genomic Findings For a complete list of the genes assayed, please refer to the Appendix. MTAP loss CDKN2A/B CDKN2A loss 8 Disease relevant genes with no reportable Alteratio  PD-L1 expression 0%  PRIOR THERAPY: Systemic immunotherapy with ipilimumab 1 mg/KG in addition to nivolumab 3 mg/KG every 3 weeks for 4 cycles followed by maintenance treatment with nivolumab.  Status post 3 cycles.  First dose started on 05/22/2021.  This treatment was discontinued secondary to disease progression.   CURRENT THERAPY: Opdivo 480 Mg IV every 4 weeks with Cabometyx 40 mg p.o. daily.  First dose August 21, 2021.  Status post 14 month of treatment.  Her dose of Cabometyx will be reduced to 20 mg p.o. daily starting 01/08/2022 secondary to persistent diarrhea.  INTERVAL HISTORY: Gail Fitzgerald 87 y.o. female returns to the clinic today for follow-up visit accompanied by her daughter-in-law and her [place].  The patient is feeling fine today with no concerning complaints except for the intermittent pain on the left side of the chest as well as intermittent abdominal bloating and cramping.  She also has cramps in the lower extremities and she started taking magnesium supplements.  She denied having any current shortness of breath, cough or hemoptysis.  She has no nausea, vomiting, diarrhea or constipation.  She has no headache or visual changes.  She denied having any recent weight loss or night sweats.  She  had repeat CT scan of the chest, abdomen and pelvis performed recently and she is here for evaluation and discussion of her scan results before starting cycle #15.   MEDICAL HISTORY: Past Medical History:  Diagnosis Date   Hypertension     ALLERGIES:  has No Known Allergies.  MEDICATIONS:  Current Outpatient Medications  Medication Sig Dispense Refill   acetaminophen (TYLENOL) 325 MG tablet Take 650 mg by mouth every 6 (six) hours as needed for headache.     Aflibercept (EYLEA IO) Inject 1 Dose into the eye every 30 (thirty) days. IO left eye monthly     amLODipine (NORVASC) 10 MG tablet Take 10 mg by mouth daily.     benazepril (LOTENSIN) 20 MG tablet Take 20 mg by mouth daily.     cabozantinib (CABOMETYX) 20 MG tablet Take 1 tablet (20 mg total) by mouth daily. Take on an empty stomach, 1 hour before or 2 hours after meals. 30 tablet 2   celecoxib (CELEBREX) 100 MG capsule Take 1 capsule (100 mg total) by mouth daily. 30 capsule 1   ferrous sulfate 325 (65 FE) MG EC tablet TAKE 1 TABLET BY MOUTH EVERY DAY WITH BREAKFAST 90 tablet 1   metoprolol tartrate (LOPRESSOR) 50 MG tablet Take 50 mg by mouth 2 (two) times daily.     mirtazapine (REMERON) 7.5 MG tablet TAKE 1 TABLET BY MOUTH EVERYDAY AT BEDTIME 90 tablet 1   Misc. Devices (WHEELCHAIR) MISC Light weight     OVER THE COUNTER MEDICATION Take 1 tablet by mouth daily as needed (for stomach discomfort). Allochol - (Herbal Supplement) Multivitamin with : Magnesium, potassium, garlic  and charcoal. (Russian Medication)     pantoprazole (PROTONIX) 40 MG tablet TAKE 1 TABLET BY MOUTH EVERY DAY 90 tablet 1   prochlorperazine (COMPAZINE) 10 MG tablet Take 1 tablet (10 mg total) by mouth every 6 (six) hours as needed for nausea or vomiting. 30 tablet 2   simvastatin (ZOCOR) 20 MG tablet Take 20 mg by mouth every evening.     UNABLE TO FIND Take 1 tablet by mouth daily as needed (chest pain (angina)). Med Name: Validol (Menthyl isovalerate and  menthol) (Russian Medication)     Valerian Root 100 MG CAPS Take 1 capsule by mouth daily as needed (for anxiety).     No current facility-administered medications for this visit.    SURGICAL HISTORY:  Past Surgical History:  Procedure Laterality Date   IR THORACENTESIS ASP PLEURAL SPACE W/IMG GUIDE  04/05/2021   THORACENTESIS N/A 04/12/2021   Procedure: Mathews Robinsons;  Surgeon: [name], DO;  Location: [facility];  Service: Pulmonary;  Laterality: N/A;    REVIEW OF SYSTEMS:  Constitutional: negative Eyes: negative Ears, nose, mouth, throat, and face: negative Respiratory: positive for pleurisy/chest pain Cardiovascular: negative Gastrointestinal: positive for change in bowel habits Genitourinary:negative Integument/breast: negative Hematologic/lymphatic: negative Musculoskeletal:negative Neurological: negative Behavioral/Psych: negative Endocrine: negative Allergic/Immunologic: negative   PHYSICAL EXAMINATION: General appearance: alert, cooperative, fatigued, and no distress Head: Normocephalic, without obvious abnormality, atraumatic Neck: no adenopathy, no JVD, supple, symmetrical, trachea midline, and thyroid not enlarged, symmetric, no tenderness/mass/nodules Lymph nodes: Cervical, supraclavicular, and axillary nodes normal. Resp: clear to auscultation bilaterally Back: symmetric, no curvature. ROM normal. No CVA tenderness. Cardio: regular rate and rhythm, S1, S2 normal, no murmur, click, rub or gallop GI: soft, non-tender; bowel sounds normal; no masses,  no organomegaly Extremities: extremities normal, atraumatic, no cyanosis or edema Neurologic: Alert and oriented X 3, normal strength and tone. Normal symmetric reflexes. Normal coordination and gait  ECOG PERFORMANCE STATUS: 1 - Symptomatic but completely ambulatory  Blood pressure (!) 159/65, pulse 62, temperature 97.9 F (36.6 C), temperature source Oral, resp. rate 15, weight 100 lb 4.8 oz (45.5 kg), SpO2  98 %.  LABORATORY DATA: Lab Results  Component Value Date   WBC 8.3 08/20/2022   HGB 11.8 (L) 08/20/2022   HCT 36.0 08/20/2022   MCV 94.0 08/20/2022   PLT 216 08/20/2022      Chemistry      Component Value Date/Time   NA 131 (L) 08/20/2022 0905   K 4.3 08/20/2022 0905   CL 98 08/20/2022 0905   CO2 24 08/20/2022 0905   BUN 17 08/20/2022 0905   CREATININE 0.64 08/20/2022 0905      Component Value Date/Time   CALCIUM 9.0 08/20/2022 0905   ALKPHOS 83 08/20/2022 0905   AST 17 08/20/2022 0905   ALT 8 08/20/2022 0905   BILITOT 0.4 08/20/2022 0905       RADIOGRAPHIC STUDIES: CT Chest W Contrast  Result Date: 08/15/2022 CLINICAL DATA:  Renal cell carcinoma with left pleural metastasis. Diagnosed in 2022. Weight loss and anemia. On immunotherapy. * Tracking Code: BO * EXAM: CT CHEST ABDOMEN PELVIS  WITH CONTRAST TECHNIQUE: Multidetector CT imaging of the chest and abdomen pelvis was performed following the standard protocol during bolus administration of intravenous contrast. RADIATION DOSE REDUCTION: This exam was performed according to the departmental dose-optimization program which includes automated exposure control, adjustment of the mA and/or kV according to patient size and/or use of iterative reconstruction technique. CONTRAST:  47m OMNIPAQUE IOHEXOL 300 MG/ML  SOLN  COMPARISON:  05/23/2022 FINDINGS: CT CHEST FINDINGS Cardiovascular: Advanced aortic and branch vessel atherosclerosis. Upper normal ascending aortic caliber, 3.9 cm. Normal heart size, without pericardial effusion. Multivessel coronary artery atherosclerosis. No central pulmonary embolism, on this non-dedicated study. Mediastinum/Nodes: No supraclavicular adenopathy. Left-sided 8 mm thyroid nodule Not clinically significant; no follow-up imaging recommended (ref: J Am Coll Radiol. 2015 Feb;12(2): 143-50) No mediastinal or hilar adenopathy. Tiny hiatal hernia. Lungs/Pleura: Circumferential large volume left-sided pleural  metastasis again identified. Example thickness superiorly and posteriorly of 3.7 cm on 19/2, similar to on the prior exam (when remeasured). At the inferior medial left hemithorax, where measured on the prior exam, 3.7 cm today on 74/2, similar. No pulmonary parenchymal metastasis identified. Musculoskeletal: Osteopenia. CT ABDOMEN PELVIS FINDINGS Hepatobiliary: Normal liver. Gallstones up to 1.6 cm. No acute cholecystitis or biliary duct dilatation. Pancreas: Normal, without mass or ductal dilatation. Spleen: Normal in size, without focal abnormality. Adrenals/Urinary Tract: Mild bilateral adrenal thickening. Bilateral renal too small to characterize lesions. A minimally complex cyst in the upper pole left kidney measures 3.8 cm . In the absence of clinically indicated signs/symptoms require(s) no independent follow-up. Punctate lower pole right renal collecting system calculus. A bilobed inter/lower pole left renal lesion is again identified. The cephalad portion measures 2.5 x 2.5 cm on 52/12 and is similar to on the prior. The more caudal portion measures 2.9 x 2.5 cm on 57/12 versus similar at 3.1 x 2.4 cm on the prior exam Normal urinary bladder. Stomach/Bowel: Proximal gastric underdistention. Normal colon and terminal ileum. Normal small bowel. Vascular/Lymphatic: Advanced aortic and branch vessel atherosclerosis. Patent renal veins No abdominopelvic adenopathy. Reproductive: Central uterine hyperenhancing 1.0 cm lesion on 95/12. No adnexal mass. Other: No significant free fluid. Moderate pelvic floor laxity. No free intraperitoneal air. Musculoskeletal: Osteopenia. Presumed bone island within the right iliac at less than 1 cm. L1 moderate compression deformity is not significantly changed, without ventral canal encroachment. Mild L4 compression deformity is also similar. IMPRESSION: CT CHEST IMPRESSION 1. Similar large volume left-sided circumferential pleural metastasis. 2. No new or progressive disease  within the thorax. 3.  Tiny hiatal hernia. 4. Coronary artery atherosclerosis. Aortic Atherosclerosis (ICD10-I70.0). CT ABDOMEN AND PELVIS IMPRESSION 1. Similar size of a bilobed left renal cell carcinoma. 2. No evidence of metastatic disease within the abdomen or pelvis. 3. Hyperenhancing central uterine nodule is indeterminate, most likely a submucosal fibroid. An endometrial polyp could look similar. Similar on 08/10/2021. This is of doubtful clinical significance given comorbidities. This could be correlated with postmenopausal bleeding. 4. Cholelithiasis 5. Right nephrolithiasis 6. Osteopenia with chronic lumbar compression deformities. Electronically Signed   By: Abigail Miyamoto M.D.   On: 08/15/2022 10:39   CT Abdomen Pelvis W Contrast  Result Date: 08/15/2022 CLINICAL DATA:  Renal cell carcinoma with left pleural metastasis. Diagnosed in 2022. Weight loss and anemia. On immunotherapy. * Tracking Code: BO * EXAM: CT CHEST ABDOMEN PELVIS  WITH CONTRAST TECHNIQUE: Multidetector CT imaging of the chest and abdomen pelvis was performed following the standard protocol during bolus administration of intravenous contrast. RADIATION DOSE REDUCTION: This exam was performed according to the departmental dose-optimization program which includes automated exposure control, adjustment of the mA and/or kV according to patient size and/or use of iterative reconstruction technique. CONTRAST:  37m OMNIPAQUE IOHEXOL 300 MG/ML  SOLN COMPARISON:  05/23/2022 FINDINGS: CT CHEST FINDINGS Cardiovascular: Advanced aortic and branch vessel atherosclerosis. Upper normal ascending aortic caliber, 3.9 cm. Normal heart size, without pericardial effusion. Multivessel coronary artery atherosclerosis. No  central pulmonary embolism, on this non-dedicated study. Mediastinum/Nodes: No supraclavicular adenopathy. Left-sided 8 mm thyroid nodule Not clinically significant; no follow-up imaging recommended (ref: J Am Coll Radiol. 2015 Feb;12(2):  143-50) No mediastinal or hilar adenopathy. Tiny hiatal hernia. Lungs/Pleura: Circumferential large volume left-sided pleural metastasis again identified. Example thickness superiorly and posteriorly of 3.7 cm on 19/2, similar to on the prior exam (when remeasured). At the inferior medial left hemithorax, where measured on the prior exam, 3.7 cm today on 74/2, similar. No pulmonary parenchymal metastasis identified. Musculoskeletal: Osteopenia. CT ABDOMEN PELVIS FINDINGS Hepatobiliary: Normal liver. Gallstones up to 1.6 cm. No acute cholecystitis or biliary duct dilatation. Pancreas: Normal, without mass or ductal dilatation. Spleen: Normal in size, without focal abnormality. Adrenals/Urinary Tract: Mild bilateral adrenal thickening. Bilateral renal too small to characterize lesions. A minimally complex cyst in the upper pole left kidney measures 3.8 cm . In the absence of clinically indicated signs/symptoms require(s) no independent follow-up. Punctate lower pole right renal collecting system calculus. A bilobed inter/lower pole left renal lesion is again identified. The cephalad portion measures 2.5 x 2.5 cm on 52/12 and is similar to on the prior. The more caudal portion measures 2.9 x 2.5 cm on 57/12 versus similar at 3.1 x 2.4 cm on the prior exam Normal urinary bladder. Stomach/Bowel: Proximal gastric underdistention. Normal colon and terminal ileum. Normal small bowel. Vascular/Lymphatic: Advanced aortic and branch vessel atherosclerosis. Patent renal veins No abdominopelvic adenopathy. Reproductive: Central uterine hyperenhancing 1.0 cm lesion on 95/12. No adnexal mass. Other: No significant free fluid. Moderate pelvic floor laxity. No free intraperitoneal air. Musculoskeletal: Osteopenia. Presumed bone island within the right iliac at less than 1 cm. L1 moderate compression deformity is not significantly changed, without ventral canal encroachment. Mild L4 compression deformity is also similar. IMPRESSION:  CT CHEST IMPRESSION 1. Similar large volume left-sided circumferential pleural metastasis. 2. No new or progressive disease within the thorax. 3.  Tiny hiatal hernia. 4. Coronary artery atherosclerosis. Aortic Atherosclerosis (ICD10-I70.0). CT ABDOMEN AND PELVIS IMPRESSION 1. Similar size of a bilobed left renal cell carcinoma. 2. No evidence of metastatic disease within the abdomen or pelvis. 3. Hyperenhancing central uterine nodule is indeterminate, most likely a submucosal fibroid. An endometrial polyp could look similar. Similar on 08/10/2021. This is of doubtful clinical significance given comorbidities. This could be correlated with postmenopausal bleeding. 4. Cholelithiasis 5. Right nephrolithiasis 6. Osteopenia with chronic lumbar compression deformities. Electronically Signed   By: Abigail Miyamoto M.D.   On: 08/15/2022 10:39    ASSESSMENT AND PLAN: This is a 87 years old white female who originally from [place] with metastatic renal cell carcinoma presented with large left renal mass in addition to left hemithorax pleural-based metastasis as well as left hilar and mediastinal lymphadenopathy diagnosed in 2022.  Her molecular studies showed no actionable mutations and PD-L1 expression was negative. The patient had MRI of the brain performed recently that showed no evidence of metastatic disease to the brain. She started systemic treatment with immunotherapy with ipilimumab 1 mg/KG as well as nivolumab 3 mg/KG every 3 weeks status post 4 cycles.  This treatment was discontinued secondary to disease progression. She is currently on treatment with a combination of nivolumab 480 Mg IV every 4 weeks in addition to Cabometyx 40 mg p.o. daily status post 14 cycles. Her dose of Cabometyx was reduced to 20 mg p.o. daily starting cycle #6 secondary to drug-induced diarrhea. The patient has been tolerating this treatment well with no concerning adverse effects. She had  repeat CT scan of the chest, abdomen and  pelvis performed recently.  I personally and independently reviewed the scan images and discussed the result with the patient and her family. Her scan showed no concerning findings for disease progression. I recommended for the patient to continue her current treatment with nivolumab 480 Mg IV every 4 weeks in addition to Cabometyx 20 mg p.o. daily. She will come back for follow-up visit in 4 weeks for evaluation before the next cycle of her treatment. For the anemia, she will continue on the over-the-counter ferrous sulfate. The patient was advised to call immediately if she has any other concerning symptoms in the interval.  She will come back for follow-up visit in 4 weeks for evaluation before the next cycle of her treatment. The patient was advised to call immediately if she has any concerning symptoms in the interval. The patient voices understanding of current disease status and treatment options and is in agreement with the current care plan. The total time spent in the appointment was 30 minutes.  All questions were answered. The patient knows to call the clinic with any problems, questions or concerns. We can certainly see the patient much sooner if necessary.   Disclaimer: This note was dictated with voice recognition software. Similar sounding words can inadvertently be transcribed and may not be corrected upon review.

## 2022-08-21 ENCOUNTER — Other Ambulatory Visit (HOSPITAL_COMMUNITY): Payer: Self-pay

## 2022-08-21 ENCOUNTER — Other Ambulatory Visit: Payer: Self-pay

## 2022-08-22 LAB — T4: T4, Total: 7.3 ug/dL (ref 4.5–12.0)

## 2022-08-23 ENCOUNTER — Other Ambulatory Visit: Payer: Self-pay

## 2022-08-26 ENCOUNTER — Other Ambulatory Visit (HOSPITAL_COMMUNITY): Payer: Self-pay

## 2022-09-07 ENCOUNTER — Other Ambulatory Visit: Payer: Self-pay | Admitting: Physician Assistant

## 2022-09-07 DIAGNOSIS — R630 Anorexia: Secondary | ICD-10-CM

## 2022-09-14 NOTE — Progress Notes (Unsigned)
[facility] OFFICE PROGRESS NOTE  [name], MD [place] [address] [place] [id number]  DIAGNOSIS: Metastatic renal cell carcinoma presented with large left renal mass in addition to significant left hemothorax pleural-based metastasis as well as left hilar and mediastinal lymphadenopathy diagnosed in August 2022.   Biomarker Findings Microsatellite status - MS-Stable Tumor Mutational Burden - 2 Muts/Mb Genomic Findings For a complete list of the genes assayed, please refer to the Appendix. MTAP loss CDKN2A/B CDKN2A loss 8 Disease relevant genes with no reportable Alteratio   PD-L1 expression 0%  PRIOR THERAPY: Systemic immunotherapy with ipilimumab 1 mg/KG in addition to nivolumab 3 mg/KG every 3 weeks for 4 cycles followed by maintenance treatment with nivolumab.  Status post 3 cycles.  First dose started on 05/22/2021.  This treatment was discontinued secondary to disease progression   CURRENT THERAPY: Opdivo 480 Mg IV every 4 weeks with Cabometyx 40 mg p.o. daily.  First dose August 21, 2021. Status post 15 cycles.  Cabometryx Dose Reduced to 40 Mg P.O. Daily Starting from May 2nd, 2023 due to intolerance due to diarrhea.   INTERVAL HISTORY: Gail Fitzgerald 88 y.o. female returns to the clinic today for a follow-up visit accompanied by her son.  The patient is feeling well today without any new concerning complaints. According to her and her son, everything is the same. She has chronic left sided chest discomfort secondary to her extensive pleural disease. It is also exacerbated by taking a deep breath. She does take Tylenol PRN. She is also prescribed celebrex. She is needing a refill. She follows with palliative care and is scheduled to see them in the infusion room today.  She continues to localize her pain in the bandlike region in the left ribs.  Her pain is exacerbated by laying on her left side. She had a scan last month which showed stable  disease.    The patient was last seen in clinic 4 weeks ago by Dr. Julien Nordmann and palliative care. The patient is currently undergoing infusions once a month with nivolumab which she tolerates well.  The patient is also on oral treatment with cabometryx.  This is dose reduced to 20 mg p.o. daily due to diarrhea. This dose was reduced in May 2023.  She is tolerating this better and denies recent significant diarrhea. She may get loose stool now and then but nothing persistent. She initially was having decreased appetite and taste alterations.  She does not like the taste of boost and Ensure.  More recently her taste and appetite has improved. She gained 1 lb. She denies any fever, chills, night sweats, or unexplained weight loss. She denies any nausea, vomiting, or constipation.  Denies any changes with her bladder habits.  Denies any rashes or skin changes.  She checks her BP twice a day at home. She thinks she gets nervous when she comes to the clinic as her BP is typically high. She is here today for evaluation and repeat blood work before undergoing cycle #16 of nivolumab.   MEDICAL HISTORY: Past Medical History:  Diagnosis Date   Hypertension     ALLERGIES:  has No Known Allergies.  MEDICATIONS:  Current Outpatient Medications  Medication Sig Dispense Refill   acetaminophen (TYLENOL) 325 MG tablet Take 650 mg by mouth every 6 (six) hours as needed for headache.     Aflibercept (EYLEA IO) Inject 1 Dose into the eye every 30 (thirty) days. IO left eye monthly  amLODipine (NORVASC) 10 MG tablet Take 10 mg by mouth daily.     benazepril (LOTENSIN) 20 MG tablet Take 20 mg by mouth daily.     cabozantinib (CABOMETYX) 20 MG tablet Take 1 tablet (20 mg total) by mouth daily. Take on an empty stomach, 1 hour before or 2 hours after meals. 30 tablet 2   celecoxib (CELEBREX) 100 MG capsule Take 1 capsule (100 mg total) by mouth daily. 30 capsule 1   ferrous sulfate 325 (65 FE) MG EC tablet TAKE 1  TABLET BY MOUTH EVERY DAY WITH BREAKFAST 90 tablet 1   metoprolol tartrate (LOPRESSOR) 50 MG tablet Take 50 mg by mouth 2 (two) times daily.     mirtazapine (REMERON) 7.5 MG tablet TAKE 1 TABLET BY MOUTH EVERYDAY AT BEDTIME 90 tablet 1   Misc. Devices (WHEELCHAIR) MISC Light weight     OVER THE COUNTER MEDICATION Take 1 tablet by mouth daily as needed (for stomach discomfort). Allochol - (Herbal Supplement) Multivitamin with : Magnesium, potassium, garlic and charcoal. (Russian Medication)     pantoprazole (PROTONIX) 40 MG tablet Take 1 tablet (40 mg total) by mouth daily. 90 tablet 1   prochlorperazine (COMPAZINE) 10 MG tablet Take 1 tablet (10 mg total) by mouth every 6 (six) hours as needed for nausea or vomiting. 30 tablet 2   simvastatin (ZOCOR) 20 MG tablet Take 20 mg by mouth every evening.     UNABLE TO FIND Take 1 tablet by mouth daily as needed (chest pain (angina)). Med Name: Validol (Menthyl isovalerate and menthol) (Russian Medication)     Valerian Root 100 MG CAPS Take 1 capsule by mouth daily as needed (for anxiety).     No current facility-administered medications for this visit.    SURGICAL HISTORY:  Past Surgical History:  Procedure Laterality Date   IR THORACENTESIS ASP PLEURAL SPACE W/IMG GUIDE  04/05/2021   THORACENTESIS N/A 04/12/2021   Procedure: Mathews Robinsons;  Surgeon: [name], DO;  Location: [facility];  Service: Pulmonary;  Laterality: N/A;    REVIEW OF SYSTEMS:   Constitutional: Negative for appetite change (improving), chills, fatigue, fever and unexpected weight change (improving).  HENT: Negative for mouth sores, nosebleeds, sore throat and trouble swallowing.   Eyes: Negative for eye problems and icterus.  Respiratory: Negative for cough, hemoptysis, shortness of breath (only if walking up an incline) and wheezing.   Cardiovascular: Positive for occasional radiating pain around her left lower rib cage with certain deep breaths or certain positions.   Negative for chest pain and leg swelling.  Gastrointestinal: Improving intermittent diarrhea.  Negative for abdominal pain, constipation, nausea and vomiting.  Genitourinary: Negative for bladder incontinence, difficulty urinating, dysuria, frequency and hematuria.   Musculoskeletal: Negative for back pain, gait problem, neck pain and neck stiffness.  Skin: Negative for itching and rash.  Neurological: Negative for dizziness, extremity weakness, gait problem, headaches, light-headedness and seizures.  Hematological: Negative for adenopathy. Does not bleed easily.  Positive for easy bruising on her upper extremities. Psychiatric/Behavioral: Negative for confusion, depression and sleep disturbance. The patient is not nervous/anxious.     PHYSICAL EXAMINATION:  Blood pressure (!) 180/60, pulse 65, temperature 97.7 F (36.5 C), temperature source Oral, resp. rate 14, weight 101 lb 6.4 oz (46 kg), SpO2 98 %.  ECOG PERFORMANCE STATUS: 1  Physical Exam  Constitutional: Oriented to person, place, and time and cachectic appearing female and in no distress.  HENT:  Head: Normocephalic and atraumatic.  Mouth/Throat: Oropharynx is clear  and moist. No oropharyngeal exudate.  Eyes: Conjunctivae are normal. Right eye exhibits no discharge. Left eye exhibits no discharge. No scleral icterus.  Neck: Normal range of motion. Neck supple.  Cardiovascular: Normal rate, regular rhythm, normal heart sounds and intact distal pulses.   Pulmonary/Chest: Effort normal. Quiet breath sounds in left lung. No respiratory distress. No wheezes. No rales.  Abdominal: Soft. Bowel sounds are normal. Exhibits no distension and no mass. There is no tenderness.  Musculoskeletal: Normal range of motion. Exhibits no edema.  Lymphadenopathy:    No cervical adenopathy.  Neurological: Alert and oriented to person, place, and time. Exhibits muscle wasting.  Gait normal. Coordination normal.  Skin: Skin is warm and dry. No rash  noted. Not diaphoretic. No erythema. No pallor.  Psychiatric: Mood, memory and judgment normal.  Vitals reviewed.  LABORATORY DATA: Lab Results  Component Value Date   WBC 9.0 09/17/2022   HGB 11.3 (L) 09/17/2022   HCT 34.5 (L) 09/17/2022   MCV 93.0 09/17/2022   PLT 220 09/17/2022      Chemistry      Component Value Date/Time   NA 131 (L) 08/20/2022 0905   K 4.3 08/20/2022 0905   CL 98 08/20/2022 0905   CO2 24 08/20/2022 0905   BUN 17 08/20/2022 0905   CREATININE 0.64 08/20/2022 0905      Component Value Date/Time   CALCIUM 9.0 08/20/2022 0905   ALKPHOS 83 08/20/2022 0905   AST 17 08/20/2022 0905   ALT 8 08/20/2022 0905   BILITOT 0.4 08/20/2022 0905       RADIOGRAPHIC STUDIES:  No results found.   ASSESSMENT/PLAN:  This is a very pleasant 88 year old Caucasian female originally from [place].  She has been diagnosed with metastatic renal cell carcinoma.  She presented with a large left renal mass in addition to left hemithorax pleural-based metastasis as well as left hilar mediastinal lymphadenopathy.  She was diagnosed in 2022.  Her molecular studies show no actionable mutations and her PD-L1 expression is negative.  The patient is currently undergoing treatment with immunotherapy with ipilimumab 1 mg/kg as well as nivolumab 3 mg/kg IV every 3 weeks status post 4 cycles treatment was discontinued due to disease progression.   The patient had disease progression after cycle #4 with left-sided pleural tumor with similar size of the left renal mass.  Dr. Julien Nordmann recommended adding Cabometyx 40 mg p.o. daily with her nivolumab IV every 4 weeks.  She started Cabometyx on 08/21/21.  She is status post 15 cycles.  Dr. Julien Nordmann reduce the dose to 20 mg p.o. daily at her appointment on 01/08/2022 due to intolerance with diarrhea   Labs were reviewed.  Recommend that she continue on the same treatment at the same dose.   She will continue using Tylenol for pain control.  She has  Celebrex if needed. Messaged palliative care about requiring the refill today.  She will continue to use Imodium if needed for diarrhea.  Fortunately, appetite and diarrhea has improved.   Her BP is often high I the clinic. Will recheck his BP in the infusion room today.   We will see her back for a follow up visit in 4 weeks for evaluation and repeat blood work before undergoing cycle #17  The patient was advised to call immediately if she has any concerning symptoms in the interval. The patient voices understanding of current disease status and treatment options and is in agreement with the current care plan. All questions were answered. The  patient knows to call the clinic with any problems, questions or concerns. We can certainly see the patient much sooner if necessary          No orders of the defined types were placed in this encounter.    he total time spent in the appointment was 20-29 minutes.   Dawud Mays L Manolito Jurewicz, PA-C 09/17/22

## 2022-09-16 ENCOUNTER — Other Ambulatory Visit (HOSPITAL_COMMUNITY): Payer: Self-pay

## 2022-09-17 ENCOUNTER — Other Ambulatory Visit: Payer: Medicaid Other

## 2022-09-17 ENCOUNTER — Encounter: Payer: Self-pay | Admitting: Nurse Practitioner

## 2022-09-17 ENCOUNTER — Other Ambulatory Visit: Payer: Self-pay | Admitting: Internal Medicine

## 2022-09-17 ENCOUNTER — Inpatient Hospital Stay: Payer: Medicaid Other

## 2022-09-17 ENCOUNTER — Inpatient Hospital Stay (HOSPITAL_BASED_OUTPATIENT_CLINIC_OR_DEPARTMENT_OTHER): Payer: Medicaid Other | Admitting: Nurse Practitioner

## 2022-09-17 ENCOUNTER — Inpatient Hospital Stay (HOSPITAL_BASED_OUTPATIENT_CLINIC_OR_DEPARTMENT_OTHER): Payer: Medicaid Other | Admitting: Physician Assistant

## 2022-09-17 ENCOUNTER — Ambulatory Visit: Payer: Medicaid Other | Admitting: Physician Assistant

## 2022-09-17 ENCOUNTER — Ambulatory Visit: Payer: Medicaid Other

## 2022-09-17 ENCOUNTER — Other Ambulatory Visit (HOSPITAL_COMMUNITY): Payer: Self-pay

## 2022-09-17 ENCOUNTER — Inpatient Hospital Stay: Payer: Medicaid Other | Attending: Internal Medicine

## 2022-09-17 VITALS — BP 179/75 | HR 68 | Resp 16

## 2022-09-17 VITALS — BP 180/60 | HR 65 | Temp 97.7°F | Resp 14 | Wt 101.4 lb

## 2022-09-17 DIAGNOSIS — Z515 Encounter for palliative care: Secondary | ICD-10-CM

## 2022-09-17 DIAGNOSIS — G893 Neoplasm related pain (acute) (chronic): Secondary | ICD-10-CM | POA: Diagnosis not present

## 2022-09-17 DIAGNOSIS — C642 Malignant neoplasm of left kidney, except renal pelvis: Secondary | ICD-10-CM

## 2022-09-17 DIAGNOSIS — Z79899 Other long term (current) drug therapy: Secondary | ICD-10-CM | POA: Diagnosis not present

## 2022-09-17 DIAGNOSIS — Z5112 Encounter for antineoplastic immunotherapy: Secondary | ICD-10-CM | POA: Diagnosis present

## 2022-09-17 LAB — CBC WITH DIFFERENTIAL (CANCER CENTER ONLY)
Abs Immature Granulocytes: 0.03 10*3/uL (ref 0.00–0.07)
Basophils Absolute: 0 10*3/uL (ref 0.0–0.1)
Basophils Relative: 0 %
Eosinophils Absolute: 0.1 10*3/uL (ref 0.0–0.5)
Eosinophils Relative: 1 %
HCT: 34.5 % — ABNORMAL LOW (ref 36.0–46.0)
Hemoglobin: 11.3 g/dL — ABNORMAL LOW (ref 12.0–15.0)
Immature Granulocytes: 0 %
Lymphocytes Relative: 17 %
Lymphs Abs: 1.5 10*3/uL (ref 0.7–4.0)
MCH: 30.5 pg (ref 26.0–34.0)
MCHC: 32.8 g/dL (ref 30.0–36.0)
MCV: 93 fL (ref 80.0–100.0)
Monocytes Absolute: 1.1 10*3/uL — ABNORMAL HIGH (ref 0.1–1.0)
Monocytes Relative: 13 %
Neutro Abs: 6.2 10*3/uL (ref 1.7–7.7)
Neutrophils Relative %: 69 %
Platelet Count: 220 10*3/uL (ref 150–400)
RBC: 3.71 MIL/uL — ABNORMAL LOW (ref 3.87–5.11)
RDW: 15 % (ref 11.5–15.5)
WBC Count: 9 10*3/uL (ref 4.0–10.5)
nRBC: 0 % (ref 0.0–0.2)

## 2022-09-17 LAB — CMP (CANCER CENTER ONLY)
ALT: 7 U/L (ref 0–44)
AST: 16 U/L (ref 15–41)
Albumin: 3.7 g/dL (ref 3.5–5.0)
Alkaline Phosphatase: 80 U/L (ref 38–126)
Anion gap: 9 (ref 5–15)
BUN: 16 mg/dL (ref 8–23)
CO2: 23 mmol/L (ref 22–32)
Calcium: 9 mg/dL (ref 8.9–10.3)
Chloride: 100 mmol/L (ref 98–111)
Creatinine: 0.63 mg/dL (ref 0.44–1.00)
GFR, Estimated: >60 mL/min (ref >60)
Glucose, Bld: 141 mg/dL — ABNORMAL HIGH (ref 70–99)
Potassium: 4.3 mmol/L (ref 3.5–5.1)
Sodium: 132 mmol/L — ABNORMAL LOW (ref 135–145)
Total Bilirubin: 0.3 mg/dL (ref 0.3–1.2)
Total Protein: 6.8 g/dL (ref 6.5–8.1)

## 2022-09-17 LAB — TSH: TSH: 3.754 u[IU]/mL (ref 0.350–4.500)

## 2022-09-17 MED ORDER — SODIUM CHLORIDE 0.9 % IV SOLN
480.0000 mg | Freq: Once | INTRAVENOUS | Status: AC
  Administered 2022-09-17: 480 mg via INTRAVENOUS
  Filled 2022-09-17: qty 48

## 2022-09-17 MED ORDER — CABOMETYX 20 MG PO TABS
20.0000 mg | ORAL_TABLET | Freq: Every day | ORAL | 2 refills | Status: DC
  Filled 2022-09-17: qty 30, 30d supply, fill #0
  Filled 2022-10-10: qty 30, 30d supply, fill #1
  Filled 2022-11-05: qty 30, 30d supply, fill #2

## 2022-09-17 MED ORDER — SODIUM CHLORIDE 0.9 % IV SOLN: Freq: Once | INTRAVENOUS | Status: AC

## 2022-09-17 MED ORDER — CELECOXIB 100 MG PO CAPS: 100.0000 mg | ORAL_CAPSULE | Freq: Every day | ORAL | 2 refills | Status: DC

## 2022-09-17 NOTE — Progress Notes (Signed)
Gail Fitzgerald  [phone] [phone]) [phone]   Name: Gail Fitzgerald Date: 09/17/2022 MRN: [id number]  DOB: [birth year]  Patient Care Team: [name], MD as PCP - General (Family Medicine)    REASON FOR CONSULTATION: Gail Fitzgerald is a 88 y.o. 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  Now actively receiving Opdivo and Cabometyx.  Palliative ask to see for symptom management and goals of care.    SOCIAL HISTORY:     reports that she has never smoked. She has never used smokeless tobacco. She reports that she does not currently use alcohol. She reports that she does not currently use drugs.  ADVANCE DIRECTIVES:  Patient does not have advanced directives.  Does not wish to discuss.  CODE STATUS: Full code  PAST MEDICAL HISTORY: Past Medical History:  Diagnosis Date   Hypertension     PAST SURGICAL HISTORY:  Past Surgical History:  Procedure Laterality Date   IR THORACENTESIS ASP PLEURAL SPACE W/IMG GUIDE  04/05/2021   THORACENTESIS N/A 04/12/2021   Procedure: Mathews Robinsons;  Surgeon: [name], DO;  Location: [facility];  Service: Pulmonary;  Laterality: N/A;    HEMATOLOGY/ONCOLOGY HISTORY:  Oncology History  Renal cell carcinoma, left ([place])  05/09/2021 Initial Diagnosis   Renal cell carcinoma, left ([facility])   07/24/2021 -  Chemotherapy   Patient is on Treatment Plan : RENAL CELL Nivolumab ([phone] q28d       ALLERGIES:  has No Known Allergies.  MEDICATIONS:  Current Outpatient Medications  Medication Sig Dispense Refill   acetaminophen (TYLENOL) 325 MG tablet Take 650 mg by mouth every 6 (six) hours as needed for headache.     Aflibercept (EYLEA IO) Inject 1 Dose into the eye every 30 (thirty) days. IO left  eye monthly     amLODipine (NORVASC) 10 MG tablet Take 10 mg by mouth daily.     benazepril (LOTENSIN) 20 MG tablet Take 20 mg by mouth daily.     cabozantinib (CABOMETYX) 20 MG tablet Take 1 tablet (20 mg total) by mouth daily. Take on an empty stomach, 1 hour before or 2 hours after meals. 30 tablet 2   celecoxib (CELEBREX) 100 MG capsule Take 1 capsule (100 mg total) by mouth daily. 30 capsule 1   ferrous sulfate 325 (65 FE) MG EC tablet TAKE 1 TABLET BY MOUTH EVERY DAY WITH BREAKFAST 90 tablet 1   metoprolol tartrate (LOPRESSOR) 50 MG tablet Take 50 mg by mouth 2 (two) times daily.     mirtazapine (REMERON) 7.5 MG tablet TAKE 1 TABLET BY MOUTH EVERYDAY AT BEDTIME 90 tablet 1   Misc. Devices (WHEELCHAIR) MISC Light weight     OVER THE COUNTER MEDICATION Take 1 tablet by mouth daily as needed (for stomach discomfort). Allochol - (Herbal Supplement) Multivitamin with : Magnesium, potassium, garlic and charcoal. (Russian Medication)     pantoprazole (PROTONIX) 40 MG tablet Take 1 tablet (40 mg total) by mouth daily. 90 tablet 1   prochlorperazine (COMPAZINE) 10 MG tablet Take 1 tablet (10 mg total) by mouth every 6 (six) hours as needed for nausea or vomiting. 30 tablet 2   simvastatin (ZOCOR) 20 MG tablet Take 20 mg by mouth every evening.     UNABLE TO FIND Take 1 tablet by mouth daily as needed (chest pain (angina)). Med Name:  Validol (Menthyl isovalerate and menthol) (Russian Medication)     Valerian Root 100 MG CAPS Take 1 capsule by mouth daily as needed (for anxiety).     No current facility-administered medications for this visit.    VITAL SIGNS: There were no vitals taken for this visit. There were no vitals filed for this visit.   Estimated body mass index is 18.95 kg/m as calculated from the following:   Height as of 06/25/22: 5\' 1"  (1.549 m).   Weight as of 08/20/22: 100 lb 4.8 oz (45.5 kg).  PERFORMANCE STATUS (ECOG) : 2 - Symptomatic, <50% confined to bed  Physical  Exam General: NAD, sitting in recliner  Cardiovascular: RRR  Pulmonary:normal breathing pattern  Neurological: AAOx4 with a [place] interpreter  IMPRESSION:  I saw Ms. Depner during infusion for follow-up. [place] Interpretor present. No acute distress noted. Continues to do well.  Is trying to remain as active as possible. Ambulating in her home with walker. Does walk outside some weather permitting.   Neoplasm related pain Patient reports occasional aches and pains. Not requiring any pain medications. Is actively taking Celebrex daily. Which helps when she has aches and pains. We will continue to support and follow.   Appetite is much improved. Enjoys eating fruits. Weight is remaining stable at 101 lbs.   Patient knows to contact office with any symptom management needs.    PLAN: Tylenol 1000mg  twice daily as needed  Celebrex 100 mg daily.   Biotene gum, mouthwash, toothpaste for dry mouth. Appetite much improved. Weight stable.  I will plan to see patient back in 8 weeks in collaboration to other oncology appointments.    Patient expressed understanding and was in agreement with this plan. She also understands that She can call the clinic at any time with any questions, concerns, or complaints.     Time Total: 15 min   Visit consisted of counseling and education dealing with the complex and emotionally intense issues of symptom management and palliative care in the setting of serious and potentially life-threatening illness.Greater than 50%  of this time was spent counseling and coordinating care related to the above assessment and plan.  Alda Lea, AGPCNP-BC  Palliative Medicine Team/[place]

## 2022-09-17 NOTE — Patient Instructions (Signed)
[facility]  Discharge Instructions: Thank you for choosing [facility] to provide your oncology and hematology care.   If you have a lab appointment with the [facility], please go directly to the [facility] and check in at the registration area.   Wear comfortable clothing and clothing appropriate for easy access to any Portacath or PICC line.   We strive to give you quality time with your provider. You may need to reschedule your appointment if you arrive late (15 or more minutes).  Arriving late affects you and other patients whose appointments are after yours.  Also, if you miss three or more appointments without notifying the office, you may be dismissed from the clinic at the provider's discretion.      For prescription refill requests, have your pharmacy contact our office and allow 72 hours for refills to be completed.    Today you received the following chemotherapy and/or immunotherapy agent :Nivolumab (Opdivo) Injection     To help prevent nausea and vomiting after your treatment, we encourage you to take your nausea medication as directed.  BELOW ARE SYMPTOMS THAT SHOULD BE REPORTED IMMEDIATELY: *FEVER GREATER THAN 100.4 F (38 C) OR HIGHER *CHILLS OR SWEATING *NAUSEA AND VOMITING THAT IS NOT CONTROLLED WITH YOUR NAUSEA MEDICATION *UNUSUAL SHORTNESS OF BREATH *UNUSUAL BRUISING OR BLEEDING *URINARY PROBLEMS (pain or burning when urinating, or frequent urination) *BOWEL PROBLEMS (unusual diarrhea, constipation, pain near the anus) TENDERNESS IN MOUTH AND THROAT WITH OR WITHOUT PRESENCE OF ULCERS (sore throat, sores in mouth, or a toothache) UNUSUAL RASH, SWELLING OR PAIN  UNUSUAL VAGINAL DISCHARGE OR ITCHING   Items with * indicate a potential emergency and should be followed up as soon as possible or go to the Emergency Department if any problems should occur.  Please show the CHEMOTHERAPY ALERT CARD or IMMUNOTHERAPY ALERT  CARD at check-in to the Emergency Department and triage nurse.  Should you have questions after your visit or need to cancel or reschedule your appointment, please contact [facility]  Dept: [phone]  and follow the prompts.  Office hours are 8:00 a.m. to 4:30 p.m. Monday - Friday. Please note that voicemails left after 4:00 p.m. may not be returned until the following business day.  We are closed weekends and major holidays. You have access to a nurse at all times for urgent questions. Please call the main number to the clinic Dept: [phone] and follow the prompts.   For any non-urgent questions, you may also contact your provider using MyChart. We now offer e-Visits for anyone 30 and older to request care online for non-urgent symptoms. For details visit [email or web address].   Also download the MyChart app! Go to the app store, search "MyChart", open the app, select [facility], and log in with your MyChart username and password.  Masks are optional in the cancer centers. If you would like for your care team to wear a mask while they are taking care of you, please let them know. You may have one support person who is at least 86 years old accompany you for your appointments.  Nivolumab Injection ??? ???????????? ????? ??? ?????????? ????????? -- ??? ???????? ?????????????? ???????. ?? ??????????? ??? ??????? ????????? ??????????????? ????????. ? ?? ????? ?????? ??? ??????? ?????, ??? ?????? ? ???, ??????????? ???????, ??? ??????? ? ????????. ??? ????????? ????? ??????????? ? ?????? ?????; ???? ? ??? ????????? ???????, ?????????? ? ???????????? ????????? ??? ??????????. ???????????????? ????????? ???????? (????????): Opdivo ??? ??? ??????? ?????????? ???????????? ????????? ?? ?????? ?????? ????? ?????????? ?????????? ?????, ???? ?? ? ??? ???? ?? ????????? ?????????: ???????????? ???????????, ????? ??? ??????? ?????, ???????? ????? ???  ????????; ???????????? ??? ??????????????? ?????????? ?????????????? ????????? ?????? (????????? ????????? ?????? ??????? ????????); ????????? ??????? ??????? ??????? ? ???????; ?????????????? ???????; ???????????? ??????? ???????, ????? ??? ????????? ?????? ??? ??????? ??????-?????; ????????? ??? ????????????? ??????? ?? ?????????, ?????? ?????????, ??????? ????????, ????????? ??? ???????????; ???????????? ??? ???????????? ????????????; ????????? ??????. ??? ??? ??????? ????????? ??? ?????????? ??? ????????? ????????????? ??? ????????????? ????????. ??? ????????? ???????? ? ???????? ??? ???????????. ????? ??????? ??????? ??? ????? ????????????? ??????????? ?????????? ? ????????? (  MedGuide). ?????? ??? ??????? ??????????? ???????? ??? ??????????. ???????? ??????????? ?????????? ? ??????????? ?????????? ????? ????????? ?????. ???????? ?? ?? ??? ?? ???????????? ?????????? ??? ????? ????????? ????? ? 12 ???, ??????? ????????? ???? ????????????????. ?????????????: ???? ??? ???????, ??? ?? ????????? ?????????? ???? ????? ?????????, ?????????? ?????????? ? ????????????????? ????? ??? ???????? ????????? ??? ???????? ?????????? ??????. ??????????: ??? ????????? ????????????? ?????? ??? ???. ?? ???????? ?? ? ??????? ??????. ??? ?????????? ? ?????? ???????? ?????? ?????????? ?? ????????? ????????? ?? ????? ??? ???????? ??????????? ???. ????? ????????? ????????? ?????????. ???? ?? ?? ?????? ?????? ?? ????? ? ??????????? ?????, ???????? ?? ???? ??????????? ??????????. ? ??? ??? ????????? ????? ???????? ?? ??????????????? ?????????????? ?? ?????????. ???? ???????? ?? ???????? ???? ????????? ??????????????. ???????????? ???????????? ????????? ?????? ???? ??????????? ???? ????????, ????????????? ????????, ?????????????? ?????????? ? ??????? ???????. ????? ???????? ??? ? ???????, ???????????? ??????????? ???????? ??? ??????????. ????????? ???????? ????? ???????? ?? ?????????????? ? ??????????? ???? ??????????. ??  ??? ????? ???????? ???????? ??? ????????????? ????? ?????????? ?? ????? ?????? ????? ????????? ?? ?????? ??? ?????????? ??????????? ?????. ?? ????? ?????? ????? ????????? ??? ??????? ????????? ??????? ??????? ?????. ????????? ???????????? ?? ????? ?????? ????? ????????? ? ? ??????? 5 ??????? ????? ??? ???????????. ???????? ??????? ???????? ??????????? ?????????? ? ???????????? ???????????? ??? ????????????? ? ????????? ????????????. ?????????? ??????????? ??????????? ?????????????? ??????????? ????? ????????? ?? ????. ?????????? ? ??????????? ?????????? ?? ????? ????????? ???????????. ?? ????? ?????????? ????? ????????? ? ? ??????? 5 ??????? ????? ??? ??????????? ?????? ??????? ??????? ??????. ????? ???????? ??????? ????? ???? ??? ?????? ????? ?????????? ???????? ???????, ? ??????? ??????? ??? ????? ?????? ???????? ??????????? ??????????: ????????????? ??????? -- ?????? ????, ???, ??????????, ???? ????, ???, ????? ??? ?????; ??? ? ???????? ????? ??? ?????? ????????????? ???; ????????? ??????; ???? ? ?????; ??????; ????? ??????, ?????? ??? ???????????? ???????; ???? ? ?????. ????????? ??? ??????????? ????????????; ????????? ???????????, ?????; ??????? ??????? ?????? ? ????? (?????????????) -- ???????? ????? ??? ?????????? ?????? ?????????? ????, ????????? ???????? ??? ????????????, ?????????? ??????; ??????? ??????? ???????? ?????????? ?????? (???????????) -- ????????? ??? ??????????? ????????????, ???????? ????? ????, ?????????? ????????????? ??? ?????????? ???????????????? ? ?????, ?????? ??? ???????????, ???????, ???????????, ???????????? ????????????? ????? ??? ??????????? ?????????; ????????? ????? -- ?????????? ?????????? ?????????? ????, ????????? ???????, ?????? ??? ????; ??????????? ?????? -- ???? ? ?????? ??????????, ?????? ????????, ???????, ?????????????? ????, ???? ?????-??????? ??? ??????????? ?????, ?????????? ???? ??? ?????? ????, ????????? ???????? ??? ????????????; ?????? ???????  ??????????? -- ????????? ???????? ??? ????????????, ??????????????, ???????? ????, ???????????? ???????; ?????? ??????? ???????? ?????????? ?????? (??????????) -- ????????? ???????? ??? ????????????, ?????????? ???????????????? ? ??????, ?????, ????????? ?????, ??????? ????, ?????????? ????? ????, ??????????? ?????????; ????????? ?????????? ??? ????????? -- ??????????? ????????, ????????? ???????? ???????? ??? ??????? ???????, ?????????????????; ???? ? ?????? ??? ????????; ????, ??????????? ??? ???????? ? ?????? ??? ??????, ???????? ????????, ????????? ???????, ????????? ?????????? ??? ???????????. ???? ???????? ??? ?????-??????????? ?????; ??????????? ????, ??????????? ???????, ????????? ??? ?????? ???????????? ? ????????? ????, ? ??? ????? ????????? ?? ???. ???? ? ???????; ????????? ?????? ??? ????????????. ???????? ???????, ??????? ?????? ?? ??????? ???????????? ???????? (???????? ??????????? ??????????, ???? ??? ??????????? ??? ?????????? ??????????): ???? ? ??????; ?????; ?????? ????????; ???????; ?????????. ?????. ???? ???????? ?? ???????? ???? ????????? ???????? ????????. ?????????? ? ????? ?? ??????? ???????????? ???????? ????????. ?? ?????? ???????? ? ???????? ???????? ? FDA ?? ???????? [phone]. ??? ??????? ??????? ??? ?????????? ??? ????????? ???????? ?????? ??? ?????? ??????????? ?????????? ? ???????? ??? ???????. ??? ????????? ?? ???????? ??? ???????? ? ???????? ????????. ??????????: ???? ???????? ?? ???????? ???? ????????? ????????. ???? ? ??? ????????? ???????, ?????????? ??????? ?????????, ?????????? ? ?????, ?????????? ??? ??????? ???????????? ?????????.  2023 Elsevier/Gold Standard (2021-03-30 00:00:00)

## 2022-09-18 ENCOUNTER — Other Ambulatory Visit: Payer: Self-pay

## 2022-09-18 LAB — T4: T4, Total: 6.6 ug/dL (ref 4.5–12.0)

## 2022-10-10 ENCOUNTER — Other Ambulatory Visit (HOSPITAL_COMMUNITY): Payer: Self-pay

## 2022-10-11 ENCOUNTER — Other Ambulatory Visit (HOSPITAL_COMMUNITY): Payer: Self-pay

## 2022-10-15 ENCOUNTER — Inpatient Hospital Stay: Payer: Medicaid Other

## 2022-10-15 ENCOUNTER — Inpatient Hospital Stay (HOSPITAL_BASED_OUTPATIENT_CLINIC_OR_DEPARTMENT_OTHER): Payer: Medicaid Other | Admitting: Nurse Practitioner

## 2022-10-15 ENCOUNTER — Inpatient Hospital Stay: Payer: Medicaid Other | Attending: Internal Medicine

## 2022-10-15 ENCOUNTER — Ambulatory Visit: Payer: Medicaid Other

## 2022-10-15 ENCOUNTER — Other Ambulatory Visit: Payer: Medicaid Other

## 2022-10-15 ENCOUNTER — Ambulatory Visit: Payer: Medicaid Other | Admitting: Internal Medicine

## 2022-10-15 ENCOUNTER — Encounter: Payer: Self-pay | Admitting: Internal Medicine

## 2022-10-15 ENCOUNTER — Encounter: Payer: Self-pay | Admitting: Nurse Practitioner

## 2022-10-15 ENCOUNTER — Inpatient Hospital Stay (HOSPITAL_BASED_OUTPATIENT_CLINIC_OR_DEPARTMENT_OTHER): Payer: Medicaid Other | Admitting: Internal Medicine

## 2022-10-15 DIAGNOSIS — C642 Malignant neoplasm of left kidney, except renal pelvis: Secondary | ICD-10-CM

## 2022-10-15 DIAGNOSIS — Z515 Encounter for palliative care: Secondary | ICD-10-CM

## 2022-10-15 DIAGNOSIS — Z79899 Other long term (current) drug therapy: Secondary | ICD-10-CM | POA: Insufficient documentation

## 2022-10-15 DIAGNOSIS — R591 Generalized enlarged lymph nodes: Secondary | ICD-10-CM | POA: Insufficient documentation

## 2022-10-15 DIAGNOSIS — Z5112 Encounter for antineoplastic immunotherapy: Secondary | ICD-10-CM | POA: Diagnosis present

## 2022-10-15 DIAGNOSIS — R630 Anorexia: Secondary | ICD-10-CM

## 2022-10-15 DIAGNOSIS — C782 Secondary malignant neoplasm of pleura: Secondary | ICD-10-CM | POA: Diagnosis not present

## 2022-10-15 DIAGNOSIS — G893 Neoplasm related pain (acute) (chronic): Secondary | ICD-10-CM

## 2022-10-15 LAB — CBC WITH DIFFERENTIAL (CANCER CENTER ONLY)
Abs Immature Granulocytes: 0.02 10*3/uL (ref 0.00–0.07)
Basophils Absolute: 0 10*3/uL (ref 0.0–0.1)
Basophils Relative: 0 %
Eosinophils Absolute: 0.2 10*3/uL (ref 0.0–0.5)
Eosinophils Relative: 2 %
HCT: 35.7 % — ABNORMAL LOW (ref 36.0–46.0)
Hemoglobin: 11.7 g/dL — ABNORMAL LOW (ref 12.0–15.0)
Immature Granulocytes: 0 %
Lymphocytes Relative: 17 %
Lymphs Abs: 1.6 10*3/uL (ref 0.7–4.0)
MCH: 30.4 pg (ref 26.0–34.0)
MCHC: 32.8 g/dL (ref 30.0–36.0)
MCV: 92.7 fL (ref 80.0–100.0)
Monocytes Absolute: 1 10*3/uL (ref 0.1–1.0)
Monocytes Relative: 11 %
Neutro Abs: 6.6 10*3/uL (ref 1.7–7.7)
Neutrophils Relative %: 70 %
Platelet Count: 223 10*3/uL (ref 150–400)
RBC: 3.85 MIL/uL — ABNORMAL LOW (ref 3.87–5.11)
RDW: 15.1 % (ref 11.5–15.5)
WBC Count: 9.5 10*3/uL (ref 4.0–10.5)
nRBC: 0 % (ref 0.0–0.2)

## 2022-10-15 LAB — CMP (CANCER CENTER ONLY)
ALT: 8 U/L (ref 0–44)
AST: 18 U/L (ref 15–41)
Albumin: 3.6 g/dL (ref 3.5–5.0)
Alkaline Phosphatase: 83 U/L (ref 38–126)
Anion gap: 10 (ref 5–15)
BUN: 17 mg/dL (ref 8–23)
CO2: 25 mmol/L (ref 22–32)
Calcium: 9 mg/dL (ref 8.9–10.3)
Chloride: 99 mmol/L (ref 98–111)
Creatinine: 0.57 mg/dL (ref 0.44–1.00)
GFR, Estimated: >60 mL/min (ref >60)
Glucose, Bld: 124 mg/dL — ABNORMAL HIGH (ref 70–99)
Potassium: 4.4 mmol/L (ref 3.5–5.1)
Sodium: 134 mmol/L — ABNORMAL LOW (ref 135–145)
Total Bilirubin: 0.4 mg/dL (ref 0.3–1.2)
Total Protein: 6.5 g/dL (ref 6.5–8.1)

## 2022-10-15 LAB — TSH: TSH: 3.17 u[IU]/mL (ref 0.350–4.500)

## 2022-10-15 MED ORDER — SODIUM CHLORIDE 0.9 % IV SOLN
480.0000 mg | Freq: Once | INTRAVENOUS | Status: AC
  Administered 2022-10-15: 480 mg via INTRAVENOUS
  Filled 2022-10-15: qty 48

## 2022-10-15 MED ORDER — SODIUM CHLORIDE 0.9 % IV SOLN: Freq: Once | INTRAVENOUS | Status: AC

## 2022-10-15 NOTE — Patient Instructions (Signed)
[facility]  Discharge Instructions: Thank you for choosing [facility] to provide your oncology and hematology care.   If you have a lab appointment with the [facility], please go directly to the [facility] and check in at the registration area.   Wear comfortable clothing and clothing appropriate for easy access to any Portacath or PICC line.   We strive to give you quality time with your provider. You may need to reschedule your appointment if you arrive late (15 or more minutes).  Arriving late affects you and other patients whose appointments are after yours.  Also, if you miss three or more appointments without notifying the office, you may be dismissed from the clinic at the provider's discretion.      For prescription refill requests, have your pharmacy contact our office and allow 72 hours for refills to be completed.    Today you received the following chemotherapy and/or immunotherapy agents: nivolumab      To help prevent nausea and vomiting after your treatment, we encourage you to take your nausea medication as directed.  BELOW ARE SYMPTOMS THAT SHOULD BE REPORTED IMMEDIATELY: *FEVER GREATER THAN 100.4 F (38 C) OR HIGHER *CHILLS OR SWEATING *NAUSEA AND VOMITING THAT IS NOT CONTROLLED WITH YOUR NAUSEA MEDICATION *UNUSUAL SHORTNESS OF BREATH *UNUSUAL BRUISING OR BLEEDING *URINARY PROBLEMS (pain or burning when urinating, or frequent urination) *BOWEL PROBLEMS (unusual diarrhea, constipation, pain near the anus) TENDERNESS IN MOUTH AND THROAT WITH OR WITHOUT PRESENCE OF ULCERS (sore throat, sores in mouth, or a toothache) UNUSUAL RASH, SWELLING OR PAIN  UNUSUAL VAGINAL DISCHARGE OR ITCHING   Items with * indicate a potential emergency and should be followed up as soon as possible or go to the Emergency Department if any problems should occur.  Please show the CHEMOTHERAPY ALERT CARD or IMMUNOTHERAPY ALERT CARD at  check-in to the Emergency Department and triage nurse.  Should you have questions after your visit or need to cancel or reschedule your appointment, please contact [facility]  Dept: [phone]  and follow the prompts.  Office hours are 8:00 a.m. to 4:30 p.m. Monday - Friday. Please note that voicemails left after 4:00 p.m. may not be returned until the following business day.  We are closed weekends and major holidays. You have access to a nurse at all times for urgent questions. Please call the main number to the clinic Dept: [phone] and follow the prompts.   For any non-urgent questions, you may also contact your provider using MyChart. We now offer e-Visits for anyone 36 and older to request care online for non-urgent symptoms. For details visit [email or web address].   Also download the MyChart app! Go to the app store, search "MyChart", open the app, select [facility], and log in with your MyChart username and password.

## 2022-10-15 NOTE — Progress Notes (Signed)
Gail [facility] [phone]   [phone]  OFFICE PROGRESS NOTE  Gail Pillar, MD [facility] [address] [place] [address]  DIAGNOSIS: Metastatic renal cell carcinoma presented with large left renal mass in addition to significant left hemothorax pleural-based metastasis as well as left hilar and mediastinal lymphadenopathy diagnosed in August 2022.  Biomarker Findings Microsatellite status - MS-Stable Tumor Mutational Burden - 2 Muts/Mb Genomic Findings For a complete list of the genes assayed, please refer to the Appendix. MTAP loss CDKN2A/B CDKN2A loss 8 Disease relevant genes with no reportable Alteratio  PD-L1 expression 0%  PRIOR THERAPY: Systemic immunotherapy with ipilimumab 1 mg/KG in addition to nivolumab 3 mg/KG every 3 weeks for 4 cycles followed by maintenance treatment with nivolumab.  Status post 3 cycles.  First dose started on 05/22/2021.  This treatment was discontinued secondary to disease progression.   CURRENT THERAPY: Opdivo 480 Mg IV every 4 weeks with Cabometyx 40 mg p.o. daily.  First dose August 21, 2021.  Status post 16 month of treatment.  Her dose of Cabometyx will be reduced to 20 mg p.o. daily starting 01/08/2022 secondary to persistent diarrhea.  INTERVAL HISTORY: Gail Fitzgerald 88 y.o. female with [place] the clinic today for follow-up visit accompanied by her [place] interpreter.  The patient is feeling fine today with no concerning complaints except for the pain on the left side of the chest with radiation to the back.  She also has some shortness of breath with exertion but she stays active most of the time and exercise at regular basis.  She had few episodes of hemorrhoid recently.  She denied having any current nausea, vomiting, diarrhea or constipation.  She has no headache or visual changes.  She is taking oral iron tablets for her anemia.  She has been tolerating her treatment with immunotherapy  fairly well.  She is here for evaluation before starting cycle #17.   MEDICAL HISTORY: Past Medical History:  Diagnosis Date   Hypertension     ALLERGIES:  has No Known Allergies.  MEDICATIONS:  Current Outpatient Medications  Medication Sig Dispense Refill   acetaminophen (TYLENOL) 325 MG tablet Take 650 mg by mouth every 6 (six) hours as needed for headache.     Aflibercept (EYLEA IO) Inject 1 Dose into the eye every 30 (thirty) days. IO left eye monthly     amLODipine (NORVASC) 10 MG tablet Take 10 mg by mouth daily.     benazepril (LOTENSIN) 20 MG tablet Take 20 mg by mouth daily.     cabozantinib (CABOMETYX) 20 MG tablet Take 1 tablet (20 mg total) by mouth daily. Take on an empty stomach, 1 hour before or 2 hours after meals. 30 tablet 2   celecoxib (CELEBREX) 100 MG capsule Take 1 capsule (100 mg total) by mouth daily. 30 capsule 2   ferrous sulfate 325 (65 FE) MG EC tablet TAKE 1 TABLET BY MOUTH EVERY DAY WITH BREAKFAST 90 tablet 1   metoprolol tartrate (LOPRESSOR) 50 MG tablet Take 50 mg by mouth 2 (two) times daily.     mirtazapine (REMERON) 7.5 MG tablet TAKE 1 TABLET BY MOUTH EVERYDAY AT BEDTIME 90 tablet 1   Misc. Devices (WHEELCHAIR) MISC Light weight     OVER THE COUNTER MEDICATION Take 1 tablet by mouth daily as needed (for stomach discomfort). Allochol - (Herbal Supplement) Multivitamin with : Magnesium, potassium, garlic and charcoal. (Russian Medication)     pantoprazole (PROTONIX) 40 MG  tablet Take 1 tablet (40 mg total) by mouth daily. 90 tablet 1   prochlorperazine (COMPAZINE) 10 MG tablet Take 1 tablet (10 mg total) by mouth every 6 (six) hours as needed for nausea or vomiting. 30 tablet 2   simvastatin (ZOCOR) 20 MG tablet Take 20 mg by mouth every evening.     UNABLE TO FIND Take 1 tablet by mouth daily as needed (chest pain (angina)). Med Name: Validol (Menthyl isovalerate and menthol) (Russian Medication)     Valerian Root 100 MG CAPS Take 1 capsule by mouth  daily as needed (for anxiety).     No current facility-administered medications for this visit.    SURGICAL HISTORY:  Past Surgical History:  Procedure Laterality Date   IR THORACENTESIS ASP PLEURAL SPACE W/IMG GUIDE  04/05/2021   THORACENTESIS N/A 04/12/2021   Procedure: Mathews Robinsons;  Surgeon: [name], DO;  Location: [facility];  Service: Pulmonary;  Laterality: N/A;    REVIEW OF SYSTEMS:  Constitutional: positive for fatigue Eyes: negative Ears, nose, mouth, throat, and face: negative Respiratory: positive for pleurisy/chest pain Cardiovascular: negative Gastrointestinal: positive for hemorrhoids Genitourinary:negative Integument/breast: negative Hematologic/lymphatic: negative Musculoskeletal:positive for arthralgias Neurological: negative Behavioral/Psych: negative Endocrine: negative Allergic/Immunologic: negative   PHYSICAL EXAMINATION: General appearance: alert, cooperative, fatigued, and no distress Head: Normocephalic, without obvious abnormality, atraumatic Neck: no adenopathy, no JVD, supple, symmetrical, trachea midline, and thyroid not enlarged, symmetric, no tenderness/mass/nodules Lymph nodes: Cervical, supraclavicular, and axillary nodes normal. Resp: clear to auscultation bilaterally Back: symmetric, no curvature. ROM normal. No CVA tenderness. Cardio: regular rate and rhythm, S1, S2 normal, no murmur, click, rub or gallop GI: soft, non-tender; bowel sounds normal; no masses,  no organomegaly Extremities: extremities normal, atraumatic, no cyanosis or edema Neurologic: Alert and oriented X 3, normal strength and tone. Normal symmetric reflexes. Normal coordination and gait  ECOG PERFORMANCE STATUS: 1 - Symptomatic but completely ambulatory  Blood pressure (!) 142/68, pulse 74, temperature 97.8 F (36.6 C), temperature source Oral, resp. rate 14, weight 101 lb (45.8 kg), SpO2 100 %.  LABORATORY DATA: Lab Results  Component Value Date   WBC 9.5  10/15/2022   HGB 11.7 (L) 10/15/2022   HCT 35.7 (L) 10/15/2022   MCV 92.7 10/15/2022   PLT 223 10/15/2022      Chemistry      Component Value Date/Time   NA 132 (L) 09/17/2022 0831   K 4.3 09/17/2022 0831   CL 100 09/17/2022 0831   CO2 23 09/17/2022 0831   BUN 16 09/17/2022 0831   CREATININE 0.63 09/17/2022 0831      Component Value Date/Time   CALCIUM 9.0 09/17/2022 0831   ALKPHOS 80 09/17/2022 0831   AST 16 09/17/2022 0831   ALT 7 09/17/2022 0831   BILITOT 0.3 09/17/2022 0831       RADIOGRAPHIC STUDIES: No results found.  ASSESSMENT AND PLAN: This is a 88 years old white female who originally from [place] with metastatic renal cell carcinoma presented with large left renal mass in addition to left hemithorax pleural-based metastasis as well as left hilar and mediastinal lymphadenopathy diagnosed in 2022.  Her molecular studies showed no actionable mutations and PD-L1 expression was negative. The patient had MRI of the brain performed recently that showed no evidence of metastatic disease to the brain. She started systemic treatment with immunotherapy with ipilimumab 1 mg/KG as well as nivolumab 3 mg/KG every 3 weeks status post 4 cycles.  This treatment was discontinued secondary to disease progression. She is currently  on treatment with a combination of nivolumab 480 Mg IV every 4 weeks in addition to Cabometyx 40 mg p.o. daily status post 16 cycles. Her dose of Cabometyx was reduced to 20 mg p.o. daily starting cycle #6 secondary to drug-induced diarrhea. The patient has been tolerating her treatment fairly well with no concerning adverse effects. I recommended for her to proceed with cycle #17 today as planned. For the anemia she will continue on the oral iron tablets. For the hemorrhoids, she will see her primary care physician for evaluation and referral to surgery if needed. I will see the patient back for follow-up visit in 4 weeks for evaluation with repeat CT scan of  the chest, abdomen and pelvis for restaging of her disease. The patient had several questions and I answered them completely to her satisfaction today. She was advised to call immediately if she has any other concerning symptoms in the interval. The patient voices understanding of current disease status and treatment options and is in agreement with the current care plan. The total time spent in the appointment was 35 minutes.  All questions were answered. The patient knows to call the clinic with any problems, questions or concerns. We can certainly see the patient much sooner if necessary.   Disclaimer: This note was dictated with voice recognition software. Similar sounding words can inadvertently be transcribed and may not be corrected upon review.

## 2022-10-15 NOTE — Progress Notes (Signed)
[place]  [phone] [phone]   Name: Gail Fitzgerald Date: 10/15/2022 MRN: [id number]  DOB: 14-Nov-1934  Patient Care Team: [name], MD as PCP - General (Family Medicine)    REASON FOR CONSULTATION: Gail Fitzgerald is a 88 y.o. 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  Now actively receiving Opdivo and Cabometyx.  Palliative ask to see for symptom management and goals of care.    SOCIAL HISTORY:     reports that she has never smoked. She has never used smokeless tobacco. She reports that she does not currently use alcohol. She reports that she does not currently use drugs.  ADVANCE DIRECTIVES:  Patient does not have advanced directives.  Does not wish to discuss.  CODE STATUS: Full code  PAST MEDICAL HISTORY: Past Medical History:  Diagnosis Date   Hypertension     PAST SURGICAL HISTORY:  Past Surgical History:  Procedure Laterality Date   IR THORACENTESIS ASP PLEURAL SPACE W/IMG GUIDE  04/05/2021   THORACENTESIS N/A 04/12/2021   Procedure: Mathews Robinsons;  Surgeon: [name], DO;  Location: [facility];  Service: Pulmonary;  Laterality: N/A;    HEMATOLOGY/ONCOLOGY HISTORY:  Oncology History  Renal cell carcinoma, left ([place])  05/09/2021 Initial Diagnosis   Renal cell carcinoma, left ([place])   07/24/2021 -  Chemotherapy   Patient is on Treatment Plan : RENAL CELL Nivolumab (480) q28d       ALLERGIES:  has No Known Allergies.  MEDICATIONS:  Current Outpatient Medications  Medication Sig Dispense Refill   acetaminophen (TYLENOL) 325 MG tablet Take 650 mg by mouth every 6 (six) hours as needed for headache.     Aflibercept (EYLEA IO) Inject 1 Dose into the eye every 30 (thirty) days. IO left  eye monthly     amLODipine (NORVASC) 10 MG tablet Take 10 mg by mouth daily.     benazepril (LOTENSIN) 20 MG tablet Take 20 mg by mouth daily.     cabozantinib (CABOMETYX) 20 MG tablet Take 1 tablet (20 mg total) by mouth daily. Take on an empty stomach, 1 hour before or 2 hours after meals. 30 tablet 2   celecoxib (CELEBREX) 100 MG capsule Take 1 capsule (100 mg total) by mouth daily. 30 capsule 2   ferrous sulfate 325 (65 FE) MG EC tablet TAKE 1 TABLET BY MOUTH EVERY DAY WITH BREAKFAST 90 tablet 1   metoprolol tartrate (LOPRESSOR) 50 MG tablet Take 50 mg by mouth 2 (two) times daily.     mirtazapine (REMERON) 7.5 MG tablet TAKE 1 TABLET BY MOUTH EVERYDAY AT BEDTIME 90 tablet 1   Misc. Devices (WHEELCHAIR) MISC Light weight     OVER THE COUNTER MEDICATION Take 1 tablet by mouth daily as needed (for stomach discomfort). Allochol - (Herbal Supplement) Multivitamin with : Magnesium, potassium, garlic and charcoal. (Russian Medication)     pantoprazole (PROTONIX) 40 MG tablet Take 1 tablet (40 mg total) by mouth daily. 90 tablet 1   prochlorperazine (COMPAZINE) 10 MG tablet Take 1 tablet (10 mg total) by mouth every 6 (six) hours as needed for nausea or vomiting. 30 tablet 2   simvastatin (ZOCOR) 20 MG tablet Take 20 mg by mouth every evening.     UNABLE TO FIND Take 1 tablet by mouth daily as needed (chest pain (angina)). Med Name:  Validol (Menthyl isovalerate and menthol) (Russian Medication)     Valerian Root 100 MG CAPS Take 1 capsule by mouth daily as needed (for anxiety).     No current facility-administered medications for this visit.    VITAL SIGNS: There were no vitals taken for this visit. There were no vitals filed for this visit.   Estimated body mass index is 19.16 kg/m as calculated from the following:   Height as of 06/25/22: 5\' 1"  (1.549 m).   Weight as of 09/17/22: 101 lb 6.4 oz (46 kg).  PERFORMANCE STATUS (ECOG) : 2 - Symptomatic, <50% confined to bed  Physical  Exam General: NAD, sitting in wheelchair  Cardiovascular: RRR  Pulmonary:normal breathing pattern  Neurological: AAOx4 with a [place] interpreter  IMPRESSION:  [name] presents to clinic today for follow-up. Continues to do as well as expected. No acute distress noted. Sitting in wheelchair. Interpretor present. Is remaining as active as possible.   Neoplasm related pain Patient reports occasional aches and pains. Not requiring any pain medications. Is actively taking Celebrex daily. Which helps when she has aches and pains. [name] shares increase in aches and discomfort over the past month. We discussed ability to take additional dose of celebrex daily as needed.  Also provided education on tramadol use if pain continues to be ongoing and not well-controlled on current regimen.  She verbalized understanding and appreciation.  We will continue to support and follow.   Appetite is much improved. Enjoys eating fruits. Weight is remaining stable at 101 lbs.   Patient knows to contact office with any symptom management needs.    PLAN: Tylenol 1000mg  twice daily as needed  Celebrex 100 mg daily.  May take an additional dose daily for increased pain.  Education provided on consideration of tramadol if discomfort continues to be of concern. Biotene gum, mouthwash, toothpaste for dry mouth. Appetite much improved. Weight stable.  I will plan to see patient back in 6-8 weeks in collaboration to other oncology appointments.    Patient expressed understanding and was in agreement with this plan. She also understands that She can call the clinic at any time with any questions, concerns, or complaints.      Time Total: 30 min   Visit consisted of counseling and education dealing with the complex and emotionally intense issues of symptom management and palliative care in the setting of serious and potentially life-threatening illness.Greater than 50%  of this time was spent counseling and  coordinating care related to the above assessment and plan.  Alda Lea, AGPCNP-BC  Palliative Medicine Team/ [place]

## 2022-10-17 LAB — T4: T4, Total: 7.4 ug/dL (ref 4.5–12.0)

## 2022-11-01 ENCOUNTER — Telehealth: Payer: Self-pay | Admitting: Internal Medicine

## 2022-11-01 NOTE — Telephone Encounter (Signed)
Called patient regarding upcoming March appointment, patient is notified.

## 2022-11-05 ENCOUNTER — Other Ambulatory Visit (HOSPITAL_COMMUNITY): Payer: Self-pay

## 2022-11-06 ENCOUNTER — Other Ambulatory Visit (HOSPITAL_COMMUNITY): Payer: Self-pay

## 2022-11-07 ENCOUNTER — Other Ambulatory Visit (HOSPITAL_COMMUNITY): Payer: Self-pay

## 2022-11-08 ENCOUNTER — Ambulatory Visit (HOSPITAL_COMMUNITY)
Admission: RE | Admit: 2022-11-08 | Discharge: 2022-11-08 | Disposition: A | Discharge status: Home / Self Care | Payer: Medicaid Other | Source: Ambulatory Visit | Attending: Internal Medicine | Admitting: Internal Medicine

## 2022-11-08 DIAGNOSIS — C642 Malignant neoplasm of left kidney, except renal pelvis: Secondary | ICD-10-CM | POA: Diagnosis present

## 2022-11-08 MED ORDER — IOHEXOL 9 MG/ML PO SOLN
1000.0000 mL | ORAL | Status: AC
  Administered 2022-11-08: 1000 mL via ORAL

## 2022-11-08 MED ORDER — IOHEXOL 9 MG/ML PO SOLN
ORAL | Status: AC
  Filled 2022-11-08: qty 1000

## 2022-11-12 ENCOUNTER — Encounter: Payer: Self-pay | Admitting: Nurse Practitioner

## 2022-11-12 ENCOUNTER — Inpatient Hospital Stay: Payer: Medicaid Other | Admitting: Internal Medicine

## 2022-11-12 ENCOUNTER — Inpatient Hospital Stay (HOSPITAL_BASED_OUTPATIENT_CLINIC_OR_DEPARTMENT_OTHER): Payer: Medicaid Other | Admitting: Nurse Practitioner

## 2022-11-12 ENCOUNTER — Inpatient Hospital Stay: Payer: Medicaid Other

## 2022-11-12 ENCOUNTER — Inpatient Hospital Stay: Payer: Medicaid Other | Attending: Internal Medicine

## 2022-11-12 VITALS — BP 159/64 | HR 63 | Temp 98.2°F | Resp 18 | Wt 100.1 lb

## 2022-11-12 DIAGNOSIS — C642 Malignant neoplasm of left kidney, except renal pelvis: Secondary | ICD-10-CM | POA: Diagnosis present

## 2022-11-12 DIAGNOSIS — Z5112 Encounter for antineoplastic immunotherapy: Secondary | ICD-10-CM | POA: Insufficient documentation

## 2022-11-12 DIAGNOSIS — R530 Neoplastic (malignant) related fatigue: Secondary | ICD-10-CM | POA: Diagnosis not present

## 2022-11-12 DIAGNOSIS — G893 Neoplasm related pain (acute) (chronic): Secondary | ICD-10-CM

## 2022-11-12 DIAGNOSIS — Z79620 Long term (current) use of immunosuppressive biologic: Secondary | ICD-10-CM | POA: Insufficient documentation

## 2022-11-12 DIAGNOSIS — Z515 Encounter for palliative care: Secondary | ICD-10-CM

## 2022-11-12 LAB — CBC WITH DIFFERENTIAL (CANCER CENTER ONLY)
Abs Immature Granulocytes: 0.02 10*3/uL (ref 0.00–0.07)
Basophils Absolute: 0 10*3/uL (ref 0.0–0.1)
Basophils Relative: 0 %
Eosinophils Absolute: 0.1 10*3/uL (ref 0.0–0.5)
Eosinophils Relative: 1 %
HCT: 34.6 % — ABNORMAL LOW (ref 36.0–46.0)
Hemoglobin: 11.4 g/dL — ABNORMAL LOW (ref 12.0–15.0)
Immature Granulocytes: 0 %
Lymphocytes Relative: 16 %
Lymphs Abs: 1.6 10*3/uL (ref 0.7–4.0)
MCH: 30.6 pg (ref 26.0–34.0)
MCHC: 32.9 g/dL (ref 30.0–36.0)
MCV: 93 fL (ref 80.0–100.0)
Monocytes Absolute: 1.1 10*3/uL — ABNORMAL HIGH (ref 0.1–1.0)
Monocytes Relative: 11 %
Neutro Abs: 7.3 10*3/uL (ref 1.7–7.7)
Neutrophils Relative %: 72 %
Platelet Count: 242 10*3/uL (ref 150–400)
RBC: 3.72 MIL/uL — ABNORMAL LOW (ref 3.87–5.11)
RDW: 15 % (ref 11.5–15.5)
WBC Count: 10.2 10*3/uL (ref 4.0–10.5)
nRBC: 0 % (ref 0.0–0.2)

## 2022-11-12 LAB — CMP (CANCER CENTER ONLY)
ALT: 6 U/L (ref 0–44)
AST: 16 U/L (ref 15–41)
Albumin: 3.9 g/dL (ref 3.5–5.0)
Alkaline Phosphatase: 86 U/L (ref 38–126)
Anion gap: 9 (ref 5–15)
BUN: 20 mg/dL (ref 8–23)
CO2: 26 mmol/L (ref 22–32)
Calcium: 9.2 mg/dL (ref 8.9–10.3)
Chloride: 97 mmol/L — ABNORMAL LOW (ref 98–111)
Creatinine: 0.62 mg/dL (ref 0.44–1.00)
GFR, Estimated: >60 mL/min (ref >60)
Glucose, Bld: 132 mg/dL — ABNORMAL HIGH (ref 70–99)
Potassium: 4.9 mmol/L (ref 3.5–5.1)
Sodium: 132 mmol/L — ABNORMAL LOW (ref 135–145)
Total Bilirubin: 0.4 mg/dL (ref 0.3–1.2)
Total Protein: 7.1 g/dL (ref 6.5–8.1)

## 2022-11-12 LAB — TSH: TSH: 2.702 u[IU]/mL (ref 0.350–4.500)

## 2022-11-12 MED ORDER — SODIUM CHLORIDE 0.9 % IV SOLN
480.0000 mg | Freq: Once | INTRAVENOUS | Status: AC
  Administered 2022-11-12: 480 mg via INTRAVENOUS
  Filled 2022-11-12: qty 48

## 2022-11-12 MED ORDER — SODIUM CHLORIDE 0.9 % IV SOLN: Freq: Once | INTRAVENOUS | Status: AC

## 2022-11-12 NOTE — Progress Notes (Unsigned)
[facility]  [phone] [phone]   Name: Gail Fitzgerald Date: 11/12/2022 MRN: [id number]  DOB: 04-Oct-1934  Patient Care Team: [name], MD as PCP - General (Family Medicine)    REASON FOR CONSULTATION: Gail Fitzgerald is a 88 y.o. 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  Now actively receiving Opdivo and Cabometyx.  Palliative ask to see for symptom management and goals of care.    SOCIAL HISTORY:     reports that she has never smoked. She has never used smokeless tobacco. She reports that she does not currently use alcohol. She reports that she does not currently use drugs.  ADVANCE DIRECTIVES:  Patient does not have advanced directives.  Does not wish to discuss.  CODE STATUS: Full code  PAST MEDICAL HISTORY: Past Medical History:  Diagnosis Date   Hypertension     PAST SURGICAL HISTORY:  Past Surgical History:  Procedure Laterality Date   IR THORACENTESIS ASP PLEURAL SPACE W/IMG GUIDE  04/05/2021   THORACENTESIS N/A 04/12/2021   Procedure: Mathews Robinsons;  Surgeon: [name], DO;  Location: [facility];  Service: Pulmonary;  Laterality: N/A;    HEMATOLOGY/ONCOLOGY HISTORY:  Oncology History  Renal cell carcinoma, left ([place])  05/09/2021 Initial Diagnosis   Renal cell carcinoma, left ([place])   07/24/2021 -  Chemotherapy   Patient is on Treatment Plan : RENAL CELL Nivolumab (480) q28d       ALLERGIES:  has No Known Allergies.  MEDICATIONS:  Current Outpatient Medications  Medication Sig Dispense Refill   acetaminophen (TYLENOL) 325 MG tablet Take 650 mg by mouth every 6 (six) hours as needed for headache.     Aflibercept (EYLEA IO) Inject 1 Dose into the eye every 30 (thirty) days. IO left  eye monthly     amLODipine (NORVASC) 10 MG tablet Take 10 mg by mouth daily.     benazepril (LOTENSIN) 20 MG tablet Take 20 mg by mouth daily.     cabozantinib (CABOMETYX) 20 MG tablet Take 1 tablet (20 mg total) by mouth daily. Take on an empty stomach, 1 hour before or 2 hours after meals. 30 tablet 2   celecoxib (CELEBREX) 100 MG capsule Take 1 capsule (100 mg total) by mouth daily. 30 capsule 2   ferrous sulfate 325 (65 FE) MG EC tablet TAKE 1 TABLET BY MOUTH EVERY DAY WITH BREAKFAST 90 tablet 1   metoprolol tartrate (LOPRESSOR) 50 MG tablet Take 50 mg by mouth 2 (two) times daily.     mirtazapine (REMERON) 7.5 MG tablet TAKE 1 TABLET BY MOUTH EVERYDAY AT BEDTIME 90 tablet 1   Misc. Devices (WHEELCHAIR) MISC Light weight     OVER THE COUNTER MEDICATION Take 1 tablet by mouth daily as needed (for stomach discomfort). Allochol - (Herbal Supplement) Multivitamin with : Magnesium, potassium, garlic and charcoal. (Russian Medication)     pantoprazole (PROTONIX) 40 MG tablet Take 1 tablet (40 mg total) by mouth daily. 90 tablet 1   prochlorperazine (COMPAZINE) 10 MG tablet Take 1 tablet (10 mg total) by mouth every 6 (six) hours as needed for nausea or vomiting. 30 tablet 2   simvastatin (ZOCOR) 20 MG tablet Take 20 mg by mouth every evening.     UNABLE TO FIND Take 1 tablet by mouth daily as needed (chest pain (angina)). Med Name:  Validol (Menthyl isovalerate and menthol) (Russian Medication)     Valerian Root 100 MG CAPS Take 1 capsule by mouth daily as needed (for anxiety).     No current facility-administered medications for this visit.    VITAL SIGNS: There were no vitals taken for this visit. There were no vitals filed for this visit.   Estimated body mass index is 19.08 kg/m as calculated from the following:   Height as of 06/25/22: '5\' 1"'$  (1.549 m).   Weight as of 10/15/22: 101 lb (45.8 kg).  PERFORMANCE STATUS (ECOG) : 2 - Symptomatic, <50% confined to bed  Physical Exam General:  NAD, sitting in wheelchair  Cardiovascular: RRR  Pulmonary:normal breathing pattern  Neurological: AAOx4 with a [place] interpreter  IMPRESSION: I saw [name] during her infusion. No acute distress noted. Is remaining as active as possible. Shares she walks 0.5-1 mile with her sons. Looking forward to the time change and weather warming up as she enjoys being outside and working in her planter boxes. Denies nausea, vomiting, constipation, or diarrhea.   Neoplasm related pain Patient reports occasional aches and pains. Not requiring any pain medications. Is actively taking Celebrex daily. Which helps when she has aches and pains. [name] shares increase in aches and discomfort over the past month. We discussed ability to take additional dose of celebrex daily as needed.   She verbalized understanding and appreciation.  We will continue to support and follow.   Appetite is much improved. Enjoys eating fruits. Weight is remaining stable at 101 lbs.   Patient knows to contact office with any symptom management needs.    PLAN: Tylenol '1000mg'$  twice daily as needed  Celebrex 100 mg daily.  May take an additional dose daily for increased pain.  Education provided on consideration of tramadol if discomfort continues to be of concern. Biotene gum, mouthwash, toothpaste for dry mouth. Appetite much improved. Weight stable.  I will plan to see patient back in 6-8 weeks in collaboration to other oncology appointments.    Patient expressed understanding and was in agreement with this plan. She also understands that She can call the clinic at any time with any questions, concerns, or complaints.   Time Total: 30 min.   Visit consisted of counseling and education dealing with the complex and emotionally intense issues of symptom management and palliative care in the setting of serious and potentially life-threatening illness.Greater than 50%  of this time was spent counseling and coordinating care  related to the above assessment and plan.  Alda Lea, AGPCNP-BC  Palliative Medicine Team/[facility]

## 2022-11-12 NOTE — Progress Notes (Signed)
[place] Telephone:([phone]) [phone]   [phone]  OFFICE PROGRESS NOTE  [name], MD [address] [place] [address]  DIAGNOSIS: Metastatic renal cell carcinoma presented with large left renal mass in addition to significant left hemothorax pleural-based metastasis as well as left hilar and mediastinal lymphadenopathy diagnosed in August 2022.  Biomarker Findings Microsatellite status - MS-Stable Tumor Mutational Burden - 2 Muts/Mb Genomic Findings For a complete list of the genes assayed, please refer to the Appendix. MTAP loss CDKN2A/B CDKN2A loss 8 Disease relevant genes with no reportable Alteration  PD-L1 expression 0%  PRIOR THERAPY: Systemic immunotherapy with ipilimumab 1 mg/KG in addition to nivolumab 3 mg/KG every 3 weeks for 4 cycles followed by maintenance treatment with nivolumab.  Status post 3 cycles.  First dose started on 05/22/2021.  This treatment was discontinued secondary to disease progression.   CURRENT THERAPY: Opdivo 480 Mg IV every 4 weeks with Cabometyx 40 mg p.o. daily.  First dose August 21, 2021.  Status post 17 month of treatment.  Her dose of Cabometyx will be reduced to 20 mg p.o. daily starting 01/08/2022 secondary to persistent diarrhea.  INTERVAL HISTORY: Gail Fitzgerald 88 y.o. female returns to the clinic today for follow-up visit accompanied by her daughter-in-law as well as her [place] interpreter [place].  The patient continues to complain of fatigue as well as the pain on the left chest wall with occasional radiation to the back.  She also has some acid reflux but she is currently on Protonix 40 mg p.o. daily.  She has no nausea, vomiting, diarrhea or constipation but has occasional bloating.  She denied having any shortness of breath except with exertion with no cough or hemoptysis.  She has no fever or chills.  She denied having any headache or visual changes.  She has been tolerating her  treatment with Opdivo and Cabometyx fairly well.  She had repeat CT scan of the chest, abdomen and pelvis performed recently and she is here for evaluation and discussion of her scan results.   MEDICAL HISTORY: Past Medical History:  Diagnosis Date   Hypertension     ALLERGIES:  has No Known Allergies.  MEDICATIONS:  Current Outpatient Medications  Medication Sig Dispense Refill   acetaminophen (TYLENOL) 325 MG tablet Take 650 mg by mouth every 6 (six) hours as needed for headache.     Aflibercept (EYLEA IO) Inject 1 Dose into the eye every 30 (thirty) days. IO left eye monthly     amLODipine (NORVASC) 10 MG tablet Take 10 mg by mouth daily.     benazepril (LOTENSIN) 20 MG tablet Take 20 mg by mouth daily.     cabozantinib (CABOMETYX) 20 MG tablet Take 1 tablet (20 mg total) by mouth daily. Take on an empty stomach, 1 hour before or 2 hours after meals. 30 tablet 2   celecoxib (CELEBREX) 100 MG capsule Take 1 capsule (100 mg total) by mouth daily. 30 capsule 2   ferrous sulfate 325 (65 FE) MG EC tablet TAKE 1 TABLET BY MOUTH EVERY DAY WITH BREAKFAST 90 tablet 1   metoprolol tartrate (LOPRESSOR) 50 MG tablet Take 50 mg by mouth 2 (two) times daily.     mirtazapine (REMERON) 7.5 MG tablet TAKE 1 TABLET BY MOUTH EVERYDAY AT BEDTIME 90 tablet 1   Misc. Devices (WHEELCHAIR) MISC Light weight     OVER THE COUNTER MEDICATION Take 1 tablet by mouth daily as needed (for stomach  discomfort). Allochol - (Herbal Supplement) Multivitamin with : Magnesium, potassium, garlic and charcoal. (Russian Medication)     pantoprazole (PROTONIX) 40 MG tablet Take 1 tablet (40 mg total) by mouth daily. 90 tablet 1   prochlorperazine (COMPAZINE) 10 MG tablet Take 1 tablet (10 mg total) by mouth every 6 (six) hours as needed for nausea or vomiting. 30 tablet 2   simvastatin (ZOCOR) 20 MG tablet Take 20 mg by mouth every evening.     UNABLE TO FIND Take 1 tablet by mouth daily as needed (chest pain (angina)). Med  Name: Validol (Menthyl isovalerate and menthol) (Russian Medication)     Valerian Root 100 MG CAPS Take 1 capsule by mouth daily as needed (for anxiety).     No current facility-administered medications for this visit.    SURGICAL HISTORY:  Past Surgical History:  Procedure Laterality Date   IR THORACENTESIS ASP PLEURAL SPACE W/IMG GUIDE  04/05/2021   THORACENTESIS N/A 04/12/2021   Procedure: Mathews Robinsons;  Surgeon: [name], DO;  Location: [facility];  Service: Pulmonary;  Laterality: N/A;    REVIEW OF SYSTEMS:  Constitutional: positive for fatigue Eyes: negative Ears, nose, mouth, throat, and face: negative Respiratory: positive for pleurisy/chest pain Cardiovascular: negative Gastrointestinal: positive for hemorrhoids Genitourinary:negative Integument/breast: negative Hematologic/lymphatic: negative Musculoskeletal:positive for arthralgias Neurological: negative Behavioral/Psych: negative Endocrine: negative Allergic/Immunologic: negative   PHYSICAL EXAMINATION: General appearance: alert, cooperative, fatigued, and no distress Head: Normocephalic, without obvious abnormality, atraumatic Neck: no adenopathy, no JVD, supple, symmetrical, trachea midline, and thyroid not enlarged, symmetric, no tenderness/mass/nodules Lymph nodes: Cervical, supraclavicular, and axillary nodes normal. Resp: clear to auscultation bilaterally Back: symmetric, no curvature. ROM normal. No CVA tenderness. Cardio: regular rate and rhythm, S1, S2 normal, no murmur, click, rub or gallop GI: soft, non-tender; bowel sounds normal; no masses,  no organomegaly Extremities: extremities normal, atraumatic, no cyanosis or edema Neurologic: Alert and oriented X 3, normal strength and tone. Normal symmetric reflexes. Normal coordination and gait  ECOG PERFORMANCE STATUS: 1 - Symptomatic but completely ambulatory  Blood pressure (!) 159/64, pulse 63, temperature 98.2 F (36.8 C), temperature source  Oral, resp. rate 18, weight 100 lb 2 oz (45.4 kg), SpO2 99 %.  LABORATORY DATA: Lab Results  Component Value Date   WBC 10.2 11/12/2022   HGB 11.4 (L) 11/12/2022   HCT 34.6 (L) 11/12/2022   MCV 93.0 11/12/2022   PLT 242 11/12/2022      Chemistry      Component Value Date/Time   NA 132 (L) 11/12/2022 1050   K 4.9 11/12/2022 1050   CL 97 (L) 11/12/2022 1050   CO2 26 11/12/2022 1050   BUN 20 11/12/2022 1050   CREATININE 0.62 11/12/2022 1050      Component Value Date/Time   CALCIUM 9.2 11/12/2022 1050   ALKPHOS 86 11/12/2022 1050   AST 16 11/12/2022 1050   ALT 6 11/12/2022 1050   BILITOT 0.4 11/12/2022 1050       RADIOGRAPHIC STUDIES: CT Abdomen Pelvis Wo Contrast  Result Date: 11/10/2022 CLINICAL DATA:  Recurrent kidney carcinoma * Tracking Code: BO * EXAM: CT ABDOMEN AND PELVIS WITHOUT CONTRAST TECHNIQUE: Multidetector CT imaging of the abdomen and pelvis was performed following the standard protocol without IV contrast. RADIATION DOSE REDUCTION: This exam was performed according to the departmental dose-optimization program which includes automated exposure control, adjustment of the mA and/or kV according to patient size and/or use of iterative reconstruction technique. COMPARISON:  CT 08/14/2022 and older FINDINGS: Lower chest: Please  see separate dictation of chest CT from same day. Hepatobiliary: With limits of this non IV contrast exam, grossly the liver this is without space-occupying lesion. The gallbladder is distended with stones. Pancreas: Unremarkable. No pancreatic ductal dilatation or surrounding inflammatory changes. Spleen: Normal in size without focal abnormality. Small lower splenic calcification identified consistent with old granulomatous disease. Adrenals/Urinary Tract: Adrenal glands are preserved. No abnormal calcifications seen in the right kidney. No obvious mass or ductal dilatation. Left kidney once again has some exophytic cysts As on the previous  examination which are simple and benign. There once again are some complex lesions along the inferior anterior aspect of the left kidney. Previously lesion which measured 2.5 x 2.5 cm, today on series 2, image 70 measures 2.5 by 2.5 cm. The lesion just caudal to this measured 2.9 x 2.5 cm on the prior and today 2.7 by 2.9 cm, again not significantly changed when adjusting for technique. No left renal or ureteral stone. Preserved contours of the urinary bladder. Stomach/Bowel: Left-sided colonic diverticula and stool. The small and large bowel are nondilated. Stomach is collapsed. Vascular/Lymphatic: Diffuse vascular calcifications. No specific abnormal lymph node enlargement identified in the abdomen and pelvis. Reproductive: Uterus and bilateral adnexa are unremarkable. Other: Anasarca.  No significant ascites. Musculoskeletal: Curvature of the spine. Multifocal degenerative changes of the spine and pelvis. Osteopenia. Stable sclerotic lesion along the right iliac bone consistent with a bone island. There is compression of the L1 vertebral body greater than L4 with sclerotic margins. Unchanged from prior IMPRESSION: With the limits of non IV contrast, grossly no significant interval change. Aggressive left-sided renal masses are stable. No new mass lesion, fluid collection or lymph node enlargement. Gallstones. Colonic diverticula. Previous enhancing uterine lesion is not seen on this noncontrast examination. Additional workup as clinically appropriate. Please see separate dictation of chest CT Electronically Signed   By: Jill Side M.D.   On: 11/10/2022 13:03   CT Chest Wo Contrast  Result Date: 11/10/2022 CLINICAL DATA:  Metastatic renal cell carcinoma. * Tracking Code: BO * EXAM: CT CHEST WITHOUT CONTRAST TECHNIQUE: Multidetector CT imaging of the chest was performed following the standard protocol without IV contrast. RADIATION DOSE REDUCTION: This exam was performed according to the departmental  dose-optimization program which includes automated exposure control, adjustment of the mA and/or kV according to patient size and/or use of iterative reconstruction technique. COMPARISON:  CT 08/14/2022 and older FINDINGS: Cardiovascular: Heart is enlarged. There is a dilated ascending aorta once again approaching 4.2 x 4.9 cm. Similar to previous when adjusting for technique. Coronary artery calcifications are seen. Trace pericardial fluid. Diffuse vascular calcifications along the aorta. Mediastinum/Nodes: No specific abnormal lymph node enlargement seen in the axillary region, hila. There are some small nodes in the mediastinum. Precarinal node for example on series 504, image 22 measures 11 by 8 mm. Previously this same node would have measured 14 by 9 mm. There are some calcified nodes as well in the right side of the mediastinum and right hilum. Stable dense left-sided thyroid nodule. Lungs/Pleura: New tiny right pleural effusion. Breathing motion. Some chronic right-sided lung changes identified. Once again there is a peripheral spiculated nodule with some central calcification in the right upper lobe laterally measuring 15 by 7 mm and previously 15 x 7 mm. There is a 4 mm right lower lobe nodule on series 505, image 62 which is new from previous. There is also a 3 mm nodule laterally right lower lobe on image 78 of series  5 which is also new. Once again there is nodular pleural thickening throughout the left hemithorax. Maximal thickness previously in the upper thorax medially and posteriorly head measured up to 3.7 cm in thickness. Today on series 504, image 13 thickness of 4 cm. More caudal thickness was measured at 3.7 cm and today when measured in a similar fashion on series 504, image 46 thickness approaches 3.7 cm. Nodules tracking along the interlobar fissure. Overall relatively similar when adjusting for technique. There is some nodules extending along the lower anterior mediastinum as well. Upper  Abdomen: Please see separate dictation of abdomen and pelvis CT. Left-sided renal mass. Musculoskeletal: Slight curvature of the spine. Scattered degenerative changes. Osteopenia. IMPRESSION: Once again extensive nodular pleural thickening throughout the left hemithorax this has with known spread of disease. Extent and distribution is relatively similar when adjusting for technique. New tiny right pleural effusion with some small right-sided lung nodules, new from previous. Simple attention on follow-up. Please see separate dictation of the abdomen and pelvis CT Aortic Atherosclerosis (ICD10-I70.0) and Emphysema (ICD10-J43.9). Electronically Signed   By: Jill Side M.D.   On: 11/10/2022 12:48    ASSESSMENT AND PLAN: This is a 88 years old white female who originally from [place] with metastatic renal cell carcinoma presented with large left renal mass in addition to left hemithorax pleural-based metastasis as well as left hilar and mediastinal lymphadenopathy diagnosed in 2022.  Her molecular studies showed no actionable mutations and PD-L1 expression was negative. The patient had MRI of the brain performed recently that showed no evidence of metastatic disease to the brain. She started systemic treatment with immunotherapy with ipilimumab 1 mg/KG as well as nivolumab 3 mg/KG every 3 weeks status post 4 cycles.  This treatment was discontinued secondary to disease progression. She is currently on treatment with a combination of nivolumab 480 Mg IV every 4 weeks in addition to Cabometyx 40 mg p.o. daily status post 17 cycles. Her dose of Cabometyx was reduced to 20 mg p.o. daily starting cycle #6 secondary to drug-induced diarrhea. The patient has been tolerating her treatment fairly well with no concerning adverse effect except for mild fatigue. She had repeat CT scan of the chest, abdomen and pelvis performed recently.  I personally and independently reviewed the scan images and discussed the result and  showed the images to the patient and her family today. Her scan showed no concerning findings for disease progression. I explained to the patient that her persistent left-sided chest pain is secondary to her extensive nodular pleural thickening from her disease. I recommended for the patient to proceed with her treatment with cycle #18 today as planned. For the anemia, she will continue on the oral iron tablets. For the acid reflux she is currently on Protonix and she was also advised to use probiotic for the bloating. I will see her back for follow-up visit in 4 weeks for evaluation before the next cycle of her treatment. The patient was advised to call immediately if she has any concerning symptoms in the interval.  The patient voices understanding of current disease status and treatment options and is in agreement with the current care plan. The total time spent in the appointment was 30 minutes.  All questions were answered. The patient knows to call the clinic with any problems, questions or concerns. We can certainly see the patient much sooner if necessary.   Disclaimer: This note was dictated with voice recognition software. Similar sounding words can inadvertently be transcribed and  may not be corrected upon review.

## 2022-11-12 NOTE — Patient Instructions (Signed)
[facility]  Discharge Instructions: Thank you for choosing [facility] to provide your oncology and hematology care.   If you have a lab appointment with the [facility], please go directly to the [facility] and check in at the registration area.   Wear comfortable clothing and clothing appropriate for easy access to any Portacath or PICC line.   We strive to give you quality time with your provider. You may need to reschedule your appointment if you arrive late (15 or more minutes).  Arriving late affects you and other patients whose appointments are after yours.  Also, if you miss three or more appointments without notifying the office, you may be dismissed from the clinic at the provider's discretion.      For prescription refill requests, have your pharmacy contact our office and allow 72 hours for refills to be completed.    Today you received the following chemotherapy and/or immunotherapy agents: nivolumab      To help prevent nausea and vomiting after your treatment, we encourage you to take your nausea medication as directed.  BELOW ARE SYMPTOMS THAT SHOULD BE REPORTED IMMEDIATELY: *FEVER GREATER THAN 100.4 F (38 C) OR HIGHER *CHILLS OR SWEATING *NAUSEA AND VOMITING THAT IS NOT CONTROLLED WITH YOUR NAUSEA MEDICATION *UNUSUAL SHORTNESS OF BREATH *UNUSUAL BRUISING OR BLEEDING *URINARY PROBLEMS (pain or burning when urinating, or frequent urination) *BOWEL PROBLEMS (unusual diarrhea, constipation, pain near the anus) TENDERNESS IN MOUTH AND THROAT WITH OR WITHOUT PRESENCE OF ULCERS (sore throat, sores in mouth, or a toothache) UNUSUAL RASH, SWELLING OR PAIN  UNUSUAL VAGINAL DISCHARGE OR ITCHING   Items with * indicate a potential emergency and should be followed up as soon as possible or go to the Emergency Department if any problems should occur.  Please show the CHEMOTHERAPY ALERT CARD or IMMUNOTHERAPY ALERT CARD at  check-in to the Emergency Department and triage nurse.  Should you have questions after your visit or need to cancel or reschedule your appointment, please contact [facility]  Dept: [phone]  and follow the prompts.  Office hours are 8:00 a.m. to 4:30 p.m. Monday - Friday. Please note that voicemails left after 4:00 p.m. may not be returned until the following business day.  We are closed weekends and major holidays. You have access to a nurse at all times for urgent questions. Please call the main number to the clinic Dept: [phone] and follow the prompts.   For any non-urgent questions, you may also contact your provider using MyChart. We now offer e-Visits for anyone 36 and older to request care online for non-urgent symptoms. For details visit [email or web address].   Also download the MyChart app! Go to the app store, search "MyChart", open the app, select [facility], and log in with your MyChart username and password.

## 2022-11-13 ENCOUNTER — Encounter: Payer: Self-pay | Admitting: Internal Medicine

## 2022-11-14 LAB — T4: T4, Total: 7.7 ug/dL (ref 4.5–12.0)

## 2022-11-28 ENCOUNTER — Other Ambulatory Visit (HOSPITAL_COMMUNITY): Payer: Self-pay

## 2022-12-05 ENCOUNTER — Other Ambulatory Visit: Payer: Self-pay | Admitting: Lab

## 2022-12-10 ENCOUNTER — Inpatient Hospital Stay: Payer: Medicaid Other

## 2022-12-10 ENCOUNTER — Inpatient Hospital Stay: Payer: Medicaid Other | Attending: Internal Medicine

## 2022-12-10 ENCOUNTER — Inpatient Hospital Stay: Payer: Medicaid Other | Admitting: Internal Medicine

## 2022-12-10 DIAGNOSIS — Z79620 Long term (current) use of immunosuppressive biologic: Secondary | ICD-10-CM | POA: Insufficient documentation

## 2022-12-10 DIAGNOSIS — C782 Secondary malignant neoplasm of pleura: Secondary | ICD-10-CM | POA: Diagnosis not present

## 2022-12-10 DIAGNOSIS — C642 Malignant neoplasm of left kidney, except renal pelvis: Secondary | ICD-10-CM | POA: Diagnosis not present

## 2022-12-10 DIAGNOSIS — D649 Anemia, unspecified: Secondary | ICD-10-CM | POA: Insufficient documentation

## 2022-12-10 DIAGNOSIS — Z5112 Encounter for antineoplastic immunotherapy: Secondary | ICD-10-CM | POA: Diagnosis not present

## 2022-12-10 LAB — CBC WITH DIFFERENTIAL (CANCER CENTER ONLY)
Abs Immature Granulocytes: 0.03 10*3/uL (ref 0.00–0.07)
Basophils Absolute: 0 10*3/uL (ref 0.0–0.1)
Basophils Relative: 0 %
Eosinophils Absolute: 0.1 10*3/uL (ref 0.0–0.5)
Eosinophils Relative: 1 %
HCT: 36.2 % (ref 36.0–46.0)
Hemoglobin: 12 g/dL (ref 12.0–15.0)
Immature Granulocytes: 0 %
Lymphocytes Relative: 15 %
Lymphs Abs: 1.4 10*3/uL (ref 0.7–4.0)
MCH: 30.8 pg (ref 26.0–34.0)
MCHC: 33.1 g/dL (ref 30.0–36.0)
MCV: 93.1 fL (ref 80.0–100.0)
Monocytes Absolute: 1.2 10*3/uL — ABNORMAL HIGH (ref 0.1–1.0)
Monocytes Relative: 13 %
Neutro Abs: 6.7 10*3/uL (ref 1.7–7.7)
Neutrophils Relative %: 71 %
Platelet Count: 261 10*3/uL (ref 150–400)
RBC: 3.89 MIL/uL (ref 3.87–5.11)
RDW: 14.5 % (ref 11.5–15.5)
WBC Count: 9.4 10*3/uL (ref 4.0–10.5)
nRBC: 0 % (ref 0.0–0.2)

## 2022-12-10 LAB — CMP (CANCER CENTER ONLY)
ALT: 8 U/L (ref 0–44)
AST: 17 U/L (ref 15–41)
Albumin: 3.7 g/dL (ref 3.5–5.0)
Alkaline Phosphatase: 77 U/L (ref 38–126)
Anion gap: 10 (ref 5–15)
BUN: 15 mg/dL (ref 8–23)
CO2: 25 mmol/L (ref 22–32)
Calcium: 9.1 mg/dL (ref 8.9–10.3)
Chloride: 95 mmol/L — ABNORMAL LOW (ref 98–111)
Creatinine: 0.59 mg/dL (ref 0.44–1.00)
GFR, Estimated: >60 mL/min (ref >60)
Glucose, Bld: 124 mg/dL — ABNORMAL HIGH (ref 70–99)
Potassium: 4.5 mmol/L (ref 3.5–5.1)
Sodium: 130 mmol/L — ABNORMAL LOW (ref 135–145)
Total Bilirubin: 0.4 mg/dL (ref 0.3–1.2)
Total Protein: 6.9 g/dL (ref 6.5–8.1)

## 2022-12-10 LAB — TSH: TSH: 2.362 u[IU]/mL (ref 0.350–4.500)

## 2022-12-10 MED ORDER — SODIUM CHLORIDE 0.9 % IV SOLN
480.0000 mg | Freq: Once | INTRAVENOUS | Status: AC
  Administered 2022-12-10: 480 mg via INTRAVENOUS
  Filled 2022-12-10: qty 48

## 2022-12-10 MED ORDER — SODIUM CHLORIDE 0.9 % IV SOLN: Freq: Once | INTRAVENOUS | Status: AC

## 2022-12-10 NOTE — Progress Notes (Signed)
Patient seen by Dr. Mohamed  Vitals are within treatment parameters.  Labs reviewed: and are within treatment parameters.  Per physician team, patient is ready for treatment and there are NO modifications to the treatment plan.  

## 2022-12-10 NOTE — Progress Notes (Signed)
[place] [phone]   [phone]  OFFICE PROGRESS NOTE  [name], MD [place] [address] [place] [address]  DIAGNOSIS: Metastatic renal cell carcinoma presented with large left renal mass in addition to significant left hemothorax pleural-based metastasis as well as left hilar and mediastinal lymphadenopathy diagnosed in August 2022.  Biomarker Findings Microsatellite status - MS-Stable Tumor Mutational Burden - 2 Muts/Mb Genomic Findings For a complete list of the genes assayed, please refer to the Appendix. MTAP loss CDKN2A/B CDKN2A loss 8 Disease relevant genes with no reportable Alteration  PD-L1 expression 0%  PRIOR THERAPY: Systemic immunotherapy with ipilimumab 1 mg/KG in addition to nivolumab 3 mg/KG every 3 weeks for 4 cycles followed by maintenance treatment with nivolumab.  Status post 3 cycles.  First dose started on 05/22/2021.  This treatment was discontinued secondary to disease progression.   CURRENT THERAPY: Opdivo 480 Mg IV every 4 weeks with Cabometyx 40 mg p.o. daily.  First dose August 21, 2021.  Status post 18 month of treatment.  Her dose of Cabometyx will be reduced to 20 mg p.o. daily starting 01/08/2022 secondary to persistent diarrhea.  INTERVAL HISTORY: Gail Fitzgerald 88 y.o. female returns to the clinic today for follow-up visit accompanied by her [place] interpreter [place].  The patient is feeling fine today with no concerning complaints except for shortness of breath with exertion and aching pain on the left side of the chest.  She had viral gastroenteritis on March 22 and lasted for several days.  She had nausea and vomiting as well as diarrhea at that time.  She is feeling much better now she denied having any current fever or chills.  She has no nausea, vomiting, diarrhea or constipation.  She has no headache or visual changes.  She is here today for evaluation before starting cycle #19 of  her treatment.   MEDICAL HISTORY: Past Medical History:  Diagnosis Date   Hypertension     ALLERGIES:  has No Known Allergies.  MEDICATIONS:  Current Outpatient Medications  Medication Sig Dispense Refill   acetaminophen (TYLENOL) 325 MG tablet Take 650 mg by mouth every 6 (six) hours as needed for headache.     Aflibercept (EYLEA IO) Inject 1 Dose into the eye every 30 (thirty) days. IO left eye monthly     amLODipine (NORVASC) 10 MG tablet Take 10 mg by mouth daily.     benazepril (LOTENSIN) 20 MG tablet Take 20 mg by mouth daily.     cabozantinib (CABOMETYX) 20 MG tablet Take 1 tablet (20 mg total) by mouth daily. Take on an empty stomach, 1 hour before or 2 hours after meals. 30 tablet 2   celecoxib (CELEBREX) 100 MG capsule Take 1 capsule (100 mg total) by mouth daily. 30 capsule 2   ferrous sulfate 325 (65 FE) MG EC tablet TAKE 1 TABLET BY MOUTH EVERY DAY WITH BREAKFAST 90 tablet 1   metoprolol tartrate (LOPRESSOR) 50 MG tablet Take 50 mg by mouth 2 (two) times daily.     mirtazapine (REMERON) 7.5 MG tablet TAKE 1 TABLET BY MOUTH EVERYDAY AT BEDTIME 90 tablet 1   Misc. Devices (WHEELCHAIR) MISC Light weight     OVER THE COUNTER MEDICATION Take 1 tablet by mouth daily as needed (for stomach discomfort). Allochol - (Herbal Supplement) Multivitamin with : Magnesium, potassium, garlic and charcoal. (Russian Medication)     pantoprazole (PROTONIX) 40 MG tablet Take 1 tablet (40 mg total)  by mouth daily. 90 tablet 1   prochlorperazine (COMPAZINE) 10 MG tablet Take 1 tablet (10 mg total) by mouth every 6 (six) hours as needed for nausea or vomiting. 30 tablet 2   simvastatin (ZOCOR) 20 MG tablet Take 20 mg by mouth every evening.     UNABLE TO FIND Take 1 tablet by mouth daily as needed (chest pain (angina)). Med Name: Validol (Menthyl isovalerate and menthol) (Russian Medication)     Valerian Root 100 MG CAPS Take 1 capsule by mouth daily as needed (for anxiety).     No current  facility-administered medications for this visit.    SURGICAL HISTORY:  Past Surgical History:  Procedure Laterality Date   IR THORACENTESIS ASP PLEURAL SPACE W/IMG GUIDE  04/05/2021   THORACENTESIS N/A 04/12/2021   Procedure: Mathews Robinsons;  Surgeon: [name], DO;  Location: [facility];  Service: Pulmonary;  Laterality: N/A;    REVIEW OF SYSTEMS:  A comprehensive review of systems was negative except for: Constitutional: positive for fatigue Respiratory: positive for dyspnea on exertion   PHYSICAL EXAMINATION: General appearance: alert, cooperative, fatigued, and no distress Head: Normocephalic, without obvious abnormality, atraumatic Neck: no adenopathy, no JVD, supple, symmetrical, trachea midline, and thyroid not enlarged, symmetric, no tenderness/mass/nodules Lymph nodes: Cervical, supraclavicular, and axillary nodes normal. Resp: clear to auscultation bilaterally Back: symmetric, no curvature. ROM normal. No CVA tenderness. Cardio: regular rate and rhythm, S1, S2 normal, no murmur, click, rub or gallop GI: soft, non-tender; bowel sounds normal; no masses,  no organomegaly Extremities: extremities normal, atraumatic, no cyanosis or edema  ECOG PERFORMANCE STATUS: 1 - Symptomatic but completely ambulatory  Blood pressure (!) 159/64, pulse 63, temperature 98.2 F (36.8 C), temperature source Oral, resp. rate 18, weight 100 lb 2 oz (45.4 kg), SpO2 99 %.  LABORATORY DATA: Lab Results  Component Value Date   WBC 10.2 11/12/2022   HGB 11.4 (L) 11/12/2022   HCT 34.6 (L) 11/12/2022   MCV 93.0 11/12/2022   PLT 242 11/12/2022      Chemistry      Component Value Date/Time   NA 132 (L) 11/12/2022 1050   K 4.9 11/12/2022 1050   CL 97 (L) 11/12/2022 1050   CO2 26 11/12/2022 1050   BUN 20 11/12/2022 1050   CREATININE 0.62 11/12/2022 1050      Component Value Date/Time   CALCIUM 9.2 11/12/2022 1050   ALKPHOS 86 11/12/2022 1050   AST 16 11/12/2022 1050   ALT 6  11/12/2022 1050   BILITOT 0.4 11/12/2022 1050       RADIOGRAPHIC STUDIES: CT Abdomen Pelvis Wo Contrast  Result Date: 11/10/2022 CLINICAL DATA:  Recurrent kidney carcinoma * Tracking Code: BO * EXAM: CT ABDOMEN AND PELVIS WITHOUT CONTRAST TECHNIQUE: Multidetector CT imaging of the abdomen and pelvis was performed following the standard protocol without IV contrast. RADIATION DOSE REDUCTION: This exam was performed according to the departmental dose-optimization program which includes automated exposure control, adjustment of the mA and/or kV according to patient size and/or use of iterative reconstruction technique. COMPARISON:  CT 08/14/2022 and older FINDINGS: Lower chest: Please see separate dictation of chest CT from same day. Hepatobiliary: With limits of this non IV contrast exam, grossly the liver this is without space-occupying lesion. The gallbladder is distended with stones. Pancreas: Unremarkable. No pancreatic ductal dilatation or surrounding inflammatory changes. Spleen: Normal in size without focal abnormality. Small lower splenic calcification identified consistent with old granulomatous disease. Adrenals/Urinary Tract: Adrenal glands are preserved. No abnormal calcifications  seen in the right kidney. No obvious mass or ductal dilatation. Left kidney once again has some exophytic cysts As on the previous examination which are simple and benign. There once again are some complex lesions along the inferior anterior aspect of the left kidney. Previously lesion which measured 2.5 x 2.5 cm, today on series 2, image 70 measures 2.5 by 2.5 cm. The lesion just caudal to this measured 2.9 x 2.5 cm on the prior and today 2.7 by 2.9 cm, again not significantly changed when adjusting for technique. No left renal or ureteral stone. Preserved contours of the urinary bladder. Stomach/Bowel: Left-sided colonic diverticula and stool. The small and large bowel are nondilated. Stomach is collapsed.  Vascular/Lymphatic: Diffuse vascular calcifications. No specific abnormal lymph node enlargement identified in the abdomen and pelvis. Reproductive: Uterus and bilateral adnexa are unremarkable. Other: Anasarca.  No significant ascites. Musculoskeletal: Curvature of the spine. Multifocal degenerative changes of the spine and pelvis. Osteopenia. Stable sclerotic lesion along the right iliac bone consistent with a bone island. There is compression of the L1 vertebral body greater than L4 with sclerotic margins. Unchanged from prior IMPRESSION: With the limits of non IV contrast, grossly no significant interval change. Aggressive left-sided renal masses are stable. No new mass lesion, fluid collection or lymph node enlargement. Gallstones. Colonic diverticula. Previous enhancing uterine lesion is not seen on this noncontrast examination. Additional workup as clinically appropriate. Please see separate dictation of chest CT Electronically Signed   By: Jill Side M.D.   On: 11/10/2022 13:03   CT Chest Wo Contrast  Result Date: 11/10/2022 CLINICAL DATA:  Metastatic renal cell carcinoma. * Tracking Code: BO * EXAM: CT CHEST WITHOUT CONTRAST TECHNIQUE: Multidetector CT imaging of the chest was performed following the standard protocol without IV contrast. RADIATION DOSE REDUCTION: This exam was performed according to the departmental dose-optimization program which includes automated exposure control, adjustment of the mA and/or kV according to patient size and/or use of iterative reconstruction technique. COMPARISON:  CT 08/14/2022 and older FINDINGS: Cardiovascular: Heart is enlarged. There is a dilated ascending aorta once again approaching 4.2 x 4.9 cm. Similar to previous when adjusting for technique. Coronary artery calcifications are seen. Trace pericardial fluid. Diffuse vascular calcifications along the aorta. Mediastinum/Nodes: No specific abnormal lymph node enlargement seen in the axillary region, hila.  There are some small nodes in the mediastinum. Precarinal node for example on series 504, image 22 measures 11 by 8 mm. Previously this same node would have measured 14 by 9 mm. There are some calcified nodes as well in the right side of the mediastinum and right hilum. Stable dense left-sided thyroid nodule. Lungs/Pleura: New tiny right pleural effusion. Breathing motion. Some chronic right-sided lung changes identified. Once again there is a peripheral spiculated nodule with some central calcification in the right upper lobe laterally measuring 15 by 7 mm and previously 15 x 7 mm. There is a 4 mm right lower lobe nodule on series 505, image 62 which is new from previous. There is also a 3 mm nodule laterally right lower lobe on image 78 of series 5 which is also new. Once again there is nodular pleural thickening throughout the left hemithorax. Maximal thickness previously in the upper thorax medially and posteriorly head measured up to 3.7 cm in thickness. Today on series 504, image 13 thickness of 4 cm. More caudal thickness was measured at 3.7 cm and today when measured in a similar fashion on series 504, image 46 thickness approaches 3.7  cm. Nodules tracking along the interlobar fissure. Overall relatively similar when adjusting for technique. There is some nodules extending along the lower anterior mediastinum as well. Upper Abdomen: Please see separate dictation of abdomen and pelvis CT. Left-sided renal mass. Musculoskeletal: Slight curvature of the spine. Scattered degenerative changes. Osteopenia. IMPRESSION: Once again extensive nodular pleural thickening throughout the left hemithorax this has with known spread of disease. Extent and distribution is relatively similar when adjusting for technique. New tiny right pleural effusion with some small right-sided lung nodules, new from previous. Simple attention on follow-up. Please see separate dictation of the abdomen and pelvis CT Aortic Atherosclerosis  (ICD10-I70.0) and Emphysema (ICD10-J43.9). Electronically Signed   By: Jill Side M.D.   On: 11/10/2022 12:48    ASSESSMENT AND PLAN: This is a 88 years old white female who originally from [place] with metastatic renal cell carcinoma presented with large left renal mass in addition to left hemithorax pleural-based metastasis as well as left hilar and mediastinal lymphadenopathy diagnosed in 2022.  Her molecular studies showed no actionable mutations and PD-L1 expression was negative. The patient had MRI of the brain performed recently that showed no evidence of metastatic disease to the brain. She started systemic treatment with immunotherapy with ipilimumab 1 mg/KG as well as nivolumab 3 mg/KG every 3 weeks status post 4 cycles.  This treatment was discontinued secondary to disease progression. She is currently on treatment with a combination of nivolumab 480 Mg IV every 4 weeks in addition to Cabometyx 40 mg p.o. daily status post 18 cycles. Her dose of Cabometyx was reduced to 20 mg p.o. daily starting cycle #6 secondary to drug-induced diarrhea. The patient has been tolerating this treatment well with no concerning adverse effects. I recommended for her to proceed with cycle #19 today as planned. For the anemia, she will continue on the oral iron tablets. For the acid reflux she is currently on Protonix and she was also advised to use probiotic for the bloating. I will see her back for follow-up visit in 4 weeks for evaluation before starting cycle #20. The patient was advised to call immediately if she has any other concerning symptoms in the interval.  The patient voices understanding of current disease status and treatment options and is in agreement with the current care plan. The total time spent in the appointment was 20 minutes.  All questions were answered. The patient knows to call the clinic with any problems, questions or concerns. We can certainly see the patient much sooner if  necessary.   Disclaimer: This note was dictated with voice recognition software. Similar sounding words can inadvertently be transcribed and may not be corrected upon review.

## 2022-12-10 NOTE — Patient Instructions (Signed)
[facility]  Discharge Instructions: Thank you for choosing [facility] to provide your oncology and hematology care.   If you have a lab appointment with the Cancer Center, please go directly to the Cancer Center and check in at the registration area.   Wear comfortable clothing and clothing appropriate for easy access to any Portacath or PICC line.   We strive to give you quality time with your provider. You may need to reschedule your appointment if you arrive late (15 or more minutes).  Arriving late affects you and other patients whose appointments are after yours.  Also, if you miss three or more appointments without notifying the office, you may be dismissed from the clinic at the provider's discretion.      For prescription refill requests, have your pharmacy contact our office and allow 72 hours for refills to be completed.    Today you received the following chemotherapy and/or immunotherapy agents opdivo      To help prevent nausea and vomiting after your treatment, we encourage you to take your nausea medication as directed.  BELOW ARE SYMPTOMS THAT SHOULD BE REPORTED IMMEDIATELY: *FEVER GREATER THAN 100.4 F (38 C) OR HIGHER *CHILLS OR SWEATING *NAUSEA AND VOMITING THAT IS NOT CONTROLLED WITH YOUR NAUSEA MEDICATION *UNUSUAL SHORTNESS OF BREATH *UNUSUAL BRUISING OR BLEEDING *URINARY PROBLEMS (pain or burning when urinating, or frequent urination) *BOWEL PROBLEMS (unusual diarrhea, constipation, pain near the anus) TENDERNESS IN MOUTH AND THROAT WITH OR WITHOUT PRESENCE OF ULCERS (sore throat, sores in mouth, or a toothache) UNUSUAL RASH, SWELLING OR PAIN  UNUSUAL VAGINAL DISCHARGE OR ITCHING   Items with * indicate a potential emergency and should be followed up as soon as possible or go to the Emergency Department if any problems should occur.  Please show the CHEMOTHERAPY ALERT CARD or IMMUNOTHERAPY ALERT CARD at check-in  to the Emergency Department and triage nurse.  Should you have questions after your visit or need to cancel or reschedule your appointment, please contact [facility]  Dept: [phone]  and follow the prompts.  Office hours are 8:00 a.m. to 4:30 p.m. Monday - Friday. Please note that voicemails left after 4:00 p.m. may not be returned until the following business day.  We are closed weekends and major holidays. You have access to a nurse at all times for urgent questions. Please call the main number to the clinic Dept: [phone] and follow the prompts.   For any non-urgent questions, you may also contact your provider using MyChart. We now offer e-Visits for anyone 18 and older to request care online for non-urgent symptoms. For details visit [email or web address].   Also download the MyChart app! Go to the app store, search "MyChart", open the app, select [place], and log in with your MyChart username and password.   

## 2022-12-11 ENCOUNTER — Other Ambulatory Visit (HOSPITAL_COMMUNITY): Payer: Self-pay

## 2022-12-11 LAB — T4: T4, Total: 8.3 ug/dL (ref 4.5–12.0)

## 2022-12-16 ENCOUNTER — Other Ambulatory Visit (HOSPITAL_COMMUNITY): Payer: Self-pay

## 2022-12-16 ENCOUNTER — Other Ambulatory Visit: Payer: Self-pay | Admitting: Nurse Practitioner

## 2022-12-31 ENCOUNTER — Other Ambulatory Visit (HOSPITAL_COMMUNITY): Payer: Self-pay

## 2022-12-31 ENCOUNTER — Other Ambulatory Visit: Payer: Self-pay

## 2022-12-31 ENCOUNTER — Other Ambulatory Visit: Payer: Self-pay | Admitting: Internal Medicine

## 2022-12-31 MED ORDER — CABOMETYX 20 MG PO TABS
20.0000 mg | ORAL_TABLET | Freq: Every day | ORAL | 2 refills | Status: DC
  Filled 2022-12-31: qty 30, 30d supply, fill #0
  Filled 2023-01-02: qty 20, 20d supply, fill #0
  Filled 2023-01-23 (×2): qty 30, 30d supply, fill #1
  Filled 2023-02-13: qty 30, 30d supply, fill #2

## 2023-01-01 ENCOUNTER — Other Ambulatory Visit: Payer: Self-pay

## 2023-01-01 ENCOUNTER — Other Ambulatory Visit (HOSPITAL_COMMUNITY): Payer: Self-pay

## 2023-01-02 ENCOUNTER — Telehealth: Payer: Self-pay | Admitting: Pharmacy Technician

## 2023-01-02 ENCOUNTER — Other Ambulatory Visit: Payer: Self-pay

## 2023-01-02 ENCOUNTER — Other Ambulatory Visit (HOSPITAL_COMMUNITY): Payer: Self-pay

## 2023-01-02 NOTE — Telephone Encounter (Signed)
Oral Oncology Patient Advocate Encounter  Received notification that the request for prior authorization for the new ndc of Cabometyx ([id number]) has been denied due to plan does not cover this medication.     Jinger Neighbors, CPhT-Adv Oncology Pharmacy Patient Advocate [facility] Direct Number: [phone]  Fax: [phone]

## 2023-01-02 NOTE — Telephone Encounter (Signed)
Oral Oncology Patient Advocate Encounter   Received notification that prior authorization for Cabometyx is required due to a change in [facility] by the manufacturer   PA submitted on 01/02/23 Submitted via phone. Case ID: [id number]  Status is pending     Jinger Neighbors, CPhT-Adv Oncology Pharmacy Patient Advocate [facility] Direct Number: [phone]  Fax: [phone]

## 2023-01-03 ENCOUNTER — Other Ambulatory Visit (HOSPITAL_COMMUNITY): Payer: Self-pay

## 2023-01-03 ENCOUNTER — Other Ambulatory Visit: Payer: Self-pay

## 2023-01-04 NOTE — Progress Notes (Unsigned)
[facility] OFFICE PROGRESS NOTE  [name], MD [address] [place] [address]  DIAGNOSIS: Metastatic renal cell carcinoma presented with large left renal mass in addition to significant left hemothorax pleural-based metastasis as well as left hilar and mediastinal lymphadenopathy diagnosed in August 2022.   Biomarker Findings Microsatellite status - MS-Stable Tumor Mutational Burden - 2 Muts/Mb Genomic Findings For a complete list of the genes assayed, please refer to the Appendix. MTAP loss CDKN2A/B CDKN2A loss 8 Disease relevant genes with no reportable Alteration   PD-L1 expression 0%  PRIOR THERAPY: Systemic immunotherapy with ipilimumab 1 mg/KG in addition to nivolumab 3 mg/KG every 3 weeks for 4 cycles followed by maintenance treatment with nivolumab.  Status post 3 cycles.  First dose started on 05/22/2021.  This treatment was discontinued secondary to disease progression.  CURRENT THERAPY:  Opdivo 480 Mg IV every 4 weeks with Cabometyx 40 mg p.o. daily.  First dose August 21, 2021.  Status post 19 cycles of treatment.  Her dose of Cabometyx will be reduced to 20 mg p.o. daily starting 01/08/2022 secondary to persistent diarrhea.   INTERVAL HISTORY: Gail Fitzgerald 88 y.o. female returns  to the clinic today for a follow-up visit accompanied by her son.  The patient is feeling well today without any new concerning complaints. According to her and her son, everything is the same. She has chronic left sided chest discomfort secondary to her extensive pleural disease. It is also exacerbated by taking a deep breath. She does take Tylenol PRN. She is also prescribed celebrex. She is needing a refill. She follows with palliative care and is scheduled to see them today.  She continues to localize her pain in the bandlike region in the left ribs.  Her pain is exacerbated by laying on her left side. She had a scan 3/1  Last scan 3/1. Order***  The  patient was last seen in clinic 4 weeks ago by Dr. Arbutus Ped and palliative care. The patient is currently undergoing infusions once a month with nivolumab which she tolerates well.  The patient is also on oral treatment with cabometryx. However, insurance is *** coverage for cabometryx. This is dose reduced to 20 mg p.o. daily due to diarrhea. This dose was reduced in May 2023.  She is tolerating this better and denies recent significant diarrhea. She may get loose stool now and then but nothing persistent. She initially was having decreased appetite and taste alterations.  She does not like the taste of boost and Ensure.  More recently her taste and appetite has improved. She gained *** pound.  She denies any fever, chills, night sweats, or unexplained weight loss. She denies any nausea, vomiting, or constipation.  Denies any changes with her bladder habits.  Denies any rashes or skin changes.  She checks her BP twice a day at home. She thinks she gets nervous when she comes to the clinic as her BP is typically high. She is here today for evaluation and repeat blood work before undergoing cycle #16 of nivolumab.   MEDICAL HISTORY: Past Medical History:  Diagnosis Date   Hypertension     ALLERGIES:  has No Known Allergies.  MEDICATIONS:  Current Outpatient Medications  Medication Sig Dispense Refill   acetaminophen (TYLENOL) 325 MG tablet Take 650 mg by mouth every 6 (six) hours as needed for headache.     Aflibercept (EYLEA IO) Inject 1 Dose into the eye every 30 (thirty) days. IO left eye  monthly     amLODipine (NORVASC) 10 MG tablet Take 10 mg by mouth daily.     benazepril (LOTENSIN) 20 MG tablet Take 20 mg by mouth daily.     cabozantinib (CABOMETYX) 20 MG tablet Take 1 tablet (20 mg total) by mouth daily. Take on an empty stomach, 1 hour before or 2 hours after meals. 30 tablet 2   celecoxib (CELEBREX) 100 MG capsule TAKE 1 CAPSULE BY MOUTH EVERY DAY 30 capsule 2   ferrous sulfate 325 (65 FE)  MG EC tablet TAKE 1 TABLET BY MOUTH EVERY DAY WITH BREAKFAST 90 tablet 1   metoprolol tartrate (LOPRESSOR) 50 MG tablet Take 50 mg by mouth 2 (two) times daily.     mirtazapine (REMERON) 7.5 MG tablet TAKE 1 TABLET BY MOUTH EVERYDAY AT BEDTIME 90 tablet 1   Misc. Devices (WHEELCHAIR) MISC Light weight     OVER THE COUNTER MEDICATION Take 1 tablet by mouth daily as needed (for stomach discomfort). Allochol - (Herbal Supplement) Multivitamin with : Magnesium, potassium, garlic and charcoal. (Russian Medication)     pantoprazole (PROTONIX) 40 MG tablet Take 1 tablet (40 mg total) by mouth daily. 90 tablet 1   prochlorperazine (COMPAZINE) 10 MG tablet Take 1 tablet (10 mg total) by mouth every 6 (six) hours as needed for nausea or vomiting. 30 tablet 2   simvastatin (ZOCOR) 20 MG tablet Take 20 mg by mouth every evening.     UNABLE TO FIND Take 1 tablet by mouth daily as needed (chest pain (angina)). Med Name: Validol (Menthyl isovalerate and menthol) (Russian Medication)     Valerian Root 100 MG CAPS Take 1 capsule by mouth daily as needed (for anxiety).     No current facility-administered medications for this visit.    SURGICAL HISTORY:  Past Surgical History:  Procedure Laterality Date   IR THORACENTESIS ASP PLEURAL SPACE W/IMG GUIDE  04/05/2021   THORACENTESIS N/A 04/12/2021   Procedure: Alanson Puls;  Surgeon: Josephine Igo, DO;  Location: [facility];  Service: Pulmonary;  Laterality: N/A;    REVIEW OF SYSTEMS:   Review of Systems  Constitutional: Negative for appetite change, chills, fatigue, fever and unexpected weight change.  HENT:   Negative for mouth sores, nosebleeds, sore throat and trouble swallowing.   Eyes: Negative for eye problems and icterus.  Respiratory: Negative for cough, hemoptysis, shortness of breath and wheezing.   Cardiovascular: Negative for chest pain and leg swelling.  Gastrointestinal: Negative for abdominal pain, constipation, diarrhea, nausea and  vomiting.  Genitourinary: Negative for bladder incontinence, difficulty urinating, dysuria, frequency and hematuria.   Musculoskeletal: Negative for back pain, gait problem, neck pain and neck stiffness.  Skin: Negative for itching and rash.  Neurological: Negative for dizziness, extremity weakness, gait problem, headaches, light-headedness and seizures.  Hematological: Negative for adenopathy. Does not bruise/bleed easily.  Psychiatric/Behavioral: Negative for confusion, depression and sleep disturbance. The patient is not nervous/anxious.     PHYSICAL EXAMINATION:  There were no vitals taken for this visit.  ECOG PERFORMANCE STATUS: {CHL ONC ECOG [id number]  Physical Exam  Constitutional: Oriented to person, place, and time and well-developed, well-nourished, and in no distress. No distress.  HENT:  Head: Normocephalic and atraumatic.  Mouth/Throat: Oropharynx is clear and moist. No oropharyngeal exudate.  Eyes: Conjunctivae are normal. Right eye exhibits no discharge. Left eye exhibits no discharge. No scleral icterus.  Neck: Normal range of motion. Neck supple.  Cardiovascular: Normal rate, regular rhythm, normal heart sounds and intact distal pulses.  Pulmonary/Chest: Effort normal and breath sounds normal. No respiratory distress. No wheezes. No rales.  Abdominal: Soft. Bowel sounds are normal. Exhibits no distension and no mass. There is no tenderness.  Musculoskeletal: Normal range of motion. Exhibits no edema.  Lymphadenopathy:    No cervical adenopathy.  Neurological: Alert and oriented to person, place, and time. Exhibits normal muscle tone. Gait normal. Coordination normal.  Skin: Skin is warm and dry. No rash noted. Not diaphoretic. No erythema. No pallor.  Psychiatric: Mood, memory and judgment normal.  Vitals reviewed.  LABORATORY DATA: Lab Results  Component Value Date   WBC 9.4 12/10/2022   HGB 12.0 12/10/2022   HCT 36.2 12/10/2022   MCV 93.1 12/10/2022    PLT 261 12/10/2022      Chemistry      Component Value Date/Time   NA 130 (L) 12/10/2022 0816   K 4.5 12/10/2022 0816   CL 95 (L) 12/10/2022 0816   CO2 25 12/10/2022 0816   BUN 15 12/10/2022 0816   CREATININE 0.59 12/10/2022 0816      Component Value Date/Time   CALCIUM 9.1 12/10/2022 0816   ALKPHOS 77 12/10/2022 0816   AST 17 12/10/2022 0816   ALT 8 12/10/2022 0816   BILITOT 0.4 12/10/2022 0816       RADIOGRAPHIC STUDIES:  No results found.   ASSESSMENT/PLAN:  This is a very pleasant 88 year old Caucasian female originally from [place].  She has been diagnosed with metastatic renal cell carcinoma.  She presented with a large left renal mass in addition to left hemithorax pleural-based metastasis as well as left hilar mediastinal lymphadenopathy.  She was diagnosed in 2022.  Her molecular studies show no actionable mutations and her PD-L1 expression is negative.  The patient is currently undergoing treatment with immunotherapy with ipilimumab 1 mg/kg as well as nivolumab 3 mg/kg IV every 3 weeks status post 4 cycles treatment was discontinued due to disease progression.   The patient had disease progression after cycle #4 with left-sided pleural tumor with similar size of the left renal mass.  Dr. Arbutus Ped recommended adding Cabometyx 40 mg p.o. daily with her nivolumab IV every 4 weeks.  She started Cabometyx on 08/21/21.  She is status post 19 cycles.  Dr. Arbutus Ped reduce the dose to 20 mg p.o. daily at her appointment on 01/08/2022 due to intolerance with diarrhea    Labs were reviewed.  Recommend that she continue on the same treatment at the same dose.   She will continue using Tylenol for pain control.  She has Celebrex if needed. Messaged palliative care about requiring the refill today.  She will continue to use Imodium if needed for diarrhea.  Fortunately, appetite and diarrhea has improved. ***  Her BP is often high I the clinic. Will recheck his BP in the infusion room  today.    We will see her back for a follow up visit in 4 weeks for evaluation and repeat blood work before undergoing cycle #21  Order scan***  The patient was advised to call immediately if she has any concerning symptoms in the interval. The patient voices understanding of current disease status and treatment options and is in agreement with the current care plan. All questions were answered. The patient knows to call the clinic with any problems, questions or concerns. We can certainly see the patient much sooner if necessary                 No orders of the defined types  were placed in this encounter.    I spent {CHL ONC TIME VISIT - ZOXWR:[id number]} counseling the patient face to face. The total time spent in the appointment was {CHL ONC TIME VISIT - BJYNW:[id number]}.  Jasiri Hanawalt L Jenkins Risdon, PA-C 01/04/23

## 2023-01-06 NOTE — Progress Notes (Unsigned)
Palliative Medicine [facility]  [phone] [phone]) [phone]   Name: Gail Fitzgerald Date: 01/06/2023 MRN: [id number]  DOB: 1935/05/17  Patient Care Team: [name], MD as PCP - General (Family Medicine)    REASON FOR CONSULTATION: Gail Fitzgerald is a 88 y.o. 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  Now actively receiving Opdivo and Cabometyx.  Palliative ask to see for symptom management and goals of care.    SOCIAL HISTORY:    Gail Fitzgerald reports that she has never smoked. She has never used smokeless tobacco. She reports that she does not currently use alcohol. She reports that she does not currently use drugs.  ADVANCE DIRECTIVES:  Patient does not have advanced directives.  Does not wish to discuss.  CODE STATUS: Full code  PAST MEDICAL HISTORY: Past Medical History:  Diagnosis Date   Hypertension     PAST SURGICAL HISTORY:  Past Surgical History:  Procedure Laterality Date   IR THORACENTESIS ASP PLEURAL SPACE W/IMG GUIDE  04/05/2021   THORACENTESIS N/A 04/12/2021   Procedure: Alanson Puls;  Surgeon: Josephine Igo, DO;  Location: [facility];  Service: Pulmonary;  Laterality: N/A;    HEMATOLOGY/ONCOLOGY HISTORY:  Oncology History  Renal cell carcinoma, left (HCC)  05/09/2021 Initial Diagnosis   Renal cell carcinoma, left (HCC)   07/24/2021 -  Chemotherapy   Patient is on Treatment Plan : RENAL CELL Nivolumab (480) q28d       ALLERGIES:  has No Known Allergies.  MEDICATIONS:  Current Outpatient Medications  Medication Sig Dispense Refill   acetaminophen (TYLENOL) 325 MG tablet Take 650 mg by mouth every 6 (six) hours as needed for headache.     Aflibercept (EYLEA IO) Inject 1 Dose into the eye every 30 (thirty)  days. IO left eye monthly     amLODipine (NORVASC) 10 MG tablet Take 10 mg by mouth daily.     benazepril (LOTENSIN) 20 MG tablet Take 20 mg by mouth daily.     cabozantinib (CABOMETYX) 20 MG tablet Take 1 tablet (20 mg total) by mouth daily. Take on an empty stomach, 1 hour before or 2 hours after meals. 30 tablet 2   celecoxib (CELEBREX) 100 MG capsule TAKE 1 CAPSULE BY MOUTH EVERY DAY 30 capsule 2   ferrous sulfate 325 (65 FE) MG EC tablet TAKE 1 TABLET BY MOUTH EVERY DAY WITH BREAKFAST 90 tablet 1   metoprolol tartrate (LOPRESSOR) 50 MG tablet Take 50 mg by mouth 2 (two) times daily.     mirtazapine (REMERON) 7.5 MG tablet TAKE 1 TABLET BY MOUTH EVERYDAY AT BEDTIME 90 tablet 1   Misc. Devices (WHEELCHAIR) MISC Light weight     OVER THE COUNTER MEDICATION Take 1 tablet by mouth daily as needed (for stomach discomfort). Allochol - (Herbal Supplement) Multivitamin with : Magnesium, potassium, garlic and charcoal. (Russian Medication)     pantoprazole (PROTONIX) 40 MG tablet Take 1 tablet (40 mg total) by mouth daily. 90 tablet 1   prochlorperazine (COMPAZINE) 10 MG tablet Take 1 tablet (10 mg total) by mouth every 6 (six) hours as needed for nausea or vomiting. 30 tablet 2   simvastatin (ZOCOR) 20 MG tablet Take 20 mg by mouth every evening.     UNABLE TO FIND Take 1 tablet by mouth daily as needed (chest pain (angina)). Med Name: Angeline Slim (  Menthyl isovalerate and menthol) (Russian Medication)     Valerian Root 100 MG CAPS Take 1 capsule by mouth daily as needed (for anxiety).     No current facility-administered medications for this visit.   Facility-Administered Medications Ordered in Other Visits  Medication Dose Route Frequency Provider Last Rate Last Admin   0.9 %  sodium chloride infusion   Intravenous Once Si Gaul, MD        VITAL SIGNS: There were no vitals taken for this visit. There were no vitals filed for this visit.   Estimated body mass index is 18.86 kg/m as  calculated from the following:   Height as of 12/10/22: 5\' 1"  (1.549 m).   Weight as of an earlier encounter on 01/07/23: 45.3 kg.  PERFORMANCE STATUS (ECOG) : 2 - Symptomatic, <50% confined to bed  Physical Exam General: NAD, sitting in wheelchair  Cardiovascular: RRR  Pulmonary:normal breathing pattern  Neurological: alert, oriented x 4, [place] interpreter present  IMPRESSION: Gail Fitzgerald presents to visit with a [place] interpreter. She is alert, oriented, and in good spirits.    Neoplasm related pain Gail Fitzgerald reports worsening aches and pains. She takes Celebrex daily and expresses concern that maybe her body is "getting used to it." Discussed with patient that she may increase Celebrex to twice daily. She prefers not to do this because her desire is to take less pills rather than more. Verbalizes understanding that she may increase if/when desired.  She takes Tylenol a couple of times per day on an as-needed basis and reports this helps. She also tries to stay ahead of the pain by remaining active, walking outside and gardening.   2.   Dyspepsia Gail Fitzgerald reports that it is difficult to eat large amounts of food because she feels like it processes through her body slowly. Denies constipation or diarrhea. Does not take Protonix consistently. States that she does not have heart burn.  Patient tries to control stomach discomfort with diet. She reports staying away from large amounts of meat and eating more fruits and vegetables helps.  Weight remains stable at this time 44.3 kg (4/2) and 45.3 kg (today). Will continue to monitor closely.   Patient knows to contact office with any symptom management needs.    PLAN: Tylenol 1000 mg twice daily as needed.  Celebrex 100 mg daily.  May take an additional dose daily for increased pain. Protonix 40 mg daily for dyspepsia.  Continue healthy food choices to promote gut comfort and motility. Palliative will plan to see patient  back in 6-8 weeks in collaboration to other oncology appointments. Aware to contact office sooner with any symptom management needs.   Patient expressed understanding and was in agreement with this plan. She also understands that she can call the clinic at any time with any questions, concerns, or complaints.   Visit consisted of counseling and education dealing with the complex and emotionally intense issues of symptom management and palliative care in the setting of serious and potentially life-threatening illness.Greater than 50%  of this time was spent counseling and coordinating care related to the above assessment and plan.  Signed by: Katy Apo, RN MSN [facility] / NP Student   Willette Alma, AGPCNP-BC  Palliative Medicine Team/[facility]

## 2023-01-07 ENCOUNTER — Other Ambulatory Visit: Payer: Self-pay

## 2023-01-07 ENCOUNTER — Other Ambulatory Visit: Payer: Self-pay | Admitting: Physician Assistant

## 2023-01-07 ENCOUNTER — Inpatient Hospital Stay: Payer: Medicaid Other | Admitting: Physician Assistant

## 2023-01-07 ENCOUNTER — Inpatient Hospital Stay (HOSPITAL_BASED_OUTPATIENT_CLINIC_OR_DEPARTMENT_OTHER): Payer: Medicaid Other | Admitting: Nurse Practitioner

## 2023-01-07 ENCOUNTER — Inpatient Hospital Stay: Payer: Medicaid Other

## 2023-01-07 VITALS — BP 163/60 | HR 72 | Temp 97.5°F | Resp 18 | Wt 99.8 lb

## 2023-01-07 VITALS — BP 156/74 | HR 65 | Temp 98.2°F | Resp 16

## 2023-01-07 DIAGNOSIS — R530 Neoplastic (malignant) related fatigue: Secondary | ICD-10-CM | POA: Diagnosis not present

## 2023-01-07 DIAGNOSIS — C642 Malignant neoplasm of left kidney, except renal pelvis: Secondary | ICD-10-CM

## 2023-01-07 DIAGNOSIS — G893 Neoplasm related pain (acute) (chronic): Secondary | ICD-10-CM | POA: Diagnosis not present

## 2023-01-07 DIAGNOSIS — R630 Anorexia: Secondary | ICD-10-CM

## 2023-01-07 DIAGNOSIS — Z5112 Encounter for antineoplastic immunotherapy: Secondary | ICD-10-CM | POA: Diagnosis not present

## 2023-01-07 DIAGNOSIS — Z515 Encounter for palliative care: Secondary | ICD-10-CM | POA: Diagnosis not present

## 2023-01-07 LAB — CMP (CANCER CENTER ONLY)
ALT: 8 U/L (ref 0–44)
AST: 20 U/L (ref 15–41)
Albumin: 3.8 g/dL (ref 3.5–5.0)
Alkaline Phosphatase: 79 U/L (ref 38–126)
Anion gap: 10 (ref 5–15)
BUN: 14 mg/dL (ref 8–23)
CO2: 24 mmol/L (ref 22–32)
Calcium: 9 mg/dL (ref 8.9–10.3)
Chloride: 97 mmol/L — ABNORMAL LOW (ref 98–111)
Creatinine: 0.53 mg/dL (ref 0.44–1.00)
GFR, Estimated: >60 mL/min (ref >60)
Glucose, Bld: 115 mg/dL — ABNORMAL HIGH (ref 70–99)
Potassium: 4.2 mmol/L (ref 3.5–5.1)
Sodium: 131 mmol/L — ABNORMAL LOW (ref 135–145)
Total Bilirubin: 0.4 mg/dL (ref 0.3–1.2)
Total Protein: 6.8 g/dL (ref 6.5–8.1)

## 2023-01-07 LAB — CBC WITH DIFFERENTIAL (CANCER CENTER ONLY)
Abs Immature Granulocytes: 0.02 10*3/uL (ref 0.00–0.07)
Basophils Absolute: 0.1 10*3/uL (ref 0.0–0.1)
Basophils Relative: 1 %
Eosinophils Absolute: 0 10*3/uL (ref 0.0–0.5)
Eosinophils Relative: 1 %
HCT: 34.7 % — ABNORMAL LOW (ref 36.0–46.0)
Hemoglobin: 11.5 g/dL — ABNORMAL LOW (ref 12.0–15.0)
Immature Granulocytes: 0 %
Lymphocytes Relative: 18 %
Lymphs Abs: 1.4 10*3/uL (ref 0.7–4.0)
MCH: 30.7 pg (ref 26.0–34.0)
MCHC: 33.1 g/dL (ref 30.0–36.0)
MCV: 92.5 fL (ref 80.0–100.0)
Monocytes Absolute: 0.9 10*3/uL (ref 0.1–1.0)
Monocytes Relative: 11 %
Neutro Abs: 5.5 10*3/uL (ref 1.7–7.7)
Neutrophils Relative %: 69 %
Platelet Count: 258 10*3/uL (ref 150–400)
RBC: 3.75 MIL/uL — ABNORMAL LOW (ref 3.87–5.11)
RDW: 14.6 % (ref 11.5–15.5)
WBC Count: 7.9 10*3/uL (ref 4.0–10.5)
nRBC: 0 % (ref 0.0–0.2)

## 2023-01-07 LAB — TSH: TSH: 2.246 u[IU]/mL (ref 0.350–4.500)

## 2023-01-07 MED ORDER — SODIUM CHLORIDE 0.9 % IV SOLN
480.0000 mg | Freq: Once | INTRAVENOUS | Status: AC
  Administered 2023-01-07: 480 mg via INTRAVENOUS
  Filled 2023-01-07: qty 48

## 2023-01-07 MED ORDER — MIRTAZAPINE 7.5 MG PO TABS
7.5000 mg | ORAL_TABLET | Freq: Every day | ORAL | 2 refills | Status: DC
Start: 2023-01-07 — End: 2023-01-07

## 2023-01-07 MED ORDER — SODIUM CHLORIDE 0.9 % IV SOLN: Freq: Once | INTRAVENOUS | Status: DC

## 2023-01-07 NOTE — Patient Instructions (Signed)
[facility]  Discharge Instructions: Thank you for choosing [facility] to provide your oncology and hematology care.   If you have a lab appointment with the Cancer Center, please go directly to the Cancer Center and check in at the registration area.   Wear comfortable clothing and clothing appropriate for easy access to any Portacath or PICC line.   We strive to give you quality time with your provider. You may need to reschedule your appointment if you arrive late (15 or more minutes).  Arriving late affects you and other patients whose appointments are after yours.  Also, if you miss three or more appointments without notifying the office, you may be dismissed from the clinic at the provider's discretion.      For prescription refill requests, have your pharmacy contact our office and allow 72 hours for refills to be completed.    Today you received the following chemotherapy and/or immunotherapy agents: Nivolumab (Opdivo)   To help prevent nausea and vomiting after your treatment, we encourage you to take your nausea medication as directed.  BELOW ARE SYMPTOMS THAT SHOULD BE REPORTED IMMEDIATELY: *FEVER GREATER THAN 100.4 F (38 C) OR HIGHER *CHILLS OR SWEATING *NAUSEA AND VOMITING THAT IS NOT CONTROLLED WITH YOUR NAUSEA MEDICATION *UNUSUAL SHORTNESS OF BREATH *UNUSUAL BRUISING OR BLEEDING *URINARY PROBLEMS (pain or burning when urinating, or frequent urination) *BOWEL PROBLEMS (unusual diarrhea, constipation, pain near the anus) TENDERNESS IN MOUTH AND THROAT WITH OR WITHOUT PRESENCE OF ULCERS (sore throat, sores in mouth, or a toothache) UNUSUAL RASH, SWELLING OR PAIN  UNUSUAL VAGINAL DISCHARGE OR ITCHING   Items with * indicate a potential emergency and should be followed up as soon as possible or go to the Emergency Department if any problems should occur.  Please show the CHEMOTHERAPY ALERT CARD or IMMUNOTHERAPY ALERT CARD at  check-in to the Emergency Department and triage nurse.  Should you have questions after your visit or need to cancel or reschedule your appointment, please contact [facility]  Dept: [phone]  and follow the prompts.  Office hours are 8:00 a.m. to 4:30 p.m. Monday - Friday. Please note that voicemails left after 4:00 p.m. may not be returned until the following business day.  We are closed weekends and major holidays. You have access to a nurse at all times for urgent questions. Please call the main number to the clinic Dept: [phone] and follow the prompts.   For any non-urgent questions, you may also contact your provider using MyChart. We now offer e-Visits for anyone 64 and older to request care online for non-urgent symptoms. For details visit [email or web address].   Also download the MyChart app! Go to the app store, search "MyChart", open the app, select , and log in with your MyChart username and password.  Nivolumab Injection ??? ???????????? ????? ??? ?????????? ????????? ??????????? ??? ??????? ????????? ????? ??????????????? ????????. ?? ???????? ???????? ??????? ????????? ??? ?????????? ??????????????? ?????????? ??????. ??? ???????? ?????????????? ???????. ??? ????????? ????? ??????????? ? ?????? ?????; ???? ? ??? ????????? ???????, ?????????? ? ???????????? ????????? ??? ??????????. ???????????????? ????????? ???????? (????????): Opdivo ??? ??? ??????? ?????????? ???????????? ????????? ?? ?????? ?????? ????? ?????????? ?????????? ???????? ? ??????? ? ??? ????????? ?????????: ?????????? ?????????????? ????????? ?????? (????????? ????????? ?????? ??????? ????????); ???????????? ???????????, ????? ??? ??????? ?????, ??????????????? ???????? ?????, ????????; ????????? ??????? ??????? ??????? ? ???????; ???????????? ??????? ???????, ????? ??? ??????? ??????--????? ??? ????????? ??????; ?????????????? ???????; ????????? ???  ????????????? ??????? ?? ?????????, ?????? ?????????, ??????? ????????, ????????? ??? ???????????; ???????????? ??? ???????????? ????????????; ????????? ??????. ??? ??? ??????? ????????? ??? ?????????? ??? ????????? ????????????? ??? ????????????? ????????. ??? ????????? ???????? ? ???????? ??? ???????????. ????? ??????? ??????? ??? ????? ????????????? ??????????? ?????????? ? ????????? (  MedGuide). ?????? ??? ??????? ??????????? ???????? ??? ??????????. ???????? ??????????? ?????????? ? ??????????? ?????????? ????? ????????? ?????. ???????? ?? ?? ??? ?? ???????????? ?????????? ??? ????? ????????? ????? ? 12 ???, ??????? ????????? ???? ????????????????. ?????????????: ???? ??? ???????, ??? ?? ????????? ?????????? ???? ????? ?????????, ?????????? ?????????? ? ????????????????? ????? ??? ???????? ????????? ??? ???????? ?????????? ??????. ??????????: ??? ????????? ????????????? ?????? ??? ???. ?? ???????? ?? ? ??????? ??????. ??? ?????????? ? ?????? ???????? ?????? ?????????? ?? ????????? ????????? ?? ????? ??? ???????? ??????????? ???. ????? ?? ?????????? ???????? ?????????. ???? ?? ?? ?????? ?????? ?? ????? ? ??????????? ?????, ???????? ?? ???? ??????????? ??????????. ? ??? ??? ????????? ????? ???????? ?? ??????????????? ?????????????? ?? ?????????. ???? ???????? ?? ???????? ???? ????????? ??????????????. ???????????? ???????????? ????????? ?????? ???? ??????????? ???? ????????, ????????????? ????????, ?????????????? ?????????? ? ??????? ???????. ????? ???????? ??? ? ???????, ???????????? ??????????? ???????? ??? ??????????. ????????? ???????? ????? ???????? ?? ?????????????? ? ??????????? ???? ??????????. ?? ??? ????? ???????? ???????? ??? ????????????? ????? ?????????? ?? ????? ??????? ???? ?????????? ?? ?????? ?????????? ??? ?????????? ???????????. ?? ????? ??????? ???? ??????????, ????????, ??? ??????????? ??????? ??????? ?????. ??? ????????? ????? ??????? ??????? ?????? ???????. ??? ????? ?????????  ????? ????????? ?????? ??? ??????? ????? ?????? ?????? ?????????. ??? ????????? ??????????? ???? ??? ????????? ?????????????? ????????? ? ????????? ? ????? ??????????????? ?????????? ? ???????????? ?????????. ???? ????? ???? ???????? ??? ??????????? ?????. ????? ????????? ????? ?? ????? ????????? ????? ?????????? ??????? ??? ????????? ????????? ????. ???????? ????????? ??????? ???? ? ????????? ? ?????? ????, ??? ??? ??????????? ????????????? ????? ?? ??? ??? ? ??????????? ????????. ??????????????? ???????? ??????????? ?????????? ? ????? ????????? ??????. ??? ???????????? ??? ?????????????? ???????????? ?????????? ? ???????????? ?????????. ????? ??????? ??????? ???? ?????????? ??????? ??????? ???? ?? ????????????; ?? ?????? ???? ?????????????. ?? ????? ??????? ???? ?????????? ? ? ??????? 5 ??????? ????? ???????? ????????? ???? ????????????? ???????????? ???????? ?????? ????????????. ???????? ? ???????????? ??????????? ??????????? ??????? ????????????. ?? ????? ??????? ???? ?????????? ? ? ??????? 5 ??????? ????? ?????? ????????? ???? ?????? ??????? ??????? ??????. ????? ???????? ??????? ????? ???? ??? ?????? ????? ?????????? ???????? ???????, ? ??????? ??????? ??? ????? ?????? ???????? ??????????? ??????????: ????????????? ??????? -- ?????? ????, ???, ??????????, ???? ????, ???, ????? ??? ?????; ????? ??????, ?????? ??? ???????????? ???????; ???? ? ?????, ???????????, ??????????? ????? ??? ????????? ?? ????? ? ????????? ? ??????????? ?????? ??? ?????????? ??????; ?????????? ????????? ????? -- ????????? ???????? ??? ????????????, ??????, ???? ? ?????, ????????? ??? ???????????? ????????????, ??????????????, ????????? ???????, ???? ??? ??????. ????????? ??????? ??????????? ????? -- ???????? ????, ???????????????? ? ?????, ????????? ???????? ??? ????????????, ??????????????, ????????? ??? ???????????? ????????????, ?????????? ???????????????? ? ?????? ??? ????, ?????????? ?????????????, ??????, ????????? ?????,  ?????????? ????? ??? ?????????? ?????????? ????, ?????? ??? ?????, ?????????????????; ??????? ?? ??????? -- ???? ? ?????, ?????? ??? ???????????? ???????, ?????????????? ????????? ??? ???????? ???????; ????????? ????? (???????????????) -- ?????????? ?????????? ?????????? ????, ???? ???????? ??? ?????-??????????? ?????, ???????? ???? ??? ???????? ? ????, ????????? ???????, ?????? ??? ????; ??????????? ?????? -- ???? ? ?????? ??????????, ?????? ????????, ???????, ?????????????? ????, ???? ?????-??????? ??? ??????????? ?????, ?????????? ???? ??? ?????? ????, ????????? ???????? ??? ????????????; ????, ??????????? ??? ???????? ? ?????? ??? ??????, ???????? ????????, ????????? ??????, ??????????? ???????? ??? ????????? ????, ????????? ?????????? ??? ???????????, ????????? ???????, ????????; ????, ????????? ??????????? ? ?????????? ????????????? ?????. ??????????? ????, ??????????? ???????, ????????? ??? ?????? ???????????? ? ????????? ???? ??? ????????? ???????? ?? ???. ????????? ??? ??????? ???? ? ??????, ?????? ? ??????, ????????? ???????????, ???????, ?????; ???????? ???????, ??????? ?????? ?? ??????? ???????????? ???????? (???????? ??????????? ??????????, ???? ??? ??????????? ??? ?????????? ??????????): ???? ? ??????, ???????? ??? ??????; ??????; ????????????; ?????? ????????; ???????; ?????? ????; ???? ???????? ?? ???????? ???? ????????? ???????? ????????. ?????????? ? ????? ?? ??????? ???????????? ???????? ????????. ?? ?????? ???????? ? ???????? ???????? ?  FDA ?? ???????? [phone] ??? ??????? ??????? ??? ?????????? ??? ????????? ?????? ? ???????? ??? ???????????. ???????? ?? ????? ????????? ? ??? ????. ??????????: ???? ???????? ?? ???????? ???? ????????? ????????. ???? ? ??? ????????? ???????, ?????????? ??????? ?????????, ?????????? ? ?????, ?????????? ??? ??????? ???????????? ?????????.  2023 Elsevier/Gold Standard (2022-07-17 00:00:00)

## 2023-01-08 ENCOUNTER — Encounter: Payer: Self-pay | Admitting: Nurse Practitioner

## 2023-01-08 ENCOUNTER — Encounter: Payer: Self-pay | Admitting: Internal Medicine

## 2023-01-09 ENCOUNTER — Other Ambulatory Visit (HOSPITAL_COMMUNITY): Payer: Self-pay

## 2023-01-09 LAB — T4: T4, Total: 8.1 ug/dL (ref 4.5–12.0)

## 2023-01-20 ENCOUNTER — Other Ambulatory Visit (HOSPITAL_COMMUNITY): Payer: Self-pay

## 2023-01-23 ENCOUNTER — Other Ambulatory Visit: Payer: Self-pay

## 2023-01-23 ENCOUNTER — Other Ambulatory Visit (HOSPITAL_COMMUNITY): Payer: Self-pay

## 2023-01-29 ENCOUNTER — Ambulatory Visit (HOSPITAL_COMMUNITY)
Admission: RE | Admit: 2023-01-29 | Discharge: 2023-01-29 | Disposition: A | Discharge status: Home / Self Care | Payer: Medicaid Other | Source: Ambulatory Visit | Attending: Physician Assistant | Admitting: Physician Assistant

## 2023-01-29 DIAGNOSIS — C642 Malignant neoplasm of left kidney, except renal pelvis: Secondary | ICD-10-CM | POA: Insufficient documentation

## 2023-01-29 NOTE — Progress Notes (Signed)
[facility] OFFICE PROGRESS NOTE  [name], MD (Inactive) No address on file  DIAGNOSIS: Metastatic renal cell carcinoma presented with large left renal mass in addition to significant left hemothorax pleural-based metastasis as well as left hilar and mediastinal lymphadenopathy diagnosed in August 2022.   Biomarker Findings Microsatellite status - MS-Stable Tumor Mutational Burden - 2 Muts/Mb Genomic Findings For a complete list of the genes assayed, please refer to the Appendix. MTAP loss CDKN2A/B CDKN2A loss 8 Disease relevant genes with no reportable Alteration   PD-L1 expression 0%  PRIOR THERAPY: Systemic immunotherapy with ipilimumab 1 mg/KG in addition to nivolumab 3 mg/KG every 3 weeks for 4 cycles followed by maintenance treatment with nivolumab. Status post 3 cycles. First dose started on 05/22/2021. This treatment was discontinued secondary to disease progression.   CURRENT THERAPY: Opdivo 480 Mg IV every 4 weeks with Cabometyx 40 mg p.o. daily.  First dose August 21, 2021.  Status post 20 cycles of treatment.  Her dose of Cabometyx will be reduced to 20 mg p.o. daily starting 01/08/2022 secondary to persistent diarrhea.   INTERVAL HISTORY: Gail Fitzgerald 88 y.o. female returns to the clinic today for a follow-up visit accompanied by her interpretor and daughter. The patient is feeling well today without any new concerning complaints except for age related arthritis and more fatigue which she attributes to old age. She was still active and was walking daily but it is too hot so she is not walking as much. She does still exercise at home and is active in the garden in the mornings.   She has chronic left sided chest discomfort secondary to her extensive pleural disease. It is also exacerbated by taking a deep breath. She does take Tylenol PRN. She does not want any narcotics and does not want to take any medications addictive.  She continues to localize her  pain in the bandlike region in the left ribs which is stable.    The patient was last seen in clinic 4 weeks ago. The patient is currently undergoing infusions once a month with nivolumab which she tolerates well.  The patient is also on oral treatment with cabometryx.  This is dose reduced to 20 mg p.o. daily due to diarrhea. This dose was reduced in May 2023.  She is tolerating this better and denies recent significant diarrhea. She had some diarrhea today because she took OTC medication for constipation. In general, the patient prefers to have constipation over diarrhea, therefore, she does not want to take stool softener to keep her bowel regular as she tends to have more constipation. She initially was having decreased appetite and taste alterations. She lost a few pounds since last being seen. I restarted remeron due to her insomnia and decreased appetite at her last appointment.    She denies any fever, chills, or night sweats. Denies any changes with her bladder habits.  Denies any rashes or skin changes except she gets easy bruising.   She checks her BP twice a day at home. She recently had a restaging CT scan performed. She is here today for evaluation and to review her scan before undergoing cycle #21 of nivolumab.    MEDICAL HISTORY: Past Medical History:  Diagnosis Date   Hypertension     ALLERGIES:  has No Known Allergies.  MEDICATIONS:  Current Outpatient Medications  Medication Sig Dispense Refill   metoprolol succinate (TOPROL-XL) 50 MG 24 hr tablet Take by mouth.     acetaminophen (TYLENOL) 325  MG tablet Take 650 mg by mouth every 6 (six) hours as needed for headache.     amLODipine (NORVASC) 10 MG tablet Take 10 mg by mouth daily.     benazepril (LOTENSIN) 20 MG tablet Take 20 mg by mouth daily.     cabozantinib (CABOMETYX) 20 MG tablet Take 1 tablet (20 mg total) by mouth daily. Take on an empty stomach, 1 hour before or 2 hours after meals. 30 tablet 2   celecoxib  (CELEBREX) 100 MG capsule TAKE 1 CAPSULE BY MOUTH EVERY DAY 30 capsule 2   ferrous sulfate 325 (65 FE) MG EC tablet TAKE 1 TABLET BY MOUTH EVERY DAY WITH BREAKFAST 90 tablet 1   metoprolol tartrate (LOPRESSOR) 50 MG tablet Take 50 mg by mouth 2 (two) times daily.     mirtazapine (REMERON) 7.5 MG tablet TAKE 1 TABLET BY MOUTH EVERYDAY AT BEDTIME 90 tablet 1   Misc. Devices (WHEELCHAIR) MISC Light weight     OVER THE COUNTER MEDICATION Take 1 tablet by mouth daily as needed (for stomach discomfort). Allochol - (Herbal Supplement) Multivitamin with : Magnesium, potassium, garlic and charcoal. (Russian Medication)     pantoprazole (PROTONIX) 40 MG tablet Take 1 tablet (40 mg total) by mouth daily. 90 tablet 1   prochlorperazine (COMPAZINE) 10 MG tablet Take 1 tablet (10 mg total) by mouth every 6 (six) hours as needed for nausea or vomiting. 30 tablet 2   simvastatin (ZOCOR) 20 MG tablet Take 20 mg by mouth every evening.     UNABLE TO FIND Take 1 tablet by mouth daily as needed (chest pain (angina)). Med Name: Validol (Menthyl isovalerate and menthol) (Russian Medication)     Valerian Root 100 MG CAPS Take 1 capsule by mouth daily as needed (for anxiety).     No current facility-administered medications for this visit.    SURGICAL HISTORY:  Past Surgical History:  Procedure Laterality Date   IR THORACENTESIS ASP PLEURAL SPACE W/IMG GUIDE  04/05/2021   THORACENTESIS N/A 04/12/2021   Procedure: Alanson Puls;  Surgeon: Josephine Igo, DO;  Location: [facility];  Service: Pulmonary;  Laterality: N/A;    REVIEW OF SYSTEMS:   Review of Systems  Constitutional: Negative for appetite change, chills, fatigue, fever and unexpected weight change.  HENT:   Negative for mouth sores, nosebleeds, sore throat and trouble swallowing.   Eyes: Negative for eye problems and icterus.  Respiratory: Negative for cough, hemoptysis, shortness of breath and wheezing.   Cardiovascular: Positive for occasional  radiating pain around her left lower rib cage with certain deep breaths or certain positions.  Negative for leg swelling.   Gastrointestinal: Positive for intermittent diarrhea or constipation. Negative for abdominal pain, nausea and vomiting.  Genitourinary: Negative for bladder incontinence, difficulty urinating, dysuria, frequency and hematuria.   Musculoskeletal: Negative for back pain, gait problem, neck pain and neck stiffness.  Skin: Negative for itching and rash.  Neurological: Negative for dizziness, extremity weakness, gait problem, headaches, light-headedness and seizures.  Hematological: Negative for adenopathy. Positive for easy bruising on her upper extremities.  Psychiatric/Behavioral: Negative for confusion, depression and sleep disturbance. The patient is not nervous/anxious.   PHYSICAL EXAMINATION:  Blood pressure (!) 150/66, pulse 66, temperature 98.2 F (36.8 C), temperature source Oral, resp. rate 16, weight 95 lb 14.4 oz (43.5 kg), SpO2 97 %.  ECOG PERFORMANCE STATUS: 1-2  Physical Exam  Constitutional: Oriented to person, place, and time and cachectic appearing female and in no distress.  HENT:  Head:  Normocephalic and atraumatic.  Mouth/Throat: Oropharynx is clear and moist. No oropharyngeal exudate.  Eyes: Conjunctivae are normal. Right eye exhibits no discharge. Left eye exhibits no discharge. No scleral icterus.  Neck: Normal range of motion. Neck supple.  Cardiovascular: Normal rate, regular rhythm, normal heart sounds and intact distal pulses.   Pulmonary/Chest: Effort normal. Quiet breath sounds in left lung. No respiratory distress. No wheezes. No rales.  Abdominal: Soft. Bowel sounds are normal. Exhibits no distension and no mass. There is no tenderness.  Musculoskeletal: Normal range of motion. Exhibits no edema.  Lymphadenopathy:    No cervical adenopathy.  Neurological: Alert and oriented to person, place, and time. Exhibits muscle wasting.  examined in  the wheelchair.  Skin: Skin is warm and dry. No rash noted. Not diaphoretic. No erythema. No pallor.  Psychiatric: Mood, memory and judgment normal.   LABORATORY DATA: Lab Results  Component Value Date   WBC 8.8 02/04/2023   HGB 11.8 (L) 02/04/2023   HCT 35.9 (L) 02/04/2023   MCV 92.5 02/04/2023   PLT 278 02/04/2023      Chemistry      Component Value Date/Time   NA 129 (L) 02/04/2023 1401   K 4.7 02/04/2023 1401   CL 95 (L) 02/04/2023 1401   CO2 24 02/04/2023 1401   BUN 17 02/04/2023 1401   CREATININE 0.57 02/04/2023 1401      Component Value Date/Time   CALCIUM 8.5 (L) 02/04/2023 1401   ALKPHOS 89 02/04/2023 1401   AST 18 02/04/2023 1401   ALT 8 02/04/2023 1401   BILITOT 0.3 02/04/2023 1401       RADIOGRAPHIC STUDIES:  CT CHEST ABDOMEN PELVIS WO CONTRAST  Result Date: 02/04/2023 CLINICAL DATA:  Follow-up renal cell carcinoma EXAM: CT CHEST, ABDOMEN AND PELVIS WITHOUT CONTRAST TECHNIQUE: Multidetector CT imaging of the chest, abdomen and pelvis was performed following the standard protocol without IV contrast. RADIATION DOSE REDUCTION: This exam was performed according to the departmental dose-optimization program which includes automated exposure control, adjustment of the mA and/or kV according to patient size and/or use of iterative reconstruction technique. COMPARISON:  11/08/2022 FINDINGS: CT CHEST FINDINGS Cardiovascular: The heart is normal in size. No pericardial effusion. 4.2 cm ascending thoracic aortic aneurysm. Atherosclerotic calcifications of the aortic arch. Moderate three-vessel coronary atherosclerosis. Mediastinum/Nodes: Small mediastinal nodes, many of which are calcified, unchanged. Visualized thyroid is unremarkable. Lungs/Pleura: 14 x 8 mm partially calcified nodule in the right lung apex (series 4/image 30). Small right pleural effusion, unchanged. Extensive pleural thickening/nodularity throughout the left hemithorax, lower lobe predominant, grossly  unchanged. An index nodule in the anterior left hemithorax anterior to the heart measures 1.5 x 2.4 cm (series 2/image 40), grossly unchanged. Dominant mass in the posterior left lung base measures 5.3 x 7.3 cm (series 2/image 6), grossly unchanged. Increased interlobular septal thickening in the left lower lobe and inferior right middle lobe (series 4/image 110), nonspecific. No pneumothorax. Musculoskeletal: Degenerative changes of the thoracic spine. CT ABDOMEN PELVIS FINDINGS Hepatobiliary: Unenhanced liver is grossly unremarkable. Cholelithiasis, without associated inflammatory changes. No intrahepatic or extrahepatic ductal dilatation. Pancreas: Within normal limits. Spleen: Within normal limits Adrenals/Urinary Tract: Adrenal glands are within normal limits. Right kidney is within normal limits. 2.3 x 2.8 x 4.0 cm left lower pole renal mass, grossly unchanged, corresponding to the patient's known renal cell carcinoma. Additional left upper pole renal cysts measuring up to 4.4 cm (series 2/image 31), measuring simple fluid density, benign (Bosniak I). No follow-up is recommended. No renal  calculi or hydronephrosis. Bladder is within normal limits. Stomach/Bowel: Stomach is notable for a tiny hiatal hernia. No evidence of bowel obstruction. Appendix is not discretely visualized. No colonic wall thickening or inflammatory changes. Vascular/Lymphatic: No evidence of abdominal aortic aneurysm. Atherosclerotic calcifications of the abdominal aorta and branch vessels. The lymph nodes Reproductive: Uterus is within normal limits. Bilateral ovaries are within normal limits. Other: No abdominopelvic ascites. Musculoskeletal: Mild superior endplate compression fracture deformity at L1. Degenerative changes of the lumbar spine. IMPRESSION: 4.0 cm left lower pole renal mass, grossly unchanged, corresponding to the patient's known renal cell carcinoma. Extensive pleural tumor/metastases throughout the left hemithorax,  grossly unchanged. Small right pleural effusion, unchanged. Additional stable ancillary findings as above. Electronically Signed   By: Charline Bills M.D.   On: 02/04/2023 02:14     ASSESSMENT/PLAN:  This is a very pleasant 88 year old Caucasian female originally from [place].  She has been diagnosed with metastatic renal cell carcinoma.  She presented with a large left renal mass in addition to left hemithorax pleural-based metastasis as well as left hilar mediastinal lymphadenopathy.  She was diagnosed in 2022.  Her molecular studies show no actionable mutations and her PD-L1 expression is negative.  The patient is currently undergoing treatment with immunotherapy with ipilimumab 1 mg/kg as well as nivolumab 3 mg/kg IV every 3 weeks status post 4 cycles treatment was discontinued due to disease progression.   The patient had disease progression after cycle #4 with left-sided pleural tumor with similar size of the left renal mass.  Dr. Arbutus Ped recommended adding Cabometyx 40 mg p.o. daily with her nivolumab IV every 4 weeks.  She started Cabometyx on 08/21/21.  She is status post 21 cycles.  Dr. Arbutus Ped reduce the dose to 20 mg p.o. daily at her appointment on 01/08/2022 due to intolerance with diarrhea.   The patient recently had a restaging CT scan performed.  The patient was seen with Dr. Arbutus Ped today.  Dr. Arbutus Ped personally independently reviewed the scan discussed results with the patient today.  The scan showed no evidence of disease progression.  Recommend that she continue on the same treatment at the same dose.   She will continue using Tylenol for pain control.     We will see her back for a follow up visit in 4 weeks for evaluation and repeat blood work before undergoing cycle #22.   She will continue on remeron for the decreased appetite and weight loss.   The patient was advised to call immediately if she has any concerning symptoms in the interval. The patient voices understanding  of current disease status and treatment options and is in agreement with the current care plan. All questions were answered. The patient knows to call the clinic with any problems, questions or concerns. We can certainly see the patient much sooner if necessary     No orders of the defined types were placed in this encounter.     Mialani Reicks L Kennett Symes, PA-C 02/04/23  ADDENDUM: Hematology/Oncology Attending: I had a face-to-face encounter with the patient today.  I reviewed her records, lab, scan and recommended her care plan.  This is a very pleasant 88 years old white female with metastatic renal cell carcinoma diagnosed in August 2022 with negative PD-L1 expression.  The patient started treatment initially with induction immunotherapy with ipilimumab and nivolumab for 4 cycles followed by 3 cycles of maintenance treatment with single agent nivolumab discontinued secondary to disease progression.  She started treatment with nivolumab in addition  to Cabometyx initially at 40 mg p.o. daily and her dose of Cabometyx was reduced to 20 mg p.o. daily on Jan 08, 2022 because of diarrhea.  She has been tolerating her treatment well with no concerning adverse effects except for mild fatigue. She had repeat CT scan of the chest, abdomen and pelvis performed recently.  I personally and independently reviewed the scan and discussed the result with the patient and her family member today. Her scan showed no concerning findings for disease progression. I recommended for her to continue her current treatment with nivolumab and Cabometyx. I will see her back for follow-up visit in 4 weeks for evaluation before the next cycle of her treatment. The patient was advised to call immediately if she has any concerning symptoms in the interval. The total time spent in the appointment was 30 minutes. Disclaimer: This note was dictated with voice recognition software. Similar sounding words can inadvertently be  transcribed and may be missed upon review. Lajuana Matte, MD

## 2023-02-03 NOTE — Progress Notes (Deleted)
Palliative Medicine [facility]  Telephone:([phone]) [phone] [phone]   Name: Gail Fitzgerald Date: 02/03/2023 MRN: [id number]  DOB: August 12, 1935  Patient Care Team: [name], MD as PCP - General (Family Medicine)    REASON FOR CONSULTATION: Gail Fitzgerald is a 88 y.o. 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  Now actively receiving Opdivo and Cabometyx.  Palliative ask to see for symptom management and goals of care.    SOCIAL HISTORY:    Gail Fitzgerald reports that she has never smoked. She has never used smokeless tobacco. She reports that she does not currently use alcohol. She reports that she does not currently use drugs.  ADVANCE DIRECTIVES:  Patient does not have advanced directives.  Does not wish to discuss.  CODE STATUS: Full code  PAST MEDICAL HISTORY: Past Medical History:  Diagnosis Date   Hypertension     PAST SURGICAL HISTORY:  Past Surgical History:  Procedure Laterality Date   IR THORACENTESIS ASP PLEURAL SPACE W/IMG GUIDE  04/05/2021   THORACENTESIS N/A 04/12/2021   Procedure: Alanson Puls;  Surgeon: Josephine Igo, DO;  Location: [facility];  Service: Pulmonary;  Laterality: N/A;    HEMATOLOGY/ONCOLOGY HISTORY:  Oncology History  Renal cell carcinoma, left (HCC)  05/09/2021 Initial Diagnosis   Renal cell carcinoma, left (HCC)   07/24/2021 -  Chemotherapy   Patient is on Treatment Plan : RENAL CELL Nivolumab ([phone] q28d       ALLERGIES:  has No Known Allergies.  MEDICATIONS:  Current Outpatient Medications  Medication Sig Dispense Refill   acetaminophen (TYLENOL) 325 MG tablet Take 650 mg by mouth every 6 (six) hours as needed for headache.     Aflibercept (EYLEA IO) Inject 1 Dose into the eye every 30 (thirty)  days. IO left eye monthly     amLODipine (NORVASC) 10 MG tablet Take 10 mg by mouth daily.     benazepril (LOTENSIN) 20 MG tablet Take 20 mg by mouth daily.     cabozantinib (CABOMETYX) 20 MG tablet Take 1 tablet (20 mg total) by mouth daily. Take on an empty stomach, 1 hour before or 2 hours after meals. 30 tablet 2   celecoxib (CELEBREX) 100 MG capsule TAKE 1 CAPSULE BY MOUTH EVERY DAY 30 capsule 2   ferrous sulfate 325 (65 FE) MG EC tablet TAKE 1 TABLET BY MOUTH EVERY DAY WITH BREAKFAST 90 tablet 1   metoprolol tartrate (LOPRESSOR) 50 MG tablet Take 50 mg by mouth 2 (two) times daily.     mirtazapine (REMERON) 7.5 MG tablet TAKE 1 TABLET BY MOUTH EVERYDAY AT BEDTIME 90 tablet 1   Misc. Devices (WHEELCHAIR) MISC Light weight     OVER THE COUNTER MEDICATION Take 1 tablet by mouth daily as needed (for stomach discomfort). Allochol - (Herbal Supplement) Multivitamin with : Magnesium, potassium, garlic and charcoal. (Russian Medication)     pantoprazole (PROTONIX) 40 MG tablet Take 1 tablet (40 mg total) by mouth daily. 90 tablet 1   prochlorperazine (COMPAZINE) 10 MG tablet Take 1 tablet (10 mg total) by mouth every 6 (six) hours as needed for nausea or vomiting. 30 tablet 2   simvastatin (ZOCOR) 20 MG tablet Take 20 mg by mouth every evening.     UNABLE TO FIND Take 1 tablet by mouth daily as needed (chest pain (angina)). Med Name: Angeline Slim (  Menthyl isovalerate and menthol) (Russian Medication)     Valerian Root 100 MG CAPS Take 1 capsule by mouth daily as needed (for anxiety).     No current facility-administered medications for this visit.    VITAL SIGNS: There were no vitals taken for this visit. There were no vitals filed for this visit.   Estimated body mass index is 18.86 kg/m as calculated from the following:   Height as of 12/10/22: 5\' 1"  (1.549 m).   Weight as of 01/07/23: 99 lb 12.8 oz (45.3 kg).  PERFORMANCE STATUS (ECOG) : 2 - Symptomatic, <50% confined to bed  Physical  Exam General: NAD, sitting in wheelchair  Cardiovascular: RRR  Pulmonary:normal breathing pattern  Neurological: alert, oriented x 4, [place] interpreter present  IMPRESSION:    Neoplasm related pain Ms. Gail Fitzgerald reports worsening aches and pains. She takes Celebrex daily and expresses concern that maybe her body is "getting used to it." Discussed with patient that she may increase Celebrex to twice daily. She prefers not to do this because her desire is to take less pills rather than more. Verbalizes understanding that she may increase if/when desired.  She takes Tylenol a couple of times per day on an as-needed basis and reports this helps. She also tries to stay ahead of the pain by remaining active, walking outside and gardening.   2.   Dyspepsia  Patient knows to contact office with any symptom management needs.    PLAN: Tylenol 1000 mg twice daily as needed.  Celebrex 100 mg daily.  May take an additional dose daily for increased pain. Protonix 40 mg daily for dyspepsia.  Continue healthy food choices to promote gut comfort and motility. Palliative will plan to see patient back in 6-8 weeks in collaboration to other oncology appointments. Aware to contact office sooner with any symptom management needs.   Patient expressed understanding and was in agreement with this plan. She also understands that she can call the clinic at any time with any questions, concerns, or complaints.      Visit consisted of counseling and education dealing with the complex and emotionally intense issues of symptom management and palliative care in the setting of serious and potentially life-threatening illness.Greater than 50%  of this time was spent counseling and coordinating care related to the above assessment and plan.  Gail Fitzgerald, AGPCNP-BC  Palliative Medicine Team/[facility]  *Please note that this is a verbal dictation therefore any spelling or grammatical  errors are due to the "[employer]agon [employer] One" system interpretation.

## 2023-02-04 ENCOUNTER — Inpatient Hospital Stay: Payer: Medicaid Other | Attending: Internal Medicine

## 2023-02-04 ENCOUNTER — Inpatient Hospital Stay: Payer: Medicaid Other

## 2023-02-04 ENCOUNTER — Inpatient Hospital Stay: Payer: Medicaid Other | Admitting: Nurse Practitioner

## 2023-02-04 ENCOUNTER — Inpatient Hospital Stay: Payer: Medicaid Other | Admitting: Physician Assistant

## 2023-02-04 VITALS — BP 150/66 | HR 66 | Temp 98.2°F | Resp 16 | Wt 95.9 lb

## 2023-02-04 DIAGNOSIS — C642 Malignant neoplasm of left kidney, except renal pelvis: Secondary | ICD-10-CM | POA: Insufficient documentation

## 2023-02-04 DIAGNOSIS — C782 Secondary malignant neoplasm of pleura: Secondary | ICD-10-CM | POA: Insufficient documentation

## 2023-02-04 DIAGNOSIS — Z5112 Encounter for antineoplastic immunotherapy: Secondary | ICD-10-CM

## 2023-02-04 DIAGNOSIS — Z79620 Long term (current) use of immunosuppressive biologic: Secondary | ICD-10-CM | POA: Insufficient documentation

## 2023-02-04 LAB — CMP (CANCER CENTER ONLY)
ALT: 8 U/L (ref 0–44)
AST: 18 U/L (ref 15–41)
Albumin: 3.6 g/dL (ref 3.5–5.0)
Alkaline Phosphatase: 89 U/L (ref 38–126)
Anion gap: 10 (ref 5–15)
BUN: 17 mg/dL (ref 8–23)
CO2: 24 mmol/L (ref 22–32)
Calcium: 8.5 mg/dL — ABNORMAL LOW (ref 8.9–10.3)
Chloride: 95 mmol/L — ABNORMAL LOW (ref 98–111)
Creatinine: 0.57 mg/dL (ref 0.44–1.00)
GFR, Estimated: >60 mL/min (ref >60)
Glucose, Bld: 101 mg/dL — ABNORMAL HIGH (ref 70–99)
Potassium: 4.7 mmol/L (ref 3.5–5.1)
Sodium: 129 mmol/L — ABNORMAL LOW (ref 135–145)
Total Bilirubin: 0.3 mg/dL (ref 0.3–1.2)
Total Protein: 6.5 g/dL (ref 6.5–8.1)

## 2023-02-04 LAB — CBC WITH DIFFERENTIAL (CANCER CENTER ONLY)
Abs Immature Granulocytes: 0.02 10*3/uL (ref 0.00–0.07)
Basophils Absolute: 0 10*3/uL (ref 0.0–0.1)
Basophils Relative: 0 %
Eosinophils Absolute: 0.1 10*3/uL (ref 0.0–0.5)
Eosinophils Relative: 1 %
HCT: 35.9 % — ABNORMAL LOW (ref 36.0–46.0)
Hemoglobin: 11.8 g/dL — ABNORMAL LOW (ref 12.0–15.0)
Immature Granulocytes: 0 %
Lymphocytes Relative: 19 %
Lymphs Abs: 1.7 10*3/uL (ref 0.7–4.0)
MCH: 30.4 pg (ref 26.0–34.0)
MCHC: 32.9 g/dL (ref 30.0–36.0)
MCV: 92.5 fL (ref 80.0–100.0)
Monocytes Absolute: 1.2 10*3/uL — ABNORMAL HIGH (ref 0.1–1.0)
Monocytes Relative: 14 %
Neutro Abs: 5.8 10*3/uL (ref 1.7–7.7)
Neutrophils Relative %: 66 %
Platelet Count: 278 10*3/uL (ref 150–400)
RBC: 3.88 MIL/uL (ref 3.87–5.11)
RDW: 15 % (ref 11.5–15.5)
WBC Count: 8.8 10*3/uL (ref 4.0–10.5)
nRBC: 0 % (ref 0.0–0.2)

## 2023-02-04 LAB — TSH: TSH: 3.794 u[IU]/mL (ref 0.350–4.500)

## 2023-02-04 MED ORDER — SODIUM CHLORIDE 0.9 % IV SOLN
480.0000 mg | Freq: Once | INTRAVENOUS | Status: AC
  Administered 2023-02-04: 480 mg via INTRAVENOUS
  Filled 2023-02-04: qty 48

## 2023-02-04 MED ORDER — SODIUM CHLORIDE 0.9 % IV SOLN: Freq: Once | INTRAVENOUS | Status: AC

## 2023-02-04 MED ORDER — SODIUM CHLORIDE 0.9% FLUSH: 10.0000 mL | INTRAVENOUS | Status: DC | PRN

## 2023-02-04 MED ORDER — HEPARIN SOD (PORK) LOCK FLUSH 100 UNIT/ML IV SOLN: 500.0000 [IU] | Freq: Once | INTRAVENOUS | Status: DC | PRN

## 2023-02-04 NOTE — Patient Instructions (Addendum)
[facility]  Discharge Instructions: Thank you for choosing [facility] to provide your oncology and hematology care.   If you have a lab appointment with the Cancer Center, please go directly to the Cancer Center and check in at the registration area.   Wear comfortable clothing and clothing appropriate for easy access to any Portacath or PICC line.   We strive to give you quality time with your provider. You may need to reschedule your appointment if you arrive late (15 or more minutes).  Arriving late affects you and other patients whose appointments are after yours.  Also, if you miss three or more appointments without notifying the office, you may be dismissed from the clinic at the provider's discretion.      For prescription refill requests, have your pharmacy contact our office and allow 72 hours for refills to be completed.    Today you received the following chemotherapy and/or immunotherapy agents: Opdivo      To help prevent nausea and vomiting after your treatment, we encourage you to take your nausea medication as directed.  BELOW ARE SYMPTOMS THAT SHOULD BE REPORTED IMMEDIATELY: *FEVER GREATER THAN 100.4 F (38 C) OR HIGHER *CHILLS OR SWEATING *NAUSEA AND VOMITING THAT IS NOT CONTROLLED WITH YOUR NAUSEA MEDICATION *UNUSUAL SHORTNESS OF BREATH *UNUSUAL BRUISING OR BLEEDING *URINARY PROBLEMS (pain or burning when urinating, or frequent urination) *BOWEL PROBLEMS (unusual diarrhea, constipation, pain near the anus) TENDERNESS IN MOUTH AND THROAT WITH OR WITHOUT PRESENCE OF ULCERS (sore throat, sores in mouth, or a toothache) UNUSUAL RASH, SWELLING OR PAIN  UNUSUAL VAGINAL DISCHARGE OR ITCHING   Items with * indicate a potential emergency and should be followed up as soon as possible or go to the Emergency Department if any problems should occur.  Please show the CHEMOTHERAPY ALERT CARD or IMMUNOTHERAPY ALERT CARD at check-in  to the Emergency Department and triage nurse.  Should you have questions after your visit or need to cancel or reschedule your appointment, please contact [facility]  Dept: [phone]  and follow the prompts.  Office hours are 8:00 a.m. to 4:30 p.m. Monday - Friday. Please note that voicemails left after 4:00 p.m. may not be returned until the following business day.  We are closed weekends and major holidays. You have access to a nurse at all times for urgent questions. Please call the main number to the clinic Dept: [phone] and follow the prompts.   For any non-urgent questions, you may also contact your provider using MyChart. We now offer e-Visits for anyone 18 and older to request care online for non-urgent symptoms. For details visit [email or web address].   Also download the MyChart app! Go to the app store, search "MyChart", open the app, select [place], and log in with your MyChart username and password.   

## 2023-02-05 LAB — T4: T4, Total: 7.4 ug/dL (ref 4.5–12.0)

## 2023-02-13 ENCOUNTER — Other Ambulatory Visit: Payer: Self-pay

## 2023-02-14 ENCOUNTER — Other Ambulatory Visit (HOSPITAL_COMMUNITY): Payer: Self-pay

## 2023-02-15 ENCOUNTER — Other Ambulatory Visit (HOSPITAL_COMMUNITY): Payer: Self-pay

## 2023-02-17 ENCOUNTER — Other Ambulatory Visit: Payer: Self-pay

## 2023-02-26 ENCOUNTER — Other Ambulatory Visit: Payer: Self-pay

## 2023-02-26 ENCOUNTER — Other Ambulatory Visit (HOSPITAL_COMMUNITY): Payer: Self-pay

## 2023-02-27 ENCOUNTER — Other Ambulatory Visit (HOSPITAL_COMMUNITY): Payer: Self-pay

## 2023-02-27 ENCOUNTER — Other Ambulatory Visit: Payer: Self-pay

## 2023-02-28 NOTE — Progress Notes (Signed)
Palliative Medicine [facility]  Telephone:([phone] Fax:(336) [phone]   Name: Gail Fitzgerald Date: 02/28/2023 MRN: [id number]  DOB: Apr 07, 1935  Patient Care Team: [name], MD (Inactive) as PCP - General (Family Medicine)    REASON FOR CONSULTATION: Gail Fitzgerald is a 88 y.o. 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  Now actively receiving Opdivo and Cabometyx.  Palliative ask to see for symptom management and goals of care.    SOCIAL HISTORY:    [name] reports that she has never smoked. She has never used smokeless tobacco. She reports that she does not currently use alcohol. She reports that she does not currently use drugs.  ADVANCE DIRECTIVES:  Patient does not have advanced directives.  Does not wish to discuss.  CODE STATUS: Full code  PAST MEDICAL HISTORY: Past Medical History:  Diagnosis Date   Hypertension     PAST SURGICAL HISTORY:  Past Surgical History:  Procedure Laterality Date   IR THORACENTESIS ASP PLEURAL SPACE W/IMG GUIDE  04/05/2021   THORACENTESIS N/A 04/12/2021   Procedure: Alanson Puls;  Surgeon: Josephine Igo, DO;  Location: [facility];  Service: Pulmonary;  Laterality: N/A;    HEMATOLOGY/ONCOLOGY HISTORY:  Oncology History  Renal cell carcinoma, left (HCC)  05/09/2021 Initial Diagnosis   Renal cell carcinoma, left (HCC)   07/24/2021 -  Chemotherapy   Patient is on Treatment Plan : RENAL CELL Nivolumab (480) q28d       ALLERGIES:  has No Known Allergies.  MEDICATIONS:  Current Outpatient Medications  Medication Sig Dispense Refill   acetaminophen (TYLENOL) 325 MG tablet Take 650 mg by mouth every 6 (six) hours as needed for headache.     amLODipine (NORVASC) 10 MG tablet Take 10 mg by mouth  daily.     benazepril (LOTENSIN) 20 MG tablet Take 20 mg by mouth daily.     cabozantinib (CABOMETYX) 20 MG tablet Take 1 tablet (20 mg total) by mouth daily. Take on an empty stomach, 1 hour before or 2 hours after meals. 30 tablet 2   celecoxib (CELEBREX) 100 MG capsule TAKE 1 CAPSULE BY MOUTH EVERY DAY 30 capsule 2   ferrous sulfate 325 (65 FE) MG EC tablet TAKE 1 TABLET BY MOUTH EVERY DAY WITH BREAKFAST 90 tablet 1   metoprolol succinate (TOPROL-XL) 50 MG 24 hr tablet Take by mouth.     metoprolol tartrate (LOPRESSOR) 50 MG tablet Take 50 mg by mouth 2 (two) times daily.     mirtazapine (REMERON) 7.5 MG tablet TAKE 1 TABLET BY MOUTH EVERYDAY AT BEDTIME 90 tablet 1   Misc. Devices (WHEELCHAIR) MISC Light weight     OVER THE COUNTER MEDICATION Take 1 tablet by mouth daily as needed (for stomach discomfort). Allochol - (Herbal Supplement) Multivitamin with : Magnesium, potassium, garlic and charcoal. (Russian Medication)     pantoprazole (PROTONIX) 40 MG tablet Take 1 tablet (40 mg total) by mouth daily. 90 tablet 1   prochlorperazine (COMPAZINE) 10 MG tablet Take 1 tablet (10 mg total) by mouth every 6 (six) hours as needed for nausea or vomiting. 30 tablet 2   simvastatin (ZOCOR) 20 MG tablet Take 20 mg by mouth every evening.     UNABLE TO FIND Take 1 tablet by mouth daily as needed (chest pain (angina)). Med Name: Validol (Menthyl isovalerate and menthol) ([place]  Medication)     Valerian Root 100 MG CAPS Take 1 capsule by mouth daily as needed (for anxiety).     No current facility-administered medications for this visit.    VITAL SIGNS: There were no vitals taken for this visit. There were no vitals filed for this visit.   Estimated body mass index is 18.12 kg/m as calculated from the following:   Height as of 12/10/22: 5\' 1"  (1.549 m).   Weight as of 02/04/23: 95 lb 14.4 oz (43.5 kg).  PERFORMANCE STATUS (ECOG) : 2 - Symptomatic, <50% confined to bed  Physical Exam General: NAD,  sitting in wheelchair  Cardiovascular: RRR  Pulmonary:normal breathing pattern  Neurological: alert, oriented x 4, [place] interpreter present  IMPRESSION: Gail Fitzgerald presents to clinic today for follow-up. No acute distress. Interpretor present. Ms. Erskine Emery continues to be as active as possible. She is managing her home garden. States she is walking around her home due to high temperatures outside. Her family continues to be very supportive. Denies nausea, vomiting, constipation. Occasional diarrhea.   States she is having ongoing sinus congestion and drainage in the mornings. Mucous is clear. We discussed use of saline nasal spray as needed.    Neoplasm related pain Ms. Chavers reports occasional aches and pains. She sleeps on her bak or side due to this. Feels daily Celebrex is effective. She knows she may take twice daily if needed. She prefers not to do this because her desire is to take less pills rather than more.   She takes Tylenol a couple of times per day on an as-needed basis and reports this helps.   Patient knows to contact office with any symptom management needs.    PLAN: Tylenol 1000 mg twice daily as needed.  Celebrex 100 mg daily.  May take an additional dose daily for increased pain. Protonix 40 mg daily for dyspepsia.  Continue healthy food choices to promote gut comfort and motility. Palliative will plan to see patient back in 6-8 weeks in collaboration to other oncology appointments. Aware to contact office sooner with any symptom management needs.   Patient expressed understanding and was in agreement with this plan. She also understands that she can call the clinic at any time with any questions, concerns, or complaints.      Visit consisted of counseling and education dealing with the complex and emotionally intense issues of symptom management and palliative care in the setting of serious and potentially life-threatening illness.Greater than 50%  of this time  was spent counseling and coordinating care related to the above assessment and plan.  Willette Alma, AGPCNP-BC  Palliative Medicine Team/[facility]  *Please note that this is a verbal dictation therefore any spelling or grammatical errors are due to the "Dragon Medical One" system interpretation.

## 2023-03-03 ENCOUNTER — Other Ambulatory Visit (HOSPITAL_COMMUNITY): Payer: Self-pay

## 2023-03-03 ENCOUNTER — Other Ambulatory Visit: Payer: Self-pay

## 2023-03-04 ENCOUNTER — Inpatient Hospital Stay: Payer: Medicaid Other

## 2023-03-04 ENCOUNTER — Inpatient Hospital Stay (HOSPITAL_BASED_OUTPATIENT_CLINIC_OR_DEPARTMENT_OTHER): Payer: Medicaid Other | Admitting: Nurse Practitioner

## 2023-03-04 ENCOUNTER — Other Ambulatory Visit: Payer: Self-pay | Admitting: Nurse Practitioner

## 2023-03-04 ENCOUNTER — Inpatient Hospital Stay: Payer: Medicaid Other | Admitting: Internal Medicine

## 2023-03-04 ENCOUNTER — Other Ambulatory Visit: Payer: Self-pay

## 2023-03-04 ENCOUNTER — Inpatient Hospital Stay: Payer: Medicaid Other | Attending: Internal Medicine

## 2023-03-04 VITALS — BP 150/62 | HR 57 | Temp 97.3°F | Resp 17 | Ht 61.0 in | Wt 96.2 lb

## 2023-03-04 DIAGNOSIS — G893 Neoplasm related pain (acute) (chronic): Secondary | ICD-10-CM

## 2023-03-04 DIAGNOSIS — C642 Malignant neoplasm of left kidney, except renal pelvis: Secondary | ICD-10-CM | POA: Insufficient documentation

## 2023-03-04 DIAGNOSIS — Z5112 Encounter for antineoplastic immunotherapy: Secondary | ICD-10-CM | POA: Diagnosis present

## 2023-03-04 DIAGNOSIS — Z515 Encounter for palliative care: Secondary | ICD-10-CM

## 2023-03-04 DIAGNOSIS — R530 Neoplastic (malignant) related fatigue: Secondary | ICD-10-CM | POA: Diagnosis not present

## 2023-03-04 LAB — CMP (CANCER CENTER ONLY)
ALT: 8 U/L (ref 0–44)
AST: 18 U/L (ref 15–41)
Albumin: 3.4 g/dL — ABNORMAL LOW (ref 3.5–5.0)
Alkaline Phosphatase: 85 U/L (ref 38–126)
Anion gap: 9 (ref 5–15)
BUN: 15 mg/dL (ref 8–23)
CO2: 25 mmol/L (ref 22–32)
Calcium: 8.7 mg/dL — ABNORMAL LOW (ref 8.9–10.3)
Chloride: 92 mmol/L — ABNORMAL LOW (ref 98–111)
Creatinine: 0.55 mg/dL (ref 0.44–1.00)
GFR, Estimated: >60 mL/min (ref >60)
Glucose, Bld: 111 mg/dL — ABNORMAL HIGH (ref 70–99)
Potassium: 4.5 mmol/L (ref 3.5–5.1)
Sodium: 126 mmol/L — ABNORMAL LOW (ref 135–145)
Total Bilirubin: 0.3 mg/dL (ref 0.3–1.2)
Total Protein: 7 g/dL (ref 6.5–8.1)

## 2023-03-04 LAB — CBC WITH DIFFERENTIAL (CANCER CENTER ONLY)
Abs Immature Granulocytes: 0.02 10*3/uL (ref 0.00–0.07)
Basophils Absolute: 0 10*3/uL (ref 0.0–0.1)
Basophils Relative: 0 %
Eosinophils Absolute: 0 10*3/uL (ref 0.0–0.5)
Eosinophils Relative: 0 %
HCT: 35.7 % — ABNORMAL LOW (ref 36.0–46.0)
Hemoglobin: 11.7 g/dL — ABNORMAL LOW (ref 12.0–15.0)
Immature Granulocytes: 0 %
Lymphocytes Relative: 16 %
Lymphs Abs: 1.5 10*3/uL (ref 0.7–4.0)
MCH: 30.2 pg (ref 26.0–34.0)
MCHC: 32.8 g/dL (ref 30.0–36.0)
MCV: 92.2 fL (ref 80.0–100.0)
Monocytes Absolute: 1.3 10*3/uL — ABNORMAL HIGH (ref 0.1–1.0)
Monocytes Relative: 13 %
Neutro Abs: 6.5 10*3/uL (ref 1.7–7.7)
Neutrophils Relative %: 71 %
Platelet Count: 298 10*3/uL (ref 150–400)
RBC: 3.87 MIL/uL (ref 3.87–5.11)
RDW: 14.7 % (ref 11.5–15.5)
WBC Count: 9.4 10*3/uL (ref 4.0–10.5)
nRBC: 0 % (ref 0.0–0.2)

## 2023-03-04 MED ORDER — SODIUM CHLORIDE 0.9 % IV SOLN
480.0000 mg | Freq: Once | INTRAVENOUS | Status: AC
  Administered 2023-03-04: 480 mg via INTRAVENOUS
  Filled 2023-03-04: qty 48

## 2023-03-04 MED ORDER — SODIUM CHLORIDE 0.9 % IV SOLN: Freq: Once | INTRAVENOUS | Status: AC

## 2023-03-04 NOTE — Progress Notes (Signed)
All labs collected today WNL to receive treatment. Paper lab copy sent to pharmacy for review.

## 2023-03-04 NOTE — Progress Notes (Signed)
[facility] Telephone:(336) [phone]   Fax:(336) [phone]  OFFICE PROGRESS NOTE  [name], MD (Inactive) [address] [place] [address]  DIAGNOSIS: Metastatic renal cell carcinoma presented with large left renal mass in addition to significant left hemothorax pleural-based metastasis as well as left hilar and mediastinal lymphadenopathy diagnosed in August 2022.  Biomarker Findings Microsatellite status - MS-Stable Tumor Mutational Burden - 2 Muts/Mb Genomic Findings For a complete list of the genes assayed, please refer to the Appendix. MTAP loss CDKN2A/B CDKN2A loss 8 Disease relevant genes with no reportable Alteration  PD-L1 expression 0%  PRIOR THERAPY: Systemic immunotherapy with ipilimumab 1 mg/KG in addition to nivolumab 3 mg/KG every 3 weeks for 4 cycles followed by maintenance treatment with nivolumab.  Status post 3 cycles.  First dose started on 05/22/2021.  This treatment was discontinued secondary to disease progression.   CURRENT THERAPY: Opdivo 480 Mg IV every 4 weeks with Cabometyx 40 mg p.o. daily.  First dose August 21, 2021.  Status post 21 month of treatment.  Her dose of Cabometyx will be reduced to 20 mg p.o. daily starting 01/08/2022 secondary to persistent diarrhea.  INTERVAL HISTORY: Gail Fitzgerald 88 y.o. female returns to the clinic today for follow-up visit accompanied by her interpreter Inna.  The patient is feeling fine today with no concerning complaints except for the pain on the left side of the chest with radiation to the back and worse sometimes with deep breaths.  She has no shortness of breath, cough or hemoptysis.  She has no nausea, vomiting but occasional diarrhea with no constipation or abdominal pain.  She denied having any significant weight loss or night sweats.  She exercises at regular basis.  She is here today for evaluation before starting cycle #22 of her treatment.  She voiced her interest  about taking a break of the treatment after completion of 2 years of the treatment with immunotherapy to travel to [place] to see her family and some other affairs there.    MEDICAL HISTORY: Past Medical History:  Diagnosis Date   Hypertension     ALLERGIES:  has No Known Allergies.  MEDICATIONS:  Current Outpatient Medications  Medication Sig Dispense Refill   acetaminophen (TYLENOL) 325 MG tablet Take 650 mg by mouth every 6 (six) hours as needed for headache.     amLODipine (NORVASC) 10 MG tablet Take 10 mg by mouth daily.     benazepril (LOTENSIN) 20 MG tablet Take 20 mg by mouth daily.     cabozantinib (CABOMETYX) 20 MG tablet Take 1 tablet (20 mg total) by mouth daily. Take on an empty stomach, 1 hour before or 2 hours after meals. 30 tablet 2   celecoxib (CELEBREX) 100 MG capsule TAKE 1 CAPSULE BY MOUTH EVERY DAY 30 capsule 2   ferrous sulfate 325 (65 FE) MG EC tablet TAKE 1 TABLET BY MOUTH EVERY DAY WITH BREAKFAST 90 tablet 1   metoprolol succinate (TOPROL-XL) 50 MG 24 hr tablet Take by mouth.     metoprolol tartrate (LOPRESSOR) 50 MG tablet Take 50 mg by mouth 2 (two) times daily.     mirtazapine (REMERON) 7.5 MG tablet TAKE 1 TABLET BY MOUTH EVERYDAY AT BEDTIME 90 tablet 1   Misc. Devices (WHEELCHAIR) MISC Light weight     OVER THE COUNTER MEDICATION Take 1 tablet by mouth daily as needed (for stomach discomfort). Allochol - (Herbal Supplement) Multivitamin with : Magnesium, potassium, garlic and charcoal. (  [place] Medication)     pantoprazole (PROTONIX) 40 MG tablet Take 1 tablet (40 mg total) by mouth daily. 90 tablet 1   prochlorperazine (COMPAZINE) 10 MG tablet Take 1 tablet (10 mg total) by mouth every 6 (six) hours as needed for nausea or vomiting. 30 tablet 2   simvastatin (ZOCOR) 20 MG tablet Take 20 mg by mouth every evening.     UNABLE TO FIND Take 1 tablet by mouth daily as needed (chest pain (angina)). Med Name: Validol (Menthyl isovalerate and menthol) (Russian  Medication)     Valerian Root 100 MG CAPS Take 1 capsule by mouth daily as needed (for anxiety).     No current facility-administered medications for this visit.    SURGICAL HISTORY:  Past Surgical History:  Procedure Laterality Date   IR THORACENTESIS ASP PLEURAL SPACE W/IMG GUIDE  04/05/2021   THORACENTESIS N/A 04/12/2021   Procedure: Alanson Puls;  Surgeon: Josephine Igo, DO;  Location: [facility];  Service: Pulmonary;  Laterality: N/A;    REVIEW OF SYSTEMS:  Constitutional: positive for fatigue Eyes: negative Ears, nose, mouth, throat, and face: negative Respiratory: positive for pleurisy/chest pain Cardiovascular: negative Gastrointestinal: positive for diarrhea Genitourinary:negative Integument/breast: negative Hematologic/lymphatic: negative Musculoskeletal:positive for muscle weakness Neurological: negative Behavioral/Psych: negative Endocrine: negative Allergic/Immunologic: negative   PHYSICAL EXAMINATION: General appearance: alert, cooperative, fatigued, and no distress Head: Normocephalic, without obvious abnormality, atraumatic Neck: no adenopathy, no JVD, supple, symmetrical, trachea midline, and thyroid not enlarged, symmetric, no tenderness/mass/nodules Lymph nodes: Cervical, supraclavicular, and axillary nodes normal. Resp: clear to auscultation bilaterally Back: symmetric, no curvature. ROM normal. No CVA tenderness. Cardio: regular rate and rhythm, S1, S2 normal, no murmur, click, rub or gallop GI: soft, non-tender; bowel sounds normal; no masses,  no organomegaly Extremities: extremities normal, atraumatic, no cyanosis or edema  ECOG PERFORMANCE STATUS: 1 - Symptomatic but completely ambulatory  Blood pressure (!) 150/62, pulse (!) 57, temperature (!) 97.3 F (36.3 C), temperature source Temporal, resp. rate 17, height 5\' 1"  (1.549 m), weight 96 lb 3.2 oz (43.6 kg), SpO2 100 %.  LABORATORY DATA: Lab Results  Component Value Date   WBC 8.8  02/04/2023   HGB 11.8 (L) 02/04/2023   HCT 35.9 (L) 02/04/2023   MCV 92.5 02/04/2023   PLT 278 02/04/2023      Chemistry      Component Value Date/Time   NA 129 (L) 02/04/2023 1401   K 4.7 02/04/2023 1401   CL 95 (L) 02/04/2023 1401   CO2 24 02/04/2023 1401   BUN 17 02/04/2023 1401   CREATININE 0.57 02/04/2023 1401      Component Value Date/Time   CALCIUM 8.5 (L) 02/04/2023 1401   ALKPHOS 89 02/04/2023 1401   AST 18 02/04/2023 1401   ALT 8 02/04/2023 1401   BILITOT 0.3 02/04/2023 1401       RADIOGRAPHIC STUDIES: No results found.  ASSESSMENT AND PLAN: This is a 88 years old white female who originally from [place] with metastatic renal cell carcinoma presented with large left renal mass in addition to left hemithorax pleural-based metastasis as well as left hilar and mediastinal lymphadenopathy diagnosed in 2022.  Her molecular studies showed no actionable mutations and PD-L1 expression was negative. The patient had MRI of the brain performed recently that showed no evidence of metastatic disease to the brain. She started systemic treatment with immunotherapy with ipilimumab 1 mg/KG as well as nivolumab 3 mg/KG every 3 weeks status post 4 cycles.  This treatment was discontinued secondary to  disease progression. She is currently on treatment with a combination of nivolumab 480 Mg IV every 4 weeks in addition to Cabometyx 40 mg p.o. daily status post 21 cycles. Her dose of Cabometyx was reduced to 20 mg p.o. daily starting cycle #6 secondary to drug-induced diarrhea. The patient has been tolerating this treatment well with no concerning adverse effects except for fatigue and occasional diarrhea. I recommended for her to proceed with cycle #22 today as planned. She will come back for follow-up visit in 4 weeks for evaluation before the next cycle of her treatment. For the anemia, she will continue on the oral iron tablets. For the acid reflux she is currently on Protonix and she  was also advised to use probiotic for the bloating. I had a lengthy discussion with the patient today about her current condition and she is very interested in taking a break off treatment after completing 2 cycles of the immunotherapy which is expected to be in August 2024. I agreed with her request but this will be dependent on the imaging studies in August and if she has no evidence for disease progression, she may be able to take a break for few months to travel to [place].  I will discuss this with her in more details after the imaging studies. The patient was advised to call immediately if she has any other concerning symptoms in the interval.  The patient voices understanding of current disease status and treatment options and is in agreement with the current care plan. The total time spent in the appointment was 30 minutes.  All questions were answered. The patient knows to call the clinic with any problems, questions or concerns. We can certainly see the patient much sooner if necessary.   Disclaimer: This note was dictated with voice recognition software. Similar sounding words can inadvertently be transcribed and may not be corrected upon review.

## 2023-03-04 NOTE — Patient Instructions (Signed)
[facility]  Discharge Instructions: Thank you for choosing [facility] to provide your oncology and hematology care.   If you have a lab appointment with the Cancer Center, please go directly to the Cancer Center and check in at the registration area.   Wear comfortable clothing and clothing appropriate for easy access to any Portacath or PICC line.   We strive to give you quality time with your provider. You may need to reschedule your appointment if you arrive late (15 or more minutes).  Arriving late affects you and other patients whose appointments are after yours.  Also, if you miss three or more appointments without notifying the office, you may be dismissed from the clinic at the provider's discretion.      For prescription refill requests, have your pharmacy contact our office and allow 72 hours for refills to be completed.    Today you received the following chemotherapy and/or immunotherapy agents: Opdivo      To help prevent nausea and vomiting after your treatment, we encourage you to take your nausea medication as directed.  BELOW ARE SYMPTOMS THAT SHOULD BE REPORTED IMMEDIATELY: *FEVER GREATER THAN 100.4 F (38 C) OR HIGHER *CHILLS OR SWEATING *NAUSEA AND VOMITING THAT IS NOT CONTROLLED WITH YOUR NAUSEA MEDICATION *UNUSUAL SHORTNESS OF BREATH *UNUSUAL BRUISING OR BLEEDING *URINARY PROBLEMS (pain or burning when urinating, or frequent urination) *BOWEL PROBLEMS (unusual diarrhea, constipation, pain near the anus) TENDERNESS IN MOUTH AND THROAT WITH OR WITHOUT PRESENCE OF ULCERS (sore throat, sores in mouth, or a toothache) UNUSUAL RASH, SWELLING OR PAIN  UNUSUAL VAGINAL DISCHARGE OR ITCHING   Items with * indicate a potential emergency and should be followed up as soon as possible or go to the Emergency Department if any problems should occur.  Please show the CHEMOTHERAPY ALERT CARD or IMMUNOTHERAPY ALERT CARD at check-in  to the Emergency Department and triage nurse.  Should you have questions after your visit or need to cancel or reschedule your appointment, please contact [facility]  Dept: [phone]  and follow the prompts.  Office hours are 8:00 a.m. to 4:30 p.m. Monday - Friday. Please note that voicemails left after 4:00 p.m. may not be returned until the following business day.  We are closed weekends and major holidays. You have access to a nurse at all times for urgent questions. Please call the main number to the clinic Dept: [phone] and follow the prompts.   For any non-urgent questions, you may also contact your provider using MyChart. We now offer e-Visits for anyone 18 and older to request care online for non-urgent symptoms. For details visit [email or web address].   Also download the MyChart app! Go to the app store, search "MyChart", open the app, select [place], and log in with your MyChart username and password.   

## 2023-03-05 ENCOUNTER — Encounter: Payer: Self-pay | Admitting: Nurse Practitioner

## 2023-03-05 ENCOUNTER — Encounter: Payer: Self-pay | Admitting: Internal Medicine

## 2023-03-05 LAB — TSH: TSH: 2.366 u[IU]/mL (ref 0.350–4.500)

## 2023-03-06 LAB — T4: T4, Total: 7.3 ug/dL (ref 4.5–12.0)

## 2023-03-12 ENCOUNTER — Other Ambulatory Visit (HOSPITAL_COMMUNITY): Payer: Self-pay

## 2023-03-26 ENCOUNTER — Other Ambulatory Visit (HOSPITAL_COMMUNITY): Payer: Self-pay

## 2023-03-26 ENCOUNTER — Other Ambulatory Visit: Payer: Self-pay

## 2023-03-26 DIAGNOSIS — C3490 Malignant neoplasm of unspecified part of unspecified bronchus or lung: Secondary | ICD-10-CM

## 2023-03-26 MED ORDER — CABOMETYX 20 MG PO TABS
20.0000 mg | ORAL_TABLET | Freq: Every day | ORAL | 0 refills | Status: DC
Start: 2023-03-26 — End: 2023-04-14
  Filled 2023-03-26: qty 30, 30d supply, fill #0

## 2023-03-28 NOTE — Progress Notes (Unsigned)
[facility] OFFICE PROGRESS NOTE  [name], MD (Inactive) [address] [place] [address]  DIAGNOSIS: Metastatic renal cell carcinoma presented with large left renal mass in addition to significant left hemothorax pleural-based metastasis as well as left hilar and mediastinal lymphadenopathy diagnosed in August 2022.   Biomarker Findings Microsatellite status - MS-Stable Tumor Mutational Burden - 2 Muts/Mb Genomic Findings For a complete list of the genes assayed, please refer to the Appendix. MTAP loss CDKN2A/B CDKN2A loss 8 Disease relevant genes with no reportable Alteration   PD-L1 expression 0%    PRIOR THERAPY: Systemic immunotherapy with ipilimumab 1 mg/KG in addition to nivolumab 3 mg/KG every 3 weeks for 4 cycles followed by maintenance treatment with nivolumab. Status post 3 cycles. First dose started on 05/22/2021. This treatment was discontinued secondary to disease progression.   CURRENT THERAPY: Opdivo 480 Mg IV every 4 weeks with Cabometyx 40 mg p.o. daily.  First dose August 21, 2021.  Status post 22 cycles of treatment.  Her dose of Cabometyx will be reduced to 20 mg p.o. daily starting 01/08/2022 secondary to persistent diarrhea.   INTERVAL HISTORY: Gail Fitzgerald 88 y.o. female returns to the clinic today for a follow-up visit accompanied by her interpretor and daughter in law.  Patient has a few concerns today.  She has a sore on her lip which is painful and extends into the gums.  This started a few days ago.  She has been putting antibiotic ointment on it without any improvement.  She has never had any mouth sores before.  Denies any history of cold sores.   She also reports decreased appetite.  I previously prescribed her Remeron 7.5 mg p.o. nightly.  Confirmed again that she is not taking this at this time.  He has not lost any weight.  Her third concern is related to worsening left-sided chest and rib discomfort secondary  to her extensive pleural disease.  However she starting to have some pain on her right rib cage as well.  Denies any falls or injuries.  Denies any overlying skin changes.  The pain is random.  She is followed by palliative care and she is on Celebrex 100 milligram which she is taking once a day.  She is afraid to take Tylenol because she is concerned it is addictive.  She has been taking 500 to 1000 mg once a day.  She was told she can take 650 mg at night but is afraid.  She does not want to take any narcotics.  She also uses Voltaren cream occasionally.  She also mentions that she was previously active and walking but has not been as active recently.  Feels like she may be deconditioned.  She denies any shortness of breath at this time.  She mentions that at times, she may feel like she is not getting enough air in her lungs.  She notes that this is to be worse due to the humidity.  At her last appointment, Dr. Arbutus Ped told her that he may consider her for a treatment break after 2 years of treatment if her scan is stable and she may visit [place].  I am arranging for restaging scan before her visit in August.  The patient is currently undergoing infusions once a month with nivolumab which she tolerates well.  The patient is also on oral treatment with cabometryx.  This is dose reduced to 20 mg p.o. daily due to diarrhea. This dose was reduced in  May 2023.  She may intermittently experience diarrhea but it is controlled with Imodium.  Denies any fever or chills.  She sometimes gets hot/sweats at night because she uses an Occupational hygienist.  Denies any unusual cough except in the morning if she has postnasal drainage and mucus she may have some phlegm in her throat.  Denies any nausea or vomiting.  Denies any rashes or skin changes besides the lip sore.  She is here today for evaluation repeat blood work before undergoing cycle #23.    MEDICAL HISTORY: Past Medical History:  Diagnosis Date    Hypertension     ALLERGIES:  has No Known Allergies.  MEDICATIONS:  Current Outpatient Medications  Medication Sig Dispense Refill   acetaminophen (TYLENOL) 325 MG tablet Take 650 mg by mouth every 6 (six) hours as needed for headache.     amLODipine (NORVASC) 10 MG tablet Take 10 mg by mouth daily.     benazepril (LOTENSIN) 20 MG tablet Take 20 mg by mouth daily.     cabozantinib (CABOMETYX) 20 MG tablet Take 1 tablet (20 mg total) by mouth daily. Take on an empty stomach, 1 hour before or 2 hours after meals. 30 tablet 0   celecoxib (CELEBREX) 100 MG capsule TAKE 1 CAPSULE BY MOUTH EVERY DAY 30 capsule 2   ferrous sulfate 325 (65 FE) MG EC tablet TAKE 1 TABLET BY MOUTH EVERY DAY WITH BREAKFAST 90 tablet 1   lidocaine (LIDODERM) 5 % Place 1 patch onto the skin daily. Remove & Discard patch within 12 hours or as directed by MD 10 patch 1   magic mouthwash SOLN Take 5 mLs by mouth 3 (three) times daily as needed for mouth pain. 240 mL 0   metoprolol succinate (TOPROL-XL) 50 MG 24 hr tablet Take by mouth.     metoprolol tartrate (LOPRESSOR) 50 MG tablet Take 50 mg by mouth 2 (two) times daily.     mirtazapine (REMERON) 7.5 MG tablet TAKE 1 TABLET BY MOUTH EVERYDAY AT BEDTIME 90 tablet 1   Misc. Devices (WHEELCHAIR) MISC Light weight     OVER THE COUNTER MEDICATION Take 1 tablet by mouth daily as needed (for stomach discomfort). Allochol - (Herbal Supplement) Multivitamin with : Magnesium, potassium, garlic and charcoal. (Russian Medication)     pantoprazole (PROTONIX) 40 MG tablet Take 1 tablet (40 mg total) by mouth daily. 90 tablet 1   prochlorperazine (COMPAZINE) 10 MG tablet Take 1 tablet (10 mg total) by mouth every 6 (six) hours as needed for nausea or vomiting. 30 tablet 2   simvastatin (ZOCOR) 20 MG tablet Take 20 mg by mouth every evening.     UNABLE TO FIND Take 1 tablet by mouth daily as needed (chest pain (angina)). Med Name: Validol (Menthyl isovalerate and menthol) (Russian  Medication)     Valerian Root 100 MG CAPS Take 1 capsule by mouth daily as needed (for anxiety).     No current facility-administered medications for this visit.    SURGICAL HISTORY:  Past Surgical History:  Procedure Laterality Date   IR THORACENTESIS ASP PLEURAL SPACE W/IMG GUIDE  04/05/2021   THORACENTESIS N/A 04/12/2021   Procedure: Alanson Puls;  Surgeon: Josephine Igo, DO;  Location: [facility];  Service: Pulmonary;  Laterality: N/A;    REVIEW OF SYSTEMS:   Review of Systems  Constitutional: Positive for fatigue and decreased appetite.  Negative for chills,  fever and unexpected weight change.  HENT: Positive for sore on her lip that extends into  her gums negative for nosebleeds, sore throat and trouble swallowing.   Eyes: Negative for eye problems and icterus.  Respiratory: Occasional cough in the AM due to post nasal drainage. Intermittent shortness of breath (none at this time). Negative for hemoptysis,  and wheezing.   Cardiovascular: Positive for occasional radiating pain around her left lower rib cage with certain deep breaths or certain positions.  Negative for leg swelling.   Gastrointestinal: Positive for intermittent diarrhea. Negative for abdominal pain, nausea and vomiting.  Genitourinary: Negative for bladder incontinence, difficulty urinating, dysuria, frequency and hematuria.   Musculoskeletal: Positive for left and right posterior rib pain. Negative for back pain, gait problem, neck pain and neck stiffness.  Skin: Negative for itching and rash.  Neurological: Negative for dizziness, extremity weakness, gait problem, headaches, light-headedness and seizures.  Hematological: Negative for adenopathy. Positive for easy bruising on her upper extremities.  Psychiatric/Behavioral: Negative for confusion, depression and sleep disturbance. The patient is not nervous/anxious.   PHYSICAL EXAMINATION:  Blood pressure 118/82, pulse 61, temperature (!) 97.5 F (36.4 C),  temperature source Temporal, resp. rate 13, weight 96 lb 8 oz (43.8 kg), SpO2 97%.  ECOG PERFORMANCE STATUS: 1-2  Physical Exam  Constitutional: Oriented to person, place, and time and cachectic appearing female and in no distress.  HENT:  Head: Normocephalic and atraumatic.  Mouth/Throat: Positive for large sore on lower lip. Oropharynx is clear and moist. No oropharyngeal exudate.  Eyes: Conjunctivae are normal. Right eye exhibits no discharge. Left eye exhibits no discharge. No scleral icterus.  Neck: Normal range of motion. Neck supple.  Cardiovascular: Normal rate, regular rhythm, normal heart sounds and intact distal pulses.   Pulmonary/Chest: Effort normal. Quiet breath sounds in left lung. No respiratory distress. No wheezes. No rales.  Abdominal: Soft. Bowel sounds are normal. Exhibits no distension and no mass. There is no tenderness.  Musculoskeletal: Normal range of motion. Exhibits no edema.  Lymphadenopathy:    No cervical adenopathy.  Neurological: Alert and oriented to person, place, and time. Exhibits muscle wasting.  examined in the wheelchair.  Skin: Skin is warm and dry. No rash noted. Not diaphoretic. No erythema. No pallor.  Psychiatric: Mood, memory and judgment normal.   LABORATORY DATA: Lab Results  Component Value Date   WBC 9.8 04/01/2023   HGB 11.6 (L) 04/01/2023   HCT 34.7 (L) 04/01/2023   MCV 91.1 04/01/2023   PLT 283 04/01/2023      Chemistry      Component Value Date/Time   NA 129 (L) 04/01/2023 0852   K 4.2 04/01/2023 0852   CL 94 (L) 04/01/2023 0852   CO2 25 04/01/2023 0852   BUN 10 04/01/2023 0852   CREATININE 0.51 04/01/2023 0852      Component Value Date/Time   CALCIUM 8.5 (L) 04/01/2023 0852   ALKPHOS 69 04/01/2023 0852   AST 14 (L) 04/01/2023 0852   ALT 7 04/01/2023 0852   BILITOT 0.4 04/01/2023 0852       RADIOGRAPHIC STUDIES:  No results found.   ASSESSMENT/PLAN:  This is a very pleasant 88 year old Caucasian female  originally from [place].  She has been diagnosed with metastatic renal cell carcinoma.  She presented with a large left renal mass in addition to left hemithorax pleural-based metastasis as well as left hilar mediastinal lymphadenopathy.  She was diagnosed in 2022.  Her molecular studies show no actionable mutations and her PD-L1 expression is negative.  The patient is currently undergoing treatment with immunotherapy with ipilimumab 1  mg/kg as well as nivolumab 3 mg/kg IV every 3 weeks status post 4 cycles treatment was discontinued due to disease progression.    The patient had disease progression after cycle #4 with left-sided pleural tumor with similar size of the left renal mass.  Dr. Arbutus Ped recommended adding Cabometyx 40 mg p.o. daily with her nivolumab IV every 4 weeks.  She started Cabometyx on 08/21/21.  She is status post 22 cycles.  Dr. Arbutus Ped reduce the dose to 20 mg p.o. daily at her appointment on 01/08/2022 due to intolerance with diarrhea.   I reviewed her symptoms with Dr. Arbutus Ped.  Unclear etiology of her mouth sore which could be related to her immunotherapy.  The patient denies any history of cold sores or sores in her mouth.  I will send a prescription for Magic mouthwash with lidocaine that she may mix with Aquaphor or Vaseline to apply on her lip.  He may use the Magic mouthwash alone for inside of her mouth and gums.  If no improvement in a few days, I may consider her for a trial of Valtrex.  I will arrange for restaging CT scan of her chest, abdomen, pelvis prior to her next appointment.  We will review this at her next appointment.  If her disease is stable, Dr. Arbutus Ped may consider her for a treatment break.  We will discuss this at her next appointment based on the scan results.  The patient is taking Celebrex 100 mg daily.  She is afraid to take Tylenol because she is worried is addictive.  I let the patient know that Tylenol is not addictive.  There is room to increase her  Tylenol.  Reviewed the max dose is 4000 mg although I would recommend the lowest amount to control her pain daily.  Encouraged her to take 650 mg at least in the morning and at night.  Reach out to palliative care who also informed that she can take Celebrex twice a day if needed.  We also talked about topical transfer her back such as Lidoderm patches and Salonpas patches.  Her insurance requires a prior authorization for the 5% Lidoderm patches so I recommended that she purchase these over-the-counter.  Guarding her decreased appetite, strongly encouraged the patient to resume her Remeron.  Discussed that this is likely not effective if she is taking this intermittently.  I also let them know there is room to increase her dose to 15 mg if the 7.5 mg is ineffective.  She is going to resume taking this.  I advised the patient to call sooner if she has any new or worsening symptoms such as increased pain, worsening mouth sores, or breathing changes.  Labs were reviewed. Recommend he proceed with cycle #23 today scheduled  We will see her back for a follow up visit in 4 weeks for evaluation and repeat blood work before undergoing cycle #24  The patient was advised to call immediately if she has any concerning symptoms in the interval. The patient voices understanding of current disease status and treatment options and is in agreement with the current care plan. All questions were answered. The patient knows to call the clinic with any problems, questions or concerns. We can certainly see the patient much sooner if necessary    Orders Placed This Encounter  Procedures   CT CHEST ABDOMEN PELVIS WO CONTRAST    Standing Status:   Future    Standing Expiration Date:   03/31/2024    Order Specific Question:  Preferred imaging location?    Answer:   [facility]    Order Specific Question:   If indicated for the ordered procedure, I authorize the administration of oral contrast media per  Radiology protocol    Answer:   Yes    Order Specific Question:   Does the patient have a contrast media/X-ray dye allergy?    Answer:   No     The total time spent in the appointment was 30-39 minutes  Loys Shugars L Quanta Robertshaw, PA-C 04/01/23

## 2023-04-01 ENCOUNTER — Inpatient Hospital Stay: Payer: Medicaid Other | Admitting: Nurse Practitioner

## 2023-04-01 ENCOUNTER — Inpatient Hospital Stay: Payer: Medicaid Other | Attending: Internal Medicine

## 2023-04-01 ENCOUNTER — Other Ambulatory Visit: Payer: Self-pay

## 2023-04-01 ENCOUNTER — Inpatient Hospital Stay: Payer: Medicaid Other | Admitting: Physician Assistant

## 2023-04-01 ENCOUNTER — Inpatient Hospital Stay: Payer: Medicaid Other

## 2023-04-01 VITALS — BP 118/82 | HR 61 | Temp 97.5°F | Resp 13 | Wt 96.5 lb

## 2023-04-01 DIAGNOSIS — Z5112 Encounter for antineoplastic immunotherapy: Secondary | ICD-10-CM

## 2023-04-01 DIAGNOSIS — C642 Malignant neoplasm of left kidney, except renal pelvis: Secondary | ICD-10-CM

## 2023-04-01 DIAGNOSIS — C782 Secondary malignant neoplasm of pleura: Secondary | ICD-10-CM | POA: Insufficient documentation

## 2023-04-01 DIAGNOSIS — K1379 Other lesions of oral mucosa: Secondary | ICD-10-CM

## 2023-04-01 DIAGNOSIS — Z79620 Long term (current) use of immunosuppressive biologic: Secondary | ICD-10-CM | POA: Insufficient documentation

## 2023-04-01 LAB — CMP (CANCER CENTER ONLY)
ALT: 7 U/L (ref 0–44)
AST: 14 U/L — ABNORMAL LOW (ref 15–41)
Albumin: 3.3 g/dL — ABNORMAL LOW (ref 3.5–5.0)
Alkaline Phosphatase: 69 U/L (ref 38–126)
Anion gap: 10 (ref 5–15)
BUN: 10 mg/dL (ref 8–23)
CO2: 25 mmol/L (ref 22–32)
Calcium: 8.5 mg/dL — ABNORMAL LOW (ref 8.9–10.3)
Chloride: 94 mmol/L — ABNORMAL LOW (ref 98–111)
Creatinine: 0.51 mg/dL (ref 0.44–1.00)
GFR, Estimated: >60 mL/min (ref >60)
Glucose, Bld: 117 mg/dL — ABNORMAL HIGH (ref 70–99)
Potassium: 4.2 mmol/L (ref 3.5–5.1)
Sodium: 129 mmol/L — ABNORMAL LOW (ref 135–145)
Total Bilirubin: 0.4 mg/dL (ref 0.3–1.2)
Total Protein: 6.1 g/dL — ABNORMAL LOW (ref 6.5–8.1)

## 2023-04-01 LAB — CBC WITH DIFFERENTIAL (CANCER CENTER ONLY)
Abs Immature Granulocytes: 0.03 10*3/uL (ref 0.00–0.07)
Basophils Absolute: 0.1 10*3/uL (ref 0.0–0.1)
Basophils Relative: 1 %
Eosinophils Absolute: 0 10*3/uL (ref 0.0–0.5)
Eosinophils Relative: 0 %
HCT: 34.7 % — ABNORMAL LOW (ref 36.0–46.0)
Hemoglobin: 11.6 g/dL — ABNORMAL LOW (ref 12.0–15.0)
Immature Granulocytes: 0 %
Lymphocytes Relative: 10 %
Lymphs Abs: 1 10*3/uL (ref 0.7–4.0)
MCH: 30.4 pg (ref 26.0–34.0)
MCHC: 33.4 g/dL (ref 30.0–36.0)
MCV: 91.1 fL (ref 80.0–100.0)
Monocytes Absolute: 1.3 10*3/uL — ABNORMAL HIGH (ref 0.1–1.0)
Monocytes Relative: 13 %
Neutro Abs: 7.3 10*3/uL (ref 1.7–7.7)
Neutrophils Relative %: 76 %
Platelet Count: 283 10*3/uL (ref 150–400)
RBC: 3.81 MIL/uL — ABNORMAL LOW (ref 3.87–5.11)
RDW: 14.4 % (ref 11.5–15.5)
WBC Count: 9.8 10*3/uL (ref 4.0–10.5)
nRBC: 0 % (ref 0.0–0.2)

## 2023-04-01 LAB — TSH: TSH: 2.951 u[IU]/mL (ref 0.350–4.500)

## 2023-04-01 MED ORDER — LIDOCAINE 5 % EX PTCH
1.0000 | MEDICATED_PATCH | CUTANEOUS | 1 refills | Status: DC
Start: 2023-04-01 — End: 2023-06-29

## 2023-04-01 MED ORDER — SODIUM CHLORIDE 0.9 % IV SOLN: Freq: Once | INTRAVENOUS | Status: AC

## 2023-04-01 MED ORDER — MAGIC MOUTHWASH: 5.0000 mL | Freq: Three times a day (TID) | ORAL | 0 refills | Status: DC | PRN

## 2023-04-01 MED ORDER — SODIUM CHLORIDE 0.9 % IV SOLN
480.0000 mg | Freq: Once | INTRAVENOUS | Status: AC
  Administered 2023-04-01: 480 mg via INTRAVENOUS
  Filled 2023-04-01: qty 48

## 2023-04-01 NOTE — Patient Instructions (Addendum)
[facility]  Discharge Instructions: Thank you for choosing [facility] to provide your oncology and hematology care.   If you have a lab appointment with the [facility], please go directly to the [facility] and check in at the registration area.   Wear comfortable clothing and clothing appropriate for easy access to any Portacath or PICC line.   We strive to give you quality time with your provider. You may need to reschedule your appointment if you arrive late (15 or more minutes).  Arriving late affects you and other patients whose appointments are after yours.  Also, if you miss three or more appointments without notifying the office, you may be dismissed from the clinic at the provider's discretion.      For prescription refill requests, have your pharmacy contact our office and allow 72 hours for refills to be completed.    Today you received the following chemotherapy and/or immunotherapy agents: Opdivo      To help prevent nausea and vomiting after your treatment, we encourage you to take your nausea medication as directed.  BELOW ARE SYMPTOMS THAT SHOULD BE REPORTED IMMEDIATELY: *FEVER GREATER THAN 100.4 F (38 C) OR HIGHER *CHILLS OR SWEATING *NAUSEA AND VOMITING THAT IS NOT CONTROLLED WITH YOUR NAUSEA MEDICATION *UNUSUAL SHORTNESS OF BREATH *UNUSUAL BRUISING OR BLEEDING *URINARY PROBLEMS (pain or burning when urinating, or frequent urination) *BOWEL PROBLEMS (unusual diarrhea, constipation, pain near the anus) TENDERNESS IN MOUTH AND THROAT WITH OR WITHOUT PRESENCE OF ULCERS (sore throat, sores in mouth, or a toothache) UNUSUAL RASH, SWELLING OR PAIN  UNUSUAL VAGINAL DISCHARGE OR ITCHING   Items with * indicate a potential emergency and should be followed up as soon as possible or go to the Emergency Department if any problems should occur.  Please show the CHEMOTHERAPY ALERT CARD or IMMUNOTHERAPY ALERT CARD at check-in  to the Emergency Department and triage nurse.  Should you have questions after your visit or need to cancel or reschedule your appointment, please contact [facility]  Dept: [phone]  and follow the prompts.  Office hours are 8:00 a.m. to 4:30 p.m. Monday - Friday. Please note that voicemails left after 4:00 p.m. may not be returned until the following business day.  We are closed weekends and major holidays. You have access to a nurse at all times for urgent questions. Please call the main number to the clinic Dept: [phone] and follow the prompts.   For any non-urgent questions, you may also contact your provider using MyChart. We now offer e-Visits for anyone 45 and older to request care online for non-urgent symptoms. For details visit [email or web address].   Also download the MyChart app! Go to the app store, search "MyChart", open the app, select [facility], and log in with your MyChart username and password.

## 2023-04-02 ENCOUNTER — Telehealth: Payer: Self-pay

## 2023-04-02 LAB — T4: T4, Total: 7.3 ug/dL (ref 4.5–12.0)

## 2023-04-02 NOTE — Telephone Encounter (Signed)
Notified Patient of prior authorization approval for Lidocaine 5% Patches. Medication is approved through 04/01/2024.  No other needs or concerns voiced at this time. Pharmacy notified of prior authorization approval.

## 2023-04-07 ENCOUNTER — Other Ambulatory Visit: Payer: Self-pay | Admitting: Physician Assistant

## 2023-04-07 DIAGNOSIS — C642 Malignant neoplasm of left kidney, except renal pelvis: Secondary | ICD-10-CM

## 2023-04-07 MED ORDER — MIRTAZAPINE 15 MG PO TABS
15.0000 mg | ORAL_TABLET | Freq: Every day | ORAL | 2 refills | Status: DC
Start: 2023-04-07 — End: 2023-05-27

## 2023-04-14 ENCOUNTER — Other Ambulatory Visit: Payer: Self-pay | Admitting: Internal Medicine

## 2023-04-14 ENCOUNTER — Other Ambulatory Visit (HOSPITAL_COMMUNITY): Payer: Self-pay

## 2023-04-14 DIAGNOSIS — C3490 Malignant neoplasm of unspecified part of unspecified bronchus or lung: Secondary | ICD-10-CM

## 2023-04-14 MED ORDER — CABOMETYX 20 MG PO TABS
20.0000 mg | ORAL_TABLET | Freq: Every day | ORAL | 0 refills | Status: DC
Start: 2023-04-14 — End: 2023-05-13
  Filled 2023-04-15: qty 30, 30d supply, fill #0
  Filled ????-??-??: fill #0

## 2023-04-15 ENCOUNTER — Other Ambulatory Visit: Payer: Self-pay

## 2023-04-15 ENCOUNTER — Other Ambulatory Visit (HOSPITAL_COMMUNITY): Payer: Self-pay

## 2023-04-18 ENCOUNTER — Other Ambulatory Visit (HOSPITAL_COMMUNITY): Payer: Self-pay

## 2023-04-22 ENCOUNTER — Ambulatory Visit (HOSPITAL_COMMUNITY)
Admission: RE | Admit: 2023-04-22 | Discharge: 2023-04-22 | Disposition: A | Discharge status: Home / Self Care | Payer: Medicaid Other | Source: Ambulatory Visit | Attending: Physician Assistant | Admitting: Physician Assistant

## 2023-04-22 DIAGNOSIS — C642 Malignant neoplasm of left kidney, except renal pelvis: Secondary | ICD-10-CM | POA: Diagnosis present

## 2023-04-28 NOTE — Progress Notes (Unsigned)
Palliative Medicine [facility]  [phone] [phone]   Name: Gail Fitzgerald Date: 04/28/2023 MRN: [id number]  DOB: 02/11/1935  Patient Care Team: [name], MD (Inactive) as PCP - General (Family Medicine)    REASON FOR CONSULTATION: Gail Fitzgerald is a 88 y.o. 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  Now actively receiving Opdivo and Cabometyx.  Palliative ask to see for symptom management and goals of care.    SOCIAL HISTORY:    Gail Fitzgerald reports that she has never smoked. She has never used smokeless tobacco. She reports that she does not currently use alcohol. She reports that she does not currently use drugs.  ADVANCE DIRECTIVES:  Patient does not have advanced directives.  Does not wish to discuss.  CODE STATUS: Full code  PAST MEDICAL HISTORY: Past Medical History:  Diagnosis Date   Hypertension     PAST SURGICAL HISTORY:  Past Surgical History:  Procedure Laterality Date   IR THORACENTESIS ASP PLEURAL SPACE W/IMG GUIDE  04/05/2021   THORACENTESIS N/A 04/12/2021   Procedure: Alanson Puls;  Surgeon: Josephine Igo, DO;  Location: [facility];  Service: Pulmonary;  Laterality: N/A;    HEMATOLOGY/ONCOLOGY HISTORY:  Oncology History  Renal cell carcinoma, left (HCC)  05/09/2021 Initial Diagnosis   Renal cell carcinoma, left (HCC)   07/24/2021 -  Chemotherapy   Patient is on Treatment Plan : RENAL CELL Nivolumab ([phone] q28d       ALLERGIES:  has No Known Allergies.  MEDICATIONS:  Current Outpatient Medications  Medication Sig Dispense Refill   acetaminophen (TYLENOL) 325 MG tablet Take 650 mg by mouth every 6 (six) hours as needed for headache.     amLODipine (NORVASC) 10 MG tablet Take 10 mg by mouth  daily.     benazepril (LOTENSIN) 20 MG tablet Take 20 mg by mouth daily.     cabozantinib (CABOMETYX) 20 MG tablet Take 1 tablet (20 mg total) by mouth daily. Take on an empty stomach, 1 hour before or 2 hours after meals. 30 tablet 0   celecoxib (CELEBREX) 100 MG capsule TAKE 1 CAPSULE BY MOUTH EVERY DAY 30 capsule 2   ferrous sulfate 325 (65 FE) MG EC tablet TAKE 1 TABLET BY MOUTH EVERY DAY WITH BREAKFAST 90 tablet 1   lidocaine (LIDODERM) 5 % Place 1 patch onto the skin daily. Remove & Discard patch within 12 hours or as directed by MD 10 patch 1   magic mouthwash SOLN Take 5 mLs by mouth 3 (three) times daily as needed for mouth pain. 240 mL 0   metoprolol succinate (TOPROL-XL) 50 MG 24 hr tablet Take by mouth.     metoprolol tartrate (LOPRESSOR) 50 MG tablet Take 50 mg by mouth 2 (two) times daily.     mirtazapine (REMERON) 15 MG tablet Take 1 tablet (15 mg total) by mouth at bedtime. 30 tablet 2   Misc. Devices (WHEELCHAIR) MISC Light weight     OVER THE COUNTER MEDICATION Take 1 tablet by mouth daily as needed (for stomach discomfort). Allochol - (Herbal Supplement) Multivitamin with : Magnesium, potassium, garlic and charcoal. (Russian Medication)     pantoprazole (PROTONIX) 40 MG tablet Take 1 tablet (40 mg total) by mouth daily. 90 tablet 1   prochlorperazine (COMPAZINE) 10 MG tablet Take 1 tablet (10 mg total) by mouth every  6 (six) hours as needed for nausea or vomiting. 30 tablet 2   simvastatin (ZOCOR) 20 MG tablet Take 20 mg by mouth every evening.     UNABLE TO FIND Take 1 tablet by mouth daily as needed (chest pain (angina)). Med Name: Validol (Menthyl isovalerate and menthol) (Russian Medication)     Valerian Root 100 MG CAPS Take 1 capsule by mouth daily as needed (for anxiety).     No current facility-administered medications for this visit.    VITAL SIGNS: There were no vitals taken for this visit. There were no vitals filed for this visit.   Estimated body mass index is  18.23 kg/m as calculated from the following:   Height as of 03/04/23: 5\' 1"  (1.549 m).   Weight as of 04/01/23: 96 lb 8 oz (43.8 kg).  PERFORMANCE STATUS (ECOG) : 2 - Symptomatic, <50% confined to bed  Physical Exam General: NAD, sitting in wheelchair  Cardiovascular: RRR  Pulmonary:normal breathing pattern  Neurological: alert, oriented x 4, [place] interpreter present  IMPRESSION:    Neoplasm related pain Gail Fitzgerald reports occasional aches and pains. She sleeps on her bak or side due to this. Feels daily Celebrex is effective. She knows she may take twice daily if needed. She prefers not to do this because her desire is to take less pills rather than more.   She takes Tylenol a couple of times per day on an as-needed basis and reports this helps.   Patient knows to contact office with any symptom management needs.    PLAN: Tylenol 1000 mg twice daily as needed.  Celebrex 100 mg daily.  May take an additional dose daily for increased pain. Protonix 40 mg daily for dyspepsia.  Continue healthy food choices to promote gut comfort and motility. Palliative will plan to see patient back in 6-8 weeks in collaboration to other oncology appointments. Aware to contact office sooner with any symptom management needs.   Patient expressed understanding and was in agreement with this plan. She also understands that she can call the clinic at any time with any questions, concerns, or complaints.      Visit consisted of counseling and education dealing with the complex and emotionally intense issues of symptom management and palliative care in the setting of serious and potentially life-threatening illness.Greater than 50%  of this time was spent counseling and coordinating care related to the above assessment and plan.  Willette Alma, AGPCNP-BC  Palliative Medicine Team/[facility]  *Please note that this is a verbal dictation therefore any spelling or grammatical  errors are due to the "Dragon Medical One" system interpretation.

## 2023-04-29 ENCOUNTER — Inpatient Hospital Stay: Payer: Medicaid Other | Admitting: Internal Medicine

## 2023-04-29 ENCOUNTER — Inpatient Hospital Stay: Payer: Medicaid Other

## 2023-04-29 ENCOUNTER — Inpatient Hospital Stay: Payer: Medicaid Other | Attending: Internal Medicine

## 2023-04-29 ENCOUNTER — Inpatient Hospital Stay (HOSPITAL_BASED_OUTPATIENT_CLINIC_OR_DEPARTMENT_OTHER): Payer: Medicaid Other | Admitting: Nurse Practitioner

## 2023-04-29 ENCOUNTER — Encounter: Payer: Self-pay | Admitting: Nurse Practitioner

## 2023-04-29 VITALS — BP 126/59 | HR 68 | Resp 16

## 2023-04-29 DIAGNOSIS — Z515 Encounter for palliative care: Secondary | ICD-10-CM

## 2023-04-29 DIAGNOSIS — G893 Neoplasm related pain (acute) (chronic): Secondary | ICD-10-CM

## 2023-04-29 DIAGNOSIS — Z5112 Encounter for antineoplastic immunotherapy: Secondary | ICD-10-CM | POA: Diagnosis not present

## 2023-04-29 DIAGNOSIS — C642 Malignant neoplasm of left kidney, except renal pelvis: Secondary | ICD-10-CM

## 2023-04-29 DIAGNOSIS — Z79620 Long term (current) use of immunosuppressive biologic: Secondary | ICD-10-CM | POA: Diagnosis not present

## 2023-04-29 DIAGNOSIS — R530 Neoplastic (malignant) related fatigue: Secondary | ICD-10-CM | POA: Diagnosis not present

## 2023-04-29 LAB — CMP (CANCER CENTER ONLY)
ALT: 5 U/L (ref 0–44)
AST: 14 U/L — ABNORMAL LOW (ref 15–41)
Albumin: 3.2 g/dL — ABNORMAL LOW (ref 3.5–5.0)
Alkaline Phosphatase: 75 U/L (ref 38–126)
Anion gap: 10 (ref 5–15)
BUN: 10 mg/dL (ref 8–23)
CO2: 24 mmol/L (ref 22–32)
Calcium: 8.5 mg/dL — ABNORMAL LOW (ref 8.9–10.3)
Chloride: 95 mmol/L — ABNORMAL LOW (ref 98–111)
Creatinine: 0.5 mg/dL (ref 0.44–1.00)
GFR, Estimated: >60 mL/min (ref >60)
Glucose, Bld: 128 mg/dL — ABNORMAL HIGH (ref 70–99)
Potassium: 4.4 mmol/L (ref 3.5–5.1)
Sodium: 129 mmol/L — ABNORMAL LOW (ref 135–145)
Total Bilirubin: 0.4 mg/dL (ref 0.3–1.2)
Total Protein: 6.3 g/dL — ABNORMAL LOW (ref 6.5–8.1)

## 2023-04-29 LAB — CBC WITH DIFFERENTIAL (CANCER CENTER ONLY)
Abs Immature Granulocytes: 0.02 10*3/uL (ref 0.00–0.07)
Basophils Absolute: 0.1 10*3/uL (ref 0.0–0.1)
Basophils Relative: 1 %
Eosinophils Absolute: 0 10*3/uL (ref 0.0–0.5)
Eosinophils Relative: 0 %
HCT: 35.3 % — ABNORMAL LOW (ref 36.0–46.0)
Hemoglobin: 11.8 g/dL — ABNORMAL LOW (ref 12.0–15.0)
Immature Granulocytes: 0 %
Lymphocytes Relative: 11 %
Lymphs Abs: 0.9 10*3/uL (ref 0.7–4.0)
MCH: 30.6 pg (ref 26.0–34.0)
MCHC: 33.4 g/dL (ref 30.0–36.0)
MCV: 91.7 fL (ref 80.0–100.0)
Monocytes Absolute: 1.2 10*3/uL — ABNORMAL HIGH (ref 0.1–1.0)
Monocytes Relative: 14 %
Neutro Abs: 6.3 10*3/uL (ref 1.7–7.7)
Neutrophils Relative %: 74 %
Platelet Count: 302 10*3/uL (ref 150–400)
RBC: 3.85 MIL/uL — ABNORMAL LOW (ref 3.87–5.11)
RDW: 14.3 % (ref 11.5–15.5)
WBC Count: 8.6 10*3/uL (ref 4.0–10.5)
nRBC: 0 % (ref 0.0–0.2)

## 2023-04-29 LAB — TSH: TSH: 2.441 u[IU]/mL (ref 0.350–4.500)

## 2023-04-29 MED ORDER — SODIUM CHLORIDE 0.9 % IV SOLN
480.0000 mg | Freq: Once | INTRAVENOUS | Status: AC
  Administered 2023-04-29: 480 mg via INTRAVENOUS
  Filled 2023-04-29: qty 48

## 2023-04-29 MED ORDER — SODIUM CHLORIDE 0.9 % IV SOLN: Freq: Once | INTRAVENOUS | Status: AC

## 2023-04-29 NOTE — Patient Instructions (Signed)
Nivolumab Injection ??? ???????????? ????? ??? ?????????? ????????? ??????????? ??? ??????? ????????? ????? ??????????????? ????????. ?? ???????? ???????? ??????? ????????? ??? ?????????? ??????????????? ?????????? ??????. ??? ???????? ?????????????? ???????. ??? ????????? ????? ??????????? ? ?????? ?????; ???? ? ??? ????????? ???????, ?????????? ? ???????????? ????????? ??? ??????????. ???????????????? ????????? ???????? (????????): Opdivo ??? ??? ??????? ?????????? ???????????? ????????? ?? ?????? ?????? ????? ?????????? ?????????? ???????? ? ??????? ? ??? ????????? ?????????: ?????????? ?????????????? ????????? ?????? (????????? ????????? ?????? ??????? ????????); ???????????? ???????????, ????? ??? ??????? ?????, ??????????????? ???????? ?????, ????????; ????????? ??????? ??????? ??????? ? ???????; ???????????? ??????? ???????, ????? ??? ??????? ??????--????? ??? ????????? ??????; ?????????????? ???????; ????????? ??? ????????????? ??????? ?? ?????????, ?????? ?????????, ??????? ????????, ????????? ??? ???????????; ???????????? ??? ???????????? ????????????; ????????? ??????. ??? ??? ??????? ????????? ??? ?????????? ??? ????????? ????????????? ??? ????????????? ????????. ??? ????????? ???????? ? ???????? ??? ???????????. ????? ??????? ??????? ??? ????? ????????????? ??????????? ?????????? ? ????????? (MedGuide). ?????? ??? ??????? ??????????? ???????? ??? ??????????. ???????? ??????????? ?????????? ? ??????????? ?????????? ????? ????????? ?????. ???????? ?? ?? ??? ?? ???????????? ?????????? ??? ????? ????????? ????? ? 12 ???, ??????? ????????? ???? ????????????????. ?????????????: ???? ??? ???????, ??? ?? ????????? ?????????? ???? ????? ?????????, ?????????? ?????????? ? ????????????????? ????? ??? ???????? ????????? ??? ???????? ?????????? ??????. ??????????: ??? ????????? ????????????? ?????? ??? ???. ?? ???????? ?? ? ??????? ??????. ??? ?????????? ? ?????? ???????? ?????? ?????????? ??  ????????? ????????? ?? ????? ??? ???????? ??????????? ???. ????? ?? ?????????? ???????? ?????????. ???? ?? ?? ?????? ?????? ?? ????? ? ??????????? ?????, ???????? ?? ???? ??????????? ??????????. ? ??? ??? ????????? ????? ???????? ?? ??????????????? ?????????????? ?? ?????????. ???? ???????? ?? ???????? ???? ????????? ??????????????. ???????????? ???????????? ????????? ?????? ???? ??????????? ???? ????????, ????????????? ????????, ?????????????? ?????????? ? ??????? ???????. ????? ???????? ??? ? ???????, ???????????? ??????????? ???????? ??? ??????????. ????????? ???????? ????? ???????? ?? ?????????????? ? ??????????? ???? ??????????. ?? ??? ????? ???????? ???????? ??? ????????????? ????? ?????????? ?? ????? ??????? ???? ?????????? ?? ?????? ?????????? ??? ?????????? ???????????. ?? ????? ??????? ???? ??????????, ????????, ??? ??????????? ??????? ??????? ?????. ??? ????????? ????? ??????? ??????? ?????? ???????. ??? ????? ????????? ????? ????????? ?????? ??? ??????? ????? ?????? ?????? ?????????. ??? ????????? ??????????? ???? ??? ????????? ?????????????? ????????? ? ????????? ? ????? ??????????????? ?????????? ? ???????????? ?????????. ???? ????? ???? ???????? ??? ??????????? ?????. ????? ????????? ????? ?? ????? ????????? ????? ?????????? ??????? ??? ????????? ????????? ????. ???????? ????????? ??????? ???? ? ????????? ? ?????? ????, ??? ??? ??????????? ????????????? ????? ?? ??? ??? ? ??????????? ????????. ??????????????? ???????? ??????????? ?????????? ? ????? ????????? ??????. ??? ???????????? ??? ?????????????? ???????????? ?????????? ? ???????????? ?????????. ????? ??????? ??????? ???? ?????????? ??????? ??????? ???? ?? ????????????; ?? ?????? ???? ?????????????. ?? ????? ??????? ???? ?????????? ? ? ??????? 5 ??????? ????? ???????? ????????? ???? ????????????? ???????????? ???????? ?????? ????????????. ???????? ? ???????????? ??????????? ??????????? ??????? ????????????. ?? ????? ??????? ????  ?????????? ? ? ??????? 5 ??????? ????? ?????? ????????? ???? ?????? ??????? ??????? ??????. ????? ???????? ??????? ????? ???? ??? ?????? ????? ?????????? ???????? ???????, ? ??????? ??????? ??? ????? ?????? ???????? ??????????? ??????????: ????????????? ??????? -- ?????? ????, ???, ??????????, ???? ????, ???, ????? ??? ?????; ????? ??????, ?????? ??? ???????????? ???????; ???? ? ?????, ???????????, ??????????? ????? ??? ????????? ?? ????? ? ????????? ? ??????????? ?????? ??? ?????????? ??????; ?????????? ????????? ????? -- ????????? ???????? ??? ????????????, ??????, ???? ? ?????, ????????? ??? ???????????? ????????????, ??????????????, ????????? ???????, ???? ??? ??????. ????????? ??????? ??????????? ????? -- ???????? ????, ???????????????? ? ?????, ????????? ???????? ??? ????????????, ??????????????, ????????? ??? ???????????? ????????????, ?????????? ???????????????? ? ?????? ??? ????, ?????????? ?????????????, ??????, ????????? ?????, ?????????? ????? ??? ?????????? ?????????? ????, ?????? ??? ?????, ?????????????????; ??????? ?? ??????? -- ???? ? ?????, ?????? ??? ???????????? ???????, ?????????????? ????????? ??? ???????? ???????; ????????? ????? (???????????????) -- ?????????? ?????????? ?????????? ????, ???? ???????? ??? ?????-??????????? ?????, ???????? ???? ??? ???????? ? ????, ????????? ???????, ?????? ??? ????; ??????????? ?????? -- ???? ? ?????? ??????????, ?????? ????????, ???????, ?????????????? ????, ???? ?????-??????? ??? ??????????? ?????, ?????????? ???? ??? ?????? ????, ????????? ???????? ??? ????????????; ????, ??????????? ??? ???????? ? ?????? ??? ??????, ???????? ????????, ????????? ??????, ??????????? ???????? ??? ????????? ????, ????????? ?????????? ??? ???????????, ????????? ???????, ????????; ????, ????????? ??????????? ? ?????????? ????????????? ?????. ??????????? ????, ??????????? ???????, ????????? ??? ?????? ???????????? ? ????????? ???? ??? ????????? ???????? ??  ???. ????????? ??? ??????? ???? ? ??????, ?????? ? ??????, ????????? ???????????, ???????, ?????; ???????? ???????, ??????? ?????? ?? ??????? ???????????? ???????? (???????? ??????????? ??????????, ???? ??? ??????????? ??? ?????????? ??????????): ???? ? ??????, ???????? ??? ??????; ??????; ????????????; ?????? ????????; ???????; ?????? ????; ???? ???????? ?? ???????? ???? ????????? ???????? ????????. ?????????? ? ????? ?? ??????? ???????????? ???????? ????????. ?? ?????? ???????? ? ???????? ???????? ?  FDA ?? ???????? [id number]. ??? ??????? ??????? ??? ?????????? ??? ????????? ?????? ? ???????? ??? ???????????. ???????? ?? ????? ????????? ? ??? ????. <b>??????????: ???? ???????? ?? ???????? ???? ????????? ????????. ???? ? ??? ????????? ???????, ?????????? ??????? ?????????, ?????????? ? ?????, ?????????? ??? ??????? ???????????? ?????????.</b>  2024 Elsevier/Gold Standard (2022-07-17 00:00:00)

## 2023-04-29 NOTE — Progress Notes (Signed)
[facility] [phone]   Fax:(336) [phone]  OFFICE PROGRESS NOTE  [name], MD (Inactive) [address] [place] [address]  DIAGNOSIS: Metastatic renal cell carcinoma presented with large left renal mass in addition to significant left hemothorax pleural-based metastasis as well as left hilar and mediastinal lymphadenopathy diagnosed in August 2022.  Biomarker Findings Microsatellite status - MS-Stable Tumor Mutational Burden - 2 Muts/Mb Genomic Findings For a complete list of the genes assayed, please refer to the Appendix. MTAP loss CDKN2A/B CDKN2A loss 8 Disease relevant genes with no reportable Alteration  PD-L1 expression 0%  PRIOR THERAPY: Systemic immunotherapy with ipilimumab 1 mg/KG in addition to nivolumab 3 mg/KG every 3 weeks for 4 cycles followed by maintenance treatment with nivolumab.  Status post 3 cycles.  First dose started on 05/22/2021.  This treatment was discontinued secondary to disease progression.   CURRENT THERAPY: Opdivo 480 Mg IV every 4 weeks with Cabometyx 40 mg p.o. daily.  First dose August 21, 2021.  Status post 23  month of treatment.  Her dose of Cabometyx will be reduced to 20 mg p.o. daily starting 01/08/2022 secondary to persistent diarrhea.  INTERVAL HISTORY: Gail Fitzgerald 88 y.o. female returns to the clinic today for follow-up visit accompanied by her daughter-in-law and her interpreter [place].  The patient is feeling fine today with no concerning complaints except for the baseline shortness of breath increased with exertion that has been getting worse for months but the last 2 days she has been feeling better.  She also has intermittent abdominal pain and few episodes of diarrhea weekly.  She denied having any weight loss or night sweats.  She has no nausea, vomiting, abdominal pain or constipation.  She has intermittent headache improved with Tylenol.  She has been tolerating her  treatment with nivolumab and Cabometyx fairly well.  She is here today for evaluation with repeat CT scan of the chest, abdomen and pelvis for restaging of her disease.  MEDICAL HISTORY: Past Medical History:  Diagnosis Date   Hypertension     ALLERGIES:  has No Known Allergies.  MEDICATIONS:  Current Outpatient Medications  Medication Sig Dispense Refill   acetaminophen (TYLENOL) 325 MG tablet Take 650 mg by mouth every 6 (six) hours as needed for headache.     amLODipine (NORVASC) 10 MG tablet Take 10 mg by mouth daily.     benazepril (LOTENSIN) 20 MG tablet Take 20 mg by mouth daily.     cabozantinib (CABOMETYX) 20 MG tablet Take 1 tablet (20 mg total) by mouth daily. Take on an empty stomach, 1 hour before or 2 hours after meals. 30 tablet 0   celecoxib (CELEBREX) 100 MG capsule TAKE 1 CAPSULE BY MOUTH EVERY DAY 30 capsule 2   ferrous sulfate 325 (65 FE) MG EC tablet TAKE 1 TABLET BY MOUTH EVERY DAY WITH BREAKFAST 90 tablet 1   lidocaine (LIDODERM) 5 % Place 1 patch onto the skin daily. Remove & Discard patch within 12 hours or as directed by MD 10 patch 1   magic mouthwash SOLN Take 5 mLs by mouth 3 (three) times daily as needed for mouth pain. 240 mL 0   metoprolol succinate (TOPROL-XL) 50 MG 24 hr tablet Take by mouth.     metoprolol tartrate (LOPRESSOR) 50 MG tablet Take 50 mg by mouth 2 (two) times daily.     mirtazapine (REMERON) 15 MG tablet Take 1 tablet (15 mg total) by mouth  at bedtime. 30 tablet 2   Misc. Devices (WHEELCHAIR) MISC Light weight     OVER THE COUNTER MEDICATION Take 1 tablet by mouth daily as needed (for stomach discomfort). Allochol - (Herbal Supplement) Multivitamin with : Magnesium, potassium, garlic and charcoal. (Russian Medication)     pantoprazole (PROTONIX) 40 MG tablet Take 1 tablet (40 mg total) by mouth daily. 90 tablet 1   prochlorperazine (COMPAZINE) 10 MG tablet Take 1 tablet (10 mg total) by mouth every 6 (six) hours as needed for nausea or  vomiting. 30 tablet 2   simvastatin (ZOCOR) 20 MG tablet Take 20 mg by mouth every evening.     UNABLE TO FIND Take 1 tablet by mouth daily as needed (chest pain (angina)). Med Name: Validol (Menthyl isovalerate and menthol) (Russian Medication)     Valerian Root 100 MG CAPS Take 1 capsule by mouth daily as needed (for anxiety).     No current facility-administered medications for this visit.    SURGICAL HISTORY:  Past Surgical History:  Procedure Laterality Date   IR THORACENTESIS ASP PLEURAL SPACE W/IMG GUIDE  04/05/2021   THORACENTESIS N/A 04/12/2021   Procedure: Alanson Puls;  Surgeon: Josephine Igo, DO;  Location: [facility];  Service: Pulmonary;  Laterality: N/A;    REVIEW OF SYSTEMS:  Constitutional: positive for fatigue Eyes: negative Ears, nose, mouth, throat, and face: negative Respiratory: positive for dyspnea on exertion Cardiovascular: negative Gastrointestinal: positive for diarrhea Genitourinary:negative Integument/breast: negative Hematologic/lymphatic: negative Musculoskeletal:positive for muscle weakness Neurological: negative Behavioral/Psych: negative Endocrine: negative Allergic/Immunologic: negative   PHYSICAL EXAMINATION: General appearance: alert, cooperative, fatigued, and no distress Head: Normocephalic, without obvious abnormality, atraumatic Neck: no adenopathy, no JVD, supple, symmetrical, trachea midline, and thyroid not enlarged, symmetric, no tenderness/mass/nodules Lymph nodes: Cervical, supraclavicular, and axillary nodes normal. Resp: clear to auscultation bilaterally Back: symmetric, no curvature. ROM normal. No CVA tenderness. Cardio: regular rate and rhythm, S1, S2 normal, no murmur, click, rub or gallop GI: soft, non-tender; bowel sounds normal; no masses,  no organomegaly Extremities: extremities normal, atraumatic, no cyanosis or edema Neurologic: Alert and oriented X 3, normal strength and tone. Normal symmetric reflexes. Normal  coordination and gait  ECOG PERFORMANCE STATUS: 1 - Symptomatic but completely ambulatory  Blood pressure 116/65, pulse 62, temperature 98.2 F (36.8 C), temperature source Oral, resp. rate 15, height 5\' 1"  (1.549 m), weight 93 lb (42.2 kg), SpO2 96%.  LABORATORY DATA: Lab Results  Component Value Date   WBC 8.6 04/29/2023   HGB 11.8 (L) 04/29/2023   HCT 35.3 (L) 04/29/2023   MCV 91.7 04/29/2023   PLT 302 04/29/2023      Chemistry      Component Value Date/Time   NA 129 (L) 04/01/2023 0852   K 4.2 04/01/2023 0852   CL 94 (L) 04/01/2023 0852   CO2 25 04/01/2023 0852   BUN 10 04/01/2023 0852   CREATININE 0.51 04/01/2023 0852      Component Value Date/Time   CALCIUM 8.5 (L) 04/01/2023 0852   ALKPHOS 69 04/01/2023 0852   AST 14 (L) 04/01/2023 0852   ALT 7 04/01/2023 0852   BILITOT 0.4 04/01/2023 0852       RADIOGRAPHIC STUDIES: CT CHEST ABDOMEN PELVIS WO CONTRAST  Result Date: 04/24/2023 CLINICAL DATA:  Left renal cell carcinoma. Metastatic disease evaluation. * Tracking Code: BO * EXAM: CT CHEST, ABDOMEN AND PELVIS WITHOUT CONTRAST TECHNIQUE: Multidetector CT imaging of the chest, abdomen and pelvis was performed following the standard protocol without IV contrast. RADIATION  DOSE REDUCTION: This exam was performed according to the departmental dose-optimization program which includes automated exposure control, adjustment of the mA and/or kV according to patient size and/or use of iterative reconstruction technique. COMPARISON:  01/29/2023 FINDINGS: CT CHEST FINDINGS Cardiovascular: Mild ascending aortic dilatation at 4.1 cm is unchanged. Tortuous thoracic aorta. Normal heart size, without pericardial effusion. Three vessel coronary artery calcification. Mediastinum/Nodes: Calcified mediastinal nodes are likely related to old granulomatous disease. No mediastinal adenopathy. Hilar regions poorly evaluated without intravenous contrast. Lungs/Pleura: Small right pleural effusion is  minimally increased. Circumferential pleural-based tumor throughout the left hemithorax again identified. An index implant anteriorly measures 2.4 x 1.5 cm on 40/2 versus similar. Posterolaterally, just below the level of the carina, pleural thickening measures 2.2 cm on 26/12 versus 2.1 cm on the prior. The left lung is significantly compressed. No dominant parenchymal nodules identified. Irregular septal thickening including in the left lower and right middle lobes again identified, suspicious for lymphangitic tumor spread. Musculoskeletal: No acute osseous abnormality. CT ABDOMEN PELVIS FINDINGS Hepatobiliary: Normal noncontrast appearance of the liver. Gallstones up to 1.8 cm without acute cholecystitis or biliary duct dilatation. Pancreas: Normal, without mass or ductal dilatation. Spleen: Normal in size, without focal abnormality. Adrenals/Urinary Tract: Normal adrenal glands. Left renal cysts. Anterior lower pole exophytic heterogeneous renal mass including at 2.8 x 2.6 cm on 74/2, similar to on the prior (when remeasured). No hydronephrosis. No bladder calculi. Stomach/Bowel: Normal stomach, without wall thickening. Scattered colonic diverticula. Normal small bowel. Vascular/Lymphatic: Aortic atherosclerosis. No abdominopelvic adenopathy. Reproductive: Normal uterus and adnexa. Other: Moderate pelvic floor laxity.  No significant free fluid. Musculoskeletal: Osteopenia. Right iliac tiny sclerotic lesion is likely a bone island. Moderate L1 and mild L4 compression deformities are unchanged. IMPRESSION: 1. No change in the complex lower pole left renal mass. 2. Similar circumferential pleural-based tumor throughout the left hemithorax. Similar bilateral irregular septal thickening, likely lymphangitic tumor spread. 3. Increased small right pleural effusion. 4. Similar mild ascending aortic dilatation at 4.1 cm. Of doubtful clinical significance given comorbidities. Recommend attention on follow-up. 5.  Incidental findings, including: Cholelithiasis. Coronary artery atherosclerosis. Aortic Atherosclerosis (ICD10-I70.0). L1 and L4 chronic compression deformities. Electronically Signed   By: Jeronimo Greaves M.D.   On: 04/24/2023 10:14    ASSESSMENT AND PLAN: This is a 88 years old white female who originally from [place] with metastatic renal cell carcinoma presented with large left renal mass in addition to left hemithorax pleural-based metastasis as well as left hilar and mediastinal lymphadenopathy diagnosed in 2022.  Her molecular studies showed no actionable mutations and PD-L1 expression was negative. The patient had MRI of the brain performed recently that showed no evidence of metastatic disease to the brain. She started systemic treatment with immunotherapy with ipilimumab 1 mg/KG as well as nivolumab 3 mg/KG every 3 weeks status post 4 cycles.  This treatment was discontinued secondary to disease progression. She is currently on treatment with a combination of nivolumab 480 Mg IV every 4 weeks in addition to Cabometyx 40 mg p.o. daily status post 21 cycles. Her dose of Cabometyx was reduced to 20 mg p.o. daily starting cycle #6 secondary to drug-induced diarrhea. The patient has been tolerating her treatment well with no concerning adverse effect except for the few episodes of diarrhea every week. She had repeat CT scan of the chest, abdomen and pelvis performed recently.  I personally and independently reviewed the scan and discussed the result with the patient and her family. Her scan showed no concerning  findings for disease progression except for slight increase in the small right pleural effusion. I recommended for her to continue her current treatment with the same regimen. I will see her back for follow-up visit in 4 weeks for evaluation before starting cycle #25. For the anemia, she will continue on the oral iron tablets. For the acid reflux she is currently on Protonix and she was also  advised to use probiotic for the bloating. The patient was advised to call immediately if she has any other concerning symptoms in the interval. The patient voices understanding of current disease status and treatment options and is in agreement with the current care plan. The total time spent in the appointment was 30 minutes.  All questions were answered. The patient knows to call the clinic with any problems, questions or concerns. We can certainly see the patient much sooner if necessary.  The total time spent in the appointment was 30 minutes.  Disclaimer: This note was dictated with voice recognition software. Similar sounding words can inadvertently be transcribed and may not be corrected upon review.

## 2023-04-30 LAB — T4: T4, Total: 7 ug/dL (ref 4.5–12.0)

## 2023-05-13 ENCOUNTER — Other Ambulatory Visit (HOSPITAL_COMMUNITY): Payer: Self-pay

## 2023-05-13 ENCOUNTER — Other Ambulatory Visit: Payer: Self-pay | Admitting: Internal Medicine

## 2023-05-13 DIAGNOSIS — C3490 Malignant neoplasm of unspecified part of unspecified bronchus or lung: Secondary | ICD-10-CM

## 2023-05-13 MED ORDER — CABOMETYX 20 MG PO TABS
20.0000 mg | ORAL_TABLET | Freq: Every day | ORAL | 0 refills | Status: DC
Start: 2023-05-13 — End: 2023-06-16
  Filled 2023-05-14 (×2): qty 30, 30d supply, fill #0

## 2023-05-14 ENCOUNTER — Other Ambulatory Visit: Payer: Self-pay

## 2023-05-14 IMAGING — CR DG CHEST 2V
1 series · 1 of 1 positions shown · non-contrast
Comparison: None.

CLINICAL DATA: Chest pain, shortness of breath.

EXAM:
CHEST - 2 VIEW

[chest lat]
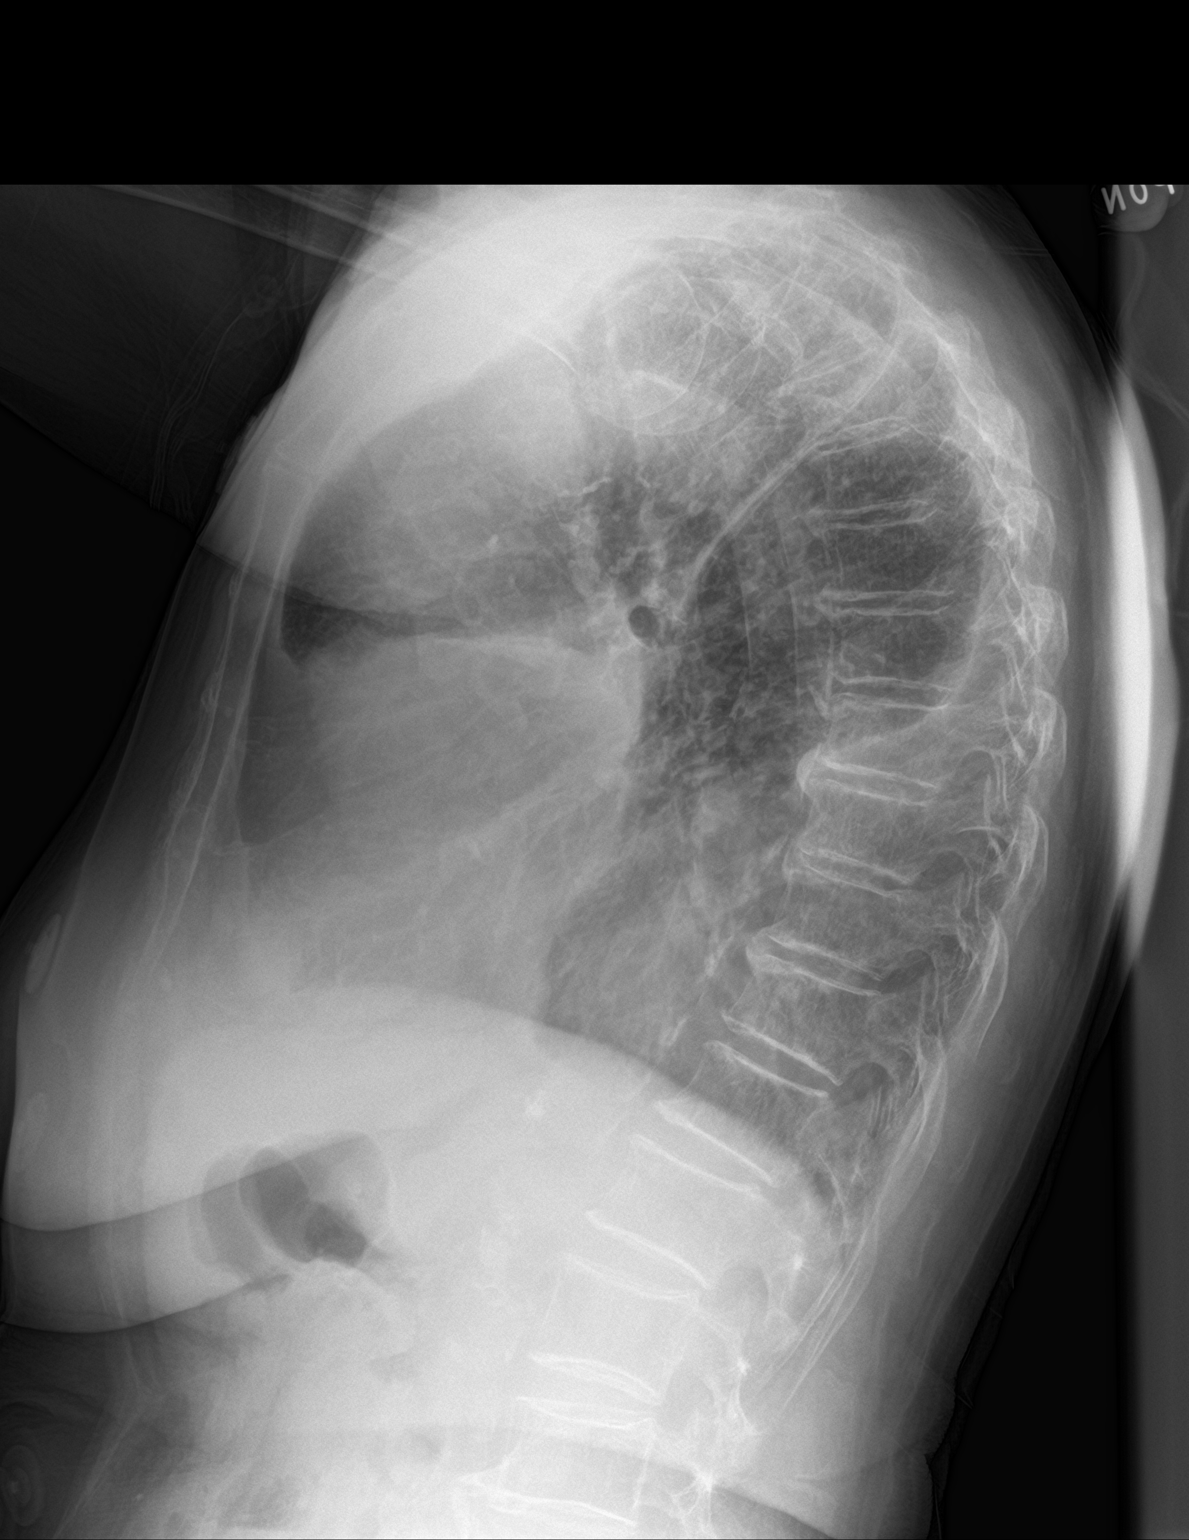

[1 of 1 positions shown; findings below may reference images not displayed]

FINDINGS: Stable cardiomegaly. No pneumothorax is noted. Right lung is clear.
Moderate left pleural effusion is noted with probable underlying
atelectasis or infiltrate. Bony thorax is unremarkable.
IMPRESSION: Moderate size left pleural effusion with associated left basilar
atelectasis or infiltrate.

Aortic Atherosclerosis ([id number].A).

## 2023-05-15 IMAGING — US US THORACENTESIS ASP PLEURAL SPACE W/IMG GUIDE
1 series · 3 of 3 positions shown · non-contrast
Comparison: none

INDICATION: Left pleural effusion

[Series 1: us thoracentesis asp pleural space w/img guide · 3 of 3 slices shown]
[im 1/3]
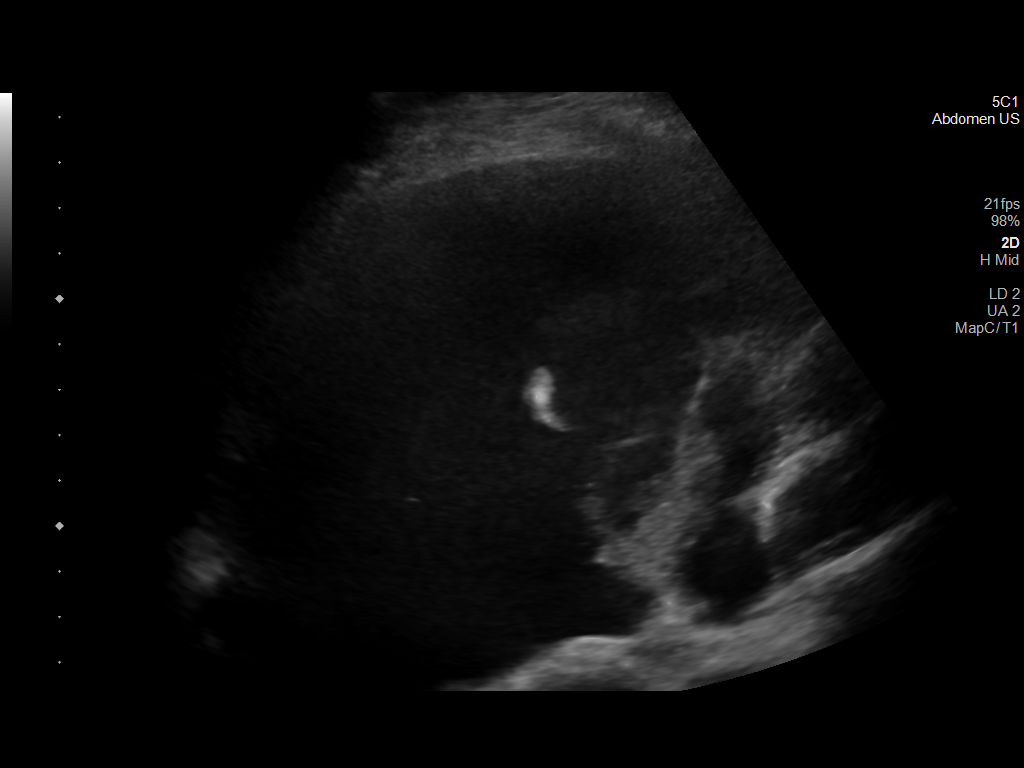
[im 2/3]
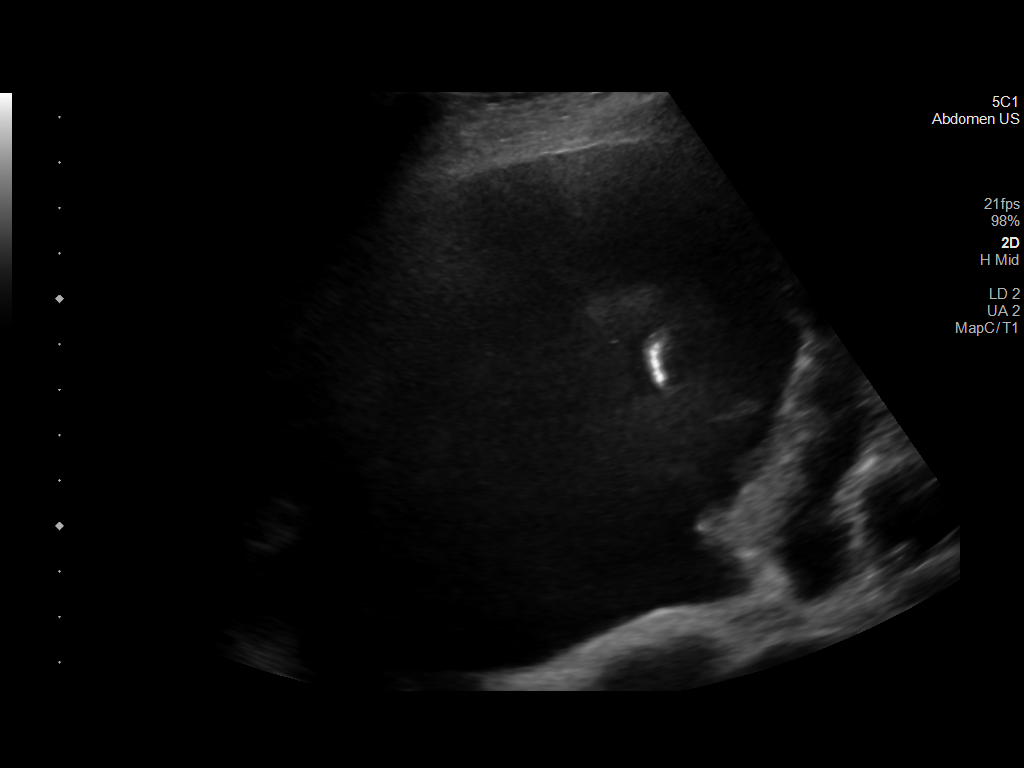
[im 3/3]
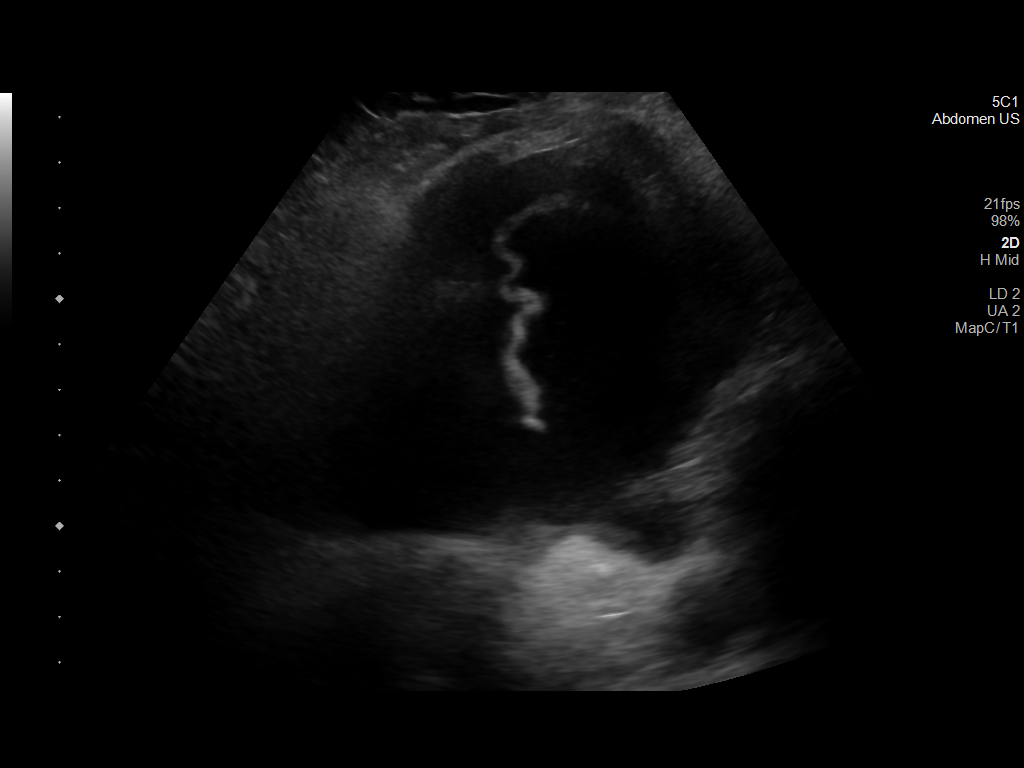

[3 of 3 positions shown; findings below may reference images not displayed]

Back pain; wt loss

EXAM:
ULTRASOUND GUIDED LEFT THORACENTESIS

MEDICATIONS:
10% 2% lidocaine.

COMPLICATIONS:
None immediate.

PROCEDURE:
An ultrasound guided thoracentesis was thoroughly discussed with the
patient and questions answered. The benefits, risks, alternatives
and complications were also discussed. The patient understands and
wishes to proceed with the procedure. Written consent was obtained.

Ultrasound was performed to localize and mark an adequate pocket of
fluid in the left chest. The area was then prepped and draped in the
normal sterile fashion. 1% Lidocaine was used for local anesthesia.
Under ultrasound guidance a Yueh catheter was introduced.
Thoracentesis was performed. The catheter was removed and a dressing
applied.
FINDINGS: A total of approximately 700 cc of dark bloody fluid was removed.
Samples were sent to the laboratory as requested by the clinical
team.
IMPRESSION: Successful ultrasound guided left thoracentesis yielding 700 cc of
pleural fluid.

CXR shows No PTX per Dr Lora

Read by

Clemen Jacks

## 2023-05-16 ENCOUNTER — Other Ambulatory Visit (HOSPITAL_COMMUNITY): Payer: Self-pay

## 2023-05-22 NOTE — Progress Notes (Signed)
[facility] OFFICE PROGRESS NOTE  [name], MD (Inactive) [address] [place] [address]  DIAGNOSIS: Metastatic renal cell carcinoma presented with large left renal mass in addition to significant left hemothorax pleural-based metastasis as well as left hilar and mediastinal lymphadenopathy diagnosed in August 2022.    Biomarker Findings Microsatellite status - MS-Stable Tumor Mutational Burden - 2 Muts/Mb Genomic Findings For a complete list of the genes assayed, please refer to the Appendix. MTAP loss CDKN2A/B CDKN2A loss 8 Disease relevant genes with no reportable Alteration   PD-L1 expression 0%  PRIOR THERAPY: Systemic immunotherapy with ipilimumab 1 mg/KG in addition to nivolumab 3 mg/KG every 3 weeks for 4 cycles followed by maintenance treatment with nivolumab. Status post 3 cycles. First dose started on 05/22/2021. This treatment was discontinued secondary to disease progression   CURRENT THERAPY: Opdivo 480 Mg IV every 4 weeks with Cabometyx 40 mg p.o. daily.  First dose August 21, 2021.  Status post 24 cycles of treatment.  Her dose of Cabometyx will be reduced to 20 mg p.o. daily starting 01/08/2022 secondary to persistent diarrhea.   INTERVAL HISTORY: Gail Fitzgerald 88 y.o. female returns to the clinic today for a follow-up visit accompanied by her interpretor and son.   She has a lot of ongoing concerns today. She reports fatigue, weakness, continued poor appetite and inability to gain weight, palpitations, lightheadedness, and ongoing dyspnea on exertion and rib discomfort.   She occasionally has left-sided chest/rib discomfort secondary to her extensive pleural metastatic disease for which she was seen by palliative care.  The patient does not like taking pain medication.  She is on Celebrex 100 mg which she takes once a day.  She is afraid to take Tylenol because she is concerned about if it will cause addiction.  She takes  500 to 1000 mg daily. She knows that she can take this more but she is afraid. Therefore, she may alternate with NSAIDs and tylenol. She also uses a topical spray on her back for pain. She is scheduled to see palliative care today. Her last CT scan from her last appointment shows stable disease. She also reports dyspnea on exertion. She used to be active but has not been as active and tires easier. She states her shortness of breath has been ongoing for month and is stable compared to her last CT scan last month.   The second ongoing issue is related to poor appetite. She is on 15 mg of remeron. She is not interested in megace due to the side effect profile. Marinol is on back order. She is a very picky eater and stubborn. Her son has been trying to have her drink protein supplemental drinks and eat small frequent meals and increase her protein/meat intake but the patient refuses.  Her weight is stable but she is still very thin at 93 lbs. She likes tea. She does salt water rinses and biotene every morning. No thrush on exam.   She mentions she thinks there is something wrong with her heart. When asked to elaborate, she mentions she sometimes has palpitations and lightheadedness. Her son states they check her BP and pulse and it is normal. Denies syncope or significant cardiac history. She has mild bilateral pitting edema on her ankles bilaterally.   She has some mucus production in the morning secondary to post nasal drainage. She uses mucinex which has helped.   They are wondering if she should get her COVID and  flu shots.   She denies any fever, chills, or night sweats.  Denies any unusual cough except in the morning secondary to postnasal drainage, as previously mentioned.   Denies any vomiting. She may have nausea if she has an empty stomach. She does not use her anti-emetic. Denies any rashes or skin changes.  She occasionally gets diarrhea secondary to her treatment with Cabometyx which she is on a  reduced dose of 20 mg and she has been tolerating this much better.  She uses Imodium if needed intermittently.  He is here today for evaluation and repeat blood work for undergoing cycle number #25.      MEDICAL HISTORY: Past Medical History:  Diagnosis Date   Hypertension     ALLERGIES:  has No Known Allergies.  MEDICATIONS:  Current Outpatient Medications  Medication Sig Dispense Refill   acetaminophen (TYLENOL) 325 MG tablet Take 650 mg by mouth every 6 (six) hours as needed for headache.     amLODipine (NORVASC) 10 MG tablet Take 10 mg by mouth daily.     benazepril (LOTENSIN) 20 MG tablet Take 20 mg by mouth daily.     cabozantinib (CABOMETYX) 20 MG tablet Take 1 tablet (20 mg total) by mouth daily. Take on an empty stomach, 1 hour before or 2 hours after meals. 30 tablet 0   celecoxib (CELEBREX) 100 MG capsule TAKE 1 CAPSULE BY MOUTH EVERY DAY 30 capsule 2   ferrous sulfate 325 (65 FE) MG EC tablet TAKE 1 TABLET BY MOUTH EVERY DAY WITH BREAKFAST 90 tablet 1   lidocaine (LIDODERM) 5 % Place 1 patch onto the skin daily. Remove & Discard patch within 12 hours or as directed by MD 10 patch 1   magic mouthwash SOLN Take 5 mLs by mouth 3 (three) times daily as needed for mouth pain. 240 mL 0   metoprolol succinate (TOPROL-XL) 50 MG 24 hr tablet Take by mouth.     metoprolol tartrate (LOPRESSOR) 50 MG tablet Take 50 mg by mouth 2 (two) times daily.     mirtazapine (REMERON) 15 MG tablet Take 1 tablet (15 mg total) by mouth at bedtime. 30 tablet 2   Misc. Devices (WHEELCHAIR) MISC Light weight     OVER THE COUNTER MEDICATION Take 1 tablet by mouth daily as needed (for stomach discomfort). Allochol - (Herbal Supplement) Multivitamin with : Magnesium, potassium, garlic and charcoal. (Russian Medication)     pantoprazole (PROTONIX) 40 MG tablet Take 1 tablet (40 mg total) by mouth daily. 90 tablet 1   prochlorperazine (COMPAZINE) 10 MG tablet Take 1 tablet (10 mg total) by mouth every 6  (six) hours as needed for nausea or vomiting. 30 tablet 2   simvastatin (ZOCOR) 20 MG tablet Take 20 mg by mouth every evening.     UNABLE TO FIND Take 1 tablet by mouth daily as needed (chest pain (angina)). Med Name: Validol (Menthyl isovalerate and menthol) (Russian Medication)     Valerian Root 100 MG CAPS Take 1 capsule by mouth daily as needed (for anxiety).     No current facility-administered medications for this visit.    SURGICAL HISTORY:  Past Surgical History:  Procedure Laterality Date   IR THORACENTESIS ASP PLEURAL SPACE W/IMG GUIDE  04/05/2021   THORACENTESIS N/A 04/12/2021   Procedure: Alanson Puls;  Surgeon: Josephine Igo, DO;  Location: [facility];  Service: Pulmonary;  Laterality: N/A;    REVIEW OF SYSTEMS:   Constitutional: Positive for fatigue and decreased appetite.  Negative  for chills,  fever and unexpected weight change.  HENT: Negative for nosebleeds, sore throat and trouble swallowing.   Eyes: Negative for eye problems and icterus.  Respiratory: Occasional cough in the AM due to post nasal drainage. Intermittent shortness of breath with exertion. Negative for hemoptysis and wheezing.   Cardiovascular: Positive for occasional radiating pain around her left lower rib cage with certain deep breaths or certain positions.  Mild ankle pitting edema. Occasional palpitations.   Gastrointestinal: Positive for mild intermittent diarrhea. Negative for abdominal pain, constipation and vomiting.  Genitourinary: Negative for bladder incontinence, difficulty urinating, dysuria, frequency and hematuria.   Musculoskeletal: Positive for left and right posterior rib pain. Negative for gait problem, neck pain and neck stiffness.  Skin: Negative for itching and rash.  Neurological: Positive for occasional lightheadedness. Negative for dizziness, extremity weakness, gait problem, headaches, and seizures.  Hematological: Negative for adenopathy. Positive for easy bruising on her  upper extremities.  Psychiatric/Behavioral: Negative for confusion, depression and sleep disturbance. The patient is not nervous/anxious.   PHYSICAL EXAMINATION:  Blood pressure (!) 144/65, pulse 73, temperature 98.4 F (36.9 C), temperature source Oral, resp. rate 15, weight 93 lb (42.2 kg), SpO2 97%.  ECOG PERFORMANCE STATUS: 1  Physical Exam  Constitutional: Oriented to person, place, and time and cachectic appearing female and in no distress.  HENT:  Head: Normocephalic and atraumatic.  Mouth/Throat: Positive for large sore on lower lip. Oropharynx is clear and moist. No oropharyngeal exudate.  Eyes: Conjunctivae are normal. Right eye exhibits no discharge. Left eye exhibits no discharge. No scleral icterus.  Neck: Normal range of motion. Neck supple.  Cardiovascular: Normal rate, regular rhythm, normal heart sounds and intact distal pulses.   Pulmonary/Chest: Effort normal. Quiet breath sounds in left lung. No respiratory distress. No wheezes. No rales.  Abdominal: Soft. Bowel sounds are normal. Exhibits no distension and no mass. There is no tenderness.  Musculoskeletal: Normal range of motion. Minimal pitting edema on ankles bilaterally. Lymphadenopathy:    No cervical adenopathy.  Neurological: Alert and oriented to person, place, and time. Exhibits muscle wasting.  examined in the wheelchair.  Skin: Skin is warm and dry. No rash noted. Not diaphoretic. No erythema. No pallor.  Psychiatric: Mood, memory and judgment normal.   LABORATORY DATA: Lab Results  Component Value Date   WBC 9.3 05/27/2023   HGB 11.5 (L) 05/27/2023   HCT 34.0 (L) 05/27/2023   MCV 89.0 05/27/2023   PLT 265 05/27/2023      Chemistry      Component Value Date/Time   NA 125 (L) 05/27/2023 1115   K 4.2 05/27/2023 1115   CL 91 (L) 05/27/2023 1115   CO2 25 05/27/2023 1115   BUN 14 05/27/2023 1115   CREATININE 0.44 05/27/2023 1115      Component Value Date/Time   CALCIUM 8.2 (L) 05/27/2023 1115    ALKPHOS 84 05/27/2023 1115   AST 15 05/27/2023 1115   ALT 5 05/27/2023 1115   BILITOT 0.3 05/27/2023 1115       RADIOGRAPHIC STUDIES:  No results found.   ASSESSMENT/PLAN:  This is a very pleasant 88 year old Caucasian female originally from [place].  She has been diagnosed with metastatic renal cell carcinoma.  She presented with a large left renal mass in addition to left hemithorax pleural-based metastasis as well as left hilar mediastinal lymphadenopathy.  She was diagnosed in 2022.  Her molecular studies show no actionable mutations and her PD-L1 expression is negative.  The patient is  currently undergoing treatment with immunotherapy with ipilimumab 1 mg/kg as well as nivolumab 3 mg/kg IV every 3 weeks status post 4 cycles treatment was discontinued due to disease progression.    The patient had disease progression after cycle #4 with left-sided pleural tumor with similar size of the left renal mass.  Dr. Arbutus Ped recommended adding Cabometyx 40 mg p.o. daily with her nivolumab IV every 4 weeks.  She started Cabometyx on 08/21/21.  She is status post 24 cycles.  Dr. Arbutus Ped reduce the dose to 20 mg p.o. daily at her appointment on 01/08/2022 due to intolerance with diarrhea.    Labs were reviewed. I reviewed her symptoms with Dr. Arbutus Ped. We will do EKG due to her palpitations, although she is not reporting symptoms at this time. If normal, she may need to see cardiology to consider heart monitor. She was advised to monitor her vitals when she feels palpitations.   We had a long discussion about her decreased appetite and trouble gaining weight. We discussed small frequent meals, increasing protein intake such as meat or protein drinks. If she does not like what is sold commercially, she can make her own protein drinks. Increasing her protein may help her mild ankle swelling bilaterally in addition to compression stockings and elevating her legs. She is a very picky eater and her son has  been having trouble with her agreeing to the recommendations. In the meantime, she can increase her remeron to 30 mg per day  at nighttime.   We have previously discussed that Tylenol is not addictive and if she needs more Tylenol for her rib pain for her cancer related pain from her malignancy that is okay.  She is scheduled to see palliative care today.  I talked to them about what we discussed at her office visit and they will let her know if they have any additional recommendations.  Recommend that she proceed with cycle #25 today scheduled.  See her back for follow-up visit in 4 weeks for evaluation repeat blood work before undergoing cycle #26  She will receive additional tylenol in the infusion room for a headache today.   Discussed I would recommend for her to get her flu shot.  She may also get her COVID-vaccine but recommend getting this in between her 4-week cycle.  The patient was advised to call immediately if she has any concerning symptoms in the interval. The patient voices understanding of current disease status and treatment options and is in agreement with the current care plan. All questions were answered. The patient knows to call the clinic with any problems, questions or concerns. We can certainly see the patient much sooner if necessary     Orders Placed This Encounter  Procedures   Ambulatory referral to Cardiology    Referral Priority:   Routine    Referral Type:   Consultation    Referral Reason:   Specialty Services Required    Number of Visits Requested:   1   EKG 12-Lead     The total time spent in the appointment was 30-39 minutes  Ceri Mayer L Neely Cecena, PA-C 05/27/23

## 2023-05-27 ENCOUNTER — Inpatient Hospital Stay: Payer: Medicaid Other

## 2023-05-27 ENCOUNTER — Inpatient Hospital Stay: Payer: Medicaid Other | Admitting: Physician Assistant

## 2023-05-27 ENCOUNTER — Encounter: Payer: Self-pay | Admitting: Nurse Practitioner

## 2023-05-27 ENCOUNTER — Inpatient Hospital Stay: Payer: Medicaid Other | Attending: Internal Medicine

## 2023-05-27 ENCOUNTER — Inpatient Hospital Stay (HOSPITAL_BASED_OUTPATIENT_CLINIC_OR_DEPARTMENT_OTHER): Payer: Medicaid Other | Admitting: Nurse Practitioner

## 2023-05-27 VITALS — BP 140/56 | HR 73 | Temp 97.9°F | Resp 20

## 2023-05-27 VITALS — BP 144/65 | HR 73 | Temp 98.4°F | Resp 15 | Wt 93.0 lb

## 2023-05-27 DIAGNOSIS — G893 Neoplasm related pain (acute) (chronic): Secondary | ICD-10-CM

## 2023-05-27 DIAGNOSIS — Z515 Encounter for palliative care: Secondary | ICD-10-CM | POA: Diagnosis not present

## 2023-05-27 DIAGNOSIS — Z5112 Encounter for antineoplastic immunotherapy: Secondary | ICD-10-CM | POA: Insufficient documentation

## 2023-05-27 DIAGNOSIS — Z79620 Long term (current) use of immunosuppressive biologic: Secondary | ICD-10-CM | POA: Insufficient documentation

## 2023-05-27 DIAGNOSIS — C642 Malignant neoplasm of left kidney, except renal pelvis: Secondary | ICD-10-CM

## 2023-05-27 DIAGNOSIS — R530 Neoplastic (malignant) related fatigue: Secondary | ICD-10-CM

## 2023-05-27 DIAGNOSIS — R630 Anorexia: Secondary | ICD-10-CM

## 2023-05-27 DIAGNOSIS — R002 Palpitations: Secondary | ICD-10-CM | POA: Diagnosis not present

## 2023-05-27 DIAGNOSIS — C7951 Secondary malignant neoplasm of bone: Secondary | ICD-10-CM | POA: Insufficient documentation

## 2023-05-27 LAB — CMP (CANCER CENTER ONLY)
ALT: 5 U/L (ref 0–44)
AST: 15 U/L (ref 15–41)
Albumin: 3.1 g/dL — ABNORMAL LOW (ref 3.5–5.0)
Alkaline Phosphatase: 84 U/L (ref 38–126)
Anion gap: 9 (ref 5–15)
BUN: 14 mg/dL (ref 8–23)
CO2: 25 mmol/L (ref 22–32)
Calcium: 8.2 mg/dL — ABNORMAL LOW (ref 8.9–10.3)
Chloride: 91 mmol/L — ABNORMAL LOW (ref 98–111)
Creatinine: 0.44 mg/dL (ref 0.44–1.00)
GFR, Estimated: >60 mL/min (ref >60)
Glucose, Bld: 116 mg/dL — ABNORMAL HIGH (ref 70–99)
Potassium: 4.2 mmol/L (ref 3.5–5.1)
Sodium: 125 mmol/L — ABNORMAL LOW (ref 135–145)
Total Bilirubin: 0.3 mg/dL (ref 0.3–1.2)
Total Protein: 6.2 g/dL — ABNORMAL LOW (ref 6.5–8.1)

## 2023-05-27 LAB — CBC WITH DIFFERENTIAL (CANCER CENTER ONLY)
Abs Immature Granulocytes: 0.04 10*3/uL (ref 0.00–0.07)
Basophils Absolute: 0.1 10*3/uL (ref 0.0–0.1)
Basophils Relative: 1 %
Eosinophils Absolute: 0 10*3/uL (ref 0.0–0.5)
Eosinophils Relative: 0 %
HCT: 34 % — ABNORMAL LOW (ref 36.0–46.0)
Hemoglobin: 11.5 g/dL — ABNORMAL LOW (ref 12.0–15.0)
Immature Granulocytes: 0 %
Lymphocytes Relative: 11 %
Lymphs Abs: 1.1 10*3/uL (ref 0.7–4.0)
MCH: 30.1 pg (ref 26.0–34.0)
MCHC: 33.8 g/dL (ref 30.0–36.0)
MCV: 89 fL (ref 80.0–100.0)
Monocytes Absolute: 0.9 10*3/uL (ref 0.1–1.0)
Monocytes Relative: 10 %
Neutro Abs: 7.2 10*3/uL (ref 1.7–7.7)
Neutrophils Relative %: 78 %
Platelet Count: 265 10*3/uL (ref 150–400)
RBC: 3.82 MIL/uL — ABNORMAL LOW (ref 3.87–5.11)
RDW: 14.5 % (ref 11.5–15.5)
WBC Count: 9.3 10*3/uL (ref 4.0–10.5)
nRBC: 0 % (ref 0.0–0.2)

## 2023-05-27 LAB — TSH: TSH: 2.234 u[IU]/mL (ref 0.350–4.500)

## 2023-05-27 MED ORDER — SODIUM CHLORIDE 0.9 % IV SOLN
480.0000 mg | Freq: Once | INTRAVENOUS | Status: AC
  Administered 2023-05-27: 480 mg via INTRAVENOUS
  Filled 2023-05-27: qty 48

## 2023-05-27 MED ORDER — SODIUM CHLORIDE 0.9 % IV SOLN: Freq: Once | INTRAVENOUS | Status: AC

## 2023-05-27 MED ORDER — MIRTAZAPINE 30 MG PO TABS: 15.0000 mg | ORAL_TABLET | Freq: Every day | ORAL | 2 refills | Status: DC

## 2023-05-27 MED ORDER — ACETAMINOPHEN 325 MG PO TABS
ORAL_TABLET | ORAL | Status: AC
  Filled 2023-05-27: qty 2

## 2023-05-27 MED ORDER — ACETAMINOPHEN 325 MG PO TABS
650.0000 mg | ORAL_TABLET | Freq: Once | ORAL | Status: AC
  Administered 2023-05-27: 650 mg via ORAL

## 2023-05-27 NOTE — Patient Instructions (Signed)
[place] [facility]  Discharge Instructions: Thank you for choosing [facility] to provide your oncology and hematology care.   If you have a lab appointment with the Cancer Center, please go directly to the Cancer Center and check in at the registration area.   Wear comfortable clothing and clothing appropriate for easy access to any Portacath or PICC line.   We strive to give you quality time with your provider. You may need to reschedule your appointment if you arrive late (15 or more minutes).  Arriving late affects you and other patients whose appointments are after yours.  Also, if you miss three or more appointments without notifying the office, you may be dismissed from the clinic at the provider's discretion.      For prescription refill requests, have your pharmacy contact our office and allow 72 hours for refills to be completed.    Today you received the following chemotherapy and/or immunotherapy agent: Nivolumab (Opdivo)    To help prevent nausea and vomiting after your treatment, we encourage you to take your nausea medication as directed.  BELOW ARE SYMPTOMS THAT SHOULD BE REPORTED IMMEDIATELY: *FEVER GREATER THAN 100.4 F (38 C) OR HIGHER *CHILLS OR SWEATING *NAUSEA AND VOMITING THAT IS NOT CONTROLLED WITH YOUR NAUSEA MEDICATION *UNUSUAL SHORTNESS OF BREATH *UNUSUAL BRUISING OR BLEEDING *URINARY PROBLEMS (pain or burning when urinating, or frequent urination) *BOWEL PROBLEMS (unusual diarrhea, constipation, pain near the anus) TENDERNESS IN MOUTH AND THROAT WITH OR WITHOUT PRESENCE OF ULCERS (sore throat, sores in mouth, or a toothache) UNUSUAL RASH, SWELLING OR PAIN  UNUSUAL VAGINAL DISCHARGE OR ITCHING   Items with * indicate a potential emergency and should be followed up as soon as possible or go to the Emergency Department if any problems should occur.  Please show the CHEMOTHERAPY ALERT CARD or IMMUNOTHERAPY ALERT CARD at  check-in to the Emergency Department and triage nurse.  Should you have questions after your visit or need to cancel or reschedule your appointment, please contact [place] [facility]  Dept: [phone]  and follow the prompts.  Office hours are 8:00 a.m. to 4:30 p.m. Monday - Friday. Please note that voicemails left after 4:00 p.m. may not be returned until the following business day.  We are closed weekends and major holidays. You have access to a nurse at all times for urgent questions. Please call the main number to the clinic Dept: [phone] and follow the prompts.   For any non-urgent questions, you may also contact your provider using MyChart. We now offer e-Visits for anyone 64 and older to request care online for non-urgent symptoms. For details visit [email or web address].   Also download the MyChart app! Go to the app store, search "MyChart", open the app, select [place], and log in with your MyChart username and password.

## 2023-05-27 NOTE — Progress Notes (Unsigned)
Palliative Medicine [facility]  Telephone:([phone] Fax:([phone]) [phone]   Name: Gail Fitzgerald Date: 05/27/2023 MRN: [id number]  DOB: [birth year]  Patient Care Team: [name], MD (Inactive) as PCP - General (Family Medicine)   REASON FOR CONSULTATION: Gail Fitzgerald is a 88 y.o. 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  Now actively receiving Opdivo and Cabometyx.  Palliative ask to see for symptom management and goals of care.   SOCIAL HISTORY:    Gail Fitzgerald reports that she has never smoked. She has never used smokeless tobacco. She reports that she does not currently use alcohol. She reports that she does not currently use drugs.  ADVANCE DIRECTIVES:  Patient does not have advanced directives.  Does not wish to discuss.  CODE STATUS: Full code  PAST MEDICAL HISTORY: Past Medical History:  Diagnosis Date   Hypertension     PAST SURGICAL HISTORY:  Past Surgical History:  Procedure Laterality Date   IR THORACENTESIS ASP PLEURAL SPACE W/IMG GUIDE  04/05/2021   THORACENTESIS N/A 04/12/2021   Procedure: Alanson Puls;  Surgeon: Josephine Igo, DO;  Location: [facility];  Service: Pulmonary;  Laterality: N/A;    HEMATOLOGY/ONCOLOGY HISTORY:  Oncology History  Renal cell carcinoma, left (HCC)  05/09/2021 Initial Diagnosis   Renal cell carcinoma, left (HCC)   07/24/2021 -  Chemotherapy   Patient is on Treatment Plan : RENAL CELL Nivolumab ([phone] q28d       ALLERGIES:  has No Known Allergies.  MEDICATIONS:  Current Outpatient Medications  Medication Sig Dispense Refill   acetaminophen (TYLENOL) 325 MG tablet Take 650 mg by mouth every 6 (six) hours as needed for headache.     amLODipine (NORVASC) 10 MG tablet Take 10 mg by mouth daily.      benazepril (LOTENSIN) 20 MG tablet Take 20 mg by mouth daily.     cabozantinib (CABOMETYX) 20 MG tablet Take 1 tablet (20 mg total) by mouth daily. Take on an empty stomach, 1 hour before or 2 hours after meals. 30 tablet 0   celecoxib (CELEBREX) 100 MG capsule TAKE 1 CAPSULE BY MOUTH EVERY DAY 30 capsule 2   ferrous sulfate 325 (65 FE) MG EC tablet TAKE 1 TABLET BY MOUTH EVERY DAY WITH BREAKFAST 90 tablet 1   lidocaine (LIDODERM) 5 % Place 1 patch onto the skin daily. Remove & Discard patch within 12 hours or as directed by MD 10 patch 1   magic mouthwash SOLN Take 5 mLs by mouth 3 (three) times daily as needed for mouth pain. 240 mL 0   metoprolol succinate (TOPROL-XL) 50 MG 24 hr tablet Take by mouth.     metoprolol tartrate (LOPRESSOR) 50 MG tablet Take 50 mg by mouth 2 (two) times daily.     mirtazapine (REMERON) 15 MG tablet Take 1 tablet (15 mg total) by mouth at bedtime. 30 tablet 2   Misc. Devices (WHEELCHAIR) MISC Light weight     OVER THE COUNTER MEDICATION Take 1 tablet by mouth daily as needed (for stomach discomfort). Allochol - (Herbal Supplement) Multivitamin with : Magnesium, potassium, garlic and charcoal. (Russian Medication)     pantoprazole (PROTONIX) 40 MG tablet Take 1 tablet (40 mg total) by mouth daily. 90 tablet 1   prochlorperazine (COMPAZINE) 10 MG tablet Take 1 tablet (10 mg total) by mouth every 6 (six)  hours as needed for nausea or vomiting. 30 tablet 2   simvastatin (ZOCOR) 20 MG tablet Take 20 mg by mouth every evening.     UNABLE TO FIND Take 1 tablet by mouth daily as needed (chest pain (angina)). Med Name: Validol (Menthyl isovalerate and menthol) (Russian Medication)     Valerian Root 100 MG CAPS Take 1 capsule by mouth daily as needed (for anxiety).     No current facility-administered medications for this visit.    VITAL SIGNS: There were no vitals taken for this visit. There were no vitals filed for this visit.   Estimated body mass index is 17.57  kg/m as calculated from the following:   Height as of 04/29/23: 5\' 1"  (1.549 m).   Weight as of 04/29/23: 93 lb (42.2 kg).  PERFORMANCE STATUS (ECOG) : 2 - Symptomatic, <50% confined to bed  Physical Exam General: NAD, sitting in recliner Cardiovascular: RRR  Pulmonary:normal breathing pattern  Neurological: alert, oriented x 4, [place] interpreter present  IMPRESSION: I saw Gail Fitzgerald during her infusion. Interpreter present. She is shares that she is not feeling her best today. Denies nausea, vomiting, constipation, or diarrhea. Denies headache or lightheadedness. No recent falls.   Endorses some fatigue. Associates some of her fatigue to her limited ability to exercise outside. States due to hot temperatures and most recently rain she has not been able to go to the local park or walk outside as part of her daily routine.   Complains of post nasal drainage in the morning. This improves with mucinex and warm teas. No color to drainage. Once she is able to cough and move around some things begin to improve. Denies cough or shortness of breath.   Gail Fitzgerald complains of decreased appetite. She is unable to tolerate many of the protein drinks due to her intolerance to dairy and stomach sensitivity. Her son and daughter-in-law makes sure she has the foods that she can tolerate and enjoy. She shares her appetite is minimal. Foods do not "taste well". She enjoys her hot tea. Denies nausea. We discussed focusing on small frequent meals versus large meals. Also discussed fresh lemon and ginger in her teas or hot water to neutralize palate and offer wakening of taste buds. She verbalized understanding. Currently taking mirtazapine. Per recommendations by Cassie, PA patient knows to increase to 30mg  to assist with appetite stimulation. We discussed different foods to increase protein intake. She verbalizes that she likes beans and seafood. Her weight is stable at 93lbs.     Neoplasm related pain Ms.  Fitzgerald reports occasional aches and pains to chest wall area. Reports some increase in discomfort with activity and certain movement. Grimaces during conversation holding area. Does not have discomfort daily. Confirms she is taking Celebrex and occasional Tylenol as needed. She knows she may take the Celebrex twice daily if needed. She prefers not to do this because her desire is to take less pills rather than more however has done so occasionally.  We discussed taking Tylenol while in infusion today. RN to administer.   Patient knows to contact office with any symptom management needs.    PLAN: Tylenol 1000 mg twice daily as needed. 650mg  during infusion today. Celebrex 100 mg daily.  May take an additional dose daily for increased pain. Protonix 40 mg daily for dyspepsia.  Continue healthy food choices to promote gut comfort and motility. Palliative will plan to see patient back in 6-8 weeks in collaboration to other oncology appointments. Aware to contact  office sooner with any symptom management needs.   Patient expressed understanding and was in agreement with this plan. She also understands that she can call the clinic at any time with any questions, concerns, or complaints.      Visit consisted of counseling and education dealing with the complex and emotionally intense issues of symptom management and palliative care in the setting of serious and potentially life-threatening illness.Greater than 50%  of this time was spent counseling and coordinating care related to the above assessment and plan.  Willette Alma, AGPCNP-BC  Palliative Medicine Team/[facility]  *Please note that this is a verbal dictation therefore any spelling or grammatical errors are due to the "Dragon Medical One" system interpretation.

## 2023-05-28 ENCOUNTER — Encounter: Payer: Self-pay | Admitting: Internal Medicine

## 2023-06-05 ENCOUNTER — Other Ambulatory Visit: Payer: Self-pay

## 2023-06-05 MED ORDER — CELECOXIB 100 MG PO CAPS: 100.0000 mg | ORAL_CAPSULE | Freq: Every day | ORAL | 2 refills | Status: DC

## 2023-06-05 NOTE — Telephone Encounter (Signed)
Patient daughter in law called for refill of medication see orders.

## 2023-06-12 ENCOUNTER — Other Ambulatory Visit (HOSPITAL_COMMUNITY): Payer: Self-pay | Admitting: Pharmacy Technician

## 2023-06-12 ENCOUNTER — Other Ambulatory Visit (HOSPITAL_COMMUNITY): Payer: Self-pay

## 2023-06-13 ENCOUNTER — Other Ambulatory Visit (HOSPITAL_COMMUNITY): Payer: Self-pay

## 2023-06-16 ENCOUNTER — Other Ambulatory Visit: Payer: Self-pay

## 2023-06-16 ENCOUNTER — Other Ambulatory Visit (HOSPITAL_COMMUNITY): Payer: Self-pay

## 2023-06-16 ENCOUNTER — Other Ambulatory Visit: Payer: Self-pay | Admitting: Internal Medicine

## 2023-06-16 DIAGNOSIS — C3490 Malignant neoplasm of unspecified part of unspecified bronchus or lung: Secondary | ICD-10-CM

## 2023-06-16 MED ORDER — CABOMETYX 20 MG PO TABS
20.0000 mg | ORAL_TABLET | Freq: Every day | ORAL | 0 refills | Status: DC
  Filled 2023-06-16: qty 30, 30d supply, fill #0

## 2023-06-16 NOTE — Progress Notes (Signed)
Refill received

## 2023-06-16 NOTE — Progress Notes (Signed)
Specialty Pharmacy Refill Coordination Note  Gail Fitzgerald is a 88 y.o. female contacted today regarding refills of specialty medication(s) Cabozantinib S-Malate (Antineoplastic Enzyme Inhibitors)  Spoke with Daughter in-law  Patient requested Delivery   Delivery date: 06/20/23   Verified address: [address]   Medication will be filled on 06/19/23.   Refill request sent to MD; Call if any delays

## 2023-06-19 ENCOUNTER — Other Ambulatory Visit: Payer: Self-pay

## 2023-06-24 ENCOUNTER — Inpatient Hospital Stay (HOSPITAL_BASED_OUTPATIENT_CLINIC_OR_DEPARTMENT_OTHER): Payer: Medicaid Other

## 2023-06-24 ENCOUNTER — Inpatient Hospital Stay: Payer: Medicaid Other | Attending: Internal Medicine

## 2023-06-24 ENCOUNTER — Inpatient Hospital Stay: Payer: Medicaid Other | Attending: Internal Medicine | Admitting: Internal Medicine

## 2023-06-24 VITALS — BP 120/60 | HR 65

## 2023-06-24 DIAGNOSIS — C782 Secondary malignant neoplasm of pleura: Secondary | ICD-10-CM | POA: Diagnosis not present

## 2023-06-24 DIAGNOSIS — C642 Malignant neoplasm of left kidney, except renal pelvis: Secondary | ICD-10-CM

## 2023-06-24 DIAGNOSIS — Z5112 Encounter for antineoplastic immunotherapy: Secondary | ICD-10-CM | POA: Insufficient documentation

## 2023-06-24 LAB — CMP (CANCER CENTER ONLY)
ALT: 5 U/L (ref 0–44)
AST: 14 U/L — ABNORMAL LOW (ref 15–41)
Albumin: 2.8 g/dL — ABNORMAL LOW (ref 3.5–5.0)
Alkaline Phosphatase: 82 U/L (ref 38–126)
Anion gap: 7 (ref 5–15)
BUN: 10 mg/dL (ref 8–23)
CO2: 27 mmol/L (ref 22–32)
Calcium: 7.9 mg/dL — ABNORMAL LOW (ref 8.9–10.3)
Chloride: 86 mmol/L — ABNORMAL LOW (ref 98–111)
Creatinine: 0.43 mg/dL — ABNORMAL LOW (ref 0.44–1.00)
GFR, Estimated: >60 mL/min (ref >60)
Glucose, Bld: 94 mg/dL (ref 70–99)
Potassium: 4 mmol/L (ref 3.5–5.1)
Sodium: 120 mmol/L — ABNORMAL LOW (ref 135–145)
Total Bilirubin: 0.4 mg/dL (ref 0.3–1.2)
Total Protein: 5.6 g/dL — ABNORMAL LOW (ref 6.5–8.1)

## 2023-06-24 LAB — CBC WITH DIFFERENTIAL (CANCER CENTER ONLY)
Abs Immature Granulocytes: 0.03 10*3/uL (ref 0.00–0.07)
Basophils Absolute: 0 10*3/uL (ref 0.0–0.1)
Basophils Relative: 0 %
Eosinophils Absolute: 0 10*3/uL (ref 0.0–0.5)
Eosinophils Relative: 0 %
HCT: 32.8 % — ABNORMAL LOW (ref 36.0–46.0)
Hemoglobin: 10.9 g/dL — ABNORMAL LOW (ref 12.0–15.0)
Immature Granulocytes: 0 %
Lymphocytes Relative: 6 %
Lymphs Abs: 0.6 10*3/uL — ABNORMAL LOW (ref 0.7–4.0)
MCH: 29.5 pg (ref 26.0–34.0)
MCHC: 33.2 g/dL (ref 30.0–36.0)
MCV: 88.6 fL (ref 80.0–100.0)
Monocytes Absolute: 1 10*3/uL (ref 0.1–1.0)
Monocytes Relative: 10 %
Neutro Abs: 8.2 10*3/uL — ABNORMAL HIGH (ref 1.7–7.7)
Neutrophils Relative %: 84 %
Platelet Count: 331 10*3/uL (ref 150–400)
RBC: 3.7 MIL/uL — ABNORMAL LOW (ref 3.87–5.11)
RDW: 14.2 % (ref 11.5–15.5)
WBC Count: 9.9 10*3/uL (ref 4.0–10.5)
nRBC: 0 % (ref 0.0–0.2)

## 2023-06-24 LAB — TSH: TSH: 3.069 u[IU]/mL (ref 0.350–4.500)

## 2023-06-24 MED ORDER — SODIUM CHLORIDE 1 G PO TABS: 1.0000 g | ORAL_TABLET | Freq: Two times a day (BID) | ORAL | 1 refills | Status: DC

## 2023-06-24 MED ORDER — MIRTAZAPINE 30 MG PO TABS: 30.0000 mg | ORAL_TABLET | Freq: Every day | ORAL | 2 refills | Status: DC

## 2023-06-24 MED ORDER — SODIUM CHLORIDE 0.9 % IV SOLN: Freq: Once | INTRAVENOUS | Status: AC

## 2023-06-24 MED ORDER — OXYCODONE-ACETAMINOPHEN 5-325 MG PO TABS
1.0000 | ORAL_TABLET | Freq: Three times a day (TID) | ORAL | 0 refills | Status: DC | PRN
Start: 2023-06-24 — End: 2023-07-22

## 2023-06-24 MED ORDER — SODIUM CHLORIDE 0.9 % IV SOLN
480.0000 mg | Freq: Once | INTRAVENOUS | Status: AC
  Administered 2023-06-24: 480 mg via INTRAVENOUS
  Filled 2023-06-24: qty 48

## 2023-06-24 MED ORDER — DEMECLOCYCLINE HCL 150 MG PO TABS: 150.0000 mg | ORAL_TABLET | Freq: Two times a day (BID) | ORAL | 2 refills | Status: DC

## 2023-06-24 MED ORDER — HYDROCORTISONE (PERIANAL) 2.5 % EX CREA: 1.0000 | TOPICAL_CREAM | Freq: Two times a day (BID) | CUTANEOUS | 0 refills | Status: DC

## 2023-06-24 NOTE — Progress Notes (Signed)
[facility] [phone]   Fax:([phone]  OFFICE PROGRESS NOTE  [name], MD (Inactive) [address] [place] [address]  DIAGNOSIS: Metastatic renal cell carcinoma presented with large left renal mass in addition to significant left hemothorax pleural-based metastasis as well as left hilar and mediastinal lymphadenopathy diagnosed in August 2022.  Biomarker Findings Microsatellite status - MS-Stable Tumor Mutational Burden - 2 Muts/Mb Genomic Findings For a complete list of the genes assayed, please refer to the Appendix. MTAP loss CDKN2A/B CDKN2A loss 8 Disease relevant genes with no reportable Alteration  PD-L1 expression 0%  PRIOR THERAPY: Systemic immunotherapy with ipilimumab 1 mg/KG in addition to nivolumab 3 mg/KG every 3 weeks for 4 cycles followed by maintenance treatment with nivolumab.  Status post 3 cycles.  First dose started on 05/22/2021.  This treatment was discontinued secondary to disease progression.   CURRENT THERAPY: Opdivo 480 Mg IV every 4 weeks with Cabometyx 40 mg p.o. daily.  First dose August 21, 2021.  Status post 25  month of treatment.  Her dose of Cabometyx will be reduced to 20 mg p.o. daily starting 01/08/2022 secondary to persistent diarrhea.  INTERVAL HISTORY: Gail Fitzgerald 88 y.o. female is to the clinic today for follow-up visit accompanied by her daughter-in-law and her [place] interpreter [place]. Discussed the use of AI scribe software for clinical note transcription with the patient, who gave verbal consent to proceed.  History of Present Illness   The patient, an 88 year old [place] female with a diagnosis of stage 4 left kidney cancer, presents with persistent symptoms and side effects from her ongoing treatment. The initial diagnosis was made in August 2022, and the patient was started on a regimen of ipilimumab and nivolumab. However, due to the cancer's progression, the treatment  was adjusted to include nivolumab and an oral drug, Cabometyx, which the patient is currently taking.  Over the past month, the patient has been experiencing increased fatigue, with simple activities such as cooking breakfast leading to shortness of breath and exhaustion. She also reports episodes of dizziness, described as a 'spinning' sensation in the head. Additionally, the patient has noticed swelling in her legs, more pronounced on the right side, which has made walking difficult. Her walking distance has significantly decreased from one mile to half a mile on her best days.  The patient also reports a loss of appetite and altered taste sensation, describing food as tasting like 'grass.' Despite these symptoms, her weight has increased from 93 pounds to 100.4 pounds over the past month, which may be attributed to the leg swelling.  The patient has been experiencing pain in the left kidney area, where the cancer is located. This pain is chronic and has been present since the initial diagnosis. She also reports backaches and a persistent dry cough, which is not relieved by Mucinex. The patient has been using warm salt water gargles to alleviate the cough.  The patient's lab results indicate low sodium levels, with the most recent reading at 120, significantly lower than the normal range starting at 135. This hyponatremia is a concern due to the risk of seizures. The patient also reports nocturnal polyuria, waking up multiple times at night to urinate after drinking water.  The patient's other complaints include hemorrhoids and the need for prescription underwear. She has been taking mirtazapine for appetite stimulation and depression, and the dosage was recently increased.       MEDICAL HISTORY: Past Medical History:  Diagnosis Date   Hypertension     ALLERGIES:  has No Known Allergies.  MEDICATIONS:  Current Outpatient Medications  Medication Sig Dispense Refill   acetaminophen (TYLENOL)  325 MG tablet Take 650 mg by mouth every 6 (six) hours as needed for headache.     amLODipine (NORVASC) 10 MG tablet Take 10 mg by mouth daily.     benazepril (LOTENSIN) 20 MG tablet Take 20 mg by mouth daily.     cabozantinib (CABOMETYX) 20 MG tablet Take 1 tablet (20 mg total) by mouth daily. Take on an empty stomach, 1 hour before or 2 hours after meals. 30 tablet 0   celecoxib (CELEBREX) 100 MG capsule Take 1 capsule (100 mg total) by mouth daily. 30 capsule 2   ferrous sulfate 325 (65 FE) MG EC tablet TAKE 1 TABLET BY MOUTH EVERY DAY WITH BREAKFAST 90 tablet 1   lidocaine (LIDODERM) 5 % Place 1 patch onto the skin daily. Remove & Discard patch within 12 hours or as directed by MD 10 patch 1   magic mouthwash SOLN Take 5 mLs by mouth 3 (three) times daily as needed for mouth pain. 240 mL 0   metoprolol succinate (TOPROL-XL) 50 MG 24 hr tablet Take by mouth.     metoprolol tartrate (LOPRESSOR) 50 MG tablet Take 50 mg by mouth 2 (two) times daily.     mirtazapine (REMERON) 30 MG tablet Take 0.5 tablets (15 mg total) by mouth at bedtime. 30 tablet 2   Misc. Devices (WHEELCHAIR) MISC Light weight     OVER THE COUNTER MEDICATION Take 1 tablet by mouth daily as needed (for stomach discomfort). Allochol - (Herbal Supplement) Multivitamin with : Magnesium, potassium, garlic and charcoal. (Russian Medication)     pantoprazole (PROTONIX) 40 MG tablet Take 1 tablet (40 mg total) by mouth daily. 90 tablet 1   prochlorperazine (COMPAZINE) 10 MG tablet Take 1 tablet (10 mg total) by mouth every 6 (six) hours as needed for nausea or vomiting. 30 tablet 2   simvastatin (ZOCOR) 20 MG tablet Take 20 mg by mouth every evening.     UNABLE TO FIND Take 1 tablet by mouth daily as needed (chest pain (angina)). Med Name: Validol (Menthyl isovalerate and menthol) (Russian Medication)     Valerian Root 100 MG CAPS Take 1 capsule by mouth daily as needed (for anxiety).     No current facility-administered medications  for this visit.    SURGICAL HISTORY:  Past Surgical History:  Procedure Laterality Date   IR THORACENTESIS ASP PLEURAL SPACE W/IMG GUIDE  04/05/2021   THORACENTESIS N/A 04/12/2021   Procedure: Alanson Puls;  Surgeon: Josephine Igo, DO;  Location: [facility];  Service: Pulmonary;  Laterality: N/A;    REVIEW OF SYSTEMS:  Constitutional: positive for fatigue Eyes: negative Ears, nose, mouth, throat, and face: negative Respiratory: positive for dyspnea on exertion and pleurisy/chest pain Cardiovascular: negative Gastrointestinal: positive for diarrhea Genitourinary:negative Integument/breast: negative Hematologic/lymphatic: negative Musculoskeletal:positive for muscle weakness Neurological: negative Behavioral/Psych: negative Endocrine: negative Allergic/Immunologic: negative   PHYSICAL EXAMINATION: General appearance: alert, cooperative, fatigued, and no distress Head: Normocephalic, without obvious abnormality, atraumatic Neck: no adenopathy, no JVD, supple, symmetrical, trachea midline, and thyroid not enlarged, symmetric, no tenderness/mass/nodules Lymph nodes: Cervical, supraclavicular, and axillary nodes normal. Resp: diminished breath sounds LLL and dullness to percussion LLL Back: symmetric, no curvature. ROM normal. No CVA tenderness. Cardio: regular rate and rhythm, S1, S2 normal, no murmur, click, rub or gallop GI: soft, non-tender; bowel sounds normal;  no masses,  no organomegaly Extremities: edema 1+ edema bilaterally Neurologic: Alert and oriented X 3, normal strength and tone. Normal symmetric reflexes. Normal coordination and gait  ECOG PERFORMANCE STATUS: 1 - Symptomatic but completely ambulatory  Blood pressure (!) 140/69, pulse 60, temperature 98 F (36.7 C), temperature source Oral, resp. rate 16, height 5\' 1"  (1.549 m), weight 100 lb 6.4 oz (45.5 kg), SpO2 95%.  LABORATORY DATA: Lab Results  Component Value Date   WBC 9.9 06/24/2023   HGB 10.9 (L)  06/24/2023   HCT 32.8 (L) 06/24/2023   MCV 88.6 06/24/2023   PLT 331 06/24/2023      Chemistry      Component Value Date/Time   NA 125 (L) 05/27/2023 1115   K 4.2 05/27/2023 1115   CL 91 (L) 05/27/2023 1115   CO2 25 05/27/2023 1115   BUN 14 05/27/2023 1115   CREATININE 0.44 05/27/2023 1115      Component Value Date/Time   CALCIUM 8.2 (L) 05/27/2023 1115   ALKPHOS 84 05/27/2023 1115   AST 15 05/27/2023 1115   ALT 5 05/27/2023 1115   BILITOT 0.3 05/27/2023 1115       RADIOGRAPHIC STUDIES: No results found.  ASSESSMENT AND PLAN: This is a 88 years old white female who originally from [place] with metastatic renal cell carcinoma presented with large left renal mass in addition to left hemithorax pleural-based metastasis as well as left hilar and mediastinal lymphadenopathy diagnosed in 2022.  Her molecular studies showed no actionable mutations and PD-L1 expression was negative. The patient had MRI of the brain performed recently that showed no evidence of metastatic disease to the brain. She started systemic treatment with immunotherapy with ipilimumab 1 mg/KG as well as nivolumab 3 mg/KG every 3 weeks status post 3 cycles.  This treatment was discontinued secondary to disease progression. She is currently on treatment with a combination of nivolumab 480 Mg IV every 4 weeks in addition to Cabometyx 40 mg p.o. daily status post 22 cycles. Her dose of Cabometyx was reduced to 20 mg p.o. daily starting cycle #6 secondary to drug-induced diarrhea.    Stage 4 Left Kidney Cancer Progression on initial immunotherapy (ipilimumab and nivolumab), currently on nivolumab and Cabometyx 20mg  daily. Reports fatigue, shortness of breath, and decreased appetite. -Continue nivolumab and Cabometyx as current regimen. - Repeat CT scan of the chest, abdomen pelvis as well as MRI of the brain before her next visit in 4 weeks. -Consider supportive care measures for symptom  management.  Hyponatremia Sodium level significantly low at 120. -Start salt tablets and demeclocycline 150mg  twice daily. -Restrict water intake to around 1 liter per day. -Consider IV normal saline if available.  Lower Extremity Edema Bilateral lower extremity swelling, more pronounced on the right. No current diuretic therapy. -Order Doppler ultrasound of lower extremities to rule out deep vein thrombosis. -Consider starting low-dose Lasix if no blood clots are detected. -Advise elevation of legs when sitting or lying down. -Consider use of compression socks.  Chronic Pain Reports chronic pain, likely related to tumor burden. -Prescribe Percocet for pain management.  Cough Reports dry cough, currently using Mucinex. -Continue Mucinex as needed.  Hemorrhoids Reports bleeding hemorrhoids. -Prescribe hemorrhoid cream.  General Health Maintenance -Contact primary care physician regarding prescription for incontinence underwear. -Continue mirtazapine for appetite and mood, adjust dose as needed.   For the anemia, she will continue on the oral iron tablets. For the acid reflux she is currently on Protonix and she was also  advised to use probiotic for the bloating. The patient was advised to call immediately if she has any other concerning symptoms in the interval. The patient voices understanding of current disease status and treatment options and is in agreement with the current care plan. The total time spent in the appointment was 30 minutes.  All questions were answered. The patient knows to call the clinic with any problems, questions or concerns. We can certainly see the patient much sooner if necessary.  The total time spent in the appointment was 55 minutes.  Disclaimer: This note was dictated with voice recognition software. Similar sounding words can inadvertently be transcribed and may not be corrected upon review.

## 2023-06-24 NOTE — Patient Instructions (Signed)
[facility]  Discharge Instructions: Thank you for choosing [facility] to provide your oncology and hematology care.   If you have a lab appointment with the Cancer Center, please go directly to the Cancer Center and check in at the registration area.   Wear comfortable clothing and clothing appropriate for easy access to any Portacath or PICC line.   We strive to give you quality time with your provider. You may need to reschedule your appointment if you arrive late (15 or more minutes).  Arriving late affects you and other patients whose appointments are after yours.  Also, if you miss three or more appointments without notifying the office, you may be dismissed from the clinic at the provider's discretion.      For prescription refill requests, have your pharmacy contact our office and allow 72 hours for refills to be completed.    Today you received the following chemotherapy and/or immunotherapy agent: Nivolumab (Opdivo)    To help prevent nausea and vomiting after your treatment, we encourage you to take your nausea medication as directed.  BELOW ARE SYMPTOMS THAT SHOULD BE REPORTED IMMEDIATELY: *FEVER GREATER THAN 100.4 F (38 C) OR HIGHER *CHILLS OR SWEATING *NAUSEA AND VOMITING THAT IS NOT CONTROLLED WITH YOUR NAUSEA MEDICATION *UNUSUAL SHORTNESS OF BREATH *UNUSUAL BRUISING OR BLEEDING *URINARY PROBLEMS (pain or burning when urinating, or frequent urination) *BOWEL PROBLEMS (unusual diarrhea, constipation, pain near the anus) TENDERNESS IN MOUTH AND THROAT WITH OR WITHOUT PRESENCE OF ULCERS (sore throat, sores in mouth, or a toothache) UNUSUAL RASH, SWELLING OR PAIN  UNUSUAL VAGINAL DISCHARGE OR ITCHING   Items with * indicate a potential emergency and should be followed up as soon as possible or go to the Emergency Department if any problems should occur.  Please show the CHEMOTHERAPY ALERT CARD or IMMUNOTHERAPY ALERT CARD at  check-in to the Emergency Department and triage nurse.  Should you have questions after your visit or need to cancel or reschedule your appointment, please contact [facility]  Dept: [phone]  and follow the prompts.  Office hours are 8:00 a.m. to 4:30 p.m. Monday - Friday. Please note that voicemails left after 4:00 p.m. may not be returned until the following business day.  We are closed weekends and major holidays. You have access to a nurse at all times for urgent questions. Please call the main number to the clinic Dept: [phone] and follow the prompts.   For any non-urgent questions, you may also contact your provider using MyChart. We now offer e-Visits for anyone 25 and older to request care online for non-urgent symptoms. For details visit [email or web address].   Also download the MyChart app! Go to the app store, search "MyChart", open the app, select [facility], and log in with your MyChart username and password.

## 2023-06-25 ENCOUNTER — Telehealth: Payer: Self-pay

## 2023-06-25 LAB — T4: T4, Total: 7.6 ug/dL (ref 4.5–12.0)

## 2023-06-25 NOTE — Telephone Encounter (Signed)
Notified Patient of prior authorization approval for Oxycodone with Acetaminophen 5/325 mg tablets and Demeclocycline 150 mg tablets. Oxycodone is approved through 12/21/2023 and Demeclocycline is approved through 06/23/2024. No other needs or concerns voiced at this time.

## 2023-06-29 ENCOUNTER — Emergency Department (HOSPITAL_COMMUNITY): Payer: Medicaid Other

## 2023-06-29 ENCOUNTER — Other Ambulatory Visit: Payer: Self-pay

## 2023-06-29 ENCOUNTER — Encounter (HOSPITAL_COMMUNITY): Payer: Self-pay

## 2023-06-29 ENCOUNTER — Inpatient Hospital Stay (HOSPITAL_COMMUNITY)
Admission: EM | Admit: 2023-06-29 | Discharge: 2023-07-03 | DRG: 180 | Disposition: A | Discharge status: Home Health | Payer: Medicaid Other | Attending: Internal Medicine | Admitting: Internal Medicine

## 2023-06-29 DIAGNOSIS — I10 Essential (primary) hypertension: Secondary | ICD-10-CM | POA: Diagnosis not present

## 2023-06-29 DIAGNOSIS — Z85528 Personal history of other malignant neoplasm of kidney: Secondary | ICD-10-CM | POA: Diagnosis not present

## 2023-06-29 DIAGNOSIS — E222 Syndrome of inappropriate secretion of antidiuretic hormone: Secondary | ICD-10-CM | POA: Diagnosis present

## 2023-06-29 DIAGNOSIS — D638 Anemia in other chronic diseases classified elsewhere: Secondary | ICD-10-CM | POA: Diagnosis not present

## 2023-06-29 DIAGNOSIS — Z7189 Other specified counseling: Secondary | ICD-10-CM | POA: Diagnosis not present

## 2023-06-29 DIAGNOSIS — D630 Anemia in neoplastic disease: Secondary | ICD-10-CM | POA: Diagnosis present

## 2023-06-29 DIAGNOSIS — C642 Malignant neoplasm of left kidney, except renal pelvis: Secondary | ICD-10-CM | POA: Diagnosis not present

## 2023-06-29 DIAGNOSIS — D509 Iron deficiency anemia, unspecified: Secondary | ICD-10-CM | POA: Diagnosis present

## 2023-06-29 DIAGNOSIS — I82401 Acute embolism and thrombosis of unspecified deep veins of right lower extremity: Secondary | ICD-10-CM | POA: Diagnosis present

## 2023-06-29 DIAGNOSIS — C7802 Secondary malignant neoplasm of left lung: Secondary | ICD-10-CM | POA: Diagnosis present

## 2023-06-29 DIAGNOSIS — E43 Unspecified severe protein-calorie malnutrition: Secondary | ICD-10-CM | POA: Diagnosis present

## 2023-06-29 DIAGNOSIS — Z66 Do not resuscitate: Secondary | ICD-10-CM | POA: Diagnosis present

## 2023-06-29 DIAGNOSIS — J90 Pleural effusion, not elsewhere classified: Secondary | ICD-10-CM

## 2023-06-29 DIAGNOSIS — C7801 Secondary malignant neoplasm of right lung: Principal | ICD-10-CM | POA: Diagnosis present

## 2023-06-29 DIAGNOSIS — J910 Malignant pleural effusion: Secondary | ICD-10-CM | POA: Diagnosis present

## 2023-06-29 DIAGNOSIS — I351 Nonrheumatic aortic (valve) insufficiency: Secondary | ICD-10-CM | POA: Diagnosis present

## 2023-06-29 DIAGNOSIS — Z681 Body mass index (BMI) 19 or less, adult: Secondary | ICD-10-CM | POA: Diagnosis not present

## 2023-06-29 DIAGNOSIS — I5031 Acute diastolic (congestive) heart failure: Secondary | ICD-10-CM | POA: Diagnosis present

## 2023-06-29 DIAGNOSIS — E871 Hypo-osmolality and hyponatremia: Secondary | ICD-10-CM

## 2023-06-29 DIAGNOSIS — I82451 Acute embolism and thrombosis of right peroneal vein: Secondary | ICD-10-CM

## 2023-06-29 DIAGNOSIS — J9811 Atelectasis: Secondary | ICD-10-CM | POA: Diagnosis present

## 2023-06-29 DIAGNOSIS — I444 Left anterior fascicular block: Secondary | ICD-10-CM | POA: Diagnosis present

## 2023-06-29 DIAGNOSIS — R64 Cachexia: Secondary | ICD-10-CM | POA: Diagnosis present

## 2023-06-29 DIAGNOSIS — R54 Age-related physical debility: Secondary | ICD-10-CM | POA: Diagnosis present

## 2023-06-29 DIAGNOSIS — M7989 Other specified soft tissue disorders: Secondary | ICD-10-CM

## 2023-06-29 DIAGNOSIS — E8809 Other disorders of plasma-protein metabolism, not elsewhere classified: Secondary | ICD-10-CM | POA: Diagnosis present

## 2023-06-29 DIAGNOSIS — Z515 Encounter for palliative care: Secondary | ICD-10-CM

## 2023-06-29 DIAGNOSIS — I5033 Acute on chronic diastolic (congestive) heart failure: Secondary | ICD-10-CM

## 2023-06-29 DIAGNOSIS — I7121 Aneurysm of the ascending aorta, without rupture: Secondary | ICD-10-CM | POA: Diagnosis present

## 2023-06-29 DIAGNOSIS — J9601 Acute respiratory failure with hypoxia: Secondary | ICD-10-CM | POA: Diagnosis present

## 2023-06-29 DIAGNOSIS — Z711 Person with feared health complaint in whom no diagnosis is made: Secondary | ICD-10-CM | POA: Diagnosis not present

## 2023-06-29 DIAGNOSIS — Z789 Other specified health status: Secondary | ICD-10-CM | POA: Diagnosis not present

## 2023-06-29 DIAGNOSIS — Z7901 Long term (current) use of anticoagulants: Secondary | ICD-10-CM | POA: Diagnosis not present

## 2023-06-29 DIAGNOSIS — I110 Hypertensive heart disease with heart failure: Secondary | ICD-10-CM | POA: Diagnosis present

## 2023-06-29 DIAGNOSIS — Z603 Acculturation difficulty: Secondary | ICD-10-CM | POA: Diagnosis present

## 2023-06-29 DIAGNOSIS — R600 Localized edema: Secondary | ICD-10-CM | POA: Diagnosis not present

## 2023-06-29 DIAGNOSIS — Z7984 Long term (current) use of oral hypoglycemic drugs: Secondary | ICD-10-CM

## 2023-06-29 DIAGNOSIS — Z79899 Other long term (current) drug therapy: Secondary | ICD-10-CM

## 2023-06-29 HISTORY — DX: Malignant neoplasm of unspecified kidney, except renal pelvis: C64.9

## 2023-06-29 LAB — CBC WITH DIFFERENTIAL/PLATELET
Abs Immature Granulocytes: 0.04 10*3/uL (ref 0.00–0.07)
Basophils Absolute: 0 10*3/uL (ref 0.0–0.1)
Basophils Relative: 0 %
Eosinophils Absolute: 0 10*3/uL (ref 0.0–0.5)
Eosinophils Relative: 0 %
HCT: 33.3 % — ABNORMAL LOW (ref 36.0–46.0)
Hemoglobin: 10.9 g/dL — ABNORMAL LOW (ref 12.0–15.0)
Immature Granulocytes: 1 %
Lymphocytes Relative: 10 %
Lymphs Abs: 0.9 10*3/uL (ref 0.7–4.0)
MCH: 28.8 pg (ref 26.0–34.0)
MCHC: 32.7 g/dL (ref 30.0–36.0)
MCV: 87.9 fL (ref 80.0–100.0)
Monocytes Absolute: 0.8 10*3/uL (ref 0.1–1.0)
Monocytes Relative: 10 %
Neutro Abs: 6.7 10*3/uL (ref 1.7–7.7)
Neutrophils Relative %: 79 %
Platelets: 322 10*3/uL (ref 150–400)
RBC: 3.79 MIL/uL — ABNORMAL LOW (ref 3.87–5.11)
RDW: 14.5 % (ref 11.5–15.5)
WBC: 8.4 10*3/uL (ref 4.0–10.5)
nRBC: 0 % (ref 0.0–0.2)

## 2023-06-29 LAB — MAGNESIUM: Magnesium: 1.4 mg/dL — ABNORMAL LOW (ref 1.7–2.4)

## 2023-06-29 LAB — COMPREHENSIVE METABOLIC PANEL
ALT: 9 U/L (ref 0–44)
AST: 16 U/L (ref 15–41)
Albumin: 2.2 g/dL — ABNORMAL LOW (ref 3.5–5.0)
Alkaline Phosphatase: 82 U/L (ref 38–126)
Anion gap: 13 (ref 5–15)
BUN: 12 mg/dL (ref 8–23)
CO2: 27 mmol/L (ref 22–32)
Calcium: 8 mg/dL — ABNORMAL LOW (ref 8.9–10.3)
Chloride: 81 mmol/L — ABNORMAL LOW (ref 98–111)
Creatinine, Ser: 0.5 mg/dL (ref 0.44–1.00)
GFR, Estimated: >60 mL/min (ref >60)
Glucose, Bld: 96 mg/dL (ref 70–99)
Potassium: 4.4 mmol/L (ref 3.5–5.1)
Sodium: 121 mmol/L — ABNORMAL LOW (ref 135–145)
Total Bilirubin: 0.8 mg/dL (ref 0.3–1.2)
Total Protein: 5.8 g/dL — ABNORMAL LOW (ref 6.5–8.1)

## 2023-06-29 LAB — BRAIN NATRIURETIC PEPTIDE: B Natriuretic Peptide: 509 pg/mL — ABNORMAL HIGH (ref 0.0–100.0)

## 2023-06-29 LAB — TROPONIN I (HIGH SENSITIVITY)
Troponin I (High Sensitivity): 9 ng/L (ref <18)
Troponin I (High Sensitivity): 12 ng/L (ref <18)

## 2023-06-29 MED ORDER — ACETAMINOPHEN 650 MG RE SUPP: 650.0000 mg | Freq: Four times a day (QID) | RECTAL | Status: DC | PRN

## 2023-06-29 MED ORDER — APIXABAN 5 MG PO TABS
5.0000 mg | ORAL_TABLET | Freq: Two times a day (BID) | ORAL | Status: DC
  Administered 2023-06-29 – 2023-07-03 (×8): 5 mg via ORAL
  Filled 2023-06-29 (×8): qty 1

## 2023-06-29 MED ORDER — IOHEXOL 350 MG/ML SOLN
75.0000 mL | Freq: Once | INTRAVENOUS | Status: AC | PRN
  Administered 2023-06-29: 75 mL via INTRAVENOUS

## 2023-06-29 MED ORDER — MELATONIN 3 MG PO TABS: 3.0000 mg | ORAL_TABLET | Freq: Every evening | ORAL | Status: DC | PRN

## 2023-06-29 MED ORDER — ONDANSETRON HCL 4 MG/2ML IJ SOLN: 4.0000 mg | Freq: Four times a day (QID) | INTRAMUSCULAR | Status: DC | PRN

## 2023-06-29 MED ORDER — MAGNESIUM SULFATE 2 GM/50ML IV SOLN
2.0000 g | Freq: Once | INTRAVENOUS | Status: AC
  Administered 2023-06-29: 2 g via INTRAVENOUS
  Filled 2023-06-29: qty 50

## 2023-06-29 MED ORDER — ACETAMINOPHEN 325 MG PO TABS
650.0000 mg | ORAL_TABLET | Freq: Four times a day (QID) | ORAL | Status: DC | PRN
  Administered 2023-06-30 – 2023-07-03 (×3): 650 mg via ORAL
  Filled 2023-06-29 (×4): qty 2

## 2023-06-29 MED ORDER — FUROSEMIDE 10 MG/ML IJ SOLN
20.0000 mg | Freq: Two times a day (BID) | INTRAMUSCULAR | Status: DC
  Administered 2023-06-29 – 2023-06-30 (×2): 20 mg via INTRAVENOUS
  Filled 2023-06-29 (×2): qty 2

## 2023-06-29 NOTE — ED Provider Notes (Signed)
[place] EMERGENCY DEPARTMENT AT [facility] Provider Note   CSN: [id number] Arrival date & time: 06/29/23  1615     History  Chief Complaint  Patient presents with   Shortness of Breath   Leg Swelling    Gail Fitzgerald is a 88 y.o. female.  Pt is an 88y/o female with hx of metastatic renal cell carcinoma presented with large left renal mass in addition to significant left hemothorax pleural-based metastasis currently on nivolumab and an oral drug, Cabometyx, who has required prior thoracentesis but has been several years who is presenting today with gradually worsening shortness of breath and swelling in her lower extremities.  Patient reports through her interpreter that the swelling in her legs has been present for several months but has been gradually worsening but has seemed to get significantly worse in the last 2 weeks.  She also notices worsening shortness of breath that is worse with lying down and also when she has coughing fits.  She will have coughing fits with sitting up that will last sometimes up to 20 minutes to the point where she will bring up a little bit of mucus but denies any fever or new productive cough.  Family reports that she has a very poor appetite and does not eat much at all.  She continues to be generally weak.  She has had no recent medication changes.  She feels like there is pressure and pain over her chest and shoulders but denies any history of cardiac disease.  She gets scans every 3 months and family reports that CT of her renal cancer has not changed at all the mets to the lungs is indeterminate.  The history is provided by the patient, medical records and a relative. The history is limited by a language barrier. A language interpreter was used.  Shortness of Breath      Home Medications Prior to Admission medications   Medication Sig Start Date End Date Taking? Authorizing Provider  acetaminophen (TYLENOL) 325 MG tablet Take 650 mg  by mouth every 6 (six) hours as needed for headache.    [provider]  amLODipine (NORVASC) 10 MG tablet Take 10 mg by mouth daily. 09/14/21   [provider]  benazepril (LOTENSIN) 20 MG tablet Take 20 mg by mouth daily. 06/11/21   [provider]  cabozantinib (CABOMETYX) 20 MG tablet Take 1 tablet (20 mg total) by mouth daily. Take on an empty stomach, 1 hour before or 2 hours after meals. 06/16/23   Si Gaul, MD  celecoxib (CELEBREX) 100 MG capsule Take 1 capsule (100 mg total) by mouth daily. 06/05/23   Pickenpack-Cousar, Arty Baumgartner, NP  demeclocycline (DECLOMYCIN) 150 MG tablet Take 1 tablet (150 mg total) by mouth 2 (two) times daily. 06/24/23   Si Gaul, MD  ferrous sulfate 325 (65 FE) MG EC tablet TAKE 1 TABLET BY MOUTH EVERY DAY WITH BREAKFAST 02/09/22   Heilingoetter, Cassandra L, PA-C  hydrocortisone (ANUSOL-HC) 2.5 % rectal cream Place 1 Application rectally 2 (two) times daily. 06/24/23   Si Gaul, MD  lidocaine (LIDODERM) 5 % Place 1 patch onto the skin daily. Remove & Discard patch within 12 hours or as directed by MD 04/01/23   Heilingoetter, Cassandra L, PA-C  magic mouthwash SOLN Take 5 mLs by mouth 3 (three) times daily as needed for mouth pain. 04/01/23   Heilingoetter, Cassandra L, PA-C  metoprolol succinate (TOPROL-XL) 50 MG 24 hr tablet Take by mouth. 12/12/20   [provider]  metoprolol tartrate (LOPRESSOR) 50 MG tablet Take 50 mg by mouth 2 (two) times daily. 03/11/22   [provider]  mirtazapine (REMERON) 30 MG tablet Take 1 tablet (30 mg total) by mouth at bedtime. 06/24/23   Si Gaul, MD  Misc. Devices [facility]) MISC Light weight 09/18/21   [provider]  OVER THE COUNTER MEDICATION Take 1 tablet by mouth daily as needed (for stomach discomfort). Allochol - (Herbal Supplement) Multivitamin with : Magnesium, potassium, garlic and charcoal. (Russian Medication)    [provider]   oxyCODONE-acetaminophen (PERCOCET/ROXICET) 5-325 MG tablet Take 1 tablet by mouth every 8 (eight) hours as needed for severe pain (pain score 7-10). 06/24/23   Si Gaul, MD  pantoprazole (PROTONIX) 40 MG tablet Take 1 tablet (40 mg total) by mouth daily. 08/20/22   Pickenpack-Cousar, Arty Baumgartner, NP  prochlorperazine (COMPAZINE) 10 MG tablet Take 1 tablet (10 mg total) by mouth every 6 (six) hours as needed for nausea or vomiting. 09/28/21   Si Gaul, MD  simvastatin (ZOCOR) 20 MG tablet Take 20 mg by mouth every evening.    [provider]  sodium chloride 1 g tablet Take 1 tablet (1 g total) by mouth 2 (two) times daily with a meal. 06/24/23   Si Gaul, MD  UNABLE TO FIND Take 1 tablet by mouth daily as needed (chest pain (angina)). Med Name: Validol (Menthyl isovalerate and menthol) (Russian Medication)    [provider]  Valerian Root 100 MG CAPS Take 1 capsule by mouth daily as needed (for anxiety).    [provider]      Allergies    Patient has no known allergies.    Review of Systems   Review of Systems  Respiratory:  Positive for shortness of breath.     Physical Exam Updated Vital Signs BP (!) 152/108   Pulse 73   Temp 98.4 F (36.9 C) (Oral)   Resp 15   Ht 5\' 1"  (1.549 m)   Wt 45.4 kg   SpO2 94%   BMI 18.89 kg/m  Physical Exam Vitals and nursing note reviewed.  Constitutional:      General: She is not in acute distress.    Appearance: She is well-developed.     Comments: Chronically ill-appearing with central wasting  HENT:     Head: Normocephalic and atraumatic.  Eyes:     Pupils: Pupils are equal, round, and reactive to light.  Cardiovascular:     Rate and Rhythm: Normal rate and regular rhythm.     Heart sounds: Normal heart sounds. No murmur heard.    No friction rub.  Pulmonary:     Effort: Pulmonary effort is normal.     Breath sounds: Decreased air movement present. Examination of the left-upper field  reveals decreased breath sounds. Examination of the left-middle field reveals decreased breath sounds. Examination of the right-lower field reveals decreased breath sounds. Examination of the left-lower field reveals decreased breath sounds. Decreased breath sounds present. No wheezing or rales.  Abdominal:     General: Bowel sounds are normal. There is no distension.     Palpations: Abdomen is soft.     Tenderness: There is no abdominal tenderness. There is no guarding or rebound.  Musculoskeletal:        General: No tenderness. Normal range of motion.     Right lower leg: Edema present.     Left lower leg: Edema present.     Comments: Significant pitting edema  in bilateral lower extremities up to the groin.  Greater than 3+ from the feet to the knee bilaterally  Skin:    General: Skin is warm and dry.     Findings: No rash.  Neurological:     Mental Status: She is alert and oriented to person, place, and time. Mental status is at baseline.     Cranial Nerves: No cranial nerve deficit.  Psychiatric:        Behavior: Behavior normal.     ED Results / Procedures / Treatments   Labs (all labs ordered are listed, but only abnormal results are displayed) Labs Reviewed  CBC WITH DIFFERENTIAL/PLATELET - Abnormal; Notable for the following components:      Result Value   RBC 3.79 (*)    Hemoglobin 10.9 (*)    HCT 33.3 (*)    All other components within normal limits  COMPREHENSIVE METABOLIC PANEL - Abnormal; Notable for the following components:   Sodium 121 (*)    Chloride 81 (*)    Calcium 8.0 (*)    Total Protein 5.8 (*)    Albumin 2.2 (*)    All other components within normal limits  BRAIN NATRIURETIC PEPTIDE - Abnormal; Notable for the following components:   B Natriuretic Peptide 509.0 (*)    All other components within normal limits  MAGNESIUM - Abnormal; Notable for the following components:   Magnesium 1.4 (*)    All other components within normal limits  TROPONIN I  (HIGH SENSITIVITY)  TROPONIN I (HIGH SENSITIVITY)    EKG EKG Interpretation Date/Time:  Sunday June 29 2023 16:36:57 EDT Ventricular Rate:  72 PR Interval:  138 QRS Duration:  78 QT Interval:  384 QTC Calculation: 420 R Axis:   -52  Text Interpretation: Normal sinus rhythm Low voltage QRS Left anterior fascicular block Cannot rule out Anterior infarct , age undetermined No significant change since last tracing When compared with ECG of 27-May-2023 14:21, PREVIOUS ECG IS PRESENT Confirmed by Gwyneth Sprout ([id number]) on 06/29/2023 5:29:45 PM  Radiology CT Angio Chest PE W and/or Wo Contrast  Result Date: 06/29/2023 CLINICAL DATA:  Pulmonary embolism (PE) suspected, high prob. Shortness of breath. Metastatic renal carcinoma EXAM: CT ANGIOGRAPHY CHEST WITH CONTRAST TECHNIQUE: Multidetector CT imaging of the chest was performed using the standard protocol during bolus administration of intravenous contrast. Multiplanar CT image reconstructions and MIPs were obtained to evaluate the vascular anatomy. RADIATION DOSE REDUCTION: This exam was performed according to the departmental dose-optimization program which includes automated exposure control, adjustment of the mA and/or kV according to patient size and/or use of iterative reconstruction technique. CONTRAST:  75mL OMNIPAQUE IOHEXOL 350 MG/ML SOLN COMPARISON:  CT chest abdomen pelvis 04/22/2023 FINDINGS: Cardiovascular: Satisfactory opacification of the pulmonary arteries to the segmental level. No evidence of pulmonary embolism. Normal heart size. No significant pericardial effusion. Stable enlarged ascending thoracic aorta measuring up to 4.2 cm. The descending thoracic aorta is normal in caliber. No atherosclerotic plaque of the thoracic aorta. No coronary artery calcifications. Mediastinum/Nodes: No enlarged mediastinal, hilar, or axillary lymph nodes. Thyroid gland, trachea, and esophagus demonstrate no significant findings. Lungs/Pleura:  Passive atelectasis of the left lung with ground-glass airspace opacities of the remaining portion of aerated left lungs. Stable partially calcified 1.3 x 0.9 cm right apical pulmonary nodule (7:18). No pulmonary mass. Interval increase in size of a moderate right pleural effusion. Interval worsening of moderate loculated irregular left pleural effusions. Irregular pleural effusions with likely underlying deposits appears to be invasive  along the mediastinum. No pneumothorax. Upper Abdomen: Markedly limited evaluation due to timing of contrast. Cholelithiasis. Severe atherosclerotic plaque. Musculoskeletal: No chest wall abnormality. Diffusely decreased bone density. Chronic appearing L1 vertebral body height loss. No suspicious lytic or blastic osseous lesions. No acute displaced fracture. Review of the MIP images confirms the above findings. IMPRESSION: 1. No pulmonary embolus. 2. Interval increase in size of a moderate right pleural effusion. 3. Interval worsening of moderate loculated irregular left pleural effusions. Irregular pleural effusions with likely underlying deposits appears to be invasive along the mediastinum. Markedly limited evaluation on this noncontrast study. 4. Passive atelectasis of the left lung with ground-glass airspace opacities of the remaining portion of aerated left lungs. 5. Stable partially calcified 1.3 x 0.9 cm right apical pulmonary nodule 6. Stable aneurysmal ascending thoracic aorta (4.2 cm.) Recommend annual imaging followup by CTA or MRA. This recommendation follows 2010 ACCF/AHA/AATS/ACR/ASA/SCA/SCAI/SIR/STS/SVM Guidelines for the Diagnosis and Management of Patients with Thoracic Aortic Disease. Circulation. 2010; 121: M578-I696. Aortic aneurysm NOS (ICD10-I71.9). 7.  Aortic Atherosclerosis (ICD10-I70.0). 8. Cholelithiasis. Electronically Signed   By: Tish Frederickson M.D.   On: 06/29/2023 20:49   DG Chest 2 View  Result Date: 06/29/2023 CLINICAL DATA:  Shortness of  breath. Metastatic renal cell carcinoma EXAM: CHEST - 2 VIEW COMPARISON:  04/22/2023 FINDINGS: Stable heart size. Aortic atherosclerosis. Bilateral pleural effusions with prominent masslike left pleural thickening compatible with known metastatic disease. Patchy airspace opacities throughout the left lung. Milder interstitial opacities within the right lung. No pneumothorax. Chronic mild wedge compression deformity of L1. IMPRESSION: 1. Bilateral pleural effusions with prominent masslike left pleural thickening compatible with known metastatic disease. 2. Patchy airspace opacities throughout the left lung and milder interstitial opacities within the right lung, which may represent asymmetric edema or infection. Electronically Signed   By: Duanne Guess D.O.   On: 06/29/2023 19:37   VAS [place] LOWER EXTREMITY VENOUS (DVT) (ONLY [facility] & [facility])  Result Date: 06/29/2023  Lower Venous DVT Study Patient Name:  EMRI CADD  Date of Exam:   06/29/2023 Medical Rec #: [id number]         Accession #:    [id number] Date of Birth: [birth year]         Patient Gender: F Patient Age:   50 years Exam Location:  [facility] Procedure:      VAS [place] LOWER EXTREMITY VENOUS (DVT) Referring Phys: Nehemiah Settle SMALL --------------------------------------------------------------------------------  Indications: Swelling, and Edema. Other Indications: Patient positioning. Risk Factors: Cancer Hx past pregnancy. Limitations: Body habitus. Performing Technologist: Shona Simpson  Examination Guidelines: A complete evaluation includes B-mode imaging, spectral Doppler, color Doppler, and power Doppler as needed of all accessible portions of each vessel. Bilateral testing is considered an integral part of a complete examination. Limited examinations for reoccurring indications may be performed as noted. The reflux portion of the exam is performed with the patient in reverse Trendelenburg.   +---------+---------------+---------+-----------+----------+-----------------+ RIGHT    CompressibilityPhasicitySpontaneityPropertiesThrombus Aging    +---------+---------------+---------+-----------+----------+-----------------+ CFV      Full           Yes      Yes                                    +---------+---------------+---------+-----------+----------+-----------------+ SFJ      Full                                                           +---------+---------------+---------+-----------+----------+-----------------+  FV Prox  Full                                                           +---------+---------------+---------+-----------+----------+-----------------+ FV Mid   Full                                                           +---------+---------------+---------+-----------+----------+-----------------+ FV DistalFull                                                           +---------+---------------+---------+-----------+----------+-----------------+ PFV      Full                                                           +---------+---------------+---------+-----------+----------+-----------------+ POP      Full           Yes      Yes                                    +---------+---------------+---------+-----------+----------+-----------------+ PTV      Full                                                           +---------+---------------+---------+-----------+----------+-----------------+ PERO     Partial                            dilated   Age Indeterminate +---------+---------------+---------+-----------+----------+-----------------+ 1/2 Peroneal veins seen to be partially thrombosed from the proximal to mid calf.  +----+---------------+---------+-----------+----------+--------------+ LEFTCompressibilityPhasicitySpontaneityPropertiesThrombus Aging  +----+---------------+---------+-----------+----------+--------------+ CFV Full           Yes      Yes                                 +----+---------------+---------+-----------+----------+--------------+    Summary: RIGHT: - Findings consistent with age indeterminate deep vein thrombosis involving the right peroneal veins.  - No cystic structure found in the popliteal fossa.  LEFT: - No evidence of common femoral vein obstruction.   *See table(s) above for measurements and observations.    Preliminary     Procedures Procedures    Medications Ordered in ED Medications  iohexol (OMNIPAQUE) 350 MG/ML injection 75 mL (75 mLs Intravenous Contrast Given 06/29/23 1936)    ED Course/ Medical Decision Making/ A&P  Medical Decision Making Amount and/or Complexity of Data Reviewed External Data Reviewed: notes. Labs: ordered. Decision-making details documented in ED Course. Radiology: ordered and independent interpretation performed. Decision-making details documented in ED Course. ECG/medicine tests: ordered and independent interpretation performed. Decision-making details documented in ED Course.  Risk Prescription drug management. Decision regarding hospitalization.   Pt with multiple medical problems and comorbidities and presenting today with a complaint that caries a high risk for morbidity and mortality.  Here today with worsening shortness of breath and lower extremity swelling.  Concern for recurrent pleural effusions possibly related to metastases versus CHF versus pericardial effusion versus electrolyte abnormalities and third spacing with low albumin versus PE DVT versus worsening of patient's cancer causing compression on lower extremity vessels.  Patient is in no acute distress at rest and sats are 95% on room air.  DVT study today radiology does note findings are consistent with age-indeterminate DVT in the right peroneal veins but no other  findings.  I independently interpreted patient's EKG which showed no acute findings today.  9:00 PM I independently interpreted patient's labs and CBC without acute changes, magnesium mildly low at 1.4, CMP with persistent hyponatremia with sodium of 121, normal renal function and low albumin of 2.2.  BNP today elevated at 500 without old to compare.  I have independently visualized and interpreted pt's images today. CXR with left pleural effusion involving the complete lung and mild right effusion.  Will get CTA to r/o clot and also to further clarify effusion.  9:00 PM CT PE without evidence of PE today but worsening right-sided pleural effusion and worsening moderate loculated irregular left pleural effusion along with underlying deposits appearing to be evasive along the mediastinum and some passive atelectasis.  Stable aneurysmal ascending thoracic aorta that needs yearly follow-up.  Will admit the patient today due to worsening pleural effusion, worsening shortness of breath, worsening swelling with persistently low sodium with hyponatremia of 120.  Patient will most likely need thoracentesis tomorrow.  Also may need echo to ensure no additional heart failure to see if she may improve with Lasix.  Also patient may need to be started on anticoagulation for the DVT in her right lower extremity however we will hold until we know she is good to have a thoracentesis or not.  Consulted hospitalist for admission.  Family and patient are comfortable with this plan.        Final Clinical Impression(s) / ED Diagnoses Final diagnoses:  Peripheral edema  Hyponatremia  Pleural effusion    Rx / DC Orders ED Discharge Orders     None         Gwyneth Sprout, MD 06/29/23 2100

## 2023-06-29 NOTE — Progress Notes (Signed)
PHARMACY - ANTICOAGULATION CONSULT NOTE  Pharmacy Consult for Eliquis  Indication: DVT, age indeterminate   No Known Allergies  Patient Measurements: Height: 5\' 1"  (154.9 cm) Weight: 45.4 kg (100 lb) IBW/kg (Calculated) : 47.8  Vital Signs: Temp: 98.4 F (36.9 C) (10/20 1623) Temp Source: Oral (10/20 1623) BP: 146/70 (10/20 2100) Pulse Rate: 70 (10/20 2100)  Labs: Recent Labs    06/29/23 1636 06/29/23 1736 06/29/23 1933  HGB 10.9*  --   --   HCT 33.3*  --   --   PLT 322  --   --   CREATININE 0.50  --   --   TROPONINIHS  --  9 12    Estimated Creatinine Clearance: 36.2 mL/min (by C-G formula based on SCr of 0.5 mg/dL).   Medical History: Past Medical History:  Diagnosis Date   Hypertension     Assessment: Patient admitted with age indeterminate DVT. Not on anticoagulation PTA but notable hx for stage 4 kidney cancer. Discussed with MD - due to patient age, low body weight she is at a high bleeding risk. Due to DVT being age indeterminate and high bleeding risk, MD elected to NOT load patient with Eliquis.   HgB 10.9 and PLTs 322.    Goal of Therapy:  Monitor platelets by anticoagulation protocol: Yes   Plan:  Eliquis 5mg  BID.  F/u co-pay check in AM.  Patient will need education.   Estill Batten, PharmD, BCCCP  06/29/2023,9:51 PM

## 2023-06-29 NOTE — ED Notes (Signed)
ED TO INPATIENT HANDOFF REPORT  ED Nurse Name and Phone #: Gail Fanning, RN  S Name/Age/Gender Gail Fitzgerald 88 y.o. female Room/Bed: 018C/018C  Code Status   Code Status: Full Code  Home/SNF/Other Home Patient oriented to: self, place, time, and situation Is this baseline? Yes   Triage Complete: Triage complete  Chief Complaint Acute diastolic heart failure (HCC) [I50.31]  Triage Note Bilateral lower extremity swelling with pitting edema.  Complains of shortness of  breath as well.  Swelling worse than it usually is.    Allergies No Known Allergies  Level of Care/Admitting Diagnosis ED Disposition     ED Disposition  Admit   Condition  --   Comment  Hospital Area: [facility] [[id number]]  Level of Care: Telemetry Medical [104]  May admit patient to [facility] or [facility] if equivalent level of care is available:: No  Covid Evaluation: Asymptomatic - no recent exposure (last 10 days) testing not required  Diagnosis: Acute diastolic heart failure (HCC) [428.31.ICD-9-CM]  Admitting Physician: Angie Fava [[id number]]  Attending Physician: Angie Fava [[id number]]  Certification:: I certify this patient will need inpatient services for at least 2 midnights  Expected Medical Readiness: 07/01/2023          B Medical/Surgery History Past Medical History:  Diagnosis Date   Hypertension    Past Surgical History:  Procedure Laterality Date   IR THORACENTESIS ASP PLEURAL SPACE W/IMG GUIDE  04/05/2021   THORACENTESIS N/A 04/12/2021   Procedure: Alanson Puls;  Surgeon: Josephine Igo, DO;  Location: [facility];  Service: Pulmonary;  Laterality: N/A;     A IV Location/Drains/Wounds Patient Lines/Drains/Airways Status     Active Line/Drains/Airways     Name Placement date Placement time Site Days   Peripheral IV 06/29/23 20 G 1" Anterior;Right Forearm 06/29/23  1735  Forearm  less than 1            Intake/Output Last 24  hours No intake or output data in the 24 hours ending 06/29/23 2153  Labs/Imaging Results for orders placed or performed during the hospital encounter of 06/29/23 (from the past 48 hour(s))  CBC with Differential     Status: Abnormal   Collection Time: 06/29/23  4:36 PM  Result Value Ref Range   WBC 8.4 4.0 - 10.5 K/uL   RBC 3.79 (L) 3.87 - 5.11 MIL/uL   Hemoglobin 10.9 (L) 12.0 - 15.0 g/dL   HCT 21.3 (L) 08.6 - 57.8 %   MCV 87.9 80.0 - 100.0 fL   MCH 28.8 26.0 - 34.0 pg   MCHC 32.7 30.0 - 36.0 g/dL   RDW 46.9 62.9 - 52.8 %   Platelets 322 150 - 400 K/uL   nRBC 0.0 0.0 - 0.2 %   Neutrophils Relative % 79 %   Neutro Abs 6.7 1.7 - 7.7 K/uL   Lymphocytes Relative 10 %   Lymphs Abs 0.9 0.7 - 4.0 K/uL   Monocytes Relative 10 %   Monocytes Absolute 0.8 0.1 - 1.0 K/uL   Eosinophils Relative 0 %   Eosinophils Absolute 0.0 0.0 - 0.5 K/uL   Basophils Relative 0 %   Basophils Absolute 0.0 0.0 - 0.1 K/uL   Immature Granulocytes 1 %   Abs Immature Granulocytes 0.04 0.00 - 0.07 K/uL    Comment: Performed at [facility], [address], [place], [place] [address]  Comprehensive metabolic panel     Status: Abnormal   Collection Time: 06/29/23  4:36  PM  Result Value Ref Range   Sodium 121 (L) 135 - 145 mmol/L   Potassium 4.4 3.5 - 5.1 mmol/L   Chloride 81 (L) 98 - 111 mmol/L   CO2 27 22 - 32 mmol/L   Glucose, Bld 96 70 - 99 mg/dL    Comment: Glucose reference range applies only to samples taken after fasting for at least 8 hours.   BUN 12 8 - 23 mg/dL   Creatinine, Ser 6.57 0.44 - 1.00 mg/dL   Calcium 8.0 (L) 8.9 - 10.3 mg/dL   Total Protein 5.8 (L) 6.5 - 8.1 g/dL   Albumin 2.2 (L) 3.5 - 5.0 g/dL   AST 16 15 - 41 U/L   ALT 9 0 - 44 U/L   Alkaline Phosphatase 82 38 - 126 U/L   Total Bilirubin 0.8 0.3 - 1.2 mg/dL   GFR, Estimated >84 >69 mL/min    Comment: (NOTE) Calculated using the CKD-EPI Creatinine Equation (2021)    Anion gap 13 5 - 15    Comment: Performed at [facility], [address], [place], [place] [address]  Brain natriuretic peptide     Status: Abnormal   Collection Time: 06/29/23  4:36 PM  Result Value Ref Range   B Natriuretic Peptide 509.0 (H) 0.0 - 100.0 pg/mL    Comment: Performed at [facility], [address], [place], [place] [address]  Troponin I (High Sensitivity)     Status: None   Collection Time: 06/29/23  5:36 PM  Result Value Ref Range   Troponin I (High Sensitivity) 9 <18 ng/L    Comment: (NOTE) Elevated high sensitivity troponin I (hsTnI) values and significant  changes across serial measurements may suggest ACS but many other  chronic and acute conditions are known to elevate hsTnI results.  Refer to the "Links" section for chest pain algorithms and additional  guidance. Performed at [facility], [address], [place], [place] [address]   Magnesium     Status: Abnormal   Collection Time: 06/29/23  5:36 PM  Result Value Ref Range   Magnesium 1.4 (L) 1.7 - 2.4 mg/dL    Comment: Performed at [facility], [address], [place], [place] [address]  Troponin I (High Sensitivity)     Status: None   Collection Time: 06/29/23  7:33 PM  Result Value Ref Range   Troponin I (High Sensitivity) 12 <18 ng/L    Comment: (NOTE) Elevated high sensitivity troponin I (hsTnI) values and significant  changes across serial measurements may suggest ACS but many other  chronic and acute conditions are known to elevate hsTnI results.  Refer to the "Links" section for chest pain algorithms and additional  guidance. Performed at [facility], [address], [place], [place] [address]    CT Angio Chest PE W and/or Wo Contrast  Result Date: 06/29/2023 CLINICAL DATA:  Pulmonary embolism (PE) suspected, high prob. Shortness of breath. Metastatic renal carcinoma EXAM: CT ANGIOGRAPHY CHEST WITH CONTRAST TECHNIQUE: Multidetector CT imaging of the chest was performed using the standard protocol during bolus  administration of intravenous contrast. Multiplanar CT image reconstructions and MIPs were obtained to evaluate the vascular anatomy. RADIATION DOSE REDUCTION: This exam was performed according to the departmental dose-optimization program which includes automated exposure control, adjustment of the mA and/or kV according to patient size and/or use of iterative reconstruction technique. CONTRAST:  75mL OMNIPAQUE IOHEXOL 350 MG/ML SOLN COMPARISON:  CT chest abdomen  pelvis 04/22/2023 FINDINGS: Cardiovascular: Satisfactory opacification of the pulmonary arteries to the segmental level. No evidence of pulmonary embolism. Normal heart size. No significant pericardial effusion. Stable enlarged ascending thoracic aorta measuring up to 4.2 cm. The descending thoracic aorta is normal in caliber. No atherosclerotic plaque of the thoracic aorta. No coronary artery calcifications. Mediastinum/Nodes: No enlarged mediastinal, hilar, or axillary lymph nodes. Thyroid gland, trachea, and esophagus demonstrate no significant findings. Lungs/Pleura: Passive atelectasis of the left lung with ground-glass airspace opacities of the remaining portion of aerated left lungs. Stable partially calcified 1.3 x 0.9 cm right apical pulmonary nodule (7:18). No pulmonary mass. Interval increase in size of a moderate right pleural effusion. Interval worsening of moderate loculated irregular left pleural effusions. Irregular pleural effusions with likely underlying deposits appears to be invasive along the mediastinum. No pneumothorax. Upper Abdomen: Markedly limited evaluation due to timing of contrast. Cholelithiasis. Severe atherosclerotic plaque. Musculoskeletal: No chest wall abnormality. Diffusely decreased bone density. Chronic appearing L1 vertebral body height loss. No suspicious lytic or blastic osseous lesions. No acute displaced fracture. Review of the MIP images confirms the above findings. IMPRESSION: 1. No pulmonary embolus. 2.  Interval increase in size of a moderate right pleural effusion. 3. Interval worsening of moderate loculated irregular left pleural effusions. Irregular pleural effusions with likely underlying deposits appears to be invasive along the mediastinum. Markedly limited evaluation on this noncontrast study. 4. Passive atelectasis of the left lung with ground-glass airspace opacities of the remaining portion of aerated left lungs. 5. Stable partially calcified 1.3 x 0.9 cm right apical pulmonary nodule 6. Stable aneurysmal ascending thoracic aorta (4.2 cm.) Recommend annual imaging followup by CTA or MRA. This recommendation follows 2010 ACCF/AHA/AATS/ACR/ASA/SCA/SCAI/SIR/STS/SVM Guidelines for the Diagnosis and Management of Patients with Thoracic Aortic Disease. Circulation. 2010; 121: Y865-H846. Aortic aneurysm NOS (ICD10-I71.9). 7.  Aortic Atherosclerosis (ICD10-I70.0). 8. Cholelithiasis. Electronically Signed   By: Tish Frederickson M.D.   On: 06/29/2023 20:49   DG Chest 2 View  Result Date: 06/29/2023 CLINICAL DATA:  Shortness of breath. Metastatic renal cell carcinoma EXAM: CHEST - 2 VIEW COMPARISON:  04/22/2023 FINDINGS: Stable heart size. Aortic atherosclerosis. Bilateral pleural effusions with prominent masslike left pleural thickening compatible with known metastatic disease. Patchy airspace opacities throughout the left lung. Milder interstitial opacities within the right lung. No pneumothorax. Chronic mild wedge compression deformity of L1. IMPRESSION: 1. Bilateral pleural effusions with prominent masslike left pleural thickening compatible with known metastatic disease. 2. Patchy airspace opacities throughout the left lung and milder interstitial opacities within the right lung, which may represent asymmetric edema or infection. Electronically Signed   By: Duanne Guess D.O.   On: 06/29/2023 19:37   VAS [place] LOWER EXTREMITY VENOUS (DVT) (ONLY [facility])  Result Date: 06/29/2023  Lower Venous DVT Study  Patient Name:  TYNESIA HARTLIEB  Date of Exam:   06/29/2023 Medical Rec #: [id number]         Accession #:    [id number] Date of Birth: 06/18/1935         Patient Gender: F Patient Age:   75 years Exam Location:  [facility] Procedure:      VAS [place] LOWER EXTREMITY VENOUS (DVT) Referring Phys: Nehemiah Settle SMALL --------------------------------------------------------------------------------  Indications: Swelling, and Edema. Other Indications: Patient positioning. Risk Factors: Cancer Hx past pregnancy. Limitations: Body habitus. Performing Technologist: Shona Simpson  Examination Guidelines: A complete evaluation includes B-mode imaging, spectral Doppler, color Doppler, and power Doppler as needed of all accessible portions of each vessel.  Bilateral testing is considered an integral part of a complete examination. Limited examinations for reoccurring indications may be performed as noted. The reflux portion of the exam is performed with the patient in reverse Trendelenburg.  +---------+---------------+---------+-----------+----------+-----------------+ RIGHT    CompressibilityPhasicitySpontaneityPropertiesThrombus Aging    +---------+---------------+---------+-----------+----------+-----------------+ CFV      Full           Yes      Yes                                    +---------+---------------+---------+-----------+----------+-----------------+ SFJ      Full                                                           +---------+---------------+---------+-----------+----------+-----------------+ FV Prox  Full                                                           +---------+---------------+---------+-----------+----------+-----------------+ FV Mid   Full                                                           +---------+---------------+---------+-----------+----------+-----------------+ FV DistalFull                                                            +---------+---------------+---------+-----------+----------+-----------------+ PFV      Full                                                           +---------+---------------+---------+-----------+----------+-----------------+ POP      Full           Yes      Yes                                    +---------+---------------+---------+-----------+----------+-----------------+ PTV      Full                                                           +---------+---------------+---------+-----------+----------+-----------------+ PERO     Partial                            dilated   Age Indeterminate +---------+---------------+---------+-----------+----------+-----------------+ 1/2 Peroneal veins seen to be partially  thrombosed from the proximal to mid calf.  +----+---------------+---------+-----------+----------+--------------+ LEFTCompressibilityPhasicitySpontaneityPropertiesThrombus Aging +----+---------------+---------+-----------+----------+--------------+ CFV Full           Yes      Yes                                 +----+---------------+---------+-----------+----------+--------------+    Summary: RIGHT: - Findings consistent with age indeterminate deep vein thrombosis involving the right peroneal veins.  - No cystic structure found in the popliteal fossa.  LEFT: - No evidence of common femoral vein obstruction.   *See table(s) above for measurements and observations.    Preliminary     Pending Labs Unresulted Labs (From admission, onward)     Start     Ordered   06/30/23 0500  CBC with Differential/Platelet  Tomorrow morning,   R        06/29/23 2129   06/30/23 0500  Comprehensive metabolic panel  Tomorrow morning,   R        06/29/23 2129   06/30/23 0500  Magnesium  Tomorrow morning,   R        06/29/23 2129   06/30/23 0500  Phosphorus  Tomorrow morning,   R        06/29/23 2129   06/30/23 0500  Lactate dehydrogenase  Tomorrow morning,   R         06/29/23 2134            Vitals/Pain Today's Vitals   06/29/23 1900 06/29/23 2000 06/29/23 2030 06/29/23 2100  BP: (!) 152/108 (!) 158/67 119/64 (!) 146/70  Pulse: 73 79 69 70  Resp: 15 18 15 18   Temp:      TempSrc:      SpO2: 94% 95% 94% 96%  Weight:      Height:      PainSc:        Isolation Precautions No active isolations  Medications Medications  acetaminophen (TYLENOL) tablet 650 mg (has no administration in time range)    Or  acetaminophen (TYLENOL) suppository 650 mg (has no administration in time range)  melatonin tablet 3 mg (has no administration in time range)  ondansetron (ZOFRAN) injection 4 mg (has no administration in time range)  furosemide (LASIX) injection 20 mg (has no administration in time range)  magnesium sulfate IVPB 2 g 50 mL (has no administration in time range)  iohexol (OMNIPAQUE) 350 MG/ML injection 75 mL (75 mLs Intravenous Contrast Given 06/29/23 1936)    Mobility walks with device     Focused Assessments Pulmonary Assessment Handoff:  Lung sounds:   O2 Device: Room Air      R Recommendations: See Admitting Provider Note  Report given to:   Additional Notes: Pt is A&O x4, pt speaks [place]

## 2023-06-29 NOTE — Progress Notes (Signed)
Lower extremity venous duplex completed. Please see CV Procedures for preliminary results.  Shona Simpson, RVT 06/29/23 5:14 PM

## 2023-06-29 NOTE — ED Provider Triage Note (Signed)
Emergency Medicine Provider Triage Evaluation Note  Gail Fitzgerald , a 88 y.o. female  was evaluated in triage.  Pt complains of swelling of bilateral feet/legs (R>L) and shortness of breath and coughx several days, worse at 2AM today. Denies fever, chills. Hx of Stage 4 cancer.  Review of Systems  Positive: Edema, sob Negative: Chest pain  Physical Exam  BP (!) 151/65 (BP Location: Left Arm)   Pulse 71   Temp 98.4 F (36.9 C) (Oral)   Resp 19   SpO2 94%  Gen:   Awake, no distress   Resp:  Normal effort  MSK:   Moves extremities without difficulty  Other:  +2+ pitting edema of BLE, crackles of upper lobes  Medical Decision Making  Medically screening exam initiated at 4:26 PM.  Appropriate orders placed.  Gail Fitzgerald was informed that the remainder of the evaluation will be completed by another provider, this initial triage assessment does not replace that evaluation, and the importance of remaining in the ED until their evaluation is complete.     Pete Pelt, [place] 06/29/23 [id number]

## 2023-06-29 NOTE — ED Triage Notes (Signed)
Bilateral lower extremity swelling with pitting edema.  Complains of shortness of  breath as well.  Swelling worse than it usually is.

## 2023-06-29 NOTE — H&P (Signed)
History and Physical      Gail Fitzgerald EGB:[id number] DOB: 11/25/1934 DOA: 06/29/2023; DOS: 06/29/2023  PCP: [facility] *** Patient coming from: home ***  I have personally briefly reviewed patient's old medical records in [facility]  Chief Complaint: ***  HPI: Gail Fitzgerald is a 88 y.o. female with medical history significant for *** who is admitted to [facility] on 06/29/2023 with *** after presenting from home*** to [facility] ED complaining of ***.    ***       ***   ED Course:  Vital signs in the ED were notable for the following: ***  Labs were notable for the following: ***  Per my interpretation, EKG in ED demonstrated the following:  ***  Imaging in the ED, per corresponding formal radiology read, was notable for the following:  ***  While in the ED, the following were administered: ***  Subsequently, the patient was admitted  ***  ***red    Review of Systems: As per HPI otherwise 10 point review of systems negative.   Past Medical History:  Diagnosis Date   Hypertension     Past Surgical History:  Procedure Laterality Date   IR THORACENTESIS ASP PLEURAL SPACE W/IMG GUIDE  04/05/2021   THORACENTESIS N/A 04/12/2021   Procedure: Alanson Puls;  Surgeon: Josephine Igo, DO;  Location: [facility];  Service: Pulmonary;  Laterality: N/A;    Social History:  reports that she has never smoked. She has never used smokeless tobacco. She reports that she does not currently use alcohol. She reports that she does not currently use drugs.   No Known Allergies  Family History  Problem Relation Age of Onset   Diabetes Neg Hx    Kidney disease Neg Hx     Family history reviewed and not pertinent ***   Prior to Admission medications   Medication Sig Start Date End Date Taking? Authorizing Provider  acetaminophen (TYLENOL) 325 MG tablet Take 650 mg by mouth every 6 (six) hours as needed for headache.   Yes  [provider]  amLODipine (NORVASC) 10 MG tablet Take 5 mg by mouth daily. 09/14/21  Yes [provider]  benazepril (LOTENSIN) 20 MG tablet Take 20 mg by mouth daily. 06/11/21  Yes [provider]  cabozantinib (CABOMETYX) 20 MG tablet Take 1 tablet (20 mg total) by mouth daily. Take on an empty stomach, 1 hour before or 2 hours after meals. 06/16/23  Yes Si Gaul, MD  celecoxib (CELEBREX) 100 MG capsule Take 1 capsule (100 mg total) by mouth daily. 06/05/23  Yes Pickenpack-Cousar, Arty Baumgartner, NP  ferrous sulfate 325 (65 FE) MG EC tablet TAKE 1 TABLET BY MOUTH EVERY DAY WITH BREAKFAST Patient taking differently: Take 325 mg by mouth daily with breakfast. 02/09/22  Yes Heilingoetter, Cassandra L, PA-C  hydrocortisone (ANUSOL-HC) 2.5 % rectal cream Place 1 Application rectally 2 (two) times daily. 06/24/23  Yes Si Gaul, MD  metoprolol tartrate (LOPRESSOR) 50 MG tablet Take 50 mg by mouth 2 (two) times daily. 03/11/22  Yes [provider]  mirtazapine (REMERON) 30 MG tablet Take 1 tablet (30 mg total) by mouth at bedtime. 06/24/23  Yes Si Gaul, MD  nitrofurantoin, macrocrystal-monohydrate, (MACROBID) 100 MG capsule Take 100 mg by mouth 2 (two) times daily. 04/13/23  Yes [provider]  oxyCODONE-acetaminophen (PERCOCET/ROXICET) 5-325 MG tablet Take 1 tablet by mouth every 8 (eight) hours as needed for severe pain (pain score 7-10). 06/24/23  Yes Mohamed,  to timing of contrast. Cholelithiasis. Severe atherosclerotic plaque. Musculoskeletal: No chest wall abnormality. Diffusely decreased bone density. Chronic appearing L1 vertebral body height loss. No suspicious lytic or blastic osseous lesions. No acute displaced fracture. Review of the MIP images confirms the above findings. IMPRESSION: 1. No pulmonary embolus. 2. Interval increase in size of a moderate right pleural effusion. 3. Interval worsening of moderate loculated irregular left pleural effusions. Irregular pleural effusions with likely underlying deposits appears to be invasive along the mediastinum. Markedly limited evaluation on this noncontrast study. 4. Passive atelectasis of the left lung with ground-glass airspace opacities of the remaining portion of aerated left lungs. 5. Stable partially calcified 1.3 x 0.9 cm right apical pulmonary nodule 6. Stable aneurysmal ascending thoracic aorta (4.2 cm.) Recommend annual imaging followup by CTA or MRA. This recommendation follows 2010 ACCF/AHA/AATS/ACR/ASA/SCA/SCAI/SIR/STS/SVM Guidelines for the Diagnosis and Management of Patients with Thoracic Aortic Disease. Circulation.  2010; 121: G295-M841. Aortic aneurysm NOS (ICD10-I71.9). 7.  Aortic Atherosclerosis (ICD10-I70.0). 8. Cholelithiasis. Electronically Signed   By: Tish Frederickson M.D.   On: 06/29/2023 20:49   DG Chest 2 View  Result Date: 06/29/2023 CLINICAL DATA:  Shortness of breath. Metastatic renal cell carcinoma EXAM: CHEST - 2 VIEW COMPARISON:  04/22/2023 FINDINGS: Stable heart size. Aortic atherosclerosis. Bilateral pleural effusions with prominent masslike left pleural thickening compatible with known metastatic disease. Patchy airspace opacities throughout the left lung. Milder interstitial opacities within the right lung. No pneumothorax. Chronic mild wedge compression deformity of L1. IMPRESSION: 1. Bilateral pleural effusions with prominent masslike left pleural thickening compatible with known metastatic disease. 2. Patchy airspace opacities throughout the left lung and milder interstitial opacities within the right lung, which may represent asymmetric edema or infection. Electronically Signed   By: Duanne Guess D.O.   On: 06/29/2023 19:37   VAS [place] LOWER EXTREMITY VENOUS (DVT) (ONLY [facility])  Result Date: 06/29/2023  Lower Venous DVT Study Patient Name:  Gail Fitzgerald  Date of Exam:   06/29/2023 Medical Rec #: [id number]         Accession #:    [id number] Date of Birth: March 07, 1935         Patient Gender: F Patient Age:   33 years Exam Location:  [facility] Procedure:      VAS [place] LOWER EXTREMITY VENOUS (DVT) Referring Phys: Nehemiah Settle SMALL --------------------------------------------------------------------------------  Indications: Swelling, and Edema. Other Indications: Patient positioning. Risk Factors: Cancer Hx past pregnancy. Limitations: Body habitus. Performing Technologist: Shona Simpson  Examination Guidelines: A complete evaluation includes B-mode imaging, spectral Doppler, color Doppler, and power Doppler as needed of all accessible portions of each vessel. Bilateral testing is considered  an integral part of a complete examination. Limited examinations for reoccurring indications may be performed as noted. The reflux portion of the exam is performed with the patient in reverse Trendelenburg.  +---------+---------------+---------+-----------+----------+-----------------+ RIGHT    CompressibilityPhasicitySpontaneityPropertiesThrombus Aging    +---------+---------------+---------+-----------+----------+-----------------+ CFV      Full           Yes      Yes                                    +---------+---------------+---------+-----------+----------+-----------------+ SFJ      Full                                                           +---------+---------------+---------+-----------+----------+-----------------+  History and Physical      Gail Fitzgerald EGB:[id number] DOB: 11/25/1934 DOA: 06/29/2023; DOS: 06/29/2023  PCP: [facility] *** Patient coming from: home ***  I have personally briefly reviewed patient's old medical records in [facility]  Chief Complaint: ***  HPI: Gail Fitzgerald is a 88 y.o. female with medical history significant for *** who is admitted to [facility] on 06/29/2023 with *** after presenting from home*** to [facility] ED complaining of ***.    ***       ***   ED Course:  Vital signs in the ED were notable for the following: ***  Labs were notable for the following: ***  Per my interpretation, EKG in ED demonstrated the following:  ***  Imaging in the ED, per corresponding formal radiology read, was notable for the following:  ***  While in the ED, the following were administered: ***  Subsequently, the patient was admitted  ***  ***red    Review of Systems: As per HPI otherwise 10 point review of systems negative.   Past Medical History:  Diagnosis Date   Hypertension     Past Surgical History:  Procedure Laterality Date   IR THORACENTESIS ASP PLEURAL SPACE W/IMG GUIDE  04/05/2021   THORACENTESIS N/A 04/12/2021   Procedure: Alanson Puls;  Surgeon: Josephine Igo, DO;  Location: [facility];  Service: Pulmonary;  Laterality: N/A;    Social History:  reports that she has never smoked. She has never used smokeless tobacco. She reports that she does not currently use alcohol. She reports that she does not currently use drugs.   No Known Allergies  Family History  Problem Relation Age of Onset   Diabetes Neg Hx    Kidney disease Neg Hx     Family history reviewed and not pertinent ***   Prior to Admission medications   Medication Sig Start Date End Date Taking? Authorizing Provider  acetaminophen (TYLENOL) 325 MG tablet Take 650 mg by mouth every 6 (six) hours as needed for headache.   Yes  [provider]  amLODipine (NORVASC) 10 MG tablet Take 5 mg by mouth daily. 09/14/21  Yes [provider]  benazepril (LOTENSIN) 20 MG tablet Take 20 mg by mouth daily. 06/11/21  Yes [provider]  cabozantinib (CABOMETYX) 20 MG tablet Take 1 tablet (20 mg total) by mouth daily. Take on an empty stomach, 1 hour before or 2 hours after meals. 06/16/23  Yes Si Gaul, MD  celecoxib (CELEBREX) 100 MG capsule Take 1 capsule (100 mg total) by mouth daily. 06/05/23  Yes Pickenpack-Cousar, Arty Baumgartner, NP  ferrous sulfate 325 (65 FE) MG EC tablet TAKE 1 TABLET BY MOUTH EVERY DAY WITH BREAKFAST Patient taking differently: Take 325 mg by mouth daily with breakfast. 02/09/22  Yes Heilingoetter, Cassandra L, PA-C  hydrocortisone (ANUSOL-HC) 2.5 % rectal cream Place 1 Application rectally 2 (two) times daily. 06/24/23  Yes Si Gaul, MD  metoprolol tartrate (LOPRESSOR) 50 MG tablet Take 50 mg by mouth 2 (two) times daily. 03/11/22  Yes [provider]  mirtazapine (REMERON) 30 MG tablet Take 1 tablet (30 mg total) by mouth at bedtime. 06/24/23  Yes Si Gaul, MD  nitrofurantoin, macrocrystal-monohydrate, (MACROBID) 100 MG capsule Take 100 mg by mouth 2 (two) times daily. 04/13/23  Yes [provider]  oxyCODONE-acetaminophen (PERCOCET/ROXICET) 5-325 MG tablet Take 1 tablet by mouth every 8 (eight) hours as needed for severe pain (pain score 7-10). 06/24/23  Yes Mohamed,  to timing of contrast. Cholelithiasis. Severe atherosclerotic plaque. Musculoskeletal: No chest wall abnormality. Diffusely decreased bone density. Chronic appearing L1 vertebral body height loss. No suspicious lytic or blastic osseous lesions. No acute displaced fracture. Review of the MIP images confirms the above findings. IMPRESSION: 1. No pulmonary embolus. 2. Interval increase in size of a moderate right pleural effusion. 3. Interval worsening of moderate loculated irregular left pleural effusions. Irregular pleural effusions with likely underlying deposits appears to be invasive along the mediastinum. Markedly limited evaluation on this noncontrast study. 4. Passive atelectasis of the left lung with ground-glass airspace opacities of the remaining portion of aerated left lungs. 5. Stable partially calcified 1.3 x 0.9 cm right apical pulmonary nodule 6. Stable aneurysmal ascending thoracic aorta (4.2 cm.) Recommend annual imaging followup by CTA or MRA. This recommendation follows 2010 ACCF/AHA/AATS/ACR/ASA/SCA/SCAI/SIR/STS/SVM Guidelines for the Diagnosis and Management of Patients with Thoracic Aortic Disease. Circulation.  2010; 121: G295-M841. Aortic aneurysm NOS (ICD10-I71.9). 7.  Aortic Atherosclerosis (ICD10-I70.0). 8. Cholelithiasis. Electronically Signed   By: Tish Frederickson M.D.   On: 06/29/2023 20:49   DG Chest 2 View  Result Date: 06/29/2023 CLINICAL DATA:  Shortness of breath. Metastatic renal cell carcinoma EXAM: CHEST - 2 VIEW COMPARISON:  04/22/2023 FINDINGS: Stable heart size. Aortic atherosclerosis. Bilateral pleural effusions with prominent masslike left pleural thickening compatible with known metastatic disease. Patchy airspace opacities throughout the left lung. Milder interstitial opacities within the right lung. No pneumothorax. Chronic mild wedge compression deformity of L1. IMPRESSION: 1. Bilateral pleural effusions with prominent masslike left pleural thickening compatible with known metastatic disease. 2. Patchy airspace opacities throughout the left lung and milder interstitial opacities within the right lung, which may represent asymmetric edema or infection. Electronically Signed   By: Duanne Guess D.O.   On: 06/29/2023 19:37   VAS [place] LOWER EXTREMITY VENOUS (DVT) (ONLY [facility])  Result Date: 06/29/2023  Lower Venous DVT Study Patient Name:  Gail Fitzgerald  Date of Exam:   06/29/2023 Medical Rec #: [id number]         Accession #:    [id number] Date of Birth: March 07, 1935         Patient Gender: F Patient Age:   33 years Exam Location:  [facility] Procedure:      VAS [place] LOWER EXTREMITY VENOUS (DVT) Referring Phys: Nehemiah Settle SMALL --------------------------------------------------------------------------------  Indications: Swelling, and Edema. Other Indications: Patient positioning. Risk Factors: Cancer Hx past pregnancy. Limitations: Body habitus. Performing Technologist: Shona Simpson  Examination Guidelines: A complete evaluation includes B-mode imaging, spectral Doppler, color Doppler, and power Doppler as needed of all accessible portions of each vessel. Bilateral testing is considered  an integral part of a complete examination. Limited examinations for reoccurring indications may be performed as noted. The reflux portion of the exam is performed with the patient in reverse Trendelenburg.  +---------+---------------+---------+-----------+----------+-----------------+ RIGHT    CompressibilityPhasicitySpontaneityPropertiesThrombus Aging    +---------+---------------+---------+-----------+----------+-----------------+ CFV      Full           Yes      Yes                                    +---------+---------------+---------+-----------+----------+-----------------+ SFJ      Full                                                           +---------+---------------+---------+-----------+----------+-----------------+  to timing of contrast. Cholelithiasis. Severe atherosclerotic plaque. Musculoskeletal: No chest wall abnormality. Diffusely decreased bone density. Chronic appearing L1 vertebral body height loss. No suspicious lytic or blastic osseous lesions. No acute displaced fracture. Review of the MIP images confirms the above findings. IMPRESSION: 1. No pulmonary embolus. 2. Interval increase in size of a moderate right pleural effusion. 3. Interval worsening of moderate loculated irregular left pleural effusions. Irregular pleural effusions with likely underlying deposits appears to be invasive along the mediastinum. Markedly limited evaluation on this noncontrast study. 4. Passive atelectasis of the left lung with ground-glass airspace opacities of the remaining portion of aerated left lungs. 5. Stable partially calcified 1.3 x 0.9 cm right apical pulmonary nodule 6. Stable aneurysmal ascending thoracic aorta (4.2 cm.) Recommend annual imaging followup by CTA or MRA. This recommendation follows 2010 ACCF/AHA/AATS/ACR/ASA/SCA/SCAI/SIR/STS/SVM Guidelines for the Diagnosis and Management of Patients with Thoracic Aortic Disease. Circulation.  2010; 121: G295-M841. Aortic aneurysm NOS (ICD10-I71.9). 7.  Aortic Atherosclerosis (ICD10-I70.0). 8. Cholelithiasis. Electronically Signed   By: Tish Frederickson M.D.   On: 06/29/2023 20:49   DG Chest 2 View  Result Date: 06/29/2023 CLINICAL DATA:  Shortness of breath. Metastatic renal cell carcinoma EXAM: CHEST - 2 VIEW COMPARISON:  04/22/2023 FINDINGS: Stable heart size. Aortic atherosclerosis. Bilateral pleural effusions with prominent masslike left pleural thickening compatible with known metastatic disease. Patchy airspace opacities throughout the left lung. Milder interstitial opacities within the right lung. No pneumothorax. Chronic mild wedge compression deformity of L1. IMPRESSION: 1. Bilateral pleural effusions with prominent masslike left pleural thickening compatible with known metastatic disease. 2. Patchy airspace opacities throughout the left lung and milder interstitial opacities within the right lung, which may represent asymmetric edema or infection. Electronically Signed   By: Duanne Guess D.O.   On: 06/29/2023 19:37   VAS [place] LOWER EXTREMITY VENOUS (DVT) (ONLY [facility])  Result Date: 06/29/2023  Lower Venous DVT Study Patient Name:  Gail Fitzgerald  Date of Exam:   06/29/2023 Medical Rec #: [id number]         Accession #:    [id number] Date of Birth: March 07, 1935         Patient Gender: F Patient Age:   33 years Exam Location:  [facility] Procedure:      VAS [place] LOWER EXTREMITY VENOUS (DVT) Referring Phys: Nehemiah Settle SMALL --------------------------------------------------------------------------------  Indications: Swelling, and Edema. Other Indications: Patient positioning. Risk Factors: Cancer Hx past pregnancy. Limitations: Body habitus. Performing Technologist: Shona Simpson  Examination Guidelines: A complete evaluation includes B-mode imaging, spectral Doppler, color Doppler, and power Doppler as needed of all accessible portions of each vessel. Bilateral testing is considered  an integral part of a complete examination. Limited examinations for reoccurring indications may be performed as noted. The reflux portion of the exam is performed with the patient in reverse Trendelenburg.  +---------+---------------+---------+-----------+----------+-----------------+ RIGHT    CompressibilityPhasicitySpontaneityPropertiesThrombus Aging    +---------+---------------+---------+-----------+----------+-----------------+ CFV      Full           Yes      Yes                                    +---------+---------------+---------+-----------+----------+-----------------+ SFJ      Full                                                           +---------+---------------+---------+-----------+----------+-----------------+  to timing of contrast. Cholelithiasis. Severe atherosclerotic plaque. Musculoskeletal: No chest wall abnormality. Diffusely decreased bone density. Chronic appearing L1 vertebral body height loss. No suspicious lytic or blastic osseous lesions. No acute displaced fracture. Review of the MIP images confirms the above findings. IMPRESSION: 1. No pulmonary embolus. 2. Interval increase in size of a moderate right pleural effusion. 3. Interval worsening of moderate loculated irregular left pleural effusions. Irregular pleural effusions with likely underlying deposits appears to be invasive along the mediastinum. Markedly limited evaluation on this noncontrast study. 4. Passive atelectasis of the left lung with ground-glass airspace opacities of the remaining portion of aerated left lungs. 5. Stable partially calcified 1.3 x 0.9 cm right apical pulmonary nodule 6. Stable aneurysmal ascending thoracic aorta (4.2 cm.) Recommend annual imaging followup by CTA or MRA. This recommendation follows 2010 ACCF/AHA/AATS/ACR/ASA/SCA/SCAI/SIR/STS/SVM Guidelines for the Diagnosis and Management of Patients with Thoracic Aortic Disease. Circulation.  2010; 121: G295-M841. Aortic aneurysm NOS (ICD10-I71.9). 7.  Aortic Atherosclerosis (ICD10-I70.0). 8. Cholelithiasis. Electronically Signed   By: Tish Frederickson M.D.   On: 06/29/2023 20:49   DG Chest 2 View  Result Date: 06/29/2023 CLINICAL DATA:  Shortness of breath. Metastatic renal cell carcinoma EXAM: CHEST - 2 VIEW COMPARISON:  04/22/2023 FINDINGS: Stable heart size. Aortic atherosclerosis. Bilateral pleural effusions with prominent masslike left pleural thickening compatible with known metastatic disease. Patchy airspace opacities throughout the left lung. Milder interstitial opacities within the right lung. No pneumothorax. Chronic mild wedge compression deformity of L1. IMPRESSION: 1. Bilateral pleural effusions with prominent masslike left pleural thickening compatible with known metastatic disease. 2. Patchy airspace opacities throughout the left lung and milder interstitial opacities within the right lung, which may represent asymmetric edema or infection. Electronically Signed   By: Duanne Guess D.O.   On: 06/29/2023 19:37   VAS [place] LOWER EXTREMITY VENOUS (DVT) (ONLY [facility])  Result Date: 06/29/2023  Lower Venous DVT Study Patient Name:  Gail Fitzgerald  Date of Exam:   06/29/2023 Medical Rec #: [id number]         Accession #:    [id number] Date of Birth: March 07, 1935         Patient Gender: F Patient Age:   33 years Exam Location:  [facility] Procedure:      VAS [place] LOWER EXTREMITY VENOUS (DVT) Referring Phys: Nehemiah Settle SMALL --------------------------------------------------------------------------------  Indications: Swelling, and Edema. Other Indications: Patient positioning. Risk Factors: Cancer Hx past pregnancy. Limitations: Body habitus. Performing Technologist: Shona Simpson  Examination Guidelines: A complete evaluation includes B-mode imaging, spectral Doppler, color Doppler, and power Doppler as needed of all accessible portions of each vessel. Bilateral testing is considered  an integral part of a complete examination. Limited examinations for reoccurring indications may be performed as noted. The reflux portion of the exam is performed with the patient in reverse Trendelenburg.  +---------+---------------+---------+-----------+----------+-----------------+ RIGHT    CompressibilityPhasicitySpontaneityPropertiesThrombus Aging    +---------+---------------+---------+-----------+----------+-----------------+ CFV      Full           Yes      Yes                                    +---------+---------------+---------+-----------+----------+-----------------+ SFJ      Full                                                           +---------+---------------+---------+-----------+----------+-----------------+

## 2023-06-30 ENCOUNTER — Inpatient Hospital Stay (HOSPITAL_COMMUNITY): Payer: Medicaid Other

## 2023-06-30 ENCOUNTER — Encounter (HOSPITAL_COMMUNITY): Payer: Self-pay | Admitting: Internal Medicine

## 2023-06-30 ENCOUNTER — Other Ambulatory Visit (HOSPITAL_COMMUNITY): Payer: Self-pay

## 2023-06-30 DIAGNOSIS — E871 Hypo-osmolality and hyponatremia: Secondary | ICD-10-CM | POA: Diagnosis not present

## 2023-06-30 DIAGNOSIS — I10 Essential (primary) hypertension: Secondary | ICD-10-CM | POA: Diagnosis present

## 2023-06-30 DIAGNOSIS — D638 Anemia in other chronic diseases classified elsewhere: Secondary | ICD-10-CM | POA: Insufficient documentation

## 2023-06-30 DIAGNOSIS — I5033 Acute on chronic diastolic (congestive) heart failure: Secondary | ICD-10-CM

## 2023-06-30 DIAGNOSIS — I82451 Acute embolism and thrombosis of right peroneal vein: Secondary | ICD-10-CM | POA: Diagnosis not present

## 2023-06-30 DIAGNOSIS — C642 Malignant neoplasm of left kidney, except renal pelvis: Secondary | ICD-10-CM

## 2023-06-30 DIAGNOSIS — I82401 Acute embolism and thrombosis of unspecified deep veins of right lower extremity: Secondary | ICD-10-CM | POA: Diagnosis present

## 2023-06-30 HISTORY — PX: IR THORACENTESIS ASP PLEURAL SPACE W/IMG GUIDE: IMG5380

## 2023-06-30 LAB — BODY FLUID CELL COUNT WITH DIFFERENTIAL
Eos, Fluid: 0 %
Lymphs, Fluid: 4 %
Monocyte-Macrophage-Serous Fluid: 95 % — ABNORMAL HIGH (ref 50–90)
Neutrophil Count, Fluid: 1 % (ref 0–25)
Total Nucleated Cell Count, Fluid: UNDETERMINED uL (ref 0–1000)

## 2023-06-30 LAB — AMYLASE, PLEURAL OR PERITONEAL FLUID: Amylase, Fluid: 54 U/L

## 2023-06-30 LAB — CBC WITH DIFFERENTIAL/PLATELET
Abs Immature Granulocytes: 0.04 10*3/uL (ref 0.00–0.07)
Basophils Absolute: 0 10*3/uL (ref 0.0–0.1)
Basophils Relative: 0 %
Eosinophils Absolute: 0 10*3/uL (ref 0.0–0.5)
Eosinophils Relative: 0 %
HCT: 32.3 % — ABNORMAL LOW (ref 36.0–46.0)
Hemoglobin: 10.6 g/dL — ABNORMAL LOW (ref 12.0–15.0)
Immature Granulocytes: 1 %
Lymphocytes Relative: 9 %
Lymphs Abs: 0.6 10*3/uL — ABNORMAL LOW (ref 0.7–4.0)
MCH: 28.3 pg (ref 26.0–34.0)
MCHC: 32.8 g/dL (ref 30.0–36.0)
MCV: 86.4 fL (ref 80.0–100.0)
Monocytes Absolute: 0.8 10*3/uL (ref 0.1–1.0)
Monocytes Relative: 12 %
Neutro Abs: 5.1 10*3/uL (ref 1.7–7.7)
Neutrophils Relative %: 78 %
Platelets: 287 10*3/uL (ref 150–400)
RBC: 3.74 MIL/uL — ABNORMAL LOW (ref 3.87–5.11)
RDW: 14.3 % (ref 11.5–15.5)
WBC: 6.5 10*3/uL (ref 4.0–10.5)
nRBC: 0 % (ref 0.0–0.2)

## 2023-06-30 LAB — ECHOCARDIOGRAM COMPLETE
AR max vel: 2.91 cm2
AV Area VTI: 2.94 cm2
AV Area mean vel: 2.69 cm2
AV Mean grad: 3 mm[Hg]
AV Peak grad: 4.8 mm[Hg]
Ao pk vel: 1.1 m/s
Area-P 1/2: 3.27 cm2
Est EF: 55
Height: 61 in
P 1/2 time: 507 ms
S' Lateral: 2.9 cm
Weight: 1568.02 [oz_av]

## 2023-06-30 LAB — URINALYSIS, COMPLETE (UACMP) WITH MICROSCOPIC
Bacteria, UA: NONE SEEN
Bilirubin Urine: NEGATIVE
Glucose, UA: NEGATIVE mg/dL
Hgb urine dipstick: NEGATIVE
Ketones, ur: NEGATIVE mg/dL
Leukocytes,Ua: NEGATIVE
Nitrite: NEGATIVE
Protein, ur: NEGATIVE mg/dL
Specific Gravity, Urine: 1.017 (ref 1.005–1.030)
pH: 6 (ref 5.0–8.0)

## 2023-06-30 LAB — PROCALCITONIN: Procalcitonin: <0.1 ng/mL

## 2023-06-30 LAB — GLUCOSE, PLEURAL OR PERITONEAL FLUID: Glucose, Fluid: <20 mg/dL

## 2023-06-30 LAB — COMPREHENSIVE METABOLIC PANEL
ALT: 8 U/L (ref 0–44)
AST: 16 U/L (ref 15–41)
Albumin: 2 g/dL — ABNORMAL LOW (ref 3.5–5.0)
Alkaline Phosphatase: 75 U/L (ref 38–126)
Anion gap: 14 (ref 5–15)
BUN: 9 mg/dL (ref 8–23)
CO2: 28 mmol/L (ref 22–32)
Calcium: 8 mg/dL — ABNORMAL LOW (ref 8.9–10.3)
Chloride: 80 mmol/L — ABNORMAL LOW (ref 98–111)
Creatinine, Ser: 0.4 mg/dL — ABNORMAL LOW (ref 0.44–1.00)
GFR, Estimated: >60 mL/min (ref >60)
Glucose, Bld: 86 mg/dL (ref 70–99)
Potassium: 3.7 mmol/L (ref 3.5–5.1)
Sodium: 122 mmol/L — ABNORMAL LOW (ref 135–145)
Total Bilirubin: 0.8 mg/dL (ref 0.3–1.2)
Total Protein: 5.3 g/dL — ABNORMAL LOW (ref 6.5–8.1)

## 2023-06-30 LAB — PHOSPHORUS: Phosphorus: 3.2 mg/dL (ref 2.5–4.6)

## 2023-06-30 LAB — PROTEIN, PLEURAL OR PERITONEAL FLUID: Total protein, fluid: 3.2 g/dL

## 2023-06-30 LAB — OSMOLALITY, URINE: Osmolality, Ur: 350 mosm/kg (ref 300–900)

## 2023-06-30 LAB — LACTATE DEHYDROGENASE, PLEURAL OR PERITONEAL FLUID: LD, Fluid: 521 U/L — ABNORMAL HIGH (ref 3–23)

## 2023-06-30 LAB — MAGNESIUM: Magnesium: 1.7 mg/dL (ref 1.7–2.4)

## 2023-06-30 LAB — CREATININE, URINE, RANDOM: Creatinine, Urine: 19 mg/dL

## 2023-06-30 LAB — OSMOLALITY: Osmolality: 259 mosm/kg — ABNORMAL LOW (ref 275–295)

## 2023-06-30 LAB — LACTATE DEHYDROGENASE: LDH: 129 U/L (ref 98–192)

## 2023-06-30 LAB — SODIUM, URINE, RANDOM: Sodium, Ur: 70 mmol/L

## 2023-06-30 MED ORDER — LIDOCAINE HCL 1 % IJ SOLN
INTRAMUSCULAR | Status: AC
  Filled 2023-06-30: qty 20

## 2023-06-30 MED ORDER — ENSURE ENLIVE PO LIQD
237.0000 mL | Freq: Two times a day (BID) | ORAL | Status: DC
  Administered 2023-06-30 – 2023-07-02 (×4): 237 mL via ORAL

## 2023-06-30 MED ORDER — BENAZEPRIL HCL 20 MG PO TABS
20.0000 mg | ORAL_TABLET | Freq: Every day | ORAL | Status: DC
  Administered 2023-06-30 – 2023-07-03 (×4): 20 mg via ORAL
  Filled 2023-06-30 (×4): qty 1

## 2023-06-30 MED ORDER — PANTOPRAZOLE SODIUM 40 MG PO TBEC
40.0000 mg | DELAYED_RELEASE_TABLET | Freq: Every day | ORAL | Status: DC
  Administered 2023-06-30 – 2023-07-03 (×4): 40 mg via ORAL
  Filled 2023-06-30 (×4): qty 1

## 2023-06-30 MED ORDER — SPIRONOLACTONE 12.5 MG HALF TABLET
12.5000 mg | ORAL_TABLET | Freq: Every day | ORAL | Status: DC
  Administered 2023-06-30 – 2023-07-03 (×4): 12.5 mg via ORAL
  Filled 2023-06-30 (×4): qty 1

## 2023-06-30 MED ORDER — EMPAGLIFLOZIN 10 MG PO TABS
10.0000 mg | ORAL_TABLET | Freq: Every day | ORAL | Status: DC
  Administered 2023-06-30 – 2023-07-03 (×4): 10 mg via ORAL
  Filled 2023-06-30 (×4): qty 1

## 2023-06-30 MED ORDER — METOPROLOL TARTRATE 50 MG PO TABS
50.0000 mg | ORAL_TABLET | Freq: Two times a day (BID) | ORAL | Status: DC
  Administered 2023-06-30 – 2023-07-03 (×7): 50 mg via ORAL
  Filled 2023-06-30 (×7): qty 1

## 2023-06-30 MED ORDER — POTASSIUM CHLORIDE CRYS ER 20 MEQ PO TBCR
40.0000 meq | EXTENDED_RELEASE_TABLET | Freq: Once | ORAL | Status: AC
  Administered 2023-06-30: 40 meq via ORAL
  Filled 2023-06-30: qty 2

## 2023-06-30 MED ORDER — FUROSEMIDE 10 MG/ML IJ SOLN
40.0000 mg | Freq: Two times a day (BID) | INTRAMUSCULAR | Status: DC
  Administered 2023-06-30 – 2023-07-03 (×6): 40 mg via INTRAVENOUS
  Filled 2023-06-30 (×6): qty 4

## 2023-06-30 MED ORDER — FERROUS SULFATE 325 (65 FE) MG PO TABS
325.0000 mg | ORAL_TABLET | Freq: Every day | ORAL | Status: DC
  Administered 2023-06-30 – 2023-07-03 (×4): 325 mg via ORAL
  Filled 2023-06-30 (×5): qty 1

## 2023-06-30 MED ORDER — MAGNESIUM SULFATE 4 GM/100ML IV SOLN
4.0000 g | Freq: Once | INTRAVENOUS | Status: AC
  Administered 2023-06-30: 4 g via INTRAVENOUS
  Filled 2023-06-30: qty 100

## 2023-06-30 MED ORDER — LIDOCAINE HCL 1 % IJ SOLN
20.0000 mL | Freq: Once | INTRAMUSCULAR | Status: AC
  Administered 2023-06-30: 10 mL via INTRADERMAL
  Filled 2023-06-30: qty 20

## 2023-06-30 NOTE — Assessment & Plan Note (Addendum)
Echocardiogram with preserved LV systolic function with EF 55%, no LVH, RV systolic function preserved, RVSP 25,8, LA with mild dilatation, moderate aortic valve regurgitation, trivial pericardial effusion.   Urine output not documented.  Systolic blood pressure 137 to 142 mmHg.   Plan to continue diuresis with furosemide 40 mg IV bid.  Add spironolactone and SGLT 2 inh to augment diuresis.   Acute hypoxemic respiratory failure due to bilateral pleural effusions and acute cardiogenic pulmonary edema.  In the setting of metastatic left lung disease.  Continue diuresis and oxymetry monitoring. Supplemental 02 per [place] to keep 02 saturation 92% or greater.

## 2023-06-30 NOTE — Assessment & Plan Note (Signed)
Continue anticoagulation with apixaban.  ?

## 2023-06-30 NOTE — Hospital Course (Signed)
Gail Fitzgerald was admitted to the hospital with the working diagnosis of decompensated heart failure.   88 yo female with the past medical history of renal cell carcinoma with lung metastatic disease, hypertension, chronic anemia, chronic hyponatremia and heart failure who presented with dyspnea. Reported 3 to 4 days of worsening dyspnea, associated with orthopnea and cough. 2 weeks of worsening lower extremity edema. On her initial physical examination her blood pressure was 152/108, HR 79, RR 18 and 02 saturation 95%, lungs with decreased breath sounds bilaterally, with no wheezing or rhonchi, heart with S1 and S2 present and regular, abdomen with no distention and positive bilateral lower extremity edema.   Na 121, K 3,3 CL 81 bicarbonate 27, glucose 96 bun 12 cr 0,50 Mg 1,4  BNP 509  High sensitive troponin 9 and 12  Wbc 8,4 hgb 10,9  plt 322   Chest radiograph with hyperinflation, bilateral pleural effusions, more right than left, left upper lobe subpleural opacity. Left lower lobe interstitial infiltrate.   CT chest with no pulmonary embolism, increased size of moderate right pleural effusion, worsening moderate loculated irregular left pleural effusions. Irregular pleural effusions with likely underlying deposits appears to be invasive along the mediastinum.  Passive atelectasis of the left lung with ground  glass airspace opacities.  Stable partially calcified 1.3 x 0,9 cm right apical pulmonary nodule.  Stable aneurysmal dilatation of the ascending aorta 4,2 cm.   EKG 72 bpm, left axis deviation, normal intervals, sinus rhythm with no significant ST segment or T wave changes.   [place] lower extremities with age indeterminate deep vein thrombosis involving the right peroneal veins. No common femoral vein obstruction on the left.   10/21 right thoracentesis 600 cc fluids removed.  10/22 echocardiogram with preserved LV systolic function, improving volume status.

## 2023-06-30 NOTE — Assessment & Plan Note (Addendum)
Metastatic renal cell carcinoma, presented with large left renal mass and left hemithorax pleural bases metastasis as well as left hilar and mediastinal lymphadenopathies.   Had disease progression despite chemotherapy.  Currently on chemotherapy, nivolumab and cabometyx.   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 Iron deficiency anemia. Continue iron supplementation.

## 2023-06-30 NOTE — Progress Notes (Signed)
Heart Failure Navigator Progress Note  Assessed for Heart & Vascular TOC clinic readiness.  Patient does not meet criteria due to patient with metastatic cancer..   Navigator will sign off at this time.  Roxy Horseman, RN, BSN [facility] Heart Failure Navigator Secure Chat Only

## 2023-06-30 NOTE — TOC Initial Note (Signed)
Transition of Care [facility]) - Initial/Assessment Note    Patient Details  Name: Gail Fitzgerald MRN: [id number] Date of Birth: August 13, 1935  [facility]) CM/SW Contact:    Leone Haven, RN Phone Number: 06/30/2023, 6:49 PM  Clinical Narrative:                 From home with son and DIL,  has PCP and insurance on file, states has no HH services in place at this time, but she has PCS services from 10:30 to 2 pm , she has a walker and a w/chair at home.  DIL at bedside and states she will transport her home at dc and family is support system, states [facility] [address].  Pta self ambulatory with walker.    Expected Discharge Plan: Home/Self Care Barriers to Discharge: Continued Medical Work up   Patient Goals and CMS Choice Patient states their goals for this hospitalization and ongoing recovery are:: return home with DIL   Choice offered to / list presented to : NA      Expected Discharge Plan and Services In-house Referral: NA Discharge Planning Services: CM Consult Post Acute Care Choice: NA Living arrangements for the past 2 months: Single Family Home                 DME Arranged: N/A DME Agency: NA       HH Arranged: NA          Prior Living Arrangements/Services Living arrangements for the past 2 months: Single Family Home Lives with:: Adult Children Patient language and need for interpreter reviewed:: Yes Do you feel safe going back to the place where you live?: Yes      Need for Family Participation in Patient Care: Yes (Comment) Care giver support system in place?: Yes (comment) Current home services: DME (walker, w/chair,) Criminal Activity/Legal Involvement Pertinent to Current Situation/Hospitalization: No - Comment as needed  Activities of Daily Living   ADL Screening (condition at time of admission) Independently performs ADLs?: No Does the patient have a NEW difficulty with  bathing/dressing/toileting/self-feeding that is expected to last >3 days?: Yes (Initiates electronic notice to provider for possible OT consult) Does the patient have a NEW difficulty with getting in/out of bed, walking, or climbing stairs that is expected to last >3 days?: Yes (Initiates electronic notice to provider for possible PT consult) Does the patient have a NEW difficulty with communication that is expected to last >3 days?: No Is the patient deaf or have difficulty hearing?: No Does the patient have difficulty seeing, even when wearing glasses/contacts?: No Does the patient have difficulty concentrating, remembering, or making decisions?: No  Permission Sought/Granted Permission sought to share information with : Case Manager Permission granted to share information with : Yes, Verbal Permission Granted              Emotional Assessment Appearance:: Appears stated age     Orientation: : Oriented to Self, Oriented to Place, Oriented to  Time, Oriented to Situation Alcohol / Substance Use: Not Applicable Psych Involvement: No (comment)  Admission diagnosis:  Acute diastolic heart failure (HCC) [I50.31] Hyponatremia [E87.1] Peripheral edema [R60.0] Pleural effusion [J90] Patient Active Problem List   Diagnosis Date Noted   Acute deep vein thrombosis (DVT) of right lower extremity (HCC) 06/30/2023   Hyponatremia 06/30/2023   Hypomagnesemia 06/30/2023   Essential hypertension 06/30/2023   Anemia of chronic disease 06/30/2023   Acute on chronic diastolic CHF (congestive heart  failure) (HCC) 06/29/2023   Mouth sores 04/01/2023   Renal cell carcinoma, left (HCC) 05/09/2021   Encounter for antineoplastic immunotherapy 05/09/2021   S/P thoracentesis    Chronic bilateral pleural effusions 03/10/2021   PCP:  [facility]:   [facility] [phone] , [place] - [address] AT [address] [facility] [address] [place] [address] Phone: [phone] Fax: [phone]  [facility] [address] [place] [address] Phone: [phone] Fax: [phone]     Social Determinants of Health (SDOH) Social History: SDOH Screenings   Food Insecurity: No Food Insecurity (06/29/2023)  Housing: Low Risk  (06/29/2023)  Transportation Needs: No Transportation Needs (06/29/2023)  Utilities: Not At Risk (06/29/2023)  Tobacco Use: Low Risk  (06/30/2023)   SDOH Interventions:     Readmission Risk Interventions     No data to display

## 2023-06-30 NOTE — Assessment & Plan Note (Addendum)
Continue close blood pressure monitoring  Continue diuresis with furosemide, benazepril and metoprolol.

## 2023-06-30 NOTE — Progress Notes (Signed)
Progress Note   Patient: Gail Fitzgerald ONG:[id number] DOB: 12-Nov-1934 DOA: 06/29/2023     1 DOS: the patient was seen and examined on 06/30/2023   Brief hospital course: Mrs. Shaik was admitted to the hospital with the working diagnosis of decompensated heart failure.   88 yo female with the past medical history of renal cell carcinoma with lung metastatic disease, hypertension, chronic anemia, chronic hyponatremia and heart failure who presented with dyspnea. Reported 3 to 4 days of worsening dyspnea, associated with orthopnea and cough. 2 weeks of worsening lower extremity edema. On her initial physical examination her blood pressure was 152/108, HR 79, RR 18 and 02 saturation 95%, lungs with decreased breath sounds bilaterally, with no wheezing or rhonchi, heart with S1 and S2 present and regular, abdomen with no distention and positive bilateral lower extremity edema.   Na 121, K 3,3 CL 81 bicarbonate 27, glucose 96 bun 12 cr 0,50 Mg 1,4  BNP 509  High sensitive troponin 9 and 12  Wbc 8,4 hgb 10,9  plt 322   Chest radiograph with hyperinflation, bilateral pleural effusions, more right than left, left upper lobe subpleural opacity. Left lower lobe interstitial infiltrate.   CT chest with no pulmonary embolism, increased size of moderate right pleural effusion, worsening moderate loculated irregular left pleural effusions. Irregular pleural effusions with likely underlying deposits appears to be invasive along the mediastinum.  Passive atelectasis of the left lung with ground  glass airspace opacities.  Stable partially calcified 1.3 x 0,9 cm right apical pulmonary nodule.  Stable aneurysmal dilatation of the ascending aorta 4,2 cm.   EKG 72 bpm, left axis deviation, normal intervals, sinus rhythm with no significant ST segment or T wave changes.   [place] lower extremities with age indeterminate deep vein thrombosis involving the right peroneal veins. No common femoral vein  obstruction on the left.   10/21 right thoracentesis 600 cc fluids removed.    Assessment and Plan: * Acute on chronic diastolic CHF (congestive heart failure) (HCC) Echocardiogram with preserved LV systolic function with EF 55%, no LVH, RV systolic function preserved, RVSP 25,8, LA with mild dilatation, moderate aortic valve regurgitation, trivial pericardial effusion.   Urine output not documented.  Systolic blood pressure 137 to 142 mmHg.   Plan to continue diuresis with furosemide 40 mg IV bid.  Add spironolactone and SGLT 2 inh to augment diuresis.   Acute hypoxemic respiratory failure due to bilateral pleural effusions and acute cardiogenic pulmonary edema.  In the setting of metastatic left lung disease.  Continue diuresis and oxymetry monitoring. Supplemental 02 per [place] to keep 02 saturation 92% or greater.   Acute deep vein thrombosis (DVT) of right lower extremity (HCC) Continue anticoagulation with apixaban.    Hyponatremia Hypomagnesemia. SIADH   Renal function with serum cr at 0,40 with K at 3,7 and serum bicarbonate at 28. Na 122 and Mg 1,7  Continue diuresis with furosemide, will add Mag sulfate 4 g today and 40 meq Kcl to prevent electrolyte abnormalities.    Essential hypertension Continue close blood pressure monitoring  Continue diuresis with furosemide, benazepril and metoprolol.   Renal cell carcinoma, left (HCC) Metastatic renal cell carcinoma, presented with large left renal mass and left hemithorax pleural bases metastasis as well as left hilar and mediastinal lymphadenopathies.   Had disease progression despite chemotherapy.  Currently on chemotherapy, nivolumab and cabometyx.   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 Iron deficiency anemia. Continue iron supplementation.  Subjective: Patient with improvement in dyspnea post thoracentesis, not yet back to baseline   Physical Exam: Vitals:   06/30/23 1205 06/30/23 1209  06/30/23 1214 06/30/23 1234  BP: (!) 142/63 (!) 145/61 (!) 140/54 (!) 137/59  Pulse:    66  Resp:    16  Temp:    98.7 F (37.1 C)  TempSrc:    Oral  SpO2:    95%  Weight:      Height:       Neurology awake and alert ENT with positive pallor with no icterus Cardiovascular with S1 and S2 present and regular with no gallops, rubs or murmurs Mild JVD Positive lower extremity edema pitting +++ more right than left Lungs with rales bilaterally at bases with no wheezing or rhonchi  Abdomen with no distention  Data Reviewed:    Family Communication: I spoke with patient's daughter in law at the bedside, we talked in detail about patient's condition, plan of care and prognosis and all questions were addressed. Daughter in law did translation    Disposition: Status is: Inpatient Remains inpatient appropriate because: diuresis   Planned Discharge Destination: Home     Author: Coralie Keens, MD 06/30/2023 3:10 PM  For on call review [email or web address].

## 2023-06-30 NOTE — Procedures (Signed)
PROCEDURE SUMMARY:  Successful US guided right thoracentesis. Yielded 600 mL of yellow fluid. Pt tolerated procedure well. No immediate complications.  Specimen was sent for labs. CXR ordered.  EBL < 5 mL  Hoyt Koch PA-C 06/30/2023 12:24 PM

## 2023-06-30 NOTE — Assessment & Plan Note (Addendum)
Hypomagnesemia. SIADH   Volume status has improved, renal function with serum cr at 0,52 with K at 4,2 and serum bicarbonate at 28. Na 120 Mg 2.1   Continue diuresis with furosemide, spironolactone and SGLT 2 inh.  Follow up renal function and electrolytes in am.

## 2023-07-01 DIAGNOSIS — I5033 Acute on chronic diastolic (congestive) heart failure: Secondary | ICD-10-CM | POA: Diagnosis not present

## 2023-07-01 DIAGNOSIS — I10 Essential (primary) hypertension: Secondary | ICD-10-CM | POA: Diagnosis not present

## 2023-07-01 DIAGNOSIS — E871 Hypo-osmolality and hyponatremia: Secondary | ICD-10-CM | POA: Diagnosis not present

## 2023-07-01 DIAGNOSIS — I82451 Acute embolism and thrombosis of right peroneal vein: Secondary | ICD-10-CM | POA: Diagnosis not present

## 2023-07-01 LAB — BASIC METABOLIC PANEL
Anion gap: 12 (ref 5–15)
BUN: 10 mg/dL (ref 8–23)
CO2: 28 mmol/L (ref 22–32)
Calcium: 7.7 mg/dL — ABNORMAL LOW (ref 8.9–10.3)
Chloride: 80 mmol/L — ABNORMAL LOW (ref 98–111)
Creatinine, Ser: 0.52 mg/dL (ref 0.44–1.00)
GFR, Estimated: >60 mL/min (ref >60)
Glucose, Bld: 79 mg/dL (ref 70–99)
Potassium: 4.2 mmol/L (ref 3.5–5.1)
Sodium: 120 mmol/L — ABNORMAL LOW (ref 135–145)

## 2023-07-01 LAB — MAGNESIUM: Magnesium: 2.1 mg/dL (ref 1.7–2.4)

## 2023-07-01 LAB — CYTOLOGY - NON PAP

## 2023-07-01 LAB — PH, BODY FLUID: pH, Body Fluid: 7.3

## 2023-07-01 LAB — TRIGLYCERIDES, BODY FLUIDS: Triglycerides, Fluid: 10 mg/dL

## 2023-07-01 MED ORDER — MIRTAZAPINE 15 MG PO TABS
30.0000 mg | ORAL_TABLET | Freq: Every day | ORAL | Status: DC
  Administered 2023-07-01 – 2023-07-02 (×2): 30 mg via ORAL
  Filled 2023-07-01 (×2): qty 2

## 2023-07-01 MED ORDER — CABOZANTINIB S-MALATE 20 MG PO TABS
20.0000 mg | ORAL_TABLET | Freq: Every day | ORAL | Status: DC
  Administered 2023-07-02 – 2023-07-03 (×2): 20 mg via ORAL
  Filled 2023-07-01 (×6): qty 1

## 2023-07-01 NOTE — Progress Notes (Signed)
Patient's home medication obtained and sent down to be stored and dispensed from inpatient pharmacy.

## 2023-07-01 NOTE — Progress Notes (Signed)
Initial Nutrition Assessment  DOCUMENTATION CODES:   Underweight  INTERVENTION:  Family to assist with meal ordering Continue regular diet as ordered Ensure Enlive po BID, each supplement provides 350 kcal and 20 grams of protein. Magic cup TID with meals, each supplement provides 290 kcal and 9 grams of protein  NUTRITION DIAGNOSIS:   Inadequate oral intake related to acute illness as evidenced by per patient/family report, meal completion < 50%.  GOAL:   Patient will meet greater than or equal to 90% of their needs  MONITOR:   PO intake, Labs, Weight trends  REASON FOR ASSESSMENT:   Consult Assessment of nutrition requirement/status  ASSESSMENT:   Pt admitted with SOB r/t acute on chronic diastolic HF. PMH significant for RCC with metastatic disease to the lung, hypertension, anemia of chronic disease, chronic diastolic heart failure, chronic hyponatremia.  10/21: s/p thoracentesis yield  Patient resting soundly at time of visit. Spoke with pt's son at bedside. He reports that she has been on an appetite stimulant. She had been eating well up until about the last week. She has c/o decreased appetite and food not tasting great. They have been providing homemade protein shakes and mashed potatoes within this time. She has difficulty chewing d/t irritation from dentures rubbing against gums. They have been able to order soft textured foods she can tolerate well.   Despite difficulty with dentition, she does not have swallowing difficulties. He reports that after sleeping, she will have episodes of coughing that may lead to emesis d/t secretions that have settled.   Pt's son mentions that since dinner last night, her intake has begun to improve. This morning she consumed about 40% of eggs and 40% of yogurt. She consumed half an Ensure last night and half this morning.   Meal completions: 10/21: 15% breakfast, 25% lunch, 75% dinner  Pt notably followed by RD at the  [facility]. Encouraged continued follow up once discharged.   Pt's son mentions that her weight has been trending down but is uncertain how much. Per review of weights, pt's weight noted to have gradually declined from 45.3 kg down to 42.2 kg in August. Since that time her weight has fluctuated between 42-45 kg. Weight changes in part could be related to pleural effusion noted on admission.   Medications: jardiance, ferrous sulfate, lasix 40mg  BID, protonix  Labs: sodium 120  NUTRITION - FOCUSED PHYSICAL EXAM: Pt resting soundly, deferred to follow up per discussion with pt's son.   Diet Order:   Diet Order             Diet regular Room service appropriate? Yes; Fluid consistency: Thin; Fluid restriction: 1200 mL Fluid  Diet effective now                   EDUCATION NEEDS:   Education needs have been addressed  Skin:  Skin Assessment: Reviewed RN Assessment  Last BM:  10/19  Height:   Ht Readings from Last 1 Encounters:  06/29/23 5\' 1"  (1.549 m)    Weight:   Wt Readings from Last 1 Encounters:  07/01/23 43.4 kg   BMI:  Body mass index is 18.08 kg/m.  Estimated Nutritional Needs:   Kcal:  1300-1500  Protein:  65-80g  Fluid:  1.3-1.5L  Drusilla Kanner, RDN, LDN Clinical Nutrition

## 2023-07-01 NOTE — Evaluation (Signed)
Physical Therapy Evaluation Patient Details Name: Gail Fitzgerald MRN: [id number] DOB: [birth year] Today's Date: 07/01/2023  History of Present Illness  88 yo female presents to [facility] on 10/20 with LE swelling, SOB due to HF exacerbation. Pt with age indeterminate DVT RLE, HF exacerbation with bilat pleural effusions, hyponatremia. S/p R thoracentesis 10/21 yielding 600 mL. PMH includes RCC with lung mets currently on chemo, HTN, anemia, HF.  Clinical Impression   Pt presents with generalized weakness, impaired balance with history of falls, impaired activity tolerance. Pt to benefit from acute PT to address deficits. Pt ambulated hallway distance with use of RW and supervision for safety, requires some assist for in and out of bed. AT baseline pt is very independent, recommending HHPT to address deficits post-acutely. PT to progress mobility as tolerated, and will continue to follow acutely.          If plan is discharge home, recommend the following: A little help with walking and/or transfers;A little help with bathing/dressing/bathroom   Can travel by private vehicle        Equipment Recommendations None recommended by PT  Recommendations for Other Services       Functional Status Assessment Patient has had a recent decline in their functional status and demonstrates the ability to make significant improvements in function in a reasonable and predictable amount of time.     Precautions / Restrictions Precautions Precautions: Fall Restrictions Weight Bearing Restrictions: No      Mobility  Bed Mobility Overal bed mobility: Needs Assistance Bed Mobility: Supine to Sit, Sit to Supine     Supine to sit: Supervision Sit to supine: Min assist   General bed mobility comments: assist for lifting LEs into bed    Transfers Overall transfer level: Needs assistance Equipment used: Rolling walker (2 wheels) Transfers: Sit to/from Stand Sit to Stand: Supervision            General transfer comment: for safety, cues for hand placement when moving stand>sit    Ambulation/Gait Ambulation/Gait assistance: Supervision Gait Distance (Feet): 100 Feet Assistive device: Rolling walker (2 wheels) Gait Pattern/deviations: Step-through pattern, Decreased stride length, Trunk flexed Gait velocity: decr     General Gait Details: supervision for safety, cues for upright posture and monitoring self for fatigue. SPO14min on RA 88%, recovered with rest  Stairs            Wheelchair Mobility     Tilt Bed    Modified Rankin (Stroke Patients Only)       Balance Overall balance assessment: Needs assistance Sitting-balance support: No upper extremity supported, Feet supported Sitting balance-Leahy Scale: Good     Standing balance support: During functional activity, Bilateral upper extremity supported Standing balance-Leahy Scale: Fair                               Pertinent Vitals/Pain Pain Assessment Pain Assessment: Faces Faces Pain Scale: No hurt Pain Intervention(s): Monitored during session    Home Living Family/patient expects to be discharged to:: Private residence Living Arrangements: Children;Other relatives (son and daughter in law) Available Help at Discharge: Family;Personal care attendant Type of Home: House         Home Layout: Two level;Able to live on main level with bedroom/bathroom Home Equipment: Wheelchair - Forensic psychologist (2 wheels);Shower seat Additional Comments: caregiver 1030AM-2PM "home sweet home" agency - helps her wash clothes, meal prep/cook but can help with lower level ADLs. Pt's daughter at  bedside states pt is "very independent"    Prior Function Prior Level of Function : Independent/Modified Independent             Mobility Comments: use of walker for mobilty in the home. caregiver assists with walks outside of the home ADLs Comments: Modified Independent with ADLs, can make herself  breakfast daily and makes up her bed. family and caregiver provide IADL support as needed     Extremity/Trunk Assessment   Upper Extremity Assessment Upper Extremity Assessment: Defer to OT evaluation    Lower Extremity Assessment Lower Extremity Assessment: Generalized weakness    Cervical / Trunk Assessment Cervical / Trunk Assessment: Kyphotic  Communication   Communication Communication: No apparent difficulties;Other (comment) (Pt speaks russian, pt's daughter-in-law at bedside requesting to intepret during session)  Cognition Arousal: Alert Behavior During Therapy: WFL for tasks assessed/performed Overall Cognitive Status: Within Functional Limits for tasks assessed                                          General Comments      Exercises     Assessment/Plan    PT Assessment Patient needs continued PT services  PT Problem List Decreased strength;Decreased mobility;Decreased safety awareness;Decreased activity tolerance;Decreased balance;Decreased knowledge of use of DME;Cardiopulmonary status limiting activity       PT Treatment Interventions DME instruction;Therapeutic exercise;Gait training;Therapeutic activities;Patient/family education;Balance training;Stair training;Functional mobility training;Neuromuscular re-education    PT Goals (Current goals can be found in the Care Plan section)  Acute Rehab PT Goals Patient Stated Goal: home PT Goal Formulation: With patient Time For Goal Achievement: 07/15/23 Potential to Achieve Goals: Good    Frequency Min 1X/week     Co-evaluation               AM-PAC PT "6 Clicks" Mobility  Outcome Measure Help needed turning from your back to your side while in a flat bed without using bedrails?: A Little Help needed moving from lying on your back to sitting on the side of a flat bed without using bedrails?: A Little Help needed moving to and from a bed to a chair (including a wheelchair)?: A  Little Help needed standing up from a chair using your arms (e.g., wheelchair or bedside chair)?: A Little Help needed to walk in hospital room?: A Little Help needed climbing 3-5 steps with a railing? : A Lot 6 Click Score: 17    End of Session   Activity Tolerance: Patient tolerated treatment well Patient left: in bed;with call bell/phone within reach;with bed alarm set;with family/visitor present Nurse Communication: Mobility status PT Visit Diagnosis: Other abnormalities of gait and mobility (R26.89);Muscle weakness (generalized) (M62.81)    Time: 7824-2353 PT Time Calculation (min) (ACUTE ONLY): 21 min   Charges:   PT Evaluation $PT Eval Low Complexity: 1 Low   PT General Charges $$ ACUTE PT VISIT: 1 Visit         Marye Round, PT DPT Acute Rehabilitation Services Secure Chat Preferred  Office [phone]   Kymberlyn Eckford Sheliah Plane 07/01/2023, 2:55 PM

## 2023-07-01 NOTE — Progress Notes (Signed)
Per patient's daughter in law at bedside, patient reported that she took cabometyx dose this morning at 7am before she ate breakfast. Dose sent back to pharmacy.

## 2023-07-01 NOTE — Progress Notes (Signed)
Progress Note   Patient: Gail Fitzgerald ZOX:[id number] DOB: Dec 27, 1934 DOA: 06/29/2023     2 DOS: the patient was seen and examined on 07/01/2023   Brief hospital course: Gail Fitzgerald was admitted to the hospital with the working diagnosis of decompensated heart failure.   88 yo female with the past medical history of renal cell carcinoma with lung metastatic disease, hypertension, chronic anemia, chronic hyponatremia and heart failure who presented with dyspnea. Reported 3 to 4 days of worsening dyspnea, associated with orthopnea and cough. 2 weeks of worsening lower extremity edema. On her initial physical examination her blood pressure was 152/108, HR 79, RR 18 and 02 saturation 95%, lungs with decreased breath sounds bilaterally, with no wheezing or rhonchi, heart with S1 and S2 present and regular, abdomen with no distention and positive bilateral lower extremity edema.   Na 121, K 3,3 CL 81 bicarbonate 27, glucose 96 bun 12 cr 0,50 Mg 1,4  BNP 509  High sensitive troponin 9 and 12  Wbc 8,4 hgb 10,9  plt 322   Chest radiograph with hyperinflation, bilateral pleural effusions, more right than left, left upper lobe subpleural opacity. Left lower lobe interstitial infiltrate.   CT chest with no pulmonary embolism, increased size of moderate right pleural effusion, worsening moderate loculated irregular left pleural effusions. Irregular pleural effusions with likely underlying deposits appears to be invasive along the mediastinum.  Passive atelectasis of the left lung with ground  glass airspace opacities.  Stable partially calcified 1.3 x 0,9 cm right apical pulmonary nodule.  Stable aneurysmal dilatation of the ascending aorta 4,2 cm.   EKG 72 bpm, left axis deviation, normal intervals, sinus rhythm with no significant ST segment or T wave changes.   [place] lower extremities with age indeterminate deep vein thrombosis involving the right peroneal veins. No common femoral vein  obstruction on the left.   10/21 right thoracentesis 600 cc fluids removed.  10/22 echocardiogram with preserved LV systolic function, improving volume status.   Assessment and Plan: * Acute on chronic diastolic CHF (congestive heart failure) (HCC) Echocardiogram with preserved LV systolic function with EF 55%, no LVH, RV systolic function preserved, RVSP 25,8, LA with mild dilatation, moderate aortic valve regurgitation, trivial pericardial effusion.   Urine output not documented.  Patient has lost 2 kg since admission.  Systolic blood pressure 142 to 119 mmHg.   Plan to continue diuresis with furosemide 40 mg IV bid.  Continue with spironolactone and SGLT 2 inh to augment diuresis.  On benazepril and metoprolol.   Acute hypoxemic respiratory failure due to bilateral pleural effusions and acute cardiogenic pulmonary edema.  In the setting of metastatic left lung disease.  Continue diuresis and oxymetry monitoring. Supplemental 02 per [place] to keep 02 saturation 92% or greater.   Acute deep vein thrombosis (DVT) of right lower extremity (HCC) Continue anticoagulation with apixaban.    Hyponatremia Hypomagnesemia. SIADH   Volume status has improved, renal function with serum cr at 0,52 with K at 4,2 and serum bicarbonate at 28. Na 120 Mg 2.1   Continue diuresis with furosemide, spironolactone and SGLT 2 inh.  Follow up renal function and electrolytes in am.    Essential hypertension Continue close blood pressure monitoring  Continue diuresis with furosemide, benazepril and metoprolol.   Renal cell carcinoma, left (HCC) Metastatic renal cell carcinoma, presented with large left renal mass and left hemithorax pleural bases metastasis as well as left hilar and mediastinal lymphadenopathies.   Had disease progression despite chemotherapy.  Currently  on chemotherapy, nivolumab and cabometyx.   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 Iron deficiency anemia. Continue iron  supplementation.         Subjective: Patient with improvement in dyspnea, having cough in am, no chest pain, no orthopnea and lower extremity edema improving.   Physical Exam: Vitals:   07/01/23 0325 07/01/23 0423 07/01/23 0722 07/01/23 1037  BP: (!) 145/53  (!) 142/64 119/60  Pulse: 69  72 71  Resp: 15  17 18   Temp: 98.5 F (36.9 C)  98 F (36.7 C) 98 F (36.7 C)  TempSrc: Oral  Oral Oral  SpO2: 97%  94% 97%  Weight:  43.4 kg    Height:       Neurology awake and alert ENT with mild pallor Cardiovascular with S1 and S2 present and regular with no gallops, rubs or murmurs Respiratory with no rales or wheezing, no rhonchi Abdomen with no distention  Positive lower extremity edema ++ bilaterally, pitting, more right than left.  Data Reviewed:    Family Communication: I spoke with patient's daughter in law at the bedside, we talked in detail about patient's condition, plan of care and prognosis and all questions were addressed. Daughter in law translated for patient.    Disposition: Status is: Inpatient Remains inpatient appropriate because: IV diuresis   Planned Discharge Destination: Home    Author: Coralie Keens, MD 07/01/2023 3:26 PM  For on call review [email or web address].

## 2023-07-01 NOTE — Plan of Care (Signed)

## 2023-07-01 NOTE — Evaluation (Signed)
Occupational Therapy Evaluation/Discharge Patient Details Name: Gail Fitzgerald MRN: [id number] DOB: 04/05/1935 Today's Date: 07/01/2023   History of Present Illness 88 yo female presents to [facility]20 with LE swelling, SOB due to HF exacerbation. Pt with age indeterminate DVT RLE, HF exacerbation with bilat pleural effusions, hyponatremia. S/p R thoracentesis 10/21 yielding 600 mL. PMH includes RCC with lung mets currently on chemo, HTN, anemia, HF.   Clinical Impression   PTA, pt lives with son and daughter in law, typically Modified Independent with ADLs, some IADLs and mobility using a walker. Pt has a caregiver that can assist with IADLs and going for walks as needed. Pt presents now fairly close to this reported baseline w/ no physical assistance needed for bathroom mobility using RW or ADLs. Encouraged frequent LE elevation to combat edema. No further skilled OT services needed acutely or on DC. Recommend continued OOB activities with mobility specialists while admitted.        If plan is discharge home, recommend the following: Assistance with cooking/housework;Assist for transportation    Functional Status Assessment  Patient has not had a recent decline in their functional status  Equipment Recommendations  None recommended by OT    Recommendations for Other Services       Precautions / Restrictions Precautions Precautions: Fall Precaution Comments: relatively low fall risk Restrictions Weight Bearing Restrictions: No      Mobility Bed Mobility               General bed mobility comments: EOB on entry    Transfers Overall transfer level: Modified independent Equipment used: Rolling walker (2 wheels) Transfers: Sit to/from Stand Sit to Stand: Modified independent (Device/Increase time)                  Balance Overall balance assessment: Needs assistance Sitting-balance support: No upper extremity supported, Feet supported Sitting balance-Leahy  Scale: Good     Standing balance support: No upper extremity supported, During functional activity, Bilateral upper extremity supported Standing balance-Leahy Scale: Fair Standing balance comment: fair+, furniture walking without AD w/ dynamic tasks                           ADL either performed or assessed with clinical judgement   ADL Overall ADL's : Modified independent                                       General ADL Comments: able to don socks, mobilize to/from bathroom with RW and without AD into/out of bathroom doorway. reports LE edema makes legs heavy and more difficult to move- educated on propping LE up and avoiding dependent position for long     Vision Baseline Vision/History: 1 Wears glasses Ability to See in Adequate Light: 0 Adequate Patient Visual Report: No change from baseline Vision Assessment?: No apparent visual deficits     Perception         Praxis         Pertinent Vitals/Pain Pain Assessment Pain Assessment: No/denies pain     Extremity/Trunk Assessment Upper Extremity Assessment Upper Extremity Assessment: Overall WFL for tasks assessed;Right hand dominant   Lower Extremity Assessment Lower Extremity Assessment: Defer to PT evaluation       Communication Communication Communication: No apparent difficulties;Other (comment) (use of [place] interpreter)   Cognition Arousal: Alert Behavior During Therapy: WFL for tasks assessed/performed  Overall Cognitive Status: Within Functional Limits for tasks assessed                                       General Comments  use of [place] ipad interpreter [place] during session    Exercises     Shoulder Instructions      Home Living Family/patient expects to be discharged to:: Private residence Living Arrangements: Children;Other relatives (son and daughter in law) Available Help at Discharge: Family;Personal care attendant Type of Home: House        Home Layout: Two level;Able to live on main level with bedroom/bathroom     Bathroom Shower/Tub: Producer, television/film/video: Standard     Home Equipment: Wheelchair - Forensic psychologist (2 wheels);Shower seat   Additional Comments: caregiver 1030AM-2PM per chart      Prior Functioning/Environment Prior Level of Function : Independent/Modified Independent             Mobility Comments: use of walker for mobilty in the home. caregiver assists with walks outside of the home ADLs Comments: Modified Independent with ADLs, can make herself breakfast daily and makes up her bed. family and caregiver provide IADL support as needed        OT Problem List:        OT Treatment/Interventions:      OT Goals(Current goals can be found in the care plan section) Acute Rehab OT Goals Patient Stated Goal: decrease LE swelling OT Goal Formulation: All assessment and education complete, DC therapy  OT Frequency:      Co-evaluation              AM-PAC OT "6 Clicks" Daily Activity     Outcome Measure Help from another person eating meals?: None Help from another person taking care of personal grooming?: None Help from another person toileting, which includes using toliet, bedpan, or urinal?: None Help from another person bathing (including washing, rinsing, drying)?: None Help from another person to put on and taking off regular upper body clothing?: None Help from another person to put on and taking off regular lower body clothing?: None 6 Click Score: 24   End of Session Equipment Utilized During Treatment: Rolling walker (2 wheels) Nurse Communication: Mobility status  Activity Tolerance: Patient tolerated treatment well Patient left: in bed;with call bell/phone within reach;with nursing/sitter in room  OT Visit Diagnosis: Muscle weakness (generalized) (M62.81)                Time: 5409-8119 OT Time Calculation (min): 25 min Charges:  OT General Charges $OT  Visit: 1 Visit OT Evaluation $OT Eval Low Complexity: 1 Low OT Treatments $Self Care/Home Management : 8-22 mins  Bradd Canary, OTR/L Acute Rehab Services Office: [phone]   Lorre Munroe 07/01/2023, 9:08 AM

## 2023-07-02 DIAGNOSIS — I5033 Acute on chronic diastolic (congestive) heart failure: Secondary | ICD-10-CM | POA: Diagnosis not present

## 2023-07-02 LAB — BASIC METABOLIC PANEL
Anion gap: 12 (ref 5–15)
Anion gap: 12 (ref 5–15)
BUN: 9 mg/dL (ref 8–23)
BUN: 13 mg/dL (ref 8–23)
CO2: 30 mmol/L (ref 22–32)
CO2: 30 mmol/L (ref 22–32)
Calcium: 7.8 mg/dL — ABNORMAL LOW (ref 8.9–10.3)
Calcium: 7.9 mg/dL — ABNORMAL LOW (ref 8.9–10.3)
Chloride: 79 mmol/L — ABNORMAL LOW (ref 98–111)
Chloride: 79 mmol/L — ABNORMAL LOW (ref 98–111)
Creatinine, Ser: 0.5 mg/dL (ref 0.44–1.00)
Creatinine, Ser: 0.55 mg/dL (ref 0.44–1.00)
GFR, Estimated: >60 mL/min (ref >60)
GFR, Estimated: >60 mL/min (ref >60)
Glucose, Bld: 93 mg/dL (ref 70–99)
Glucose, Bld: 124 mg/dL — ABNORMAL HIGH (ref 70–99)
Potassium: 3.5 mmol/L (ref 3.5–5.1)
Potassium: 3.9 mmol/L (ref 3.5–5.1)
Sodium: 121 mmol/L — ABNORMAL LOW (ref 135–145)
Sodium: 121 mmol/L — ABNORMAL LOW (ref 135–145)

## 2023-07-02 MED ORDER — POTASSIUM CHLORIDE 20 MEQ PO PACK
40.0000 meq | PACK | Freq: Two times a day (BID) | ORAL | Status: AC
  Administered 2023-07-02 (×2): 40 meq via ORAL
  Filled 2023-07-02 (×2): qty 2

## 2023-07-02 MED ORDER — ALBUMIN HUMAN 25 % IV SOLN
25.0000 g | Freq: Four times a day (QID) | INTRAVENOUS | Status: AC
  Administered 2023-07-02 (×2): 25 g via INTRAVENOUS
  Filled 2023-07-02 (×2): qty 100

## 2023-07-02 MED ORDER — POTASSIUM CHLORIDE CRYS ER 20 MEQ PO TBCR
40.0000 meq | EXTENDED_RELEASE_TABLET | Freq: Two times a day (BID) | ORAL | Status: DC
  Filled 2023-07-02: qty 2

## 2023-07-02 NOTE — Plan of Care (Signed)
  Problem: Clinical Measurements: Goal: Ability to maintain clinical measurements within normal limits will improve Outcome: Progressing Goal: Will remain free from infection Outcome: Progressing Goal: Respiratory complications will improve Outcome: Progressing   Problem: Activity: Goal: Risk for activity intolerance will decrease Outcome: Progressing   Problem: Safety: Goal: Ability to remain free from injury will improve Outcome: Progressing   Problem: Skin Integrity: Goal: Risk for impaired skin integrity will decrease Outcome: Progressing

## 2023-07-02 NOTE — Progress Notes (Signed)
the left thorax. Stable cardiomediastinal silhouette. Aortic atherosclerosis. IMPRESSION: 1. Decreased right pleural effusion with small residual. No convincing pneumothorax. 2. Similar extensive pleural and parenchymal metastatic disease in the left thorax. Electronically Signed   By: Jasmine Pang M.D.   On: 06/30/2023 15:12   ECHOCARDIOGRAM COMPLETE  Result Date: 06/30/2023    ECHOCARDIOGRAM REPORT   Patient Name:   Gail Fitzgerald Date of Exam: 06/30/2023 Medical Rec #:  [id number]        Height:       61.0 in Accession #:    [id number]       Weight:       98.0 lb Date of Birth:  16-Apr-1935        BSA:          1.395 m Patient Age:    88 years         BP:            156/75 mmHg Patient Gender: F                HR:           67 bpm. Exam Location:  Inpatient Procedure: 2D Echo, Color Doppler and Cardiac Doppler Indications:    CHF  History:        Patient has prior history of Echocardiogram examinations, most                 recent 03/11/2021. CHF, h/o DVT and renal cell carcinoma; Risk                 Factors:Hypertension.  Sonographer:    Milbert Coulter Referring Phys: [id number] JUSTIN B HOWERTER IMPRESSIONS  1. Left ventricular ejection fraction, by estimation, is 55%. The left ventricle has normal function. The left ventricle has no regional wall motion abnormalities. Left ventricular diastolic parameters are consistent with Grade I diastolic dysfunction (impaired relaxation).  2. Right ventricular systolic function is normal. The right ventricular size is normal. There is normal pulmonary artery systolic pressure. The estimated right ventricular systolic pressure is 25.8 mmHg.  3. Left atrial size was mildly dilated.  4. The mitral valve is normal in structure. No evidence of mitral valve regurgitation. No evidence of mitral stenosis.  5. The aortic valve is tricuspid. Aortic valve regurgitation is moderate. No aortic stenosis is present.  6. Aortic dilatation noted. There is mild dilatation of the ascending aorta, measuring 44 mm.  7. The inferior vena cava is normal in size with greater than 50% respiratory variability, suggesting right atrial pressure of 3 mmHg. FINDINGS  Left Ventricle: Left ventricular ejection fraction, by estimation, is 55%. The left ventricle has normal function. The left ventricle has no regional wall motion abnormalities. The left ventricular internal cavity size was normal in size. There is no left ventricular hypertrophy. Left ventricular diastolic parameters are consistent with Grade I diastolic dysfunction (impaired relaxation). Right Ventricle: The right ventricular size is normal. No increase in right ventricular wall thickness. Right  ventricular systolic function is normal. There is normal pulmonary artery systolic pressure. The tricuspid regurgitant velocity is 2.39 m/s, and  with an assumed right atrial pressure of 3 mmHg, the estimated right ventricular systolic pressure is 25.8 mmHg. Left Atrium: Left atrial size was mildly dilated. Right Atrium: Right atrial size was normal in size. Pericardium: Trivial pericardial effusion is present. Mitral Valve: The mitral valve is normal in structure. No evidence of mitral valve regurgitation. No evidence of mitral valve stenosis. Tricuspid Valve: The tricuspid  PROGRESS NOTE    Gail Fitzgerald  QQV:[id number] DOB: 04-25-1935 DOA: 06/29/2023 PCP: [facility]  88/F w/ metastatic renal cell carcinoma, pulmonary mets, diastolic CHF, chronic hyponatremia, chronic anemia, hypertension presented to the ED with worsening dyspnea, orthopnea, edema -In the emergency room hypertensive, labs with sodium 121, creatinine 0.5, mag 1.4, BNP 509, troponin 9, hemoglobin 10.9, chest x-ray with bilateral pleural effusions -CT chest, no PE, moderate right pleural effusion, worsening moderately loculated irregular left pleural effusions, passive atelectasis of left lung with groundglass opacities, stable partially calcified right apical nodule and stable dilation of ascending aorta   -Venous duplex noted age-indeterminate DVT of right peroneal veins  -10/21 right thoracentesis 600 cc fluids removed.  10/22 echocardiogram with preserved LV systolic function, improving volume status.   Subjective: -Feels weak and tired, breathing a little better, some cough -Conversed with patient using a [place] interpreter  Assessment and Plan:  Malignant right pleural effusion -Underwent thoracentesis 10/21, 600 mL of yellow fluid drained -Fluid exudative, cultures negative, cytology positive for malignant cells, poorly differentiated carcinoma -Prognosis is poor with advanced age, frailty, severe hypoalbuminemia along with metastatic RCC, discussed with Dr.Mohamed, agreed to Palliative consult  Acute on chronic diastolic CHF (congestive heart failure) (HCC) -Echo EF 55%, no LVH, RV systolic function preserved, RVSP 25,8, moderate aortic valve regurgitation,  -continue diuresis with furosemide 40 mg IV bid. Add IV albumin x2 doses Continue with spironolactone and Jardiance On benazepril and metoprolol.  -Monitor urine output, BMP in a.m.  Acute hypoxemic respiratory failure  -In the setting of malignant pleural effusion, pulmonary edema etc. -Improving,  diuretics as above  Metastatic renal cell carcinoma -Known pleural-based metastasis with hilar, mediastinal adenopathies -Had disease progression despite chemotherapy -Currently on cabozantinib, followed by Dr. Shirline Frees -See discussion above, prognosis is poor  Acute/subacute deep vein thrombosis (DVT) of right lower extremity (HCC) Continue anticoagulation with apixaban.    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 Iron deficiency anemia. Continue iron supplementation.   Severe protein calorie malnutrition -Albumin is 2.0, poor prognostic factor, oral intake is poor,   DVT prophylaxis: Apixaban Code Status: Full code Family Communication: No family at bedside, will update this afternoon Disposition Plan: Home likely 1 to 2 days  Consultants:    Procedures:   Antimicrobials:    Objective: Vitals:   07/01/23 2353 07/02/23 0533 07/02/23 0730 07/02/23 1110  BP: 138/62 (!) 141/82 139/70 (!) 120/53  Pulse:  81 76 69  Resp: 16 17 18 18   Temp: 97.6 F (36.4 C) 98.2 F (36.8 C) 97.8 F (36.6 C) 97.8 F (36.6 C)  TempSrc: Oral Oral Oral Oral  SpO2: 97% 91% 96% 96%  Weight:  41.6 kg    Height:        Intake/Output Summary (Last 24 hours) at 07/02/2023 1205 Last data filed at 07/01/2023 1834 Gross per 24 hour  Intake 480 ml  Output --  Net 480 ml   Filed Weights   06/30/23 0500 07/01/23 0423 07/02/23 0533  Weight: 44.5 kg 43.4 kg 41.6 kg    Examination:  General exam: Elderly cachectic female laying in bed, AAOx3 HEENT: Positive JVD CVS: S1-S2, regular rhythm Lungs: Few basilar Rales otherwise clear Abdomen: Soft, nontender, bowel sounds present Extremities: 2+ edema at the  ankles Skin:  the left thorax. Stable cardiomediastinal silhouette. Aortic atherosclerosis. IMPRESSION: 1. Decreased right pleural effusion with small residual. No convincing pneumothorax. 2. Similar extensive pleural and parenchymal metastatic disease in the left thorax. Electronically Signed   By: Jasmine Pang M.D.   On: 06/30/2023 15:12   ECHOCARDIOGRAM COMPLETE  Result Date: 06/30/2023    ECHOCARDIOGRAM REPORT   Patient Name:   Gail Fitzgerald Date of Exam: 06/30/2023 Medical Rec #:  [id number]        Height:       61.0 in Accession #:    [id number]       Weight:       98.0 lb Date of Birth:  16-Apr-1935        BSA:          1.395 m Patient Age:    88 years         BP:            156/75 mmHg Patient Gender: F                HR:           67 bpm. Exam Location:  Inpatient Procedure: 2D Echo, Color Doppler and Cardiac Doppler Indications:    CHF  History:        Patient has prior history of Echocardiogram examinations, most                 recent 03/11/2021. CHF, h/o DVT and renal cell carcinoma; Risk                 Factors:Hypertension.  Sonographer:    Milbert Coulter Referring Phys: [id number] JUSTIN B HOWERTER IMPRESSIONS  1. Left ventricular ejection fraction, by estimation, is 55%. The left ventricle has normal function. The left ventricle has no regional wall motion abnormalities. Left ventricular diastolic parameters are consistent with Grade I diastolic dysfunction (impaired relaxation).  2. Right ventricular systolic function is normal. The right ventricular size is normal. There is normal pulmonary artery systolic pressure. The estimated right ventricular systolic pressure is 25.8 mmHg.  3. Left atrial size was mildly dilated.  4. The mitral valve is normal in structure. No evidence of mitral valve regurgitation. No evidence of mitral stenosis.  5. The aortic valve is tricuspid. Aortic valve regurgitation is moderate. No aortic stenosis is present.  6. Aortic dilatation noted. There is mild dilatation of the ascending aorta, measuring 44 mm.  7. The inferior vena cava is normal in size with greater than 50% respiratory variability, suggesting right atrial pressure of 3 mmHg. FINDINGS  Left Ventricle: Left ventricular ejection fraction, by estimation, is 55%. The left ventricle has normal function. The left ventricle has no regional wall motion abnormalities. The left ventricular internal cavity size was normal in size. There is no left ventricular hypertrophy. Left ventricular diastolic parameters are consistent with Grade I diastolic dysfunction (impaired relaxation). Right Ventricle: The right ventricular size is normal. No increase in right ventricular wall thickness. Right  ventricular systolic function is normal. There is normal pulmonary artery systolic pressure. The tricuspid regurgitant velocity is 2.39 m/s, and  with an assumed right atrial pressure of 3 mmHg, the estimated right ventricular systolic pressure is 25.8 mmHg. Left Atrium: Left atrial size was mildly dilated. Right Atrium: Right atrial size was normal in size. Pericardium: Trivial pericardial effusion is present. Mitral Valve: The mitral valve is normal in structure. No evidence of mitral valve regurgitation. No evidence of mitral valve stenosis. Tricuspid Valve: The tricuspid  the left thorax. Stable cardiomediastinal silhouette. Aortic atherosclerosis. IMPRESSION: 1. Decreased right pleural effusion with small residual. No convincing pneumothorax. 2. Similar extensive pleural and parenchymal metastatic disease in the left thorax. Electronically Signed   By: Jasmine Pang M.D.   On: 06/30/2023 15:12   ECHOCARDIOGRAM COMPLETE  Result Date: 06/30/2023    ECHOCARDIOGRAM REPORT   Patient Name:   Gail Fitzgerald Date of Exam: 06/30/2023 Medical Rec #:  [id number]        Height:       61.0 in Accession #:    [id number]       Weight:       98.0 lb Date of Birth:  16-Apr-1935        BSA:          1.395 m Patient Age:    88 years         BP:            156/75 mmHg Patient Gender: F                HR:           67 bpm. Exam Location:  Inpatient Procedure: 2D Echo, Color Doppler and Cardiac Doppler Indications:    CHF  History:        Patient has prior history of Echocardiogram examinations, most                 recent 03/11/2021. CHF, h/o DVT and renal cell carcinoma; Risk                 Factors:Hypertension.  Sonographer:    Milbert Coulter Referring Phys: [id number] JUSTIN B HOWERTER IMPRESSIONS  1. Left ventricular ejection fraction, by estimation, is 55%. The left ventricle has normal function. The left ventricle has no regional wall motion abnormalities. Left ventricular diastolic parameters are consistent with Grade I diastolic dysfunction (impaired relaxation).  2. Right ventricular systolic function is normal. The right ventricular size is normal. There is normal pulmonary artery systolic pressure. The estimated right ventricular systolic pressure is 25.8 mmHg.  3. Left atrial size was mildly dilated.  4. The mitral valve is normal in structure. No evidence of mitral valve regurgitation. No evidence of mitral stenosis.  5. The aortic valve is tricuspid. Aortic valve regurgitation is moderate. No aortic stenosis is present.  6. Aortic dilatation noted. There is mild dilatation of the ascending aorta, measuring 44 mm.  7. The inferior vena cava is normal in size with greater than 50% respiratory variability, suggesting right atrial pressure of 3 mmHg. FINDINGS  Left Ventricle: Left ventricular ejection fraction, by estimation, is 55%. The left ventricle has normal function. The left ventricle has no regional wall motion abnormalities. The left ventricular internal cavity size was normal in size. There is no left ventricular hypertrophy. Left ventricular diastolic parameters are consistent with Grade I diastolic dysfunction (impaired relaxation). Right Ventricle: The right ventricular size is normal. No increase in right ventricular wall thickness. Right  ventricular systolic function is normal. There is normal pulmonary artery systolic pressure. The tricuspid regurgitant velocity is 2.39 m/s, and  with an assumed right atrial pressure of 3 mmHg, the estimated right ventricular systolic pressure is 25.8 mmHg. Left Atrium: Left atrial size was mildly dilated. Right Atrium: Right atrial size was normal in size. Pericardium: Trivial pericardial effusion is present. Mitral Valve: The mitral valve is normal in structure. No evidence of mitral valve regurgitation. No evidence of mitral valve stenosis. Tricuspid Valve: The tricuspid  PROGRESS NOTE    Gail Fitzgerald  QQV:[id number] DOB: 04-25-1935 DOA: 06/29/2023 PCP: [facility]  88/F w/ metastatic renal cell carcinoma, pulmonary mets, diastolic CHF, chronic hyponatremia, chronic anemia, hypertension presented to the ED with worsening dyspnea, orthopnea, edema -In the emergency room hypertensive, labs with sodium 121, creatinine 0.5, mag 1.4, BNP 509, troponin 9, hemoglobin 10.9, chest x-ray with bilateral pleural effusions -CT chest, no PE, moderate right pleural effusion, worsening moderately loculated irregular left pleural effusions, passive atelectasis of left lung with groundglass opacities, stable partially calcified right apical nodule and stable dilation of ascending aorta   -Venous duplex noted age-indeterminate DVT of right peroneal veins  -10/21 right thoracentesis 600 cc fluids removed.  10/22 echocardiogram with preserved LV systolic function, improving volume status.   Subjective: -Feels weak and tired, breathing a little better, some cough -Conversed with patient using a [place] interpreter  Assessment and Plan:  Malignant right pleural effusion -Underwent thoracentesis 10/21, 600 mL of yellow fluid drained -Fluid exudative, cultures negative, cytology positive for malignant cells, poorly differentiated carcinoma -Prognosis is poor with advanced age, frailty, severe hypoalbuminemia along with metastatic RCC, discussed with Dr.Mohamed, agreed to Palliative consult  Acute on chronic diastolic CHF (congestive heart failure) (HCC) -Echo EF 55%, no LVH, RV systolic function preserved, RVSP 25,8, moderate aortic valve regurgitation,  -continue diuresis with furosemide 40 mg IV bid. Add IV albumin x2 doses Continue with spironolactone and Jardiance On benazepril and metoprolol.  -Monitor urine output, BMP in a.m.  Acute hypoxemic respiratory failure  -In the setting of malignant pleural effusion, pulmonary edema etc. -Improving,  diuretics as above  Metastatic renal cell carcinoma -Known pleural-based metastasis with hilar, mediastinal adenopathies -Had disease progression despite chemotherapy -Currently on cabozantinib, followed by Dr. Shirline Frees -See discussion above, prognosis is poor  Acute/subacute deep vein thrombosis (DVT) of right lower extremity (HCC) Continue anticoagulation with apixaban.    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 Iron deficiency anemia. Continue iron supplementation.   Severe protein calorie malnutrition -Albumin is 2.0, poor prognostic factor, oral intake is poor,   DVT prophylaxis: Apixaban Code Status: Full code Family Communication: No family at bedside, will update this afternoon Disposition Plan: Home likely 1 to 2 days  Consultants:    Procedures:   Antimicrobials:    Objective: Vitals:   07/01/23 2353 07/02/23 0533 07/02/23 0730 07/02/23 1110  BP: 138/62 (!) 141/82 139/70 (!) 120/53  Pulse:  81 76 69  Resp: 16 17 18 18   Temp: 97.6 F (36.4 C) 98.2 F (36.8 C) 97.8 F (36.6 C) 97.8 F (36.6 C)  TempSrc: Oral Oral Oral Oral  SpO2: 97% 91% 96% 96%  Weight:  41.6 kg    Height:        Intake/Output Summary (Last 24 hours) at 07/02/2023 1205 Last data filed at 07/01/2023 1834 Gross per 24 hour  Intake 480 ml  Output --  Net 480 ml   Filed Weights   06/30/23 0500 07/01/23 0423 07/02/23 0533  Weight: 44.5 kg 43.4 kg 41.6 kg    Examination:  General exam: Elderly cachectic female laying in bed, AAOx3 HEENT: Positive JVD CVS: S1-S2, regular rhythm Lungs: Few basilar Rales otherwise clear Abdomen: Soft, nontender, bowel sounds present Extremities: 2+ edema at the  ankles Skin:

## 2023-07-02 NOTE — Progress Notes (Signed)
DIAGNOSIS: Metastatic renal cell carcinoma presented with large left renal mass in addition to significant left hemothorax pleural-based metastasis as well as left hilar and mediastinal lymphadenopathy diagnosed in August 2022.   Biomarker Findings Microsatellite status - MS-Stable Tumor Mutational Burden - 2 Muts/Mb Genomic Findings For a complete list of the genes assayed, please refer to the Appendix. MTAP loss CDKN2A/B CDKN2A loss 8 Disease relevant genes with no reportable Alteration   PD-L1 expression 0%   PRIOR THERAPY: Systemic immunotherapy with ipilimumab 1 mg/KG in addition to nivolumab 3 mg/KG every 3 weeks for 4 cycles followed by maintenance treatment with nivolumab.  Status post 3 cycles.  First dose started on 05/22/2021.  This treatment was discontinued secondary to disease progression.     CURRENT THERAPY: Opdivo 480 Mg IV every 4 weeks with Cabometyx 40 mg p.o. daily.  First dose August 21, 2021.  Status post 25  month of treatment.  Her dose of Cabometyx will be reduced to 20 mg p.o. daily starting 01/08/2022 secondary to persistent diarrhea.  Subjective: The patient, with a history of stage four kidney cancer, presented with increased weakness and leg swelling. The swelling was significantly more than previously observed. The patient also reported shortness of breath. These symptoms led to an emergency room visit where heart failure was suspected.  During the hospital stay, fluid was collected from the right lung, despite previous fluid accumulation in the left lung. An x-ray and Doppler ultrasound were performed, revealing a small clot around the right ankle. She started treatment with Eliquis.  The patient also reported increased phlegm production and an episode of vomiting, which was attributed to the large size of the potassium pills.  The patient's sodium levels remained low despite the introduction of a new medication and salt tablets.  The patient's condition  was further complicated by weight loss. Despite these challenges, the patient expressed a desire to continue aggressive treatment for the kidney cancer.  Objective: Vital signs in last 24 hours: Temp:  [97.6 F (36.4 C)-99.3 F (37.4 C)] 97.8 F (36.6 C) (10/23 1535) Pulse Rate:  [69-81] 77 (10/23 1535) Resp:  [16-18] 17 (10/23 1535) BP: (114-141)/(53-82) 114/58 (10/23 1535) SpO2:  [91 %-97 %] 95 % (10/23 1535) Weight:  [91 lb 11.2 oz (41.6 kg)] 91 lb 11.2 oz (41.6 kg) (10/23 0533)  Intake/Output from previous day: 10/22 0701 - 10/23 0700 In: 720 [P.O.:720] Out: -  Intake/Output this shift: Total I/O In: 120 [P.O.:120] Out: -   General appearance: alert, cooperative, cachectic, fatigued, and no distress Resp: diminished breath sounds LLL and dullness to percussion LLL Cardio: irregularly irregular rhythm GI: soft, non-tender; bowel sounds normal; no masses,  no organomegaly Extremities: extremities normal, atraumatic, no cyanosis or edema  Lab Results:  Recent Labs    06/30/23 0243  WBC 6.5  HGB 10.6*  HCT 32.3*  PLT 287   BMET Recent Labs    07/02/23 0257 07/02/23 1521  NA 121* 121*  K 3.5 3.9  CL 79* 79*  CO2 30 30  GLUCOSE 93 124*  BUN 9 13  CREATININE 0.50 0.55  CALCIUM 7.8* 7.9*    Studies/Results: No results found.  Medications: I have reviewed the patient's current medications.  CODE STATUS: Full Code  Assessment/Plan: This is a 88 years old white female who originally from [place] with metastatic renal cell carcinoma presented with large left renal mass in addition to left hemithorax pleural-based metastasis as well as left hilar and mediastinal lymphadenopathy diagnosed in 2022.  Her molecular studies showed no actionable mutations and PD-L1 expression was negative. The patient had MRI of the brain performed recently that showed no evidence of metastatic disease to the brain. She started systemic treatment with immunotherapy with ipilimumab 1  mg/KG as well as nivolumab 3 mg/KG every 3 weeks status post 3 cycles.  This treatment was discontinued secondary to disease progression. She is currently on treatment with a combination of nivolumab 480 Mg IV every 4 weeks in addition to Cabometyx 40 mg p.o. daily status post 23 cycles. Her dose of Cabometyx was reduced to 20 mg p.o. daily starting cycle #6 secondary to drug-induced diarrhea.  Stage 4 Kidney Cancer Patient recently received immunotherapy. New pleural effusion on the right side, previously known effusion on the left. Fluid from right side tested positive for cancer cells. -Continue current treatment plan with immunotherapy and cabozantinib. - consider CT of the Abdomen and pelvis before discharge to assess disease status. It may help with the palliative team discussion if there is any progression  Heart Failure New diagnosis suspected by hospital team, possibly related to age or side effect of immunotherapy. -Management by hospital team currently, will reassess after discharge.  Hyponatremia Persistent despite new medication and salt tablets. -Continue current treatment, monitor sodium levels.  Deep Vein Thrombosis Small clot found in right ankle, unclear if new or old. -Started on Eliquis (5mg  twice daily).  Palliative Care Discussion initiated due to multiple health issues and advanced stage of cancer. -Consult with palliative care team to discuss goals of care.  Weight Loss Noted significant weight loss. -Monitor nutritional status, consider dietary consult if needed.  Follow-up Plan to see patient in office after hospital discharge.   LOS: 3 days    Lajuana Matte 07/02/2023

## 2023-07-03 ENCOUNTER — Other Ambulatory Visit (HOSPITAL_COMMUNITY): Payer: Self-pay

## 2023-07-03 ENCOUNTER — Telehealth (HOSPITAL_COMMUNITY): Payer: Self-pay | Admitting: Pharmacy Technician

## 2023-07-03 ENCOUNTER — Encounter: Payer: Self-pay | Admitting: Internal Medicine

## 2023-07-03 DIAGNOSIS — R600 Localized edema: Secondary | ICD-10-CM | POA: Diagnosis not present

## 2023-07-03 DIAGNOSIS — E871 Hypo-osmolality and hyponatremia: Secondary | ICD-10-CM | POA: Diagnosis not present

## 2023-07-03 DIAGNOSIS — Z515 Encounter for palliative care: Secondary | ICD-10-CM

## 2023-07-03 DIAGNOSIS — Z711 Person with feared health complaint in whom no diagnosis is made: Secondary | ICD-10-CM

## 2023-07-03 DIAGNOSIS — Z7189 Other specified counseling: Secondary | ICD-10-CM

## 2023-07-03 DIAGNOSIS — Z789 Other specified health status: Secondary | ICD-10-CM

## 2023-07-03 DIAGNOSIS — J90 Pleural effusion, not elsewhere classified: Secondary | ICD-10-CM | POA: Diagnosis not present

## 2023-07-03 DIAGNOSIS — Z66 Do not resuscitate: Secondary | ICD-10-CM

## 2023-07-03 DIAGNOSIS — I5033 Acute on chronic diastolic (congestive) heart failure: Secondary | ICD-10-CM | POA: Diagnosis not present

## 2023-07-03 LAB — BASIC METABOLIC PANEL
Anion gap: 11 (ref 5–15)
BUN: 9 mg/dL (ref 8–23)
CO2: 31 mmol/L (ref 22–32)
Calcium: 8.6 mg/dL — ABNORMAL LOW (ref 8.9–10.3)
Chloride: 80 mmol/L — ABNORMAL LOW (ref 98–111)
Creatinine, Ser: 0.5 mg/dL (ref 0.44–1.00)
GFR, Estimated: >60 mL/min (ref >60)
Glucose, Bld: 88 mg/dL (ref 70–99)
Potassium: 4.2 mmol/L (ref 3.5–5.1)
Sodium: 122 mmol/L — ABNORMAL LOW (ref 135–145)

## 2023-07-03 LAB — CHOLESTEROL, BODY FLUID: Cholesterol, Fluid: 119 mg/dL

## 2023-07-03 LAB — CBC
HCT: 33.9 % — ABNORMAL LOW (ref 36.0–46.0)
Hemoglobin: 11.3 g/dL — ABNORMAL LOW (ref 12.0–15.0)
MCH: 29.3 pg (ref 26.0–34.0)
MCHC: 33.3 g/dL (ref 30.0–36.0)
MCV: 87.8 fL (ref 80.0–100.0)
Platelets: 274 10*3/uL (ref 150–400)
RBC: 3.86 MIL/uL — ABNORMAL LOW (ref 3.87–5.11)
RDW: 14.3 % (ref 11.5–15.5)
WBC: 10.5 10*3/uL (ref 4.0–10.5)
nRBC: 0 % (ref 0.0–0.2)

## 2023-07-03 LAB — BODY FLUID CULTURE W GRAM STAIN: Culture: NO GROWTH

## 2023-07-03 MED ORDER — DEMECLOCYCLINE HCL 150 MG PO TABS
150.0000 mg | ORAL_TABLET | Freq: Two times a day (BID) | ORAL | Status: DC
  Administered 2023-07-03: 150 mg via ORAL
  Filled 2023-07-03 (×2): qty 1

## 2023-07-03 MED ORDER — EMPAGLIFLOZIN 10 MG PO TABS
10.0000 mg | ORAL_TABLET | Freq: Every day | ORAL | 0 refills | Status: DC
  Filled 2023-07-03: qty 30, 30d supply, fill #0

## 2023-07-03 MED ORDER — SPIRONOLACTONE 25 MG PO TABS: 25.0000 mg | ORAL_TABLET | Freq: Every day | ORAL | 0 refills | Status: DC

## 2023-07-03 MED ORDER — TORSEMIDE 20 MG PO TABS
20.0000 mg | ORAL_TABLET | Freq: Every day | ORAL | 0 refills | Status: DC
  Filled 2023-07-03: qty 30, 30d supply, fill #0

## 2023-07-03 MED ORDER — APIXABAN 5 MG PO TABS
5.0000 mg | ORAL_TABLET | Freq: Two times a day (BID) | ORAL | 0 refills | Status: DC
  Filled 2023-07-03: qty 60, 30d supply, fill #0

## 2023-07-03 NOTE — Plan of Care (Signed)

## 2023-07-03 NOTE — TOC Transition Note (Signed)
Transition of Care [facility]) - CM/SW Discharge Note   Patient Details  Name: Gail Fitzgerald MRN: [id number] Date of Birth: [birth year]  Transition of Care [facility]) CM/SW Contact:  Leone Haven, RN Phone Number: 07/03/2023, 3:11 PM   Clinical Narrative:    NCM made referral to [facility] with [facility] , she is able to take referral.  Soc will begin 24 to 48 hrs post dc.  Son at bedside to transport patient home.  Son also chose [facility] for outpatient palliative services.  NCM made referral to [facility], she is able to take referral.    Final next level of care: Home w Home Health Services Barriers to Discharge: No Barriers Identified   Patient Goals and CMS Choice CMS Medicare.gov Compare Post Acute Care list provided to:: Patient Represenative (must comment) Choice offered to / list presented to : Patient  Discharge Placement                         Discharge Plan and Services Additional resources added to the After Visit Summary for   In-house Referral: NA Discharge Planning Services: CM Consult Post Acute Care Choice: NA          DME Arranged: N/A DME Agency: NA       HH Arranged: RN, PT HH Agency: [facility] Date HH Agency Contacted: 07/03/23 Time HH Agency Contacted: 1511 Representative spoke with at [facility] Agency: Tresa Endo  Social Determinants of Health (SDOH) Interventions SDOH Screenings   Food Insecurity: No Food Insecurity (06/29/2023)  Housing: Low Risk  (06/29/2023)  Transportation Needs: No Transportation Needs (06/29/2023)  Utilities: Not At Risk (06/29/2023)  Tobacco Use: Low Risk  (06/30/2023)     Readmission Risk Interventions     No data to display

## 2023-07-03 NOTE — Progress Notes (Signed)
Discharge instructions provided to patient, son, and daughter-in-law with interpreter. Patient and family verbalize understanding of all instructions and follow-up care. Patient discharged to home with all belongings at 1600 on 07/03/23 via wheelchair by NT.

## 2023-07-03 NOTE — Progress Notes (Signed)
PT Cancellation Note  Patient Details Name: Gail Fitzgerald MRN: [id number] DOB: 11-03-1934   Cancelled Treatment:    Reason Eval/Treat Not Completed: Patient declined, no reason specified. Pt requested to not have therapy today as she received difficulty news from palliative care. Acute PT to follow as able.  Hilton Cork, PT, DPT Secure Chat Preferred  Rehab Office [phone]   Arturo Morton Brion Aliment 07/03/2023, 2:24 PM

## 2023-07-03 NOTE — Consult Note (Signed)
Consultation Note Date: 07/03/2023   Patient Name: Gail Fitzgerald  DOB: 04-Apr-1935  MRN: [id number]  Age / Sex: 88 y.o., female  PCP: [facility] Referring Physician: Zannie Cove, MD  Reason for Consultation: Establishing goals of care, "goals of care, poor prognosis "  HPI/Patient Profile: 88 y.o. female  with past medical history of  renal cell carcinoma with metastatic disease to the lung, hypertension, anemia of chronic disease associated baseline hemoglobin 11-12, diastolic heart failure,  hyponatremia associated with baseline serum sodium level of 125-132 was admitted on 06/29/2023 with acute on chronic diastolic HF, bilateral pleural effusions, age indeterminate DVT of right lower extremity, ascending thoracic aortic aneurysm, acute on chronic hypoosmolar hyponatremia, hypomagnesemia, severe protein calorie malnutrition.   10/21 - thoracentesis - cytology positive for malignant cells  Clinical Assessment and Goals of Care: I have reviewed medical records including EPIC notes, labs, and imaging. Received report from primary RN - no acute concerns.  Discussed case in detail with Dr. Jomarie Longs.    Went to visit patient at bedside - son present. Patient was lying in bed awake, alert, oriented, and able to participate in conversation. [place] interpreter was used for duration of visit today: Milinda Cave [phone]. No signs or non-verbal gestures of pain or discomfort noted. No respiratory distress, increased work of breathing, or secretions noted. Patient had just finished with her lunch tray.  Met with patient, her son in person, and DIL via facetime  to discuss diagnosis, prognosis, GOC, EOL wishes, disposition, and options.  I introduced Palliative Medicine as specialized medical care for people living with serious illness. It focuses on providing relief from the symptoms and stress of a  serious illness. The goal is to improve quality of life for both the patient and the family.  We discussed a brief life review of the patient as well as functional and nutritional status. Prior to hospitalization, patient was living in a private residence with her son and daughter in Social worker. Her appetite was poor at home. Albumin noted at 2.0 on 10/21.  We discussed patient's current illness and what it means in the larger context of patient's on-going co-morbidities.  Patient and her family have a clear understanding of patient's current acute medical situation. Patient tells me, "I know my cancer will not get better." Natural disease trajectory and expectations at EOL were discussed. I attempted to elicit values and goals of care important to the patient. The difference between aggressive medical intervention and comfort care was considered in light of the patient's goals of care.   Reviewed that patient would be high risk for rehospitalization without hospice support. Provided education and counseling at length on the philosophy and benefits of hospice care. Discussed that it offers a holistic approach to care in the setting of end-stage illness, and is about supporting the patient where they are allowing nature to take it's course. Discussed the hospice team includes RNs, physicians, social workers, and chaplains. They can provide personal care, support for the family, and help keep patient out of the hospital as well as assist with DME needs for home hospice. Education provided on the difference between home vs residential hospice.   Patient indicates she is not ready for hospice at this time, though her wish is to remain at home and comfortable for as long as she can. She tells me "I need to fight." Her next Oncology appt is November 12 and she will continue discussions with MD then.  Palliative Care services outpatient were  explained and offered - she was accepting and appreciative. Education provided  that outpatient Palliative Care can enroll her in hospice if/when she is ready.  Encouraged patient to consider DNR/DNI status understanding evidenced based poor outcomes in similar hospitalized patient, as the cause of arrest is likely associated with advanced chronic/terminal illness rather than an easily reversible acute cardio-pulmonary event.  I shared that even if we pursued resuscitation we would not able to resolve the underlying factors. I explained that DNR/DNI does not change the medical plan and it only comes into effect after a person has arrested (died).  It is a protective measure to keep [place] from harming the patient in their last moments of life. Patient opted for DNR/DNI with understanding that she would not receive CPR, defibrillation, ACLS medications, or intubation. She would want a peaceful, natural passing when it is her time.  Discussed with patient/family the importance of continued conversation with each other and the medical providers regarding overall plan of care and treatment options, ensuring decisions are within the context of the patient's values and GOCs.    Patient and family express appreciation for discussions today. Son and DIL feel comfortable with patient returning home with them today.  Questions and concerns were addressed. The patient/family was encouraged to call with questions and/or concerns. PMT card was provided.   Primary Decision Maker: PATIENT    SUMMARY OF RECOMMENDATIONS   Discharge home today with outpatient Palliative Care to follow - TOC notified Now DNR/DNI - durable DNR form completed and placed in shadow chart. Copy was made and will be scanned into Vynca/ACP tab PMT will continue to follow peripherally. If there are any imminent needs please call the service directly   Code Status/Advance Care Planning: DNR  Palliative Prophylaxis:  Aspiration, Delirium Protocol, Frequent Pain Assessment, Oral Care, and Turn Reposition  Additional  Recommendations (Limitations, Scope, Preferences): No Artificial Feeding and No Tracheostomy  Psycho-social/Spiritual:  Desire for further Chaplaincy support:no Created space and opportunity for patient and family to express thoughts and feelings regarding patient's current medical situation.  Emotional support and therapeutic listening provided.  Prognosis:  < 6 months would not be surprising   Discharge Planning: Home with Palliative Services      Primary Diagnoses: Present on Admission:  Acute on chronic diastolic CHF (congestive heart failure) (HCC)  Acute deep vein thrombosis (DVT) of right lower extremity (HCC)  Hyponatremia  Essential hypertension  Renal cell carcinoma, left (HCC)   I have reviewed the medical record, interviewed the patient and family, and examined the patient. The following aspects are pertinent.  Past Medical History:  Diagnosis Date   Hypertension    Renal cell carcinoma (HCC)    With mets to the lungs   Social History   Socioeconomic History   Marital status: Widowed    Spouse name: Not on file   Number of children: Not on file   Years of education: Not on file   Highest education level: Not on file  Occupational History   Not on file  Tobacco Use   Smoking status: Never   Smokeless tobacco: Never  Substance and Sexual Activity   Alcohol use: Not Currently   Drug use: Not Currently   Sexual activity: Not Currently  Other Topics Concern   Not on file  Social History Narrative   Not on file   Social Determinants of Health   Financial Resource Strain: Not on file  Food Insecurity: No Food Insecurity (06/29/2023)   Hunger Vital  Sign    Worried About Programme researcher, broadcasting/film/video in the Last Year: Never true    Ran Out of Food in the Last Year: Never true  Transportation Needs: No Transportation Needs (06/29/2023)   PRAPARE - Administrator, Civil Service (Medical): No    Lack of Transportation (Non-Medical): No  Physical  Activity: Not on file  Stress: Not on file  Social Connections: Not on file   Family History  Problem Relation Age of Onset   Diabetes Neg Hx    Kidney disease Neg Hx    Scheduled Meds:  apixaban  5 mg Oral BID   benazepril  20 mg Oral Daily   cabozantinib  20 mg Oral Daily   demeclocycline  150 mg Oral Q12H   empagliflozin  10 mg Oral Daily   feeding supplement  237 mL Oral BID BM   ferrous sulfate  325 mg Oral Q breakfast   metoprolol tartrate  50 mg Oral BID   mirtazapine  30 mg Oral QHS   pantoprazole  40 mg Oral Daily   spironolactone  12.5 mg Oral Daily   Continuous Infusions: PRN Meds:.acetaminophen **OR** acetaminophen, melatonin, ondansetron (ZOFRAN) IV Medications Prior to Admission:  Prior to Admission medications   Medication Sig Start Date End Date Taking? Authorizing Provider  acetaminophen (TYLENOL) 325 MG tablet Take 650 mg by mouth every 6 (six) hours as needed for headache.   Yes [provider]  amLODipine (NORVASC) 10 MG tablet Take 5 mg by mouth daily. 09/14/21  Yes [provider]  benazepril (LOTENSIN) 20 MG tablet Take 20 mg by mouth daily. 06/11/21  Yes [provider]  cabozantinib (CABOMETYX) 20 MG tablet Take 1 tablet (20 mg total) by mouth daily. Take on an empty stomach, 1 hour before or 2 hours after meals. 06/16/23  Yes Si Gaul, MD  celecoxib (CELEBREX) 100 MG capsule Take 1 capsule (100 mg total) by mouth daily. 06/05/23  Yes Pickenpack-Cousar, Arty Baumgartner, NP  ferrous sulfate 325 (65 FE) MG EC tablet TAKE 1 TABLET BY MOUTH EVERY DAY WITH BREAKFAST Patient taking differently: Take 325 mg by mouth daily with breakfast. 02/09/22  Yes Heilingoetter, Cassandra L, PA-C  hydrocortisone (ANUSOL-HC) 2.5 % rectal cream Place 1 Application rectally 2 (two) times daily. 06/24/23  Yes Si Gaul, MD  Magnesium 250 MG TABS Take 1 tablet by mouth every evening.   Yes [provider]  metoprolol tartrate (LOPRESSOR) 50 MG  tablet Take 50 mg by mouth 2 (two) times daily. 03/11/22  Yes [provider]  mirtazapine (REMERON) 30 MG tablet Take 1 tablet (30 mg total) by mouth at bedtime. 06/24/23  Yes Si Gaul, MD  Multiple Vitamins-Minerals (PRESERVISION AREDS 2 PO) Take 1 tablet by mouth in the morning and at bedtime.   Yes [provider]  oxyCODONE-acetaminophen (PERCOCET/ROXICET) 5-325 MG tablet Take 1 tablet by mouth every 8 (eight) hours as needed for severe pain (pain score 7-10). 06/24/23  Yes Si Gaul, MD  pantoprazole (PROTONIX) 40 MG tablet Take 1 tablet (40 mg total) by mouth daily. 08/20/22  Yes Pickenpack-Cousar, Arty Baumgartner, NP  sodium chloride 1 g tablet Take 1 tablet (1 g total) by mouth 2 (two) times daily with a meal. 06/24/23  Yes Si Gaul, MD  torsemide (DEMADEX) 20 MG tablet Take 1 tablet (20 mg total) by mouth daily. 07/03/23  Yes Zannie Cove, MD  Valerian Root 100 MG CAPS Take 1 capsule by mouth daily as needed (  for anxiety).   Yes [provider]  apixaban (ELIQUIS) 5 MG TABS tablet Take 1 tablet (5 mg total) by mouth 2 (two) times daily. 07/03/23 10/01/23  Zannie Cove, MD  empagliflozin (JARDIANCE) 10 MG TABS tablet Take 1 tablet (10 mg total) by mouth daily. 07/04/23   Zannie Cove, MD  Misc. Devices [facility]) MISC Light weight 09/18/21   [provider]   No Known Allergies Review of Systems  Constitutional:  Positive for activity change and fatigue.  Respiratory:  Negative for shortness of breath.   Gastrointestinal:  Negative for nausea and vomiting.  All other systems reviewed and are negative.   Physical Exam Vitals and nursing note reviewed.  Constitutional:      General: She is not in acute distress.    Appearance: She is cachectic. She is ill-appearing.  Pulmonary:     Effort: No respiratory distress.  Skin:    General: Skin is warm and dry.  Neurological:     Mental Status: She is alert and oriented to  person, place, and time.     Motor: Weakness present.  Psychiatric:        Attention and Perception: Attention normal.        Behavior: Behavior is cooperative.        Cognition and Memory: Cognition and memory normal.     Vital Signs: BP 129/65 (BP Location: Left Arm)   Pulse 70   Temp 98.3 F (36.8 C) (Oral)   Resp 16   Ht 5\' 1"  (1.549 m)   Wt 41 kg   SpO2 95%   BMI 17.08 kg/m  Pain Scale: 0-10   Pain Score: 0-No pain   SpO2: SpO2: 95 % O2 Device:SpO2: 95 % O2 Flow Rate: .   IO: Intake/output summary:  Intake/Output Summary (Last 24 hours) at 07/03/2023 1331 Last data filed at 07/03/2023 0828 Gross per 24 hour  Intake 417 ml  Output --  Net 417 ml    LBM: Last BM Date : 06/28/23 Baseline Weight: Weight: 45.4 kg Most recent weight: Weight: 41 kg     Palliative Assessment/Data: PPS 50%     Time In: 1330 Time Out: 1500 Time Total: 90 minutes  Signed by: Haskel Khan, NP   Please contact Palliative Medicine Team phone at [phone] for questions and concerns.  For individual provider: See Amion  *Portions of this note are a verbal dictation therefore any spelling and/or grammatical errors are due to the "[employer]agon [employer] One" system interpretation.

## 2023-07-03 NOTE — Discharge Summary (Signed)
Physician Discharge Summary  Gail Fitzgerald ZOX:[id number] DOB: 07-30-1935 DOA: 06/29/2023  PCP: [facility]  Admit date: 06/29/2023 Discharge date: 07/03/2023  Time spent: 45 minutes  Recommendations for Outpatient Follow-up:  Outpatient palliative care, continue goals of care discussion, anticipate need for transition to hospice in the near future   Discharge Diagnoses:  Malignant right pleural effusion Metastatic renal cell carcinoma Severe protein calorie malnutrition Hypoalbuminemia   Acute on chronic diastolic CHF (congestive heart failure) (HCC) Active Problems:   Acute deep vein thrombosis (DVT) of right lower extremity (HCC)   Hyponatremia   Essential hypertension   Renal cell carcinoma, left (HCC)   Discharge Condition: Fair  Diet recommendation: Low-sodium  Filed Weights   07/01/23 0423 07/02/23 0533 07/03/23 0438  Weight: 43.4 kg 41.6 kg 41 kg    History of present illness:  88/F w/ metastatic renal cell carcinoma, pulmonary mets, diastolic CHF, chronic hyponatremia, chronic anemia, hypertension presented to the ED with worsening dyspnea, orthopnea, edema -In the emergency room hypertensive, labs with sodium 121, creatinine 0.5, mag 1.4, BNP 509, troponin 9, hemoglobin 10.9, chest x-ray with bilateral pleural effusions -CT chest, no PE, moderate right pleural effusion, worsening moderately loculated irregular left pleural effusions, passive atelectasis of left lung with groundglass opacities, stable partially calcified right apical nodule and stable dilation of ascending aorta   -Venous duplex noted age-indeterminate DVT of right peroneal veins  -10/21 right thoracentesis 600 cc fluids removed.   Hospital Course:  Malignant right pleural effusion -Underwent thoracentesis 10/21, 600 mL of yellow fluid drained -Fluid exudative, cultures negative, cytology positive for malignant cells, poorly differentiated carcinoma -Prognosis is poor  with advanced age, frailty, severe hypoalbuminemia along with metastatic RCC, discussed with Dr.Mohamed, palliative consulted, discussed poor prognosis with patient's family, palliative care meeting completed, now DNR she will discharge home with outpatient palliative care follow-up, declines hospice at this time   Acute on chronic diastolic CHF (congestive heart failure) (HCC) -Echo EF 55%, no LVH, RV systolic function preserved, RVSP 25,8, moderate aortic valve regurgitation,  -Diuresed with IV Lasix and albumin Continue with spironolactone, Jardiance, metoprolol, a -Lasix changed to torsemide at discharge   Acute hypoxemic respiratory failure  -In the setting of malignant pleural effusion, pulmonary edema etc. -Improving, diuretics as above   Metastatic renal cell carcinoma -Known pleural-based metastasis in the left lung with hilar, mediastinal adenopathies -Had disease progression despite chemotherapy -Now with malignant pleural effusion affecting right lung as well -Currently on cabozantinib, followed by Dr. Shirline Frees -See discussion above, prognosis is poor   Acute/subacute deep vein thrombosis (DVT) of right lower extremity (HCC) Continue anticoagulation with apixaban.     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 Iron deficiency anemia. Continue iron supplementation.    Severe protein calorie malnutrition -Albumin is 2.0, poor prognostic factor, oral intake is poor,      Consultations: Palliative care, Dr. Shirline Frees  Discharge Exam: Vitals:   07/03/23 0800 07/03/23 1115  BP: (!) 142/78  129/65  Pulse: 72 70  Resp: 17 16  Temp: 97.8 F (36.6 C) 98.3 F (36.8 C)  SpO2: 95% 95%   General exam: Elderly cachectic female  laying in bed, AAOx3 HEENT: no JVD CVS: S1-S2, regular rhythm Lungs: Few basilar Rales otherwise clear Abdomen: Soft, nontender, bowel sounds present Extremities: trace edema Skin: No rashes Psychiatry:  Mood & affect appropriate.   Discharge Instructions    Allergies as of 07/03/2023   No Known Allergies      Medication List     STOP taking these medications    benazepril 20 MG tablet Commonly known as: LOTENSIN   celecoxib 100 MG capsule Commonly known as: CELEBREX   sodium chloride 1 g tablet       TAKE these medications    acetaminophen 325 MG tablet Commonly known as: TYLENOL Take 650 mg by mouth every 6 (six) hours as needed for headache.   amLODipine 10 MG tablet Commonly known as: NORVASC Take 5 mg by mouth daily.   apixaban 5 MG Tabs tablet Commonly known as: ELIQUIS Take 1 tablet (5 mg total) by mouth 2 (two) times daily.   Cabometyx 20 MG tablet Generic drug: cabozantinib Take 1 tablet (20 mg total) by mouth daily. Take on an empty stomach, 1 hour before or 2 hours after meals.   empagliflozin 10 MG Tabs tablet Commonly known as: JARDIANCE Take 1 tablet (10 mg total) by mouth daily. Start taking on: July 04, 2023   ferrous sulfate 325 (65 FE) MG EC tablet TAKE 1 TABLET BY MOUTH EVERY DAY WITH BREAKFAST What changed: See the new instructions.   hydrocortisone 2.5 % rectal cream Commonly known as: Anusol-HC Place 1 Application rectally 2 (two) times daily.   Magnesium 250 MG Tabs Take 1 tablet by mouth every evening.   metoprolol tartrate 50 MG tablet Commonly known as: LOPRESSOR Take 50 mg by mouth 2 (two) times daily.   mirtazapine 30 MG tablet Commonly known as: Remeron Take 1 tablet (30 mg total) by mouth at bedtime.   oxyCODONE-acetaminophen 5-325 MG tablet Commonly known as:  PERCOCET/ROXICET Take 1 tablet by mouth every 8 (eight) hours as needed for severe pain (pain score 7-10).   pantoprazole 40 MG tablet Commonly known as: PROTONIX Take 1 tablet (40 mg total) by mouth daily.   PRESERVISION AREDS 2 PO Take 1 tablet by mouth in the morning and at bedtime.   torsemide 20 MG tablet Commonly known as: DEMADEX Take 1 tablet (20 mg total) by mouth daily.   Valerian Root 100 MG Caps Take 1 capsule by mouth daily as needed (for anxiety).   Wheelchair Misc Light weight       No Known Allergies  Follow-up Information     Ollen Bowl, MD. Go on 07/15/2023.   Specialty: Internal Medicine Why: @12 :30pm Contact information: [address] [place] [address] [phone]                  The results of significant diagnostics from this hospitalization (including imaging, microbiology, ancillary and laboratory) are listed below for reference.    Significant Diagnostic Studies: VAS [place] LOWER EXTREMITY VENOUS (DVT) (ONLY [facility] & [facility])  Result Date: 06/30/2023  Lower Venous DVT Study Patient Name:  Gail Fitzgerald  Date of Exam:   06/29/2023 Medical Rec #: [id number]         Accession #:    [id number] Date of Birth: Nov 05, 1934         Patient Gender: F Patient Age:   66 years Exam Location:  [facility] Procedure:      VAS [place] LOWER EXTREMITY VENOUS (DVT) Referring Phys:  BROOKE SMALL --------------------------------------------------------------------------------  Indications: Swelling, and Edema. Other Indications: Patient positioning. Risk Factors: Cancer Hx past pregnancy. Limitations: Body habitus. Performing Technologist: Shona Simpson  Examination Guidelines: A complete evaluation includes B-mode imaging, spectral Doppler, color Doppler, and power Doppler as needed of all accessible portions of each vessel. Bilateral testing is considered an integral part of a complete examination. Limited examinations for reoccurring  indications may be performed as noted. The reflux portion of the exam is performed with the patient in reverse Trendelenburg.  +---------+---------------+---------+-----------+----------+-----------------+ RIGHT    CompressibilityPhasicitySpontaneityPropertiesThrombus Aging    +---------+---------------+---------+-----------+----------+-----------------+ CFV      Full           Yes      Yes                                    +---------+---------------+---------+-----------+----------+-----------------+ SFJ      Full                                                           +---------+---------------+---------+-----------+----------+-----------------+ FV Prox  Full                                                           +---------+---------------+---------+-----------+----------+-----------------+ FV Mid   Full                                                           +---------+---------------+---------+-----------+----------+-----------------+ FV DistalFull                                                           +---------+---------------+---------+-----------+----------+-----------------+ PFV      Full                                                           +---------+---------------+---------+-----------+----------+-----------------+ POP      Full           Yes      Yes                                    +---------+---------------+---------+-----------+----------+-----------------+ PTV      Full                                                           +---------+---------------+---------+-----------+----------+-----------------+  PERO     Partial                            dilated   Age Indeterminate +---------+---------------+---------+-----------+----------+-----------------+ 1/2 Peroneal veins seen to be partially thrombosed from the proximal to mid calf.  +----+---------------+---------+-----------+----------+--------------+  LEFTCompressibilityPhasicitySpontaneityPropertiesThrombus Aging +----+---------------+---------+-----------+----------+--------------+ CFV Full           Yes      Yes                                 +----+---------------+---------+-----------+----------+--------------+    Summary: RIGHT: - Findings consistent with age indeterminate deep vein thrombosis involving the right peroneal veins.  - No cystic structure found in the popliteal fossa.  LEFT: - No evidence of common femoral vein obstruction.   *See table(s) above for measurements and observations. Electronically signed by Gerarda Fraction on 06/30/2023 at 4:56:19 PM.    Final    DG Chest 1 View  Result Date: 06/30/2023 CLINICAL DATA:  Status post thoracentesis EXAM: CHEST  1 VIEW COMPARISON:  06/29/2023 chest x-ray and CT FINDINGS: Decreased right pleural effusion with small residual, no convincing pneumothorax. Similar extensive pleural and parenchymal metastatic disease in the left thorax. Stable cardiomediastinal silhouette. Aortic atherosclerosis. IMPRESSION: 1. Decreased right pleural effusion with small residual. No convincing pneumothorax. 2. Similar extensive pleural and parenchymal metastatic disease in the left thorax. Electronically Signed   By: Jasmine Pang M.D.   On: 06/30/2023 15:12   ECHOCARDIOGRAM COMPLETE  Result Date: 06/30/2023    ECHOCARDIOGRAM REPORT   Patient Name:   Gail Fitzgerald Date of Exam: 06/30/2023 Medical Rec #:  [id number]        Height:       61.0 in Accession #:    [id number]       Weight:       98.0 lb Date of Birth:  Feb 08, 1935        BSA:          1.395 m Patient Age:    88 years         BP:           156/75 mmHg Patient Gender: F                HR:           67 bpm. Exam Location:  Inpatient Procedure: 2D Echo, Color Doppler and Cardiac Doppler Indications:    CHF  History:        Patient has prior history of Echocardiogram examinations, most                 recent 03/11/2021. CHF, h/o DVT and renal cell  carcinoma; Risk                 Factors:Hypertension.  Sonographer:    Milbert Coulter Referring Phys: [id number] JUSTIN B HOWERTER IMPRESSIONS  1. Left ventricular ejection fraction, by estimation, is 55%. The left ventricle has normal function. The left ventricle has no regional wall motion abnormalities. Left ventricular diastolic parameters are consistent with Grade I diastolic dysfunction (impaired relaxation).  2. Right ventricular systolic function is normal. The right ventricular size is normal. There is normal pulmonary artery systolic pressure. The estimated right ventricular systolic pressure is 25.8 mmHg.  3. Left atrial size was mildly dilated.  4. The mitral valve is normal in structure. No evidence of mitral valve regurgitation.  No evidence of mitral stenosis.  5. The aortic valve is tricuspid. Aortic valve regurgitation is moderate. No aortic stenosis is present.  6. Aortic dilatation noted. There is mild dilatation of the ascending aorta, measuring 44 mm.  7. The inferior vena cava is normal in size with greater than 50% respiratory variability, suggesting right atrial pressure of 3 mmHg. FINDINGS  Left Ventricle: Left ventricular ejection fraction, by estimation, is 55%. The left ventricle has normal function. The left ventricle has no regional wall motion abnormalities. The left ventricular internal cavity size was normal in size. There is no left ventricular hypertrophy. Left ventricular diastolic parameters are consistent with Grade I diastolic dysfunction (impaired relaxation). Right Ventricle: The right ventricular size is normal. No increase in right ventricular wall thickness. Right ventricular systolic function is normal. There is normal pulmonary artery systolic pressure. The tricuspid regurgitant velocity is 2.39 m/s, and  with an assumed right atrial pressure of 3 mmHg, the estimated right ventricular systolic pressure is 25.8 mmHg. Left Atrium: Left atrial size was mildly dilated. Right  Atrium: Right atrial size was normal in size. Pericardium: Trivial pericardial effusion is present. Mitral Valve: The mitral valve is normal in structure. No evidence of mitral valve regurgitation. No evidence of mitral valve stenosis. Tricuspid Valve: The tricuspid valve is normal in structure. Tricuspid valve regurgitation is trivial. Aortic Valve: The aortic valve is tricuspid. Aortic valve regurgitation is moderate. Aortic regurgitation PHT measures 507 msec. No aortic stenosis is present. Aortic valve mean gradient measures 3.0 mmHg. Aortic valve peak gradient measures 4.8 mmHg. Aortic valve area, by VTI measures 2.94 cm. Pulmonic Valve: The pulmonic valve was normal in structure. Pulmonic valve regurgitation is trivial. Aorta: The aortic root is normal in size and structure and aortic dilatation noted. There is mild dilatation of the ascending aorta, measuring 44 mm. Venous: The inferior vena cava is normal in size with greater than 50% respiratory variability, suggesting right atrial pressure of 3 mmHg. IAS/Shunts: No atrial level shunt detected by color flow Doppler.  LEFT VENTRICLE PLAX 2D LVIDd:         3.50 cm   Diastology LVIDs:         2.90 cm   LV e' medial:    6.53 cm/s LV PW:         0.60 cm   LV E/e' medial:  11.3 LV IVS:        0.90 cm   LV e' lateral:   6.85 cm/s LVOT diam:     1.90 cm   LV E/e' lateral: 10.8 LV SV:         69 LV SV Index:   49 LVOT Area:     2.84 cm  RIGHT VENTRICLE RV Basal diam:  3.20 cm RV Mid diam:    2.70 cm RV S prime:     13.50 cm/s TAPSE (M-mode): 2.0 cm LEFT ATRIUM             Index        RIGHT ATRIUM           Index LA diam:        3.00 cm 2.15 cm/m   RA Area:     15.70 cm LA Vol (A2C):   49.3 ml 35.35 ml/m  RA Volume:   38.50 ml  27.60 ml/m LA Vol (A4C):   31.5 ml 22.58 ml/m LA Biplane Vol: 40.6 ml 29.11 ml/m  AORTIC VALVE AV Area (Vmax):    2.91 cm AV  Area (Vmean):   2.69 cm AV Area (VTI):     2.94 cm AV Vmax:           110.00 cm/s AV Vmean:           75.600 cm/s AV VTI:            0.233 m AV Peak Grad:      4.8 mmHg AV Mean Grad:      3.0 mmHg LVOT Vmax:         113.00 cm/s LVOT Vmean:        71.800 cm/s LVOT VTI:          0.242 m LVOT/AV VTI ratio: 1.04 AI PHT:            507 msec  AORTA Ao Root diam: 3.20 cm Ao Asc diam:  4.15 cm MITRAL VALVE               TRICUSPID VALVE MV Area (PHT): 3.27 cm    TR Peak grad:   22.8 mmHg MV Decel Time: 232 msec    TR Vmax:        239.00 cm/s MV E velocity: 73.90 cm/s MV A velocity: 89.60 cm/s  SHUNTS MV E/A ratio:  0.82        Systemic VTI:  0.24 m                            Systemic Diam: 1.90 cm Dalton McleanMD Electronically signed by Wilfred Lacy Signature Date/Time: 06/30/2023/2:40:32 PM    Final    IR THORACENTESIS ASP PLEURAL SPACE W/IMG GUIDE  Result Date: 06/30/2023 INDICATION: 88 year old female with shortness of breath, right pleural effusion. Request made for diagnostic and therapeutic thoracentesis. EXAM: ULTRASOUND GUIDED DIAGNOSTIC AND THERAPEUTIC RIGHT THORACENTESIS MEDICATIONS: 10 mL 1% lidocaine COMPLICATIONS: None immediate. PROCEDURE: An ultrasound guided thoracentesis was thoroughly discussed with the patient and questions answered. The benefits, risks, alternatives and complications were also discussed. The patient understands and wishes to proceed with the procedure. Written consent was obtained. Ultrasound was performed to localize and mark an adequate pocket of fluid in the right chest. The area was then prepped and draped in the normal sterile fashion. 1% Lidocaine was used for local anesthesia. Under ultrasound guidance a 6 Fr Safe-T-Centesis catheter was introduced. Thoracentesis was performed. The catheter was removed and a dressing applied. FINDINGS: A total of approximately 600 mL of yellow fluid was removed. Samples were sent to the laboratory as requested by the clinical team. IMPRESSION: Successful ultrasound guided right thoracentesis yielding 600 mL of pleural fluid. Performed by:  Loyce Dys PA-C Electronically Signed   By: Irish Lack M.D.   On: 06/30/2023 13:53   CT Angio Chest PE W and/or Wo Contrast  Result Date: 06/29/2023 CLINICAL DATA:  Pulmonary embolism (PE) suspected, high prob. Shortness of breath. Metastatic renal carcinoma EXAM: CT ANGIOGRAPHY CHEST WITH CONTRAST TECHNIQUE: Multidetector CT imaging of the chest was performed using the standard protocol during bolus administration of intravenous contrast. Multiplanar CT image reconstructions and MIPs were obtained to evaluate the vascular anatomy. RADIATION DOSE REDUCTION: This exam was performed according to the departmental dose-optimization program which includes automated exposure control, adjustment of the mA and/or kV according to patient size and/or use of iterative reconstruction technique. CONTRAST:  75mL OMNIPAQUE IOHEXOL 350 MG/ML SOLN COMPARISON:  CT chest abdomen pelvis 04/22/2023 FINDINGS: Cardiovascular: Satisfactory opacification of the pulmonary arteries to the segmental level. No  evidence of pulmonary embolism. Normal heart size. No significant pericardial effusion. Stable enlarged ascending thoracic aorta measuring up to 4.2 cm. The descending thoracic aorta is normal in caliber. No atherosclerotic plaque of the thoracic aorta. No coronary artery calcifications. Mediastinum/Nodes: No enlarged mediastinal, hilar, or axillary lymph nodes. Thyroid gland, trachea, and esophagus demonstrate no significant findings. Lungs/Pleura: Passive atelectasis of the left lung with ground-glass airspace opacities of the remaining portion of aerated left lungs. Stable partially calcified 1.3 x 0.9 cm right apical pulmonary nodule (7:18). No pulmonary mass. Interval increase in size of a moderate right pleural effusion. Interval worsening of moderate loculated irregular left pleural effusions. Irregular pleural effusions with likely underlying deposits appears to be invasive along the mediastinum. No pneumothorax.  Upper Abdomen: Markedly limited evaluation due to timing of contrast. Cholelithiasis. Severe atherosclerotic plaque. Musculoskeletal: No chest wall abnormality. Diffusely decreased bone density. Chronic appearing L1 vertebral body height loss. No suspicious lytic or blastic osseous lesions. No acute displaced fracture. Review of the MIP images confirms the above findings. IMPRESSION: 1. No pulmonary embolus. 2. Interval increase in size of a moderate right pleural effusion. 3. Interval worsening of moderate loculated irregular left pleural effusions. Irregular pleural effusions with likely underlying deposits appears to be invasive along the mediastinum. Markedly limited evaluation on this noncontrast study. 4. Passive atelectasis of the left lung with ground-glass airspace opacities of the remaining portion of aerated left lungs. 5. Stable partially calcified 1.3 x 0.9 cm right apical pulmonary nodule 6. Stable aneurysmal ascending thoracic aorta (4.2 cm.) Recommend annual imaging followup by CTA or MRA. This recommendation follows 2010 ACCF/AHA/AATS/ACR/ASA/SCA/SCAI/SIR/STS/SVM Guidelines for the Diagnosis and Management of Patients with Thoracic Aortic Disease. Circulation. 2010; 121: Z610-R604. Aortic aneurysm NOS (ICD10-I71.9). 7.  Aortic Atherosclerosis (ICD10-I70.0). 8. Cholelithiasis. Electronically Signed   By: Tish Frederickson M.D.   On: 06/29/2023 20:49   DG Chest 2 View  Result Date: 06/29/2023 CLINICAL DATA:  Shortness of breath. Metastatic renal cell carcinoma EXAM: CHEST - 2 VIEW COMPARISON:  04/22/2023 FINDINGS: Stable heart size. Aortic atherosclerosis. Bilateral pleural effusions with prominent masslike left pleural thickening compatible with known metastatic disease. Patchy airspace opacities throughout the left lung. Milder interstitial opacities within the right lung. No pneumothorax. Chronic mild wedge compression deformity of L1. IMPRESSION: 1. Bilateral pleural effusions with prominent  masslike left pleural thickening compatible with known metastatic disease. 2. Patchy airspace opacities throughout the left lung and milder interstitial opacities within the right lung, which may represent asymmetric edema or infection. Electronically Signed   By: Duanne Guess D.O.   On: 06/29/2023 19:37    Microbiology: Recent Results (from the past 240 hour(s))  Body fluid culture w Gram Stain     Status: None   Collection Time: 06/30/23 12:39 PM   Specimen: Lung, Right; Pleural Fluid  Result Value Ref Range Status   Specimen Description PLEURAL  Final   Special Requests LUNG, RIGHT  Final   Gram Stain   Final    RARE WBC PRESENT,BOTH PMN AND MONONUCLEAR NO ORGANISMS SEEN    Culture   Final    NO GROWTH 3 DAYS Performed at [facility], [address] [place], [place] [address]    Report Status 07/03/2023 FINAL  Final     Labs: Basic Metabolic Panel: Recent Labs  Lab 06/29/23 1736 06/30/23 0243 07/01/23 0355 07/02/23 0257 07/02/23 1521 07/03/23 0330  NA  --  122* 120* 121* 121* 122*  K  --  3.7 4.2 3.5 3.9  4.2  CL  --  80* 80* 79* 79* 80*  CO2  --  28 28 30 30 31   GLUCOSE  --  86 79 93 124* 88  BUN  --  9 10 9 13 9   CREATININE  --  0.40* 0.52 0.50 0.55 0.50  CALCIUM  --  8.0* 7.7* 7.8* 7.9* 8.6*  MG 1.4* 1.7 2.1  --   --   --   PHOS  --  3.2  --   --   --   --    Liver Function Tests: Recent Labs  Lab 06/29/23 1636 06/30/23 0243  AST 16 16  ALT 9 8  ALKPHOS 82 75  BILITOT 0.8 0.8  PROT 5.8* 5.3*  ALBUMIN 2.2* 2.0*   No results for input(s): "LIPASE", "AMYLASE" in the last 168 hours. No results for input(s): "AMMONIA" in the last 168 hours. CBC: Recent Labs  Lab 06/29/23 1636 06/30/23 0243 07/03/23 0330  WBC 8.4 6.5 10.5  NEUTROABS 6.7 5.1  --   HGB 10.9* 10.6* 11.3*  HCT 33.3* 32.3* 33.9*  MCV 87.9 86.4 87.8  PLT 322 287 274   Cardiac Enzymes: No results for input(s): "CKTOTAL", "CKMB", "CKMBINDEX", "TROPONINI" in the last 168  hours. BNP: BNP (last 3 results) Recent Labs    06/29/23 1636  BNP 509.0*    ProBNP (last 3 results) No results for input(s): "PROBNP" in the last 8760 hours.  CBG: No results for input(s): "GLUCAP" in the last 168 hours.     Signed:  Zannie Cove MD.  [facility] 07/03/2023, 12:07 PM

## 2023-07-03 NOTE — Telephone Encounter (Signed)
Pharmacy Patient Advocate Encounter  Received notification from [facility] that Prior Authorization for Jardiance 10MG  tablets has been APPROVED from 07/03/2023 to 07/02/2024   PA #/Case ID/Reference #: [id number]  Key: [id number]

## 2023-07-07 ENCOUNTER — Telehealth: Payer: Self-pay | Admitting: Medical Oncology

## 2023-07-07 NOTE — Telephone Encounter (Signed)
Luva notified of nov appt for CT scan( just abd/pelvis per [facility] )

## 2023-07-11 ENCOUNTER — Other Ambulatory Visit: Payer: Self-pay

## 2023-07-11 ENCOUNTER — Other Ambulatory Visit: Payer: Self-pay | Admitting: Internal Medicine

## 2023-07-11 DIAGNOSIS — C3490 Malignant neoplasm of unspecified part of unspecified bronchus or lung: Secondary | ICD-10-CM

## 2023-07-11 MED ORDER — CABOMETYX 20 MG PO TABS
20.0000 mg | ORAL_TABLET | Freq: Every day | ORAL | 0 refills | Status: DC
  Filled 2023-07-11: qty 30, 30d supply, fill #0

## 2023-07-11 NOTE — Progress Notes (Signed)
Specialty Pharmacy Refill Coordination Note  Gail Fitzgerald is a 88 y.o. female contacted today regarding refills of specialty medication(s) Cabozantinib S-Malate (Antineoplastic Enzyme Inhibitors) Spoke with patient's daughter-in-law.  Patient requested Delivery   Delivery date: 07/18/23   Verified address: [address]   Medication will be filled on 07/17/23.  Refill request pending.

## 2023-07-14 ENCOUNTER — Ambulatory Visit (HOSPITAL_COMMUNITY)
Admission: RE | Admit: 2023-07-14 | Discharge: 2023-07-14 | Disposition: A | Discharge status: Home / Self Care | Payer: Medicaid Other | Source: Ambulatory Visit | Attending: Internal Medicine | Admitting: Internal Medicine

## 2023-07-14 DIAGNOSIS — C642 Malignant neoplasm of left kidney, except renal pelvis: Secondary | ICD-10-CM | POA: Diagnosis present

## 2023-07-18 NOTE — Progress Notes (Unsigned)
[facility] OFFICE PROGRESS NOTE  Pa, [facility] [address] [place] [address]  DIAGNOSIS: Metastatic renal cell carcinoma presented with large left renal mass in addition to significant left hemothorax pleural-based metastasis as well as left hilar and mediastinal lymphadenopathy diagnosed in August 2022.    Biomarker Findings Microsatellite status - MS-Stable Tumor Mutational Burden - 2 Muts/Mb Genomic Findings For a complete list of the genes assayed, please refer to the Appendix. MTAP loss CDKN2A/B CDKN2A loss 8 Disease relevant genes with no reportable Alteration   PD-L1 expression 0%  PRIOR THERAPY: Systemic immunotherapy with ipilimumab 1 mg/KG in addition to nivolumab 3 mg/KG every 3 weeks for 4 cycles followed by maintenance treatment with nivolumab. Status post 3 cycles. First dose started on 05/22/2021. This treatment was discontinued secondary to disease progression     CURRENT THERAPY: Opdivo 480 Mg IV every 4 weeks with Cabometyx 40 mg p.o. daily.  First dose August 21, 2021.  Status post 26 cycles of treatment.  Her dose of Cabometyx will be reduced to 20 mg p.o. daily starting 01/08/2022 secondary to persistent diarrhea.   INTERVAL HISTORY: Gail Fitzgerald 88 y.o. female returns      MEDICAL HISTORY: Past Medical History:  Diagnosis Date   Hypertension    Renal cell carcinoma (HCC)    With mets to the lungs    ALLERGIES:  has No Known Allergies.  MEDICATIONS:  Current Outpatient Medications  Medication Sig Dispense Refill   acetaminophen (TYLENOL) 325 MG tablet Take 650 mg by mouth every 6 (six) hours as needed for headache.     amLODipine (NORVASC) 10 MG tablet Take 5 mg by mouth daily.     apixaban (ELIQUIS) 5 MG TABS tablet Take 1 tablet (5 mg total) by mouth 2 (two) times daily. 60 tablet 0   cabozantinib (CABOMETYX) 20 MG tablet Take 1 tablet (20 mg total) by mouth daily. Take on an empty  stomach, 1 hour before or 2 hours after meals. 30 tablet 0   empagliflozin (JARDIANCE) 10 MG TABS tablet Take 1 tablet (10 mg total) by mouth daily. 30 tablet 0   ferrous sulfate 325 (65 FE) MG EC tablet TAKE 1 TABLET BY MOUTH EVERY DAY WITH BREAKFAST (Patient taking differently: Take 325 mg by mouth daily with breakfast.) 90 tablet 1   hydrocortisone (ANUSOL-HC) 2.5 % rectal cream Place 1 Application rectally 2 (two) times daily. 30 g 0   Magnesium 250 MG TABS Take 1 tablet by mouth every evening.     metoprolol tartrate (LOPRESSOR) 50 MG tablet Take 50 mg by mouth 2 (two) times daily.     mirtazapine (REMERON) 30 MG tablet Take 1 tablet (30 mg total) by mouth at bedtime. 30 tablet 2   Misc. Devices (WHEELCHAIR) MISC Light weight     Multiple Vitamins-Minerals (PRESERVISION AREDS 2 PO) Take 1 tablet by mouth in the morning and at bedtime.     oxyCODONE-acetaminophen (PERCOCET/ROXICET) 5-325 MG tablet Take 1 tablet by mouth every 8 (eight) hours as needed for severe pain (pain score 7-10). 30 tablet 0   pantoprazole (PROTONIX) 40 MG tablet Take 1 tablet (40 mg total) by mouth daily. 90 tablet 1   torsemide (DEMADEX) 20 MG tablet Take 1 tablet (20 mg total) by mouth daily. 30 tablet 0   Valerian Root 100 MG CAPS Take 1 capsule by mouth daily as needed (for anxiety).     No current facility-administered medications for this  visit.    SURGICAL HISTORY:  Past Surgical History:  Procedure Laterality Date   IR THORACENTESIS ASP PLEURAL SPACE W/IMG GUIDE  04/05/2021   IR THORACENTESIS ASP PLEURAL SPACE W/IMG GUIDE  06/30/2023   THORACENTESIS N/A 04/12/2021   Procedure: Alanson Puls;  Surgeon: Josephine Igo, DO;  Location: [facility];  Service: Pulmonary;  Laterality: N/A;    REVIEW OF SYSTEMS:   Review of Systems  Constitutional: Negative for appetite change, chills, fatigue, fever and unexpected weight change.  HENT:   Negative for mouth sores, nosebleeds, sore throat and trouble  swallowing.   Eyes: Negative for eye problems and icterus.  Respiratory: Negative for cough, hemoptysis, shortness of breath and wheezing.   Cardiovascular: Negative for chest pain and leg swelling.  Gastrointestinal: Negative for abdominal pain, constipation, diarrhea, nausea and vomiting.  Genitourinary: Negative for bladder incontinence, difficulty urinating, dysuria, frequency and hematuria.   Musculoskeletal: Negative for back pain, gait problem, neck pain and neck stiffness.  Skin: Negative for itching and rash.  Neurological: Negative for dizziness, extremity weakness, gait problem, headaches, light-headedness and seizures.  Hematological: Negative for adenopathy. Does not bruise/bleed easily.  Psychiatric/Behavioral: Negative for confusion, depression and sleep disturbance. The patient is not nervous/anxious.     PHYSICAL EXAMINATION:  There were no vitals taken for this visit.  ECOG PERFORMANCE STATUS: {CHL ONC ECOG [id number]  Physical Exam  Constitutional: Oriented to person, place, and time and well-developed, well-nourished, and in no distress. No distress.  HENT:  Head: Normocephalic and atraumatic.  Mouth/Throat: Oropharynx is clear and moist. No oropharyngeal exudate.  Eyes: Conjunctivae are normal. Right eye exhibits no discharge. Left eye exhibits no discharge. No scleral icterus.  Neck: Normal range of motion. Neck supple.  Cardiovascular: Normal rate, regular rhythm, normal heart sounds and intact distal pulses.   Pulmonary/Chest: Effort normal and breath sounds normal. No respiratory distress. No wheezes. No rales.  Abdominal: Soft. Bowel sounds are normal. Exhibits no distension and no mass. There is no tenderness.  Musculoskeletal: Normal range of motion. Exhibits no edema.  Lymphadenopathy:    No cervical adenopathy.  Neurological: Alert and oriented to person, place, and time. Exhibits normal muscle tone. Gait normal. Coordination normal.  Skin: Skin is  warm and dry. No rash noted. Not diaphoretic. No erythema. No pallor.  Psychiatric: Mood, memory and judgment normal.  Vitals reviewed.  LABORATORY DATA: Lab Results  Component Value Date   WBC 10.5 07/03/2023   HGB 11.3 (L) 07/03/2023   HCT 33.9 (L) 07/03/2023   MCV 87.8 07/03/2023   PLT 274 07/03/2023      Chemistry      Component Value Date/Time   NA 122 (L) 07/03/2023 0330   K 4.2 07/03/2023 0330   CL 80 (L) 07/03/2023 0330   CO2 31 07/03/2023 0330   BUN 9 07/03/2023 0330   CREATININE 0.50 07/03/2023 0330   CREATININE 0.43 (L) 06/24/2023 0901      Component Value Date/Time   CALCIUM 8.6 (L) 07/03/2023 0330   ALKPHOS 75 06/30/2023 0243   AST 16 06/30/2023 0243   AST 14 (L) 06/24/2023 0901   ALT 8 06/30/2023 0243   ALT 5 06/24/2023 0901   BILITOT 0.8 06/30/2023 0243   BILITOT 0.4 06/24/2023 0901       RADIOGRAPHIC STUDIES:  VAS [place] LOWER EXTREMITY VENOUS (DVT) (ONLY [facility] & [facility])  Result Date: 06/30/2023  Lower Venous DVT Study Patient Name:  ANJELI SCHLESSER  Date of Exam:   06/29/2023 Medical Rec #:  [id number]         Accession #:    [id number] Date of Birth: [birth year]         Patient Gender: F Patient Age:   53 years Exam Location[facility]  [facility] Procedure:      VAS [place] LOWER EXTREMITY VENOUS (DVT) Referring Phys: Nehemiah Settle SMALL --------------------------------------------------------------------------------  Indications: Swelling, and Edema. Other Indications: Patient positioning. Risk Factors: Cancer Hx past pregnancy. Limitations: Body habitus. Performing Technologist: Shona Simpson  Examination Guidelines: A complete evaluation includes B-mode imaging, spectral Doppler, color Doppler, and power Doppler as needed of all accessible portions of each vessel. Bilateral testing is considered an integral part of a complete examination. Limited examinations for reoccurring indications may be performed as noted. The reflux portion of the exam is performed with the patient in  reverse Trendelenburg.  +---------+---------------+---------+-----------+----------+-----------------+ RIGHT    CompressibilityPhasicitySpontaneityPropertiesThrombus Aging    +---------+---------------+---------+-----------+----------+-----------------+ CFV      Full           Yes      Yes                                    +---------+---------------+---------+-----------+----------+-----------------+ SFJ      Full                                                           +---------+---------------+---------+-----------+----------+-----------------+ FV Prox  Full                                                           +---------+---------------+---------+-----------+----------+-----------------+ FV Mid   Full                                                           +---------+---------------+---------+-----------+----------+-----------------+ FV DistalFull                                                           +---------+---------------+---------+-----------+----------+-----------------+ PFV      Full                                                           +---------+---------------+---------+-----------+----------+-----------------+ POP      Full           Yes      Yes                                    +---------+---------------+---------+-----------+----------+-----------------+ PTV  Full                                                           +---------+---------------+---------+-----------+----------+-----------------+ PERO     Partial                            dilated   Age Indeterminate +---------+---------------+---------+-----------+----------+-----------------+ 1/2 Peroneal veins seen to be partially thrombosed from the proximal to mid calf.  +----+---------------+---------+-----------+----------+--------------+ LEFTCompressibilityPhasicitySpontaneityPropertiesThrombus Aging  +----+---------------+---------+-----------+----------+--------------+ CFV Full           Yes      Yes                                 +----+---------------+---------+-----------+----------+--------------+    Summary: RIGHT: - Findings consistent with age indeterminate deep vein thrombosis involving the right peroneal veins.  - No cystic structure found in the popliteal fossa.  LEFT: - No evidence of common femoral vein obstruction.   *See table(s) above for measurements and observations. Electronically signed by Gerarda Fraction on 06/30/2023 at 4:56:19 PM.    Final    DG Chest 1 View  Result Date: 06/30/2023 CLINICAL DATA:  Status post thoracentesis EXAM: CHEST  1 VIEW COMPARISON:  06/29/2023 chest x-ray and CT FINDINGS: Decreased right pleural effusion with small residual, no convincing pneumothorax. Similar extensive pleural and parenchymal metastatic disease in the left thorax. Stable cardiomediastinal silhouette. Aortic atherosclerosis. IMPRESSION: 1. Decreased right pleural effusion with small residual. No convincing pneumothorax. 2. Similar extensive pleural and parenchymal metastatic disease in the left thorax. Electronically Signed   By: Jasmine Pang M.D.   On: 06/30/2023 15:12   ECHOCARDIOGRAM COMPLETE  Result Date: 06/30/2023    ECHOCARDIOGRAM REPORT   Patient Name:   Gail Fitzgerald Date of Exam: 06/30/2023 Medical Rec #:  [id number]        Height:       61.0 in Accession #:    [id number]       Weight:       98.0 lb Date of Birth:  Aug 08, 1935        BSA:          1.395 m Patient Age:    88 years         BP:           156/75 mmHg Patient Gender: F                HR:           67 bpm. Exam Location:  Inpatient Procedure: 2D Echo, Color Doppler and Cardiac Doppler Indications:    CHF  History:        Patient has prior history of Echocardiogram examinations, most                 recent 03/11/2021. CHF, h/o DVT and renal cell carcinoma; Risk                 Factors:Hypertension.  Sonographer:     Milbert Coulter Referring Phys: [id number] JUSTIN B HOWERTER IMPRESSIONS  1. Left ventricular ejection fraction, by estimation, is 55%. The left ventricle has normal function. The left ventricle has no regional wall motion abnormalities. Left ventricular diastolic parameters are consistent  with Grade I diastolic dysfunction (impaired relaxation).  2. Right ventricular systolic function is normal. The right ventricular size is normal. There is normal pulmonary artery systolic pressure. The estimated right ventricular systolic pressure is 25.8 mmHg.  3. Left atrial size was mildly dilated.  4. The mitral valve is normal in structure. No evidence of mitral valve regurgitation. No evidence of mitral stenosis.  5. The aortic valve is tricuspid. Aortic valve regurgitation is moderate. No aortic stenosis is present.  6. Aortic dilatation noted. There is mild dilatation of the ascending aorta, measuring 44 mm.  7. The inferior vena cava is normal in size with greater than 50% respiratory variability, suggesting right atrial pressure of 3 mmHg. FINDINGS  Left Ventricle: Left ventricular ejection fraction, by estimation, is 55%. The left ventricle has normal function. The left ventricle has no regional wall motion abnormalities. The left ventricular internal cavity size was normal in size. There is no left ventricular hypertrophy. Left ventricular diastolic parameters are consistent with Grade I diastolic dysfunction (impaired relaxation). Right Ventricle: The right ventricular size is normal. No increase in right ventricular wall thickness. Right ventricular systolic function is normal. There is normal pulmonary artery systolic pressure. The tricuspid regurgitant velocity is 2.39 m/s, and  with an assumed right atrial pressure of 3 mmHg, the estimated right ventricular systolic pressure is 25.8 mmHg. Left Atrium: Left atrial size was mildly dilated. Right Atrium: Right atrial size was normal in size. Pericardium: Trivial  pericardial effusion is present. Mitral Valve: The mitral valve is normal in structure. No evidence of mitral valve regurgitation. No evidence of mitral valve stenosis. Tricuspid Valve: The tricuspid valve is normal in structure. Tricuspid valve regurgitation is trivial. Aortic Valve: The aortic valve is tricuspid. Aortic valve regurgitation is moderate. Aortic regurgitation PHT measures 507 msec. No aortic stenosis is present. Aortic valve mean gradient measures 3.0 mmHg. Aortic valve peak gradient measures 4.8 mmHg. Aortic valve area, by VTI measures 2.94 cm. Pulmonic Valve: The pulmonic valve was normal in structure. Pulmonic valve regurgitation is trivial. Aorta: The aortic root is normal in size and structure and aortic dilatation noted. There is mild dilatation of the ascending aorta, measuring 44 mm. Venous: The inferior vena cava is normal in size with greater than 50% respiratory variability, suggesting right atrial pressure of 3 mmHg. IAS/Shunts: No atrial level shunt detected by color flow Doppler.  LEFT VENTRICLE PLAX 2D LVIDd:         3.50 cm   Diastology LVIDs:         2.90 cm   LV e' medial:    6.53 cm/s LV PW:         0.60 cm   LV E/e' medial:  11.3 LV IVS:        0.90 cm   LV e' lateral:   6.85 cm/s LVOT diam:     1.90 cm   LV E/e' lateral: 10.8 LV SV:         69 LV SV Index:   49 LVOT Area:     2.84 cm  RIGHT VENTRICLE RV Basal diam:  3.20 cm RV Mid diam:    2.70 cm RV S prime:     13.50 cm/s TAPSE (M-mode): 2.0 cm LEFT ATRIUM             Index        RIGHT ATRIUM           Index LA diam:        3.00  cm 2.15 cm/m   RA Area:     15.70 cm LA Vol (A2C):   49.3 ml 35.35 ml/m  RA Volume:   38.50 ml  27.60 ml/m LA Vol (A4C):   31.5 ml 22.58 ml/m LA Biplane Vol: 40.6 ml 29.11 ml/m  AORTIC VALVE AV Area (Vmax):    2.91 cm AV Area (Vmean):   2.69 cm AV Area (VTI):     2.94 cm AV Vmax:           110.00 cm/s AV Vmean:          75.600 cm/s AV VTI:            0.233 m AV Peak Grad:      4.8 mmHg AV  Mean Grad:      3.0 mmHg LVOT Vmax:         113.00 cm/s LVOT Vmean:        71.800 cm/s LVOT VTI:          0.242 m LVOT/AV VTI ratio: 1.04 AI PHT:            507 msec  AORTA Ao Root diam: 3.20 cm Ao Asc diam:  4.15 cm MITRAL VALVE               TRICUSPID VALVE MV Area (PHT): 3.27 cm    TR Peak grad:   22.8 mmHg MV Decel Time: 232 msec    TR Vmax:        239.00 cm/s MV E velocity: 73.90 cm/s MV A velocity: 89.60 cm/s  SHUNTS MV E/A ratio:  0.82        Systemic VTI:  0.24 m                            Systemic Diam: 1.90 cm Dalton McleanMD Electronically signed by Wilfred Lacy Signature Date/Time: 06/30/2023/2:40:32 PM    Final    IR THORACENTESIS ASP PLEURAL SPACE W/IMG GUIDE  Result Date: 06/30/2023 INDICATION: 88 year old female with shortness of breath, right pleural effusion. Request made for diagnostic and therapeutic thoracentesis. EXAM: ULTRASOUND GUIDED DIAGNOSTIC AND THERAPEUTIC RIGHT THORACENTESIS MEDICATIONS: 10 mL 1% lidocaine COMPLICATIONS: None immediate. PROCEDURE: An ultrasound guided thoracentesis was thoroughly discussed with the patient and questions answered. The benefits, risks, alternatives and complications were also discussed. The patient understands and wishes to proceed with the procedure. Written consent was obtained. Ultrasound was performed to localize and mark an adequate pocket of fluid in the right chest. The area was then prepped and draped in the normal sterile fashion. 1% Lidocaine was used for local anesthesia. Under ultrasound guidance a 6 Fr Safe-T-Centesis catheter was introduced. Thoracentesis was performed. The catheter was removed and a dressing applied. FINDINGS: A total of approximately 600 mL of yellow fluid was removed. Samples were sent to the laboratory as requested by the clinical team. IMPRESSION: Successful ultrasound guided right thoracentesis yielding 600 mL of pleural fluid. Performed by: Loyce Dys PA-C Electronically Signed   By: Irish Lack M.D.    On: 06/30/2023 13:53   CT Angio Chest PE W and/or Wo Contrast  Result Date: 06/29/2023 CLINICAL DATA:  Pulmonary embolism (PE) suspected, high prob. Shortness of breath. Metastatic renal carcinoma EXAM: CT ANGIOGRAPHY CHEST WITH CONTRAST TECHNIQUE: Multidetector CT imaging of the chest was performed using the standard protocol during bolus administration of intravenous contrast. Multiplanar CT image reconstructions and MIPs were obtained to evaluate the vascular anatomy. RADIATION DOSE  REDUCTION: This exam was performed according to the departmental dose-optimization program which includes automated exposure control, adjustment of the mA and/or kV according to patient size and/or use of iterative reconstruction technique. CONTRAST:  75mL OMNIPAQUE IOHEXOL 350 MG/ML SOLN COMPARISON:  CT chest abdomen pelvis 04/22/2023 FINDINGS: Cardiovascular: Satisfactory opacification of the pulmonary arteries to the segmental level. No evidence of pulmonary embolism. Normal heart size. No significant pericardial effusion. Stable enlarged ascending thoracic aorta measuring up to 4.2 cm. The descending thoracic aorta is normal in caliber. No atherosclerotic plaque of the thoracic aorta. No coronary artery calcifications. Mediastinum/Nodes: No enlarged mediastinal, hilar, or axillary lymph nodes. Thyroid gland, trachea, and esophagus demonstrate no significant findings. Lungs/Pleura: Passive atelectasis of the left lung with ground-glass airspace opacities of the remaining portion of aerated left lungs. Stable partially calcified 1.3 x 0.9 cm right apical pulmonary nodule (7:18). No pulmonary mass. Interval increase in size of a moderate right pleural effusion. Interval worsening of moderate loculated irregular left pleural effusions. Irregular pleural effusions with likely underlying deposits appears to be invasive along the mediastinum. No pneumothorax. Upper Abdomen: Markedly limited evaluation due to timing of contrast.  Cholelithiasis. Severe atherosclerotic plaque. Musculoskeletal: No chest wall abnormality. Diffusely decreased bone density. Chronic appearing L1 vertebral body height loss. No suspicious lytic or blastic osseous lesions. No acute displaced fracture. Review of the MIP images confirms the above findings. IMPRESSION: 1. No pulmonary embolus. 2. Interval increase in size of a moderate right pleural effusion. 3. Interval worsening of moderate loculated irregular left pleural effusions. Irregular pleural effusions with likely underlying deposits appears to be invasive along the mediastinum. Markedly limited evaluation on this noncontrast study. 4. Passive atelectasis of the left lung with ground-glass airspace opacities of the remaining portion of aerated left lungs. 5. Stable partially calcified 1.3 x 0.9 cm right apical pulmonary nodule 6. Stable aneurysmal ascending thoracic aorta (4.2 cm.) Recommend annual imaging followup by CTA or MRA. This recommendation follows 2010 ACCF/AHA/AATS/ACR/ASA/SCA/SCAI/SIR/STS/SVM Guidelines for the Diagnosis and Management of Patients with Thoracic Aortic Disease. Circulation. 2010; 121: Z308-M578. Aortic aneurysm NOS (ICD10-I71.9). 7.  Aortic Atherosclerosis (ICD10-I70.0). 8. Cholelithiasis. Electronically Signed   By: Tish Frederickson M.D.   On: 06/29/2023 20:49   DG Chest 2 View  Result Date: 06/29/2023 CLINICAL DATA:  Shortness of breath. Metastatic renal cell carcinoma EXAM: CHEST - 2 VIEW COMPARISON:  04/22/2023 FINDINGS: Stable heart size. Aortic atherosclerosis. Bilateral pleural effusions with prominent masslike left pleural thickening compatible with known metastatic disease. Patchy airspace opacities throughout the left lung. Milder interstitial opacities within the right lung. No pneumothorax. Chronic mild wedge compression deformity of L1. IMPRESSION: 1. Bilateral pleural effusions with prominent masslike left pleural thickening compatible with known metastatic  disease. 2. Patchy airspace opacities throughout the left lung and milder interstitial opacities within the right lung, which may represent asymmetric edema or infection. Electronically Signed   By: Duanne Guess D.O.   On: 06/29/2023 19:37     ASSESSMENT/PLAN:  No problem-specific Assessment & Plan notes found for this encounter.   No orders of the defined types were placed in this encounter.    I spent {CHL ONC TIME VISIT - IONGE:[id number]} counseling the patient face to face. The total time spent in the appointment was {CHL ONC TIME VISIT - WNUUV:[id number]}.  Braya Habermehl L Rieley Khalsa, PA-C 07/18/23

## 2023-07-19 ENCOUNTER — Ambulatory Visit (HOSPITAL_COMMUNITY)
Admission: RE | Admit: 2023-07-19 | Discharge: 2023-07-19 | Disposition: A | Discharge status: Home / Self Care | Payer: Medicaid Other | Source: Ambulatory Visit | Attending: Internal Medicine | Admitting: Internal Medicine

## 2023-07-19 DIAGNOSIS — C642 Malignant neoplasm of left kidney, except renal pelvis: Secondary | ICD-10-CM | POA: Diagnosis present

## 2023-07-19 MED ORDER — GADOBUTROL 1 MMOL/ML IV SOLN: 9.0000 mL | Freq: Once | INTRAVENOUS | Status: DC | PRN

## 2023-07-19 MED ORDER — GADOBUTROL 1 MMOL/ML IV SOLN
4.0000 mL | Freq: Once | INTRAVENOUS | Status: AC | PRN
  Administered 2023-07-19: 4 mL via INTRAVENOUS

## 2023-07-22 ENCOUNTER — Other Ambulatory Visit (HOSPITAL_COMMUNITY): Payer: Self-pay

## 2023-07-22 ENCOUNTER — Telehealth: Payer: Self-pay | Admitting: Pharmacist

## 2023-07-22 ENCOUNTER — Inpatient Hospital Stay: Payer: Medicaid Other | Attending: Internal Medicine

## 2023-07-22 ENCOUNTER — Telehealth: Payer: Self-pay

## 2023-07-22 ENCOUNTER — Inpatient Hospital Stay: Payer: Medicaid Other | Admitting: Physician Assistant

## 2023-07-22 ENCOUNTER — Inpatient Hospital Stay: Payer: Medicaid Other | Admitting: Dietician

## 2023-07-22 VITALS — BP 111/72 | HR 96 | Temp 97.9°F | Resp 16 | Wt 88.3 lb

## 2023-07-22 DIAGNOSIS — Z7189 Other specified counseling: Secondary | ICD-10-CM | POA: Diagnosis not present

## 2023-07-22 DIAGNOSIS — G893 Neoplasm related pain (acute) (chronic): Secondary | ICD-10-CM

## 2023-07-22 DIAGNOSIS — C782 Secondary malignant neoplasm of pleura: Secondary | ICD-10-CM | POA: Insufficient documentation

## 2023-07-22 DIAGNOSIS — C642 Malignant neoplasm of left kidney, except renal pelvis: Secondary | ICD-10-CM | POA: Diagnosis present

## 2023-07-22 DIAGNOSIS — I5033 Acute on chronic diastolic (congestive) heart failure: Secondary | ICD-10-CM

## 2023-07-22 LAB — CMP (CANCER CENTER ONLY)
ALT: 8 U/L (ref 0–44)
AST: 17 U/L (ref 15–41)
Albumin: 3.1 g/dL — ABNORMAL LOW (ref 3.5–5.0)
Alkaline Phosphatase: 80 U/L (ref 38–126)
Anion gap: 11 (ref 5–15)
BUN: 24 mg/dL — ABNORMAL HIGH (ref 8–23)
CO2: 31 mmol/L (ref 22–32)
Calcium: 8.6 mg/dL — ABNORMAL LOW (ref 8.9–10.3)
Chloride: 90 mmol/L — ABNORMAL LOW (ref 98–111)
Creatinine: 0.67 mg/dL (ref 0.44–1.00)
GFR, Estimated: >60 mL/min (ref >60)
Glucose, Bld: 103 mg/dL — ABNORMAL HIGH (ref 70–99)
Potassium: 3.8 mmol/L (ref 3.5–5.1)
Sodium: 132 mmol/L — ABNORMAL LOW (ref 135–145)
Total Bilirubin: 0.5 mg/dL (ref <1.2)
Total Protein: 6.2 g/dL — ABNORMAL LOW (ref 6.5–8.1)

## 2023-07-22 LAB — CBC WITH DIFFERENTIAL (CANCER CENTER ONLY)
Abs Immature Granulocytes: 0.04 10*3/uL (ref 0.00–0.07)
Basophils Absolute: 0 10*3/uL (ref 0.0–0.1)
Basophils Relative: 0 %
Eosinophils Absolute: 0 10*3/uL (ref 0.0–0.5)
Eosinophils Relative: 0 %
HCT: 34.2 % — ABNORMAL LOW (ref 36.0–46.0)
Hemoglobin: 11 g/dL — ABNORMAL LOW (ref 12.0–15.0)
Immature Granulocytes: 1 %
Lymphocytes Relative: 7 %
Lymphs Abs: 0.6 10*3/uL — ABNORMAL LOW (ref 0.7–4.0)
MCH: 29.3 pg (ref 26.0–34.0)
MCHC: 32.2 g/dL (ref 30.0–36.0)
MCV: 91 fL (ref 80.0–100.0)
Monocytes Absolute: 1.1 10*3/uL — ABNORMAL HIGH (ref 0.1–1.0)
Monocytes Relative: 13 %
Neutro Abs: 6.7 10*3/uL (ref 1.7–7.7)
Neutrophils Relative %: 79 %
Platelet Count: 410 10*3/uL — ABNORMAL HIGH (ref 150–400)
RBC: 3.76 MIL/uL — ABNORMAL LOW (ref 3.87–5.11)
RDW: 14.4 % (ref 11.5–15.5)
WBC Count: 8.4 10*3/uL (ref 4.0–10.5)
nRBC: 0 % (ref 0.0–0.2)

## 2023-07-22 LAB — TSH: TSH: 2.832 u[IU]/mL (ref 0.350–4.500)

## 2023-07-22 MED ORDER — HYDROCODONE-ACETAMINOPHEN 5-325 MG PO TABS: 1.0000 | ORAL_TABLET | Freq: Four times a day (QID) | ORAL | 0 refills | Status: DC | PRN

## 2023-07-22 NOTE — Telephone Encounter (Signed)
Per Cassie PA- Sent in Norco to pharmacy instead of Tramadol due to drug interaction. Pt verbalized understanding.

## 2023-07-22 NOTE — Telephone Encounter (Signed)
Oral Chemotherapy Pharmacist Encounter  I met with patient, patient's son, and interpreter for overview of: Inlyta (axitinib) for the treatment of previously treated, metastatic renal cell carcinoma, planned duration until disease progression or unacceptable toxicity.   Counseled patient on administration, dosing, side effects, monitoring, drug-food interactions, safe handling, storage, and disposal.  CBC w/ Diff and CMP from 07/22/23 assessed, no relevant lab abnormalities requiring baseline dose adjustment required at this time. Patient will be slightly dose reduced to 3 mg BID dosing due to age and concern for tolerance. Dose may be dose escalated in the future per MD discretion. Prescription dose and frequency assessed for appropriateness.  Patient will take Inlyta 1mg  tablets, 3 tablets (3 mg total) by mouth twice daily, without regard to food, with a glass of water.  Patient instructed to avoid grapefruit and grapefruit juice while on therapy with Inlyta.  Inlyta start date: ~07/24/23 (last dose of cabometyx taken 11/12 AM)  Adverse effects include but are not limited to: hypertension, hand-foot syndrome, nausea, vomiting, diarrhea, fatigue, hoarseness, and abnormal laboratory values.   Diarrhea: Patient will obtain Imodium (loperamide) to have on hand if they experience diarrhea. Patient knows to alert the office of 4 or more loose stools above baseline.  Inlyta will be held at least 24 hours prior to surgery and resumed at discretion of treating physician based on wound healing  Reviewed with patient importance of keeping a medication schedule and plan for any missed doses. No barriers to medication adherence identified.  Medication reconciliation performed and medication/allergy list updated. Current medication list in Epic reviewed, no relevant/significant DDIs with Inlyta identified.  All questions answered.  Patient agreement for treatment documented in MD note on  07/22/23.  Ms. Brist voiced understanding and appreciation.   Medication education handout given to patient. Patient knows to call the office with questions or concerns. Oral Chemotherapy Clinic phone number provided to patient.   Lenord Carbo, PharmD, BCPS, [facility] Hematology/Oncology Clinical Pharmacist [facility] Oral Chemotherapy Navigation Clinics [phone] 07/22/2023 11:26 AM

## 2023-07-22 NOTE — Progress Notes (Signed)
Scheduled to see patient during infusion for nutrition follow-up. RD spoke with infusion nurse Harvin Hazel). Patient did not receive treatment today. Appointment rescheduled for 12/10 during infusion.

## 2023-07-23 ENCOUNTER — Other Ambulatory Visit: Payer: Self-pay | Admitting: Internal Medicine

## 2023-07-23 ENCOUNTER — Telehealth: Payer: Self-pay

## 2023-07-23 ENCOUNTER — Other Ambulatory Visit (HOSPITAL_COMMUNITY): Payer: Self-pay

## 2023-07-23 LAB — T4: T4, Total: 7 ug/dL (ref 4.5–12.0)

## 2023-07-23 MED ORDER — AXITINIB 1 MG PO TABS
3.0000 mg | ORAL_TABLET | Freq: Two times a day (BID) | ORAL | 2 refills | Status: DC
  Filled 2023-07-24: qty 90, 15d supply, fill #0
  Filled 2023-07-31: qty 180, 30d supply, fill #1
  Filled 2023-08-27: qty 180, 30d supply, fill #2

## 2023-07-23 NOTE — Telephone Encounter (Signed)
Notified [facility] of prior authorization approval for Patient's Hydrocodone 5/325 mg tablets. Medication is approved through 01/19/2024. No other needs or concerns voiced at this time.

## 2023-07-24 ENCOUNTER — Other Ambulatory Visit: Payer: Self-pay

## 2023-07-24 ENCOUNTER — Other Ambulatory Visit (HOSPITAL_COMMUNITY): Payer: Self-pay

## 2023-07-24 ENCOUNTER — Other Ambulatory Visit: Payer: Self-pay | Admitting: Pharmacy Technician

## 2023-07-24 NOTE — Progress Notes (Signed)
Oral Chemotherapy Pharmacist Encounter  Patient was counseled under telephone encounter from 07/22/23.  Lenord Carbo, PharmD, BCPS, [facility] Hematology/Oncology Clinical Pharmacist [facility] and [facility] Oral Chemotherapy Navigation Clinics [phone] 07/24/2023 9:41 AM

## 2023-07-24 NOTE — Progress Notes (Signed)
Specialty Pharmacy Initial Fill Coordination Note  Gail Fitzgerald is a 88 y.o. female contacted today regarding refills of specialty medication(s) Axitinib .  Patient requested Delivery  on 07/25/23  to verified address [address]  [place] [address]   Medication will be filled on 07/24/23.   Patient is aware of $4 copayment.

## 2023-07-29 ENCOUNTER — Other Ambulatory Visit (HOSPITAL_COMMUNITY): Payer: Self-pay

## 2023-07-29 ENCOUNTER — Ambulatory Visit: Payer: Medicaid Other | Admitting: Cardiovascular Disease

## 2023-07-31 ENCOUNTER — Other Ambulatory Visit: Payer: Self-pay

## 2023-07-31 ENCOUNTER — Other Ambulatory Visit (HOSPITAL_COMMUNITY): Payer: Self-pay

## 2023-07-31 NOTE — Progress Notes (Addendum)
Specialty Pharmacy Ongoing Clinical Assessment Note  Spoke with son and daughter-in-law for Gail Fitzgerald, a 88 y.o. female who is being followed by the specialty pharmacy service for [facility] Oncology   Patient's specialty medication(s) reviewed today: Axitinib   Missed doses in the last 4 weeks: 0   Patient/Caregiver did not have any additional questions or concerns.   Therapeutic benefit summary: Patient is achieving benefit   Adverse events/side effects summary: Experienced adverse events/side effects (ongoing constipation and shortness of breath, has been unchanged with the addition of Inlyta)   Patient's therapy is appropriate to: Continue    Goals Addressed             This Visit's Progress    Slow Disease Progression   No change    Patient is initiating therapy. Patient will maintain adherence         Follow up:  3 months  Servando Snare Specialty Pharmacist

## 2023-07-31 NOTE — Progress Notes (Signed)
Specialty Pharmacy Refill Coordination Note  Gail Fitzgerald is a 88 y.o. female contacted today regarding refills of specialty medication(s) Axitinib Spoke with daughter, Erskine Emery.  Patient requested Delivery   Delivery date: 08/05/23   Verified address: [address]  [place] [address]   Medication will be filled on 08/04/23.    '

## 2023-08-02 ENCOUNTER — Other Ambulatory Visit: Payer: Self-pay | Admitting: Nurse Practitioner

## 2023-08-04 ENCOUNTER — Other Ambulatory Visit: Payer: Self-pay

## 2023-08-04 ENCOUNTER — Other Ambulatory Visit (HOSPITAL_COMMUNITY): Payer: Self-pay

## 2023-08-13 ENCOUNTER — Other Ambulatory Visit: Payer: Self-pay

## 2023-08-13 DIAGNOSIS — G893 Neoplasm related pain (acute) (chronic): Secondary | ICD-10-CM

## 2023-08-14 ENCOUNTER — Inpatient Hospital Stay: Payer: Medicaid Other | Attending: Internal Medicine

## 2023-08-14 ENCOUNTER — Inpatient Hospital Stay: Payer: Medicaid Other | Admitting: Internal Medicine

## 2023-08-18 ENCOUNTER — Telehealth: Payer: Self-pay

## 2023-08-18 ENCOUNTER — Telehealth: Payer: Self-pay | Admitting: Medical Oncology

## 2023-08-18 NOTE — Telephone Encounter (Signed)
Called to r/s lab and f/u with [facility]. She said she left a message in the early morn on 12/10 to cancel her appt.  Schedule message sent.

## 2023-08-18 NOTE — Telephone Encounter (Signed)
Nutrition  Daughter called to cancel nutrition appointment scheduled for 12/10.  Daughter states that patient does not need this appointment.  Appointment cancelled  RD available if needed  Montoya Brandel B. Freida Busman, RD, LDN Registered Dietitian [phone]

## 2023-08-19 ENCOUNTER — Other Ambulatory Visit: Payer: Medicaid Other

## 2023-08-19 ENCOUNTER — Ambulatory Visit: Payer: Medicaid Other | Admitting: Physician Assistant

## 2023-08-19 ENCOUNTER — Ambulatory Visit: Payer: Medicaid Other

## 2023-08-19 ENCOUNTER — Inpatient Hospital Stay: Payer: Medicaid Other | Admitting: Dietician

## 2023-08-25 ENCOUNTER — Other Ambulatory Visit: Payer: Self-pay | Admitting: Internal Medicine

## 2023-08-25 ENCOUNTER — Telehealth: Payer: Self-pay | Admitting: Medical Oncology

## 2023-08-25 DIAGNOSIS — G893 Neoplasm related pain (acute) (chronic): Secondary | ICD-10-CM

## 2023-08-25 MED ORDER — HYDROCODONE-ACETAMINOPHEN 5-325 MG PO TABS: 1.0000 | ORAL_TABLET | Freq: Four times a day (QID) | ORAL | 0 refills | Status: DC | PRN

## 2023-08-25 NOTE — Telephone Encounter (Signed)
Request refill hydrocodone. 

## 2023-08-26 ENCOUNTER — Telehealth: Payer: Self-pay | Admitting: Medical Oncology

## 2023-08-26 NOTE — Telephone Encounter (Signed)
Requested refill Oxycodone for severe pain. The family is alternating the hydrocodone for mild pain and the oxycodone for severe pain. Last dispense was 06/2023.  Gail Fitzgerald refilled the hydrocodone 12/16.

## 2023-08-27 ENCOUNTER — Other Ambulatory Visit: Payer: Self-pay

## 2023-08-27 ENCOUNTER — Other Ambulatory Visit: Payer: Self-pay | Admitting: Internal Medicine

## 2023-08-27 MED ORDER — OXYCODONE HCL 5 MG PO TABS: 5.0000 mg | ORAL_TABLET | Freq: Four times a day (QID) | ORAL | 0 refills | Status: DC | PRN

## 2023-08-27 NOTE — Progress Notes (Signed)
Specialty Pharmacy Refill Coordination Note  Gail Fitzgerald is a 88 y.o. female contacted today regarding refills of specialty medication(s) Axitinib Neldon Labella)   Patient requested Delivery   Delivery date: 09/04/23   Verified address: [address]  [place] [address]   Medication will be filled on 09/02/23.

## 2023-08-28 ENCOUNTER — Telehealth: Payer: Self-pay

## 2023-08-28 NOTE — Telephone Encounter (Signed)
Notified [facility] of prior authorization approval for Patient's Oxycodone HCL 5 mg Tablets. Medication is approved through 02/24/2024. Notified Pharmacy of approval. Pharmacist stated that Patient would need to finish Hydrocodone before picking up Oxycodone. Explained to Pharmacist that Patient was to take the Oxycodone for pain rating of 7 to 10 and Hydrocodone for pain rating of 4 to 6. Provided diagnosis codes as requested. No other needs or concerns noted at this time.

## 2023-08-29 ENCOUNTER — Other Ambulatory Visit (HOSPITAL_COMMUNITY): Payer: Self-pay

## 2023-09-01 ENCOUNTER — Other Ambulatory Visit: Payer: Self-pay

## 2023-09-02 ENCOUNTER — Other Ambulatory Visit: Payer: Self-pay

## 2023-09-10 ENCOUNTER — Emergency Department (HOSPITAL_COMMUNITY): Payer: Medicaid Other

## 2023-09-10 ENCOUNTER — Observation Stay (HOSPITAL_COMMUNITY)
Admission: EM | Admit: 2023-09-10 | Discharge: 2023-09-12 | Disposition: A | Discharge status: Hospice | Payer: Medicaid Other | Attending: Family Medicine | Admitting: Family Medicine

## 2023-09-10 ENCOUNTER — Other Ambulatory Visit: Payer: Self-pay

## 2023-09-10 DIAGNOSIS — C786 Secondary malignant neoplasm of retroperitoneum and peritoneum: Secondary | ICD-10-CM | POA: Diagnosis present

## 2023-09-10 DIAGNOSIS — C649 Malignant neoplasm of unspecified kidney, except renal pelvis: Secondary | ICD-10-CM

## 2023-09-10 DIAGNOSIS — E872 Acidosis, unspecified: Secondary | ICD-10-CM | POA: Diagnosis present

## 2023-09-10 DIAGNOSIS — E871 Hypo-osmolality and hyponatremia: Secondary | ICD-10-CM | POA: Diagnosis not present

## 2023-09-10 DIAGNOSIS — C642 Malignant neoplasm of left kidney, except renal pelvis: Secondary | ICD-10-CM | POA: Diagnosis not present

## 2023-09-10 DIAGNOSIS — J910 Malignant pleural effusion: Secondary | ICD-10-CM | POA: Diagnosis not present

## 2023-09-10 DIAGNOSIS — D638 Anemia in other chronic diseases classified elsewhere: Secondary | ICD-10-CM | POA: Diagnosis not present

## 2023-09-10 DIAGNOSIS — J90 Pleural effusion, not elsewhere classified: Secondary | ICD-10-CM | POA: Diagnosis present

## 2023-09-10 DIAGNOSIS — N179 Acute kidney failure, unspecified: Secondary | ICD-10-CM | POA: Diagnosis present

## 2023-09-10 DIAGNOSIS — I5032 Chronic diastolic (congestive) heart failure: Secondary | ICD-10-CM | POA: Diagnosis present

## 2023-09-10 DIAGNOSIS — I10 Essential (primary) hypertension: Secondary | ICD-10-CM | POA: Diagnosis present

## 2023-09-10 DIAGNOSIS — R296 Repeated falls: Secondary | ICD-10-CM | POA: Diagnosis present

## 2023-09-10 DIAGNOSIS — E861 Hypovolemia: Secondary | ICD-10-CM | POA: Diagnosis present

## 2023-09-10 DIAGNOSIS — Z86718 Personal history of other venous thrombosis and embolism: Secondary | ICD-10-CM

## 2023-09-10 DIAGNOSIS — Z515 Encounter for palliative care: Secondary | ICD-10-CM | POA: Diagnosis not present

## 2023-09-10 DIAGNOSIS — C781 Secondary malignant neoplasm of mediastinum: Secondary | ICD-10-CM | POA: Diagnosis present

## 2023-09-10 DIAGNOSIS — Z7189 Other specified counseling: Secondary | ICD-10-CM

## 2023-09-10 DIAGNOSIS — Z7901 Long term (current) use of anticoagulants: Secondary | ICD-10-CM | POA: Diagnosis not present

## 2023-09-10 DIAGNOSIS — G9341 Metabolic encephalopathy: Secondary | ICD-10-CM | POA: Diagnosis present

## 2023-09-10 DIAGNOSIS — Z66 Do not resuscitate: Secondary | ICD-10-CM | POA: Diagnosis not present

## 2023-09-10 DIAGNOSIS — Z9181 History of falling: Secondary | ICD-10-CM

## 2023-09-10 DIAGNOSIS — J9601 Acute respiratory failure with hypoxia: Secondary | ICD-10-CM | POA: Diagnosis present

## 2023-09-10 DIAGNOSIS — I110 Hypertensive heart disease with heart failure: Secondary | ICD-10-CM | POA: Diagnosis present

## 2023-09-10 DIAGNOSIS — W19XXXA Unspecified fall, initial encounter: Secondary | ICD-10-CM | POA: Diagnosis present

## 2023-09-10 DIAGNOSIS — R627 Adult failure to thrive: Secondary | ICD-10-CM | POA: Diagnosis not present

## 2023-09-10 DIAGNOSIS — Z7984 Long term (current) use of oral hypoglycemic drugs: Secondary | ICD-10-CM

## 2023-09-10 DIAGNOSIS — E43 Unspecified severe protein-calorie malnutrition: Secondary | ICD-10-CM | POA: Diagnosis present

## 2023-09-10 DIAGNOSIS — M4854XA Collapsed vertebra, not elsewhere classified, thoracic region, initial encounter for fracture: Secondary | ICD-10-CM | POA: Diagnosis present

## 2023-09-10 DIAGNOSIS — R64 Cachexia: Secondary | ICD-10-CM | POA: Diagnosis present

## 2023-09-10 DIAGNOSIS — D630 Anemia in neoplastic disease: Secondary | ICD-10-CM | POA: Diagnosis present

## 2023-09-10 DIAGNOSIS — R54 Age-related physical debility: Secondary | ICD-10-CM | POA: Diagnosis present

## 2023-09-10 DIAGNOSIS — S3210XA Unspecified fracture of sacrum, initial encounter for closed fracture: Secondary | ICD-10-CM | POA: Diagnosis present

## 2023-09-10 DIAGNOSIS — I82401 Acute embolism and thrombosis of unspecified deep veins of right lower extremity: Secondary | ICD-10-CM | POA: Diagnosis not present

## 2023-09-10 DIAGNOSIS — I959 Hypotension, unspecified: Secondary | ICD-10-CM | POA: Diagnosis present

## 2023-09-10 DIAGNOSIS — G893 Neoplasm related pain (acute) (chronic): Secondary | ICD-10-CM | POA: Diagnosis present

## 2023-09-10 DIAGNOSIS — Z681 Body mass index (BMI) 19 or less, adult: Secondary | ICD-10-CM

## 2023-09-10 DIAGNOSIS — E860 Dehydration: Secondary | ICD-10-CM | POA: Diagnosis present

## 2023-09-10 DIAGNOSIS — E8809 Other disorders of plasma-protein metabolism, not elsewhere classified: Secondary | ICD-10-CM | POA: Diagnosis present

## 2023-09-10 DIAGNOSIS — C7800 Secondary malignant neoplasm of unspecified lung: Secondary | ICD-10-CM | POA: Diagnosis present

## 2023-09-10 DIAGNOSIS — Z79899 Other long term (current) drug therapy: Secondary | ICD-10-CM

## 2023-09-10 LAB — URINALYSIS, W/ REFLEX TO CULTURE (INFECTION SUSPECTED)
Bilirubin Urine: NEGATIVE
Glucose, UA: NEGATIVE mg/dL
Ketones, ur: NEGATIVE mg/dL
Nitrite: NEGATIVE
Protein, ur: NEGATIVE mg/dL
Specific Gravity, Urine: 1.013 (ref 1.005–1.030)
pH: 5 (ref 5.0–8.0)

## 2023-09-10 LAB — HEPATIC FUNCTION PANEL
ALT: 12 U/L (ref 0–44)
AST: 31 U/L (ref 15–41)
Albumin: 2.4 g/dL — ABNORMAL LOW (ref 3.5–5.0)
Alkaline Phosphatase: 108 U/L (ref 38–126)
Bilirubin, Direct: 0.3 mg/dL — ABNORMAL HIGH (ref 0.0–0.2)
Indirect Bilirubin: 0.4 mg/dL (ref 0.3–0.9)
Total Bilirubin: 0.7 mg/dL (ref 0.0–1.2)
Total Protein: 5.7 g/dL — ABNORMAL LOW (ref 6.5–8.1)

## 2023-09-10 LAB — CBC
HCT: 36.2 % (ref 36.0–46.0)
Hemoglobin: 12.6 g/dL (ref 12.0–15.0)
MCH: 28.6 pg (ref 26.0–34.0)
MCHC: 34.8 g/dL (ref 30.0–36.0)
MCV: 82.3 fL (ref 80.0–100.0)
Platelets: 358 10*3/uL (ref 150–400)
RBC: 4.4 MIL/uL (ref 3.87–5.11)
RDW: 14.6 % (ref 11.5–15.5)
WBC: 21 10*3/uL — ABNORMAL HIGH (ref 4.0–10.5)
nRBC: 0 % (ref 0.0–0.2)

## 2023-09-10 LAB — BASIC METABOLIC PANEL
Anion gap: 13 (ref 5–15)
BUN: 42 mg/dL — ABNORMAL HIGH (ref 8–23)
CO2: 26 mmol/L (ref 22–32)
Calcium: 8.1 mg/dL — ABNORMAL LOW (ref 8.9–10.3)
Chloride: 70 mmol/L — ABNORMAL LOW (ref 98–111)
Creatinine, Ser: 1.08 mg/dL — ABNORMAL HIGH (ref 0.44–1.00)
GFR, Estimated: 50 mL/min — ABNORMAL LOW (ref >60)
Glucose, Bld: 99 mg/dL (ref 70–99)
Potassium: 4.2 mmol/L (ref 3.5–5.1)
Sodium: 109 mmol/L — CL (ref 135–145)

## 2023-09-10 LAB — AMMONIA: Ammonia: 13 umol/L (ref 9–35)

## 2023-09-10 LAB — SODIUM, URINE, RANDOM: Sodium, Ur: 18 mmol/L

## 2023-09-10 LAB — LIPASE, BLOOD: Lipase: 41 U/L (ref 11–51)

## 2023-09-10 LAB — I-STAT CG4 LACTIC ACID, ED
Lactic Acid, Venous: 2.1 mmol/L (ref 0.5–1.9)
Lactic Acid, Venous: 3.3 mmol/L (ref 0.5–1.9)

## 2023-09-10 LAB — BRAIN NATRIURETIC PEPTIDE: B Natriuretic Peptide: 189.2 pg/mL — ABNORMAL HIGH (ref 0.0–100.0)

## 2023-09-10 LAB — OSMOLALITY: Osmolality: 240 mosm/kg — CL (ref 275–295)

## 2023-09-10 LAB — OSMOLALITY, URINE: Osmolality, Ur: 224 mosm/kg — ABNORMAL LOW (ref 300–900)

## 2023-09-10 MED ORDER — SODIUM CHLORIDE 0.9 % IV SOLN
500.0000 mg | Freq: Once | INTRAVENOUS | Status: AC
  Administered 2023-09-10: 500 mg via INTRAVENOUS
  Filled 2023-09-10: qty 5

## 2023-09-10 MED ORDER — LORAZEPAM 2 MG/ML IJ SOLN: 1.0000 mg | INTRAMUSCULAR | Status: DC | PRN

## 2023-09-10 MED ORDER — ACETAMINOPHEN 650 MG RE SUPP: 650.0000 mg | Freq: Four times a day (QID) | RECTAL | Status: DC | PRN

## 2023-09-10 MED ORDER — OLANZAPINE 5 MG PO TBDP
5.0000 mg | ORAL_TABLET | Freq: Every day | ORAL | Status: DC
Start: 2023-09-10 — End: 2023-09-10

## 2023-09-10 MED ORDER — LORAZEPAM 2 MG/ML PO CONC: 1.0000 mg | ORAL | Status: DC | PRN

## 2023-09-10 MED ORDER — LORAZEPAM 1 MG PO TABS
1.0000 mg | ORAL_TABLET | ORAL | Status: DC | PRN
  Administered 2023-09-10: 1 mg via ORAL
  Filled 2023-09-10: qty 1

## 2023-09-10 MED ORDER — MORPHINE SULFATE (CONCENTRATE) 10 MG /0.5 ML PO SOLN
5.0000 mg | ORAL | Status: DC | PRN
Start: 2023-09-10 — End: 2023-09-12
  Filled 2023-09-10: qty 0.5

## 2023-09-10 MED ORDER — MORPHINE SULFATE (PF) 2 MG/ML IV SOLN
1.0000 mg | INTRAVENOUS | Status: DC | PRN
  Administered 2023-09-10: 1 mg via INTRAVENOUS
  Filled 2023-09-10: qty 1

## 2023-09-10 MED ORDER — ONDANSETRON 4 MG PO TBDP: 4.0000 mg | ORAL_TABLET | Freq: Four times a day (QID) | ORAL | Status: DC | PRN

## 2023-09-10 MED ORDER — MORPHINE SULFATE (PF) 2 MG/ML IV SOLN
1.0000 mg | INTRAVENOUS | Status: DC | PRN
  Administered 2023-09-12 (×3): 2 mg via INTRAVENOUS
  Filled 2023-09-10 (×4): qty 1

## 2023-09-10 MED ORDER — MORPHINE SULFATE (CONCENTRATE) 10 MG /0.5 ML PO SOLN
5.0000 mg | ORAL | Status: DC | PRN
  Filled 2023-09-10: qty 0.5

## 2023-09-10 MED ORDER — SODIUM CHLORIDE 0.9 % IV SOLN: INTRAVENOUS | Status: DC

## 2023-09-10 MED ORDER — IOHEXOL 350 MG/ML SOLN
75.0000 mL | Freq: Once | INTRAVENOUS | Status: AC | PRN
  Administered 2023-09-10: 75 mL via INTRAVENOUS

## 2023-09-10 MED ORDER — SODIUM CHLORIDE 0.9 % IV BOLUS
500.0000 mL | Freq: Once | INTRAVENOUS | Status: AC
  Administered 2023-09-10: 500 mL via INTRAVENOUS

## 2023-09-10 MED ORDER — ONDANSETRON HCL 4 MG/2ML IJ SOLN: 4.0000 mg | Freq: Four times a day (QID) | INTRAMUSCULAR | Status: DC | PRN

## 2023-09-10 MED ORDER — SODIUM CHLORIDE 0.9% FLUSH
3.0000 mL | Freq: Two times a day (BID) | INTRAVENOUS | Status: DC
  Administered 2023-09-10 – 2023-09-12 (×4): 3 mL via INTRAVENOUS

## 2023-09-10 MED ORDER — SODIUM CHLORIDE 0.9 % IV BOLUS
1000.0000 mL | Freq: Once | INTRAVENOUS | Status: AC
  Administered 2023-09-10: 1000 mL via INTRAVENOUS

## 2023-09-10 MED ORDER — ACETAMINOPHEN 325 MG PO TABS: 650.0000 mg | ORAL_TABLET | Freq: Four times a day (QID) | ORAL | Status: DC | PRN

## 2023-09-10 MED ORDER — SODIUM CHLORIDE 0.9 % IV SOLN
1.0000 g | Freq: Once | INTRAVENOUS | Status: AC
  Administered 2023-09-10: 1 g via INTRAVENOUS
  Filled 2023-09-10: qty 10

## 2023-09-10 NOTE — ED Provider Notes (Signed)
  EMERGENCY DEPARTMENT AT [facility] Provider Note   CSN: [id number] Arrival date & time: 09/10/23  1002     History  Chief Complaint  Patient presents with   Shortness of Breath    Gail Fitzgerald is a 89 y.o. female.  This is a 89 year old female with metastatic renal cell carcinoma presented to the emergency department for nausea vomiting x 3 days.  Has not taken any of her home medications.  Family notes that she has had several falls over the past couple weeks.  For the past week however she has had some worsening confusion, not recognize her own son.  She is also had shortness of breath for several weeks.  Family primarily concerned with dehydration/nausea vomiting.  She is a DNR   Shortness of Breath     Home Medications Prior to Admission medications   Medication Sig Start Date End Date Taking? Authorizing Provider  acetaminophen  (TYLENOL ) 325 MG tablet Take 650 mg by mouth every 6 (six) hours as needed for headache.    [provider]  amLODipine (NORVASC) 10 MG tablet Take 5 mg by mouth daily. 09/14/21   [provider]  apixaban  (ELIQUIS ) 5 MG TABS tablet Take 1 tablet (5 mg total) by mouth 2 (two) times daily. 07/03/23 10/01/23  Fairy Frames, MD  axitinib  (INLYTA ) 1 MG tablet Take 3 tablets (3 mg total) by mouth 2 (two) times daily. 07/23/23   Sherrod Sherrod, MD  empagliflozin  (JARDIANCE ) 10 MG TABS tablet Take 1 tablet (10 mg total) by mouth daily. 07/04/23   Fairy Frames, MD  ferrous sulfate  325 (65 FE) MG EC tablet TAKE 1 TABLET BY MOUTH EVERY DAY WITH BREAKFAST Patient taking differently: Take 325 mg by mouth daily with breakfast. 02/09/22   Heilingoetter, Cassandra L, PA-C  HYDROcodone -acetaminophen  (NORCO) 5-325 MG tablet Take 1 tablet by mouth every 6 (six) hours as needed for moderate pain (pain score 4-6). 08/25/23   Sherrod Sherrod, MD  hydrocortisone  (ANUSOL -HC) 2.5 % rectal cream Place 1 Application rectally 2 (two)  times daily. 06/24/23   Sherrod Sherrod, MD  Magnesium  250 MG TABS Take 1 tablet by mouth every evening.    [provider]  metoprolol  tartrate (LOPRESSOR ) 50 MG tablet Take 50 mg by mouth 2 (two) times daily. 03/11/22   [provider]  mirtazapine  (REMERON ) 30 MG tablet Take 1 tablet (30 mg total) by mouth at bedtime. 06/24/23   Sherrod Sherrod, MD  Misc. Devices [facility]) MISC Light weight 09/18/21   [provider]  Multiple Vitamins-Minerals (PRESERVISION AREDS 2 PO) Take 1 tablet by mouth in the morning and at bedtime.    [provider]  oxyCODONE  (OXY IR/ROXICODONE ) 5 MG immediate release tablet Take 1 tablet (5 mg total) by mouth every 6 (six) hours as needed for severe pain (pain score 7-10). 08/27/23   Sherrod Sherrod, MD  pantoprazole  (PROTONIX ) 40 MG tablet TAKE 1 TABLET BY MOUTH EVERY DAY 08/02/23   Pickenpack-Cousar, Athena N, NP  torsemide  (DEMADEX ) 20 MG tablet Take 1 tablet (20 mg total) by mouth daily. 07/03/23   Fairy Frames, MD  Valerian Root 100 MG CAPS Take 1 capsule by mouth daily as needed (for anxiety).    [provider]      Allergies    Patient has no known allergies.    Review of Systems   Review of Systems  Respiratory:  Positive for shortness of breath.     Physical Exam Updated Vital Signs BP ROLLEN)  105/95 (BP Location: Right Arm)   Pulse 92   Temp 97.6 F (36.4 C) (Oral)   Resp 18   Ht 5' 1 (1.549 m)   Wt 40 kg   SpO2 100%   BMI 16.66 kg/m  Physical Exam Vitals and nursing note reviewed.  Constitutional:      General: She is not in acute distress.    Appearance: She is not toxic-appearing.     Comments: Cachectic  HENT:     Head: Normocephalic.     Mouth/Throat:     Mouth: Mucous membranes are dry.  Eyes:     Conjunctiva/sclera: Conjunctivae normal.  Cardiovascular:     Rate and Rhythm: Normal rate and regular rhythm.  Pulmonary:     Effort: Pulmonary effort is normal.     Breath  sounds: Examination of the left-upper field reveals decreased breath sounds. Examination of the left-middle field reveals decreased breath sounds. Examination of the left-lower field reveals decreased breath sounds. Decreased breath sounds present.  Abdominal:     General: Abdomen is flat. There is no distension.     Tenderness: There is no abdominal tenderness. There is no guarding or rebound.  Musculoskeletal:        General: Normal range of motion.  Skin:    General: Skin is warm and dry.     Capillary Refill: Capillary refill takes less than 2 seconds.  Neurological:     Mental Status: She is alert. She is disoriented.     Cranial Nerves: No cranial nerve deficit.     Sensory: No sensory deficit.     Motor: No weakness.     Coordination: Coordination normal.     ED Results / Procedures / Treatments   Labs (all labs ordered are listed, but only abnormal results are displayed) Labs Reviewed  BASIC METABOLIC PANEL - Abnormal; Notable for the following components:      Result Value   Sodium 109 (*)    Chloride 70 (*)    BUN 42 (*)    Creatinine, Ser 1.08 (*)    Calcium 8.1 (*)    GFR, Estimated 50 (*)    All other components within normal limits  CBC - Abnormal; Notable for the following components:   WBC 21.0 (*)    All other components within normal limits  BRAIN NATRIURETIC PEPTIDE - Abnormal; Notable for the following components:   B Natriuretic Peptide 189.2 (*)    All other components within normal limits  HEPATIC FUNCTION PANEL - Abnormal; Notable for the following components:   Total Protein 5.7 (*)    Albumin  2.4 (*)    Bilirubin, Direct 0.3 (*)    All other components within normal limits  URINALYSIS, W/ REFLEX TO CULTURE (INFECTION SUSPECTED) - Abnormal; Notable for the following components:   Hgb urine dipstick MODERATE (*)    Leukocytes,Ua SMALL (*)    Bacteria, UA RARE (*)    All other components within normal limits  OSMOLALITY - Abnormal; Notable for the  following components:   Osmolality 240 (*)    All other components within normal limits  OSMOLALITY, URINE - Abnormal; Notable for the following components:   Osmolality, Ur 224 (*)    All other components within normal limits  I-STAT CG4 LACTIC ACID, ED - Abnormal; Notable for the following components:   Lactic Acid, Venous 2.1 (*)    All other components within normal limits  I-STAT CG4 LACTIC ACID, ED - Abnormal; Notable for the following components:  Lactic Acid, Venous 3.3 (*)    All other components within normal limits  CULTURE, BLOOD (ROUTINE X 2)  CULTURE, BLOOD (ROUTINE X 2)  LIPASE, BLOOD  AMMONIA  SODIUM, URINE, RANDOM    EKG EKG Interpretation Date/Time:  Wednesday September 10 2023 10:17:34 EST Ventricular Rate:  99 PR Interval:  146 QRS Duration:  62 QT Interval:  318 QTC Calculation: 408 R Axis:   -70  Text Interpretation: Poor data quality, interpretation may be adversely affected Sinus rhythm with Premature atrial complexes Left axis deviation Low voltage QRS Possible Lateral infarct , age undetermined Inferior infarct , age undetermined Abnormal ECG When compared with ECG of 29-Jun-2023 16:36, PREVIOUS ECG IS PRESENT Confirmed by Neysa Clap [phone]) on 09/10/2023 4:57:28 PM  Radiology CT Angio Chest PE W and/or Wo Contrast Result Date: 09/10/2023 CLINICAL DATA:  Bowel obstruction suspected; Pulmonary embolism (PE) suspected, high prob, mental status change, hypoxia, nausea right renal cell carcinoma. * Tracking Code: BO * EXAM: CT ANGIOGRAPHY CHEST CT ABDOMEN AND PELVIS WITH CONTRAST TECHNIQUE: Multidetector CT imaging of the chest was performed using the standard protocol during bolus administration of intravenous contrast. Multiplanar CT image reconstructions and MIPs were obtained to evaluate the vascular anatomy. Multidetector CT imaging of the abdomen and pelvis was performed using the standard protocol during bolus administration of intravenous contrast.  RADIATION DOSE REDUCTION: This exam was performed according to the departmental dose-optimization program which includes automated exposure control, adjustment of the mA and/or kV according to patient size and/or use of iterative reconstruction technique. CONTRAST:  75mL OMNIPAQUE  IOHEXOL  350 MG/ML SOLN COMPARISON:  07/14/2023 CT chest, abdomen and pelvis. FINDINGS: CTA CHEST FINDINGS Cardiovascular: The study is moderate quality for the evaluation of pulmonary embolism, limited by motion degradation. There are no filling defects in the central, lobar, segmental or subsegmental pulmonary artery branches to suggest acute pulmonary embolism. Atherosclerotic thoracic aorta with dilated 4.2 cm ascending thoracic aorta. Normal caliber pulmonary arteries. Normal heart size. No significant pericardial fluid/thickening. Three-vessel coronary atherosclerosis. Mediastinum/Nodes: No significant thyroid  nodules. Mildly patulous thoracic esophagus with air-fluid level. No pathologically enlarged axillary, mediastinal or hilar lymph nodes. Lungs/Pleura: No pneumothorax. Moderate layering right pleural effusion, increased from prior chest CT. Marked irregular diffuse left pleural thickening and enhancement up to 24 mm thickness medially, not appreciably changed. Loculated small left pleural effusion component is unchanged. Stable partially calcified 1.2 cm peripheral apical right upper lobe pulmonary nodule (series 5/image 11). Patchy consolidation, ground-glass opacity and volume loss throughout the left lung is is not appreciably changed. Patchy ground-glass opacity and interlobular septal thickening throughout the right lung is worsened. No new significant discrete pulmonary nodules in the aerated portions of the lungs. Musculoskeletal: No aggressive appearing focal osseous lesions. Diffuse osteopenia. Mild T7 vertebral compression fracture with is new from 07/14/2023 CT. Moderate thoracic spondylosis. Review of the MIP images  confirms the above findings. CT ABDOMEN and PELVIS FINDINGS Hepatobiliary: Normal liver with no liver mass. Cholelithiasis. Distended gallbladder (4.5 cm diameter). No gallbladder wall thickening. No biliary ductal dilatation. Pancreas: Normal, with no mass or duct dilation. Spleen: Normal size. No mass. Adrenals/Urinary Tract: Normal adrenals. Minimally complex thinly septated 4.0 cm upper left renal cyst, for which no follow-up imaging is recommended. Heterogeneously enhancing solid 3.3 x 3.1 cm anterior lower left renal exophytic cortical mass (series 3/image 33), unchanged. No right renal masses. No hydronephrosis. Normal bladder. Stomach/Bowel: Normal non-distended stomach. Normal caliber small bowel with no small bowel wall thickening. Appendix not discretely visualized. Collapsed large-bowel.  Moderate sigmoid diverticulosis with no definite large bowel wall thickening. Vascular/Lymphatic: Atherosclerotic nonaneurysmal abdominal aorta. Patent portal, splenic, hepatic and renal veins. No pathologically enlarged lymph nodes in the abdomen or pelvis. Reproductive: Grossly normal uterus.  No adnexal mass. Other: No pneumoperitoneum. Diffuse omental soft tissue caking (series 3/image 60), worsened from prior CT. Small volume ascites. No focal fluid collections. Mild to moderate anasarca. Musculoskeletal: No aggressive appearing focal osseous lesions. Chronic moderate L1 and mild L4 vertebral compression fractures are unchanged. Diffuse osteopenia. Moderate lumbar spondylosis. Mildly angulated nondisplaced fracture of the lower sacrum (series 7/image 82), new from 07/14/2023 CT. Review of the MIP images confirms the above findings. IMPRESSION: 1. Mild T7 vertebral compression fracture, new from 07/14/2023 CT. Mildly angulated nondisplaced fracture of the lower sacrum, new from 07/14/2023 CT. Chronic moderate L1 and mild L4 vertebral compression fractures, unchanged. Diffuse osteopenia. 2. No pulmonary embolism. 3.  Moderate layering right pleural effusion, increased from prior chest CT. Marked irregular diffuse left pleural thickening and enhancement, not appreciably changed, compatible with extensive left pleural metastatic disease. Loculated small left pleural effusion component is unchanged. 4. Patchy consolidation, ground-glass opacity and volume loss throughout the left lung is not appreciably changed. Patchy ground-glass opacity and interlobular septal thickening throughout the right lung is worsened. Findings may represent a combination of pulmonary edema, pneumonia or lymphangitic tumor spread. 5. Heterogeneously enhancing solid 3.3 cm anterior lower left renal exophytic cortical mass, unchanged, compatible with renal cell carcinoma. 6. Diffuse omental soft tissue caking, worsened from prior CT, compatible with extensive peritoneal carcinomatosis. Small volume ascites. 7. Mild to moderate anasarca. 8. Cholelithiasis. Distended gallbladder (4.5 cm diameter). No gallbladder wall thickening. No biliary ductal dilatation. 9.  Aortic Atherosclerosis (ICD10-I70.0). Electronically Signed   By: Selinda DELENA Blue M.D.   On: 09/10/2023 15:10   CT ABDOMEN PELVIS W CONTRAST Result Date: 09/10/2023 CLINICAL DATA:  Bowel obstruction suspected; Pulmonary embolism (PE) suspected, high prob, mental status change, hypoxia, nausea right renal cell carcinoma. * Tracking Code: BO * EXAM: CT ANGIOGRAPHY CHEST CT ABDOMEN AND PELVIS WITH CONTRAST TECHNIQUE: Multidetector CT imaging of the chest was performed using the standard protocol during bolus administration of intravenous contrast. Multiplanar CT image reconstructions and MIPs were obtained to evaluate the vascular anatomy. Multidetector CT imaging of the abdomen and pelvis was performed using the standard protocol during bolus administration of intravenous contrast. RADIATION DOSE REDUCTION: This exam was performed according to the departmental dose-optimization program which includes  automated exposure control, adjustment of the mA and/or kV according to patient size and/or use of iterative reconstruction technique. CONTRAST:  75mL OMNIPAQUE  IOHEXOL  350 MG/ML SOLN COMPARISON:  07/14/2023 CT chest, abdomen and pelvis. FINDINGS: CTA CHEST FINDINGS Cardiovascular: The study is moderate quality for the evaluation of pulmonary embolism, limited by motion degradation. There are no filling defects in the central, lobar, segmental or subsegmental pulmonary artery branches to suggest acute pulmonary embolism. Atherosclerotic thoracic aorta with dilated 4.2 cm ascending thoracic aorta. Normal caliber pulmonary arteries. Normal heart size. No significant pericardial fluid/thickening. Three-vessel coronary atherosclerosis. Mediastinum/Nodes: No significant thyroid  nodules. Mildly patulous thoracic esophagus with air-fluid level. No pathologically enlarged axillary, mediastinal or hilar lymph nodes. Lungs/Pleura: No pneumothorax. Moderate layering right pleural effusion, increased from prior chest CT. Marked irregular diffuse left pleural thickening and enhancement up to 24 mm thickness medially, not appreciably changed. Loculated small left pleural effusion component is unchanged. Stable partially calcified 1.2 cm peripheral apical right upper lobe pulmonary nodule (series 5/image 11). Patchy consolidation, ground-glass opacity and  volume loss throughout the left lung is is not appreciably changed. Patchy ground-glass opacity and interlobular septal thickening throughout the right lung is worsened. No new significant discrete pulmonary nodules in the aerated portions of the lungs. Musculoskeletal: No aggressive appearing focal osseous lesions. Diffuse osteopenia. Mild T7 vertebral compression fracture with is new from 07/14/2023 CT. Moderate thoracic spondylosis. Review of the MIP images confirms the above findings. CT ABDOMEN and PELVIS FINDINGS Hepatobiliary: Normal liver with no liver mass.  Cholelithiasis. Distended gallbladder (4.5 cm diameter). No gallbladder wall thickening. No biliary ductal dilatation. Pancreas: Normal, with no mass or duct dilation. Spleen: Normal size. No mass. Adrenals/Urinary Tract: Normal adrenals. Minimally complex thinly septated 4.0 cm upper left renal cyst, for which no follow-up imaging is recommended. Heterogeneously enhancing solid 3.3 x 3.1 cm anterior lower left renal exophytic cortical mass (series 3/image 33), unchanged. No right renal masses. No hydronephrosis. Normal bladder. Stomach/Bowel: Normal non-distended stomach. Normal caliber small bowel with no small bowel wall thickening. Appendix not discretely visualized. Collapsed large-bowel. Moderate sigmoid diverticulosis with no definite large bowel wall thickening. Vascular/Lymphatic: Atherosclerotic nonaneurysmal abdominal aorta. Patent portal, splenic, hepatic and renal veins. No pathologically enlarged lymph nodes in the abdomen or pelvis. Reproductive: Grossly normal uterus.  No adnexal mass. Other: No pneumoperitoneum. Diffuse omental soft tissue caking (series 3/image 60), worsened from prior CT. Small volume ascites. No focal fluid collections. Mild to moderate anasarca. Musculoskeletal: No aggressive appearing focal osseous lesions. Chronic moderate L1 and mild L4 vertebral compression fractures are unchanged. Diffuse osteopenia. Moderate lumbar spondylosis. Mildly angulated nondisplaced fracture of the lower sacrum (series 7/image 82), new from 07/14/2023 CT. Review of the MIP images confirms the above findings. IMPRESSION: 1. Mild T7 vertebral compression fracture, new from 07/14/2023 CT. Mildly angulated nondisplaced fracture of the lower sacrum, new from 07/14/2023 CT. Chronic moderate L1 and mild L4 vertebral compression fractures, unchanged. Diffuse osteopenia. 2. No pulmonary embolism. 3. Moderate layering right pleural effusion, increased from prior chest CT. Marked irregular diffuse left  pleural thickening and enhancement, not appreciably changed, compatible with extensive left pleural metastatic disease. Loculated small left pleural effusion component is unchanged. 4. Patchy consolidation, ground-glass opacity and volume loss throughout the left lung is not appreciably changed. Patchy ground-glass opacity and interlobular septal thickening throughout the right lung is worsened. Findings may represent a combination of pulmonary edema, pneumonia or lymphangitic tumor spread. 5. Heterogeneously enhancing solid 3.3 cm anterior lower left renal exophytic cortical mass, unchanged, compatible with renal cell carcinoma. 6. Diffuse omental soft tissue caking, worsened from prior CT, compatible with extensive peritoneal carcinomatosis. Small volume ascites. 7. Mild to moderate anasarca. 8. Cholelithiasis. Distended gallbladder (4.5 cm diameter). No gallbladder wall thickening. No biliary ductal dilatation. 9.  Aortic Atherosclerosis (ICD10-I70.0). Electronically Signed   By: Selinda DELENA Blue M.D.   On: 09/10/2023 15:10   CT Head Wo Contrast Result Date: 09/10/2023 CLINICAL DATA:  Head trauma, minor (Age >= 65y). Altered mental status. EXAM: CT HEAD WITHOUT CONTRAST TECHNIQUE: Contiguous axial images were obtained from the base of the skull through the vertex without intravenous contrast. RADIATION DOSE REDUCTION: This exam was performed according to the departmental dose-optimization program which includes automated exposure control, adjustment of the mA and/or kV according to patient size and/or use of iterative reconstruction technique. COMPARISON:  Head CT 10/01/2021.  MRI brain 07/19/2023. FINDINGS: Brain: No acute hemorrhage. Stable background of mild chronic small-vessel disease. New small focus of hypoattenuation in the medial aspect of the left thalamus (axial image 16 series  4), suspicious for infarct. Cortical gray-white differentiation is otherwise preserved. Prominence of the ventricles and sulci  within expected range for age. No hydrocephalus or extra-axial collection. No mass effect or midline shift. Vascular: No hyperdense vessel or unexpected calcification. Skull: No calvarial fracture or suspicious bone lesion. Skull base is unremarkable. Sinuses/Orbits: No acute finding. Other: None. IMPRESSION: 1. New small focus of hypoattenuation in the medial aspect of the left thalamus, suspicious for infarct. Consider brain MRI for further evaluation. 2. No acute hemorrhage. Electronically Signed   By: Ryan Chess M.D.   On: 09/10/2023 14:53   DG Chest 2 View Result Date: 09/10/2023 CLINICAL DATA:  Shortness of breath. EXAM: CHEST - 2 VIEW COMPARISON:  06/30/2023 FINDINGS: Lungs are hyperexpanded. Interstitial markings are diffusely coarsened with chronic features. Diffuse airspace disease in the left lung again noted, similar to prior. Left pleural thickening again noted. There is new patchy airspace disease at the right base. Small bilateral pleural effusions noted. The cardio pericardial silhouette is enlarged. Bones are diffusely demineralized. IMPRESSION: 1. New patchy airspace disease at the right base. Imaging features could be related to atelectasis or developing infiltrate. 2. Persistent diffuse airspace disease in the left lung with diffuse left-sided pleural thickening. 3. Small bilateral pleural effusions. Electronically Signed   By: Camellia Candle M.D.   On: 09/10/2023 11:37    Procedures .Critical Care  Performed by: Neysa Caron PARAS, DO Authorized by: Neysa Caron PARAS, DO   Critical care provider statement:    Critical care time (minutes):  30   Critical care was necessary to treat or prevent imminent or life-threatening deterioration of the following conditions:  CNS failure or compromise, dehydration, endocrine crisis, respiratory failure and sepsis   Critical care was time spent personally by me on the following activities:  Blood draw for specimens, discussions with consultants,  evaluation of patient's response to treatment, examination of patient, obtaining history from patient or surrogate, ordering and performing treatments and interventions, ordering and review of laboratory studies, ordering and review of radiographic studies, pulse oximetry, re-evaluation of patient's condition and review of old charts   I assumed direction of critical care for this patient from another provider in my specialty: no     Care discussed with: admitting provider       Medications Ordered in ED Medications  sodium chloride  0.9 % bolus 1,000 mL (has no administration in time range)  cefTRIAXone  (ROCEPHIN ) 1 g in sodium chloride  0.9 % 100 mL IVPB (0 g Intravenous Stopped 09/10/23 1406)  azithromycin  (ZITHROMAX ) 500 mg in sodium chloride  0.9 % 250 mL IVPB (0 mg Intravenous Stopped 09/10/23 1528)  sodium chloride  0.9 % bolus 500 mL (0 mLs Intravenous Stopped 09/10/23 1528)  iohexol  (OMNIPAQUE ) 350 MG/ML injection 75 mL (75 mLs Intravenous Contrast Given 09/10/23 1400)    ED Course/ Medical Decision Making/ A&P Clinical Course as of 09/10/23 1720  Wed Sep 10, 2023  1223 Basic metabolic panel(!!) Appears to have some component of chronic hyponatremia 120s to 130s.  Is significantly low today.  Patient does clinically look dry and has had reportedly no p.o. intake for the past 3 days. [TY]  1311 Echo 2024 per chart review: FINDINGS   Left Ventricle: Left ventricular ejection fraction, by estimation, is  55%. The left ventricle has normal function. The left ventricle has no  regional wall motion abnormalities. The left ventricular internal cavity  size was normal in size. There is no  left ventricular hypertrophy. Left ventricular diastolic parameters are  consistent with Grade I diastolic dysfunction (impaired relaxation).   [TY]  1313 Admitted 06/29/23 : PMH per d/c summary 88/F w/ metastatic renal cell carcinoma, pulmonary mets, diastolic CHF, chronic hyponatremia, chronic anemia,  hypertension  Had thoracentesis during admission and noted to be hyponatremic down to 120. Thought to be multifactorial [id number] spacing 2/2 poor oral intake.  [TY]  1647 ICU evaluated patient.  Recommending admission to general medicine team.  Will write note, but recommending IV fluid bolus and gentle hydration.  Pain medications for palliatiation, possible palliative thoracentesis tomorrow. [TY]  1704 Given patient CT head follow-up MRI ordered.  CTA without pneumonia.  Appears to have a possible new thoracic vertebral fractures.   [TY]    Clinical Course User Index [TY] Neysa Caron PARAS, DO                                 Medical Decision Making Is an 89 year old female presenting emergency department for nausea vomiting and concerns of dehydration.  Slightly hypothermic, tachycardic and tachypneic on arrival.  Questionable hypoxia as reported to be 83% in triage, but was able to regain oxygen saturation with slowing her breaths by family member.  Placed on oxygen for comfort.  Unclear if ultimately status is secondary to fall, dehydration, delirium or infection.  CT head ordered to evaluate.  Given her signs of infection will get infectious workup.  CT abdomen to evaluate for obstructive pathology causing her nausea vomiting.  Gentle fluid hydration as she has documented CHF.  Amount and/or Complexity of Data Reviewed Independent Historian:     Details: Family provided history as patient is primarily Russian-speaking.  External Data Reviewed:     Details: Per chart review is on Eliquis  has had previous thoracentesis on the left Labs: ordered. Decision-making details documented in ED Course.    Details: See ED course Radiology: ordered.    Details: See ED course ECG/medicine tests:     Details: No ST segment changes to indicate ischemia on my independent interpretation. Discussion of management or test interpretation with external provider(s): Case discussed with hospitalist.   See ED course.  Risk Prescription drug management. Decision regarding hospitalization. Decision not to resuscitate or to de-escalate care because of poor prognosis. Diagnosis or treatment significantly limited by social determinants of health. Risk Details: Patient is DNR.  Russian-speaking primarily          Final Clinical Impression(s) / ED Diagnoses Final diagnoses:  Hyponatremia  Failure to thrive in adult    Rx / DC Orders ED Discharge Orders     None         Neysa Caron PARAS, DO 09/10/23 1720

## 2023-09-10 NOTE — H&P (Signed)
 History and Physical   Gail Fitzgerald FMW:[id number] DOB: [birth year] DOA: 09/10/2023  PCP: [facility]   Patient coming from: Home  Chief Complaint: Multiple complaints including shortness of breath, nausea, vomiting, confusion  HPI: Gail Fitzgerald is a 89 y.o. female with medical history significant of hypertension, anemia, DVT, CHF, metastatic renal cancer presenting with shortness of breath, vomiting, confusion, weakness.  History obtained assistance of chart review and family given patient's confusion and language barrier..  Patient has known metastatic renal cancer with history of malignant pleural effusion and on palliative care but has declined hospice care in the past.  Has had several weeks of shortness of breath and ongoing decreased p.o. intake.  Has had 1 week of increasing confusion weakness.  Has had several falls over the last couple weeks as well.  And for the last several days has had worsening nausea and vomiting unable to keep down her medications.  Critical care I spoke with family patient and per their note patient was DO NOT RESUSCITATE and comfort care status.  I have confirmed this with patient's family by phone.  Patient complains of pain (primarily at her bottom due to pressure) and dyspnea.  ED Course: Vital signs in the ED notable for blood pressure 100s to 130s systolic.  Lab workup included CMP with sodium 109, chloride 70, BUN 42, creatinine elevated to 1.08 from baseline 0.6, calcium 8.1, protein 5.9, albumin  2.4.  CBC with leukocytosis to 21.  BNP borderline at 189.  Lactic acid 2.1 and then increased to 3.3 on repeat.  Lipase negative.  Blood cultures pending.  Urinalysis with hemoglobin, leukocytes, rare bacteria.  Ammonia normal.  Osmolality 248, urine osmolality 224, urine sodium pending.  Chest x-ray with new patchy airspace disease in right base, stable diffuse disease otherwise.  CT head shows suspicious changes for infarct.  CTA PE  study showed no PE but did showed new T7 compression fracture and sacral fracture.  Continued moderate right pleural effusion and patchy opacities of unclear etiology worse on the right.  Stable opacities otherwise.  CT abdomen pelvis showed known renal mass, omental caking, mild to moderate anasarca.  Patient received ceftriaxone  and azithromycin  in the ED.  Also received 1.5 L IV fluids.  Critical care consulted given severe hyponatremia and neurologic symptoms. Discussion with family had and patient was confirmed to be DNR and family decided to pursue comfort measures.  Review of Systems: As per HPI otherwise all other systems reviewed and are negative.  Past Medical History:  Diagnosis Date   Hypertension    Renal cell carcinoma (HCC)    With mets to the lungs    Past Surgical History:  Procedure Laterality Date   IR THORACENTESIS ASP PLEURAL SPACE W/IMG GUIDE  04/05/2021   IR THORACENTESIS ASP PLEURAL SPACE W/IMG GUIDE  06/30/2023   THORACENTESIS N/A 04/12/2021   Procedure: Gail Fitzgerald;  Surgeon: Brenna Adine CROME, DO;  Location: [facility];  Service: Pulmonary;  Laterality: N/A;    Social History  reports that she has never smoked. She has never used smokeless tobacco. She reports that she does not currently use alcohol. She reports that she does not currently use drugs.  No Known Allergies  Family History  Problem Relation Age of Onset   Diabetes Neg Hx    Kidney disease Neg Hx    Prior to Admission medications   Medication Sig Start Date End Date Taking? Authorizing Provider  acetaminophen  (TYLENOL ) 325 MG tablet Take 650 mg by mouth  every 6 (six) hours as needed for headache.    [provider]  amLODipine (NORVASC) 10 MG tablet Take 5 mg by mouth daily. 09/14/21   [provider]  apixaban  (ELIQUIS ) 5 MG TABS tablet Take 1 tablet (5 mg total) by mouth 2 (two) times daily. 07/03/23 10/01/23  Fairy Frames, MD  axitinib  (INLYTA ) 1 MG tablet Take 3 tablets  (3 mg total) by mouth 2 (two) times daily. 07/23/23   Sherrod Sherrod, MD  empagliflozin  (JARDIANCE ) 10 MG TABS tablet Take 1 tablet (10 mg total) by mouth daily. 07/04/23   Fairy Frames, MD  ferrous sulfate  325 (65 FE) MG EC tablet TAKE 1 TABLET BY MOUTH EVERY DAY WITH BREAKFAST Patient taking differently: Take 325 mg by mouth daily with breakfast. 02/09/22   Heilingoetter, Cassandra L, PA-C  HYDROcodone -acetaminophen  (NORCO) 5-325 MG tablet Take 1 tablet by mouth every 6 (six) hours as needed for moderate pain (pain score 4-6). 08/25/23   Sherrod Sherrod, MD  hydrocortisone  (ANUSOL -HC) 2.5 % rectal cream Place 1 Application rectally 2 (two) times daily. 06/24/23   Sherrod Sherrod, MD  Magnesium  250 MG TABS Take 1 tablet by mouth every evening.    [provider]  metoprolol  tartrate (LOPRESSOR ) 50 MG tablet Take 50 mg by mouth 2 (two) times daily. 03/11/22   [provider]  mirtazapine  (REMERON ) 30 MG tablet Take 1 tablet (30 mg total) by mouth at bedtime. 06/24/23   Sherrod Sherrod, MD  Misc. Devices [facility]) MISC Light weight 09/18/21   [provider]  Multiple Vitamins-Minerals (PRESERVISION AREDS 2 PO) Take 1 tablet by mouth in the morning and at bedtime.    [provider]  oxyCODONE  (OXY IR/ROXICODONE ) 5 MG immediate release tablet Take 1 tablet (5 mg total) by mouth every 6 (six) hours as needed for severe pain (pain score 7-10). 08/27/23   Sherrod Sherrod, MD  pantoprazole  (PROTONIX ) 40 MG tablet TAKE 1 TABLET BY MOUTH EVERY DAY 08/02/23   Pickenpack-Cousar, Athena N, NP  torsemide  (DEMADEX ) 20 MG tablet Take 1 tablet (20 mg total) by mouth daily. 07/03/23   Fairy Frames, MD  Valerian Root 100 MG CAPS Take 1 capsule by mouth daily as needed (for anxiety).    [provider]    Physical Exam: Vitals:   09/10/23 1330 09/10/23 1530 09/10/23 1533 09/10/23 1536  BP: 138/78 (!) 105/95    Pulse: 90 92    Resp: 11 18    Temp:   97.6 F  (36.4 C)   TempSrc:   Oral   SpO2: 100% 100%    Weight:    40 kg  Height:    5' 1 (1.549 m)    Physical Exam Constitutional:      General: She is in acute distress.     Appearance: Normal appearance. She is ill-appearing.     Comments: Thin, uncomfortable, ill-appearing elderly female.  HENT:     Head: Normocephalic and atraumatic.     Mouth/Throat:     Mouth: Mucous membranes are moist.     Pharynx: Oropharynx is clear.  Eyes:     Extraocular Movements: Extraocular movements intact.     Pupils: Pupils are equal, round, and reactive to light.  Cardiovascular:     Rate and Rhythm: Normal rate and regular rhythm.     Pulses: Normal pulses.     Heart sounds: Normal heart sounds.  Pulmonary:     Effort: Pulmonary effort is normal. No respiratory distress.  Breath sounds: Normal breath sounds.  Abdominal:     General: Bowel sounds are normal. There is no distension.     Palpations: Abdomen is soft.     Tenderness: There is no abdominal tenderness.  Musculoskeletal:        General: No swelling or deformity.  Skin:    General: Skin is warm and dry.  Neurological:     General: No focal deficit present.     Mental Status: Mental status is at baseline.    Labs on Admission: I have personally reviewed following labs and imaging studies  CBC: Recent Labs  Lab 09/10/23 1037  WBC 21.0*  HGB 12.6  HCT 36.2  MCV 82.3  PLT 358    Basic Metabolic Panel: Recent Labs  Lab 09/10/23 1037  NA 109*  K 4.2  CL 70*  CO2 26  GLUCOSE 99  BUN 42*  CREATININE 1.08*  CALCIUM 8.1*    GFR: Estimated Creatinine Clearance: 23.6 mL/min (A) (by C-G formula based on SCr of 1.08 mg/dL (H)).  Liver Function Tests: Recent Labs  Lab 09/10/23 1244  AST 31  ALT 12  ALKPHOS 108  BILITOT 0.7  PROT 5.7*  ALBUMIN  2.4*    Urine analysis:    Component Value Date/Time   COLORURINE YELLOW 09/10/2023 1450   APPEARANCEUR CLEAR 09/10/2023 1450   LABSPEC 1.013 09/10/2023 1450    PHURINE 5.0 09/10/2023 1450   GLUCOSEU NEGATIVE 09/10/2023 1450   HGBUR MODERATE (A) 09/10/2023 1450   BILIRUBINUR NEGATIVE 09/10/2023 1450   KETONESUR NEGATIVE 09/10/2023 1450   PROTEINUR NEGATIVE 09/10/2023 1450   NITRITE NEGATIVE 09/10/2023 1450   LEUKOCYTESUR SMALL (A) 09/10/2023 1450    Radiological Exams on Admission: CT Angio Chest PE W and/or Wo Contrast Result Date: 09/10/2023 CLINICAL DATA:  Bowel obstruction suspected; Pulmonary embolism (PE) suspected, high prob, mental status change, hypoxia, nausea right renal cell carcinoma. * Tracking Code: BO * EXAM: CT ANGIOGRAPHY CHEST CT ABDOMEN AND PELVIS WITH CONTRAST TECHNIQUE: Multidetector CT imaging of the chest was performed using the standard protocol during bolus administration of intravenous contrast. Multiplanar CT image reconstructions and MIPs were obtained to evaluate the vascular anatomy. Multidetector CT imaging of the abdomen and pelvis was performed using the standard protocol during bolus administration of intravenous contrast. RADIATION DOSE REDUCTION: This exam was performed according to the departmental dose-optimization program which includes automated exposure control, adjustment of the mA and/or kV according to patient size and/or use of iterative reconstruction technique. CONTRAST:  75mL OMNIPAQUE  IOHEXOL  350 MG/ML SOLN COMPARISON:  07/14/2023 CT chest, abdomen and pelvis. FINDINGS: CTA CHEST FINDINGS Cardiovascular: The study is moderate quality for the evaluation of pulmonary embolism, limited by motion degradation. There are no filling defects in the central, lobar, segmental or subsegmental pulmonary artery branches to suggest acute pulmonary embolism. Atherosclerotic thoracic aorta with dilated 4.2 cm ascending thoracic aorta. Normal caliber pulmonary arteries. Normal heart size. No significant pericardial fluid/thickening. Three-vessel coronary atherosclerosis. Mediastinum/Nodes: No significant thyroid  nodules. Mildly  patulous thoracic esophagus with air-fluid level. No pathologically enlarged axillary, mediastinal or hilar lymph nodes. Lungs/Pleura: No pneumothorax. Moderate layering right pleural effusion, increased from prior chest CT. Marked irregular diffuse left pleural thickening and enhancement up to 24 mm thickness medially, not appreciably changed. Loculated small left pleural effusion component is unchanged. Stable partially calcified 1.2 cm peripheral apical right upper lobe pulmonary nodule (series 5/image 11). Patchy consolidation, ground-glass opacity and volume loss throughout the left lung is is not appreciably changed. Patchy ground-glass  opacity and interlobular septal thickening throughout the right lung is worsened. No new significant discrete pulmonary nodules in the aerated portions of the lungs. Musculoskeletal: No aggressive appearing focal osseous lesions. Diffuse osteopenia. Mild T7 vertebral compression fracture with is new from 07/14/2023 CT. Moderate thoracic spondylosis. Review of the MIP images confirms the above findings. CT ABDOMEN and PELVIS FINDINGS Hepatobiliary: Normal liver with no liver mass. Cholelithiasis. Distended gallbladder (4.5 cm diameter). No gallbladder wall thickening. No biliary ductal dilatation. Pancreas: Normal, with no mass or duct dilation. Spleen: Normal size. No mass. Adrenals/Urinary Tract: Normal adrenals. Minimally complex thinly septated 4.0 cm upper left renal cyst, for which no follow-up imaging is recommended. Heterogeneously enhancing solid 3.3 x 3.1 cm anterior lower left renal exophytic cortical mass (series 3/image 33), unchanged. No right renal masses. No hydronephrosis. Normal bladder. Stomach/Bowel: Normal non-distended stomach. Normal caliber small bowel with no small bowel wall thickening. Appendix not discretely visualized. Collapsed large-bowel. Moderate sigmoid diverticulosis with no definite large bowel wall thickening. Vascular/Lymphatic:  Atherosclerotic nonaneurysmal abdominal aorta. Patent portal, splenic, hepatic and renal veins. No pathologically enlarged lymph nodes in the abdomen or pelvis. Reproductive: Grossly normal uterus.  No adnexal mass. Other: No pneumoperitoneum. Diffuse omental soft tissue caking (series 3/image 60), worsened from prior CT. Small volume ascites. No focal fluid collections. Mild to moderate anasarca. Musculoskeletal: No aggressive appearing focal osseous lesions. Chronic moderate L1 and mild L4 vertebral compression fractures are unchanged. Diffuse osteopenia. Moderate lumbar spondylosis. Mildly angulated nondisplaced fracture of the lower sacrum (series 7/image 82), new from 07/14/2023 CT. Review of the MIP images confirms the above findings. IMPRESSION: 1. Mild T7 vertebral compression fracture, new from 07/14/2023 CT. Mildly angulated nondisplaced fracture of the lower sacrum, new from 07/14/2023 CT. Chronic moderate L1 and mild L4 vertebral compression fractures, unchanged. Diffuse osteopenia. 2. No pulmonary embolism. 3. Moderate layering right pleural effusion, increased from prior chest CT. Marked irregular diffuse left pleural thickening and enhancement, not appreciably changed, compatible with extensive left pleural metastatic disease. Loculated small left pleural effusion component is unchanged. 4. Patchy consolidation, ground-glass opacity and volume loss throughout the left lung is not appreciably changed. Patchy ground-glass opacity and interlobular septal thickening throughout the right lung is worsened. Findings may represent a combination of pulmonary edema, pneumonia or lymphangitic tumor spread. 5. Heterogeneously enhancing solid 3.3 cm anterior lower left renal exophytic cortical mass, unchanged, compatible with renal cell carcinoma. 6. Diffuse omental soft tissue caking, worsened from prior CT, compatible with extensive peritoneal carcinomatosis. Small volume ascites. 7. Mild to moderate anasarca.  8. Cholelithiasis. Distended gallbladder (4.5 cm diameter). No gallbladder wall thickening. No biliary ductal dilatation. 9.  Aortic Atherosclerosis (ICD10-I70.0). Electronically Signed   By: Selinda DELENA Blue M.D.   On: 09/10/2023 15:10   CT ABDOMEN PELVIS W CONTRAST Result Date: 09/10/2023 CLINICAL DATA:  Bowel obstruction suspected; Pulmonary embolism (PE) suspected, high prob, mental status change, hypoxia, nausea right renal cell carcinoma. * Tracking Code: BO * EXAM: CT ANGIOGRAPHY CHEST CT ABDOMEN AND PELVIS WITH CONTRAST TECHNIQUE: Multidetector CT imaging of the chest was performed using the standard protocol during bolus administration of intravenous contrast. Multiplanar CT image reconstructions and MIPs were obtained to evaluate the vascular anatomy. Multidetector CT imaging of the abdomen and pelvis was performed using the standard protocol during bolus administration of intravenous contrast. RADIATION DOSE REDUCTION: This exam was performed according to the departmental dose-optimization program which includes automated exposure control, adjustment of the mA and/or kV according to patient size and/or use of  iterative reconstruction technique. CONTRAST:  75mL OMNIPAQUE  IOHEXOL  350 MG/ML SOLN COMPARISON:  07/14/2023 CT chest, abdomen and pelvis. FINDINGS: CTA CHEST FINDINGS Cardiovascular: The study is moderate quality for the evaluation of pulmonary embolism, limited by motion degradation. There are no filling defects in the central, lobar, segmental or subsegmental pulmonary artery branches to suggest acute pulmonary embolism. Atherosclerotic thoracic aorta with dilated 4.2 cm ascending thoracic aorta. Normal caliber pulmonary arteries. Normal heart size. No significant pericardial fluid/thickening. Three-vessel coronary atherosclerosis. Mediastinum/Nodes: No significant thyroid  nodules. Mildly patulous thoracic esophagus with air-fluid level. No pathologically enlarged axillary, mediastinal or hilar  lymph nodes. Lungs/Pleura: No pneumothorax. Moderate layering right pleural effusion, increased from prior chest CT. Marked irregular diffuse left pleural thickening and enhancement up to 24 mm thickness medially, not appreciably changed. Loculated small left pleural effusion component is unchanged. Stable partially calcified 1.2 cm peripheral apical right upper lobe pulmonary nodule (series 5/image 11). Patchy consolidation, ground-glass opacity and volume loss throughout the left lung is is not appreciably changed. Patchy ground-glass opacity and interlobular septal thickening throughout the right lung is worsened. No new significant discrete pulmonary nodules in the aerated portions of the lungs. Musculoskeletal: No aggressive appearing focal osseous lesions. Diffuse osteopenia. Mild T7 vertebral compression fracture with is new from 07/14/2023 CT. Moderate thoracic spondylosis. Review of the MIP images confirms the above findings. CT ABDOMEN and PELVIS FINDINGS Hepatobiliary: Normal liver with no liver mass. Cholelithiasis. Distended gallbladder (4.5 cm diameter). No gallbladder wall thickening. No biliary ductal dilatation. Pancreas: Normal, with no mass or duct dilation. Spleen: Normal size. No mass. Adrenals/Urinary Tract: Normal adrenals. Minimally complex thinly septated 4.0 cm upper left renal cyst, for which no follow-up imaging is recommended. Heterogeneously enhancing solid 3.3 x 3.1 cm anterior lower left renal exophytic cortical mass (series 3/image 33), unchanged. No right renal masses. No hydronephrosis. Normal bladder. Stomach/Bowel: Normal non-distended stomach. Normal caliber small bowel with no small bowel wall thickening. Appendix not discretely visualized. Collapsed large-bowel. Moderate sigmoid diverticulosis with no definite large bowel wall thickening. Vascular/Lymphatic: Atherosclerotic nonaneurysmal abdominal aorta. Patent portal, splenic, hepatic and renal veins. No pathologically  enlarged lymph nodes in the abdomen or pelvis. Reproductive: Grossly normal uterus.  No adnexal mass. Other: No pneumoperitoneum. Diffuse omental soft tissue caking (series 3/image 60), worsened from prior CT. Small volume ascites. No focal fluid collections. Mild to moderate anasarca. Musculoskeletal: No aggressive appearing focal osseous lesions. Chronic moderate L1 and mild L4 vertebral compression fractures are unchanged. Diffuse osteopenia. Moderate lumbar spondylosis. Mildly angulated nondisplaced fracture of the lower sacrum (series 7/image 82), new from 07/14/2023 CT. Review of the MIP images confirms the above findings. IMPRESSION: 1. Mild T7 vertebral compression fracture, new from 07/14/2023 CT. Mildly angulated nondisplaced fracture of the lower sacrum, new from 07/14/2023 CT. Chronic moderate L1 and mild L4 vertebral compression fractures, unchanged. Diffuse osteopenia. 2. No pulmonary embolism. 3. Moderate layering right pleural effusion, increased from prior chest CT. Marked irregular diffuse left pleural thickening and enhancement, not appreciably changed, compatible with extensive left pleural metastatic disease. Loculated small left pleural effusion component is unchanged. 4. Patchy consolidation, ground-glass opacity and volume loss throughout the left lung is not appreciably changed. Patchy ground-glass opacity and interlobular septal thickening throughout the right lung is worsened. Findings may represent a combination of pulmonary edema, pneumonia or lymphangitic tumor spread. 5. Heterogeneously enhancing solid 3.3 cm anterior lower left renal exophytic cortical mass, unchanged, compatible with renal cell carcinoma. 6. Diffuse omental soft tissue caking, worsened from prior CT, compatible  with extensive peritoneal carcinomatosis. Small volume ascites. 7. Mild to moderate anasarca. 8. Cholelithiasis. Distended gallbladder (4.5 cm diameter). No gallbladder wall thickening. No biliary ductal  dilatation. 9.  Aortic Atherosclerosis (ICD10-I70.0). Electronically Signed   By: Selinda DELENA Blue M.D.   On: 09/10/2023 15:10   CT Head Wo Contrast Result Date: 09/10/2023 CLINICAL DATA:  Head trauma, minor (Age >= 65y). Altered mental status. EXAM: CT HEAD WITHOUT CONTRAST TECHNIQUE: Contiguous axial images were obtained from the base of the skull through the vertex without intravenous contrast. RADIATION DOSE REDUCTION: This exam was performed according to the departmental dose-optimization program which includes automated exposure control, adjustment of the mA and/or kV according to patient size and/or use of iterative reconstruction technique. COMPARISON:  Head CT 10/01/2021.  MRI brain 07/19/2023. FINDINGS: Brain: No acute hemorrhage. Stable background of mild chronic small-vessel disease. New small focus of hypoattenuation in the medial aspect of the left thalamus (axial image 16 series 4), suspicious for infarct. Cortical gray-white differentiation is otherwise preserved. Prominence of the ventricles and sulci within expected range for age. No hydrocephalus or extra-axial collection. No mass effect or midline shift. Vascular: No hyperdense vessel or unexpected calcification. Skull: No calvarial fracture or suspicious bone lesion. Skull base is unremarkable. Sinuses/Orbits: No acute finding. Other: None. IMPRESSION: 1. New small focus of hypoattenuation in the medial aspect of the left thalamus, suspicious for infarct. Consider brain MRI for further evaluation. 2. No acute hemorrhage. Electronically Signed   By: Ryan Chess M.D.   On: 09/10/2023 14:53   DG Chest 2 View Result Date: 09/10/2023 CLINICAL DATA:  Shortness of breath. EXAM: CHEST - 2 VIEW COMPARISON:  06/30/2023 FINDINGS: Lungs are hyperexpanded. Interstitial markings are diffusely coarsened with chronic features. Diffuse airspace disease in the left lung again noted, similar to prior. Left pleural thickening again noted. There is new patchy  airspace disease at the right base. Small bilateral pleural effusions noted. The cardio pericardial silhouette is enlarged. Bones are diffusely demineralized. IMPRESSION: 1. New patchy airspace disease at the right base. Imaging features could be related to atelectasis or developing infiltrate. 2. Persistent diffuse airspace disease in the left lung with diffuse left-sided pleural thickening. 3. Small bilateral pleural effusions. Electronically Signed   By: Camellia Candle M.D.   On: 09/10/2023 11:37   EKG: Independently reviewed.  Sinus rhythm and 99 bpm.  PACs noted.  Low voltage multiple leads.  Significant baseline wander baseline artifact also noted.  Assessment/Plan Active Problems:   Acute deep vein thrombosis (DVT) of right lower extremity (HCC)   Essential hypertension   Anemia of chronic disease   Metastatic renal cell carcinoma Known malignant effusion Omental caking > Patient with progressive disease, now presenting with nausea vomiting, confusion, weakness, falls, decreased p.o. intake, severe hyponatremia as below. > Discussions have been held in the ED with EDP and critical care.  Patient had made DNR and after consultation with critical care family decided to make patient comfort care, I have confirmed this at bedside. -Consult placed to palliative care, appreciate critical care input and assistance. -Continue with supplemental oxygen for comfort -Pulmonology considering palliative thoracentesis if needed -As needed p.o. morphine  for moderate pain, as needed IV morphine  for severe pain, may need to transition to infusion -As needed Ativan  for agitation -Supportive care -Continue with IV fluids  Hyponatremia > Suspected hypovolemic hypoosmolar hyponatremia.  Received bolus in the ED. - Continue with gentle IV fluids.  Comfort care.  Hypertension - Currently unable to tolerate antihypertensives and blood  pressure is low normal and newly  History of DVT - Hold off on  anticoagulation given comfort measures  Chronic diastolic CHF > Echo in October with EF 55%, G1 DD, normal RV function. - Holding torsemide  in setting of hyponatremia and low normal blood pressure. - Comfort care  DVT prophylaxis: None, comfort care Code Status:   DNR/DNI, comfort care Family Communication:  Updated by phone, they previously discussed her care with pulmonology/critical care provider. Disposition Plan:   Patient is from:  Home  Anticipated DC to:  Home versus hospice versus in hospital death  Anticipated DC date:  1 to 7 days  Anticipated DC barriers: None  Consults called:  Critical care consulted in the ED, considering palliative thoracentesis.  Palliative medicine consult order placed Admission status:  Observation, palliative  Severity of Illness: The appropriate patient status for this patient is OBSERVATION. Observation status is judged to be reasonable and necessary in order to provide the required intensity of service to ensure the patient's safety. The patient's presenting symptoms, physical exam findings, and initial radiographic and laboratory data in the context of their medical condition is felt to place them at decreased risk for further clinical deterioration. Furthermore, it is anticipated that the patient will be medically stable for discharge from the hospital within 2 midnights of admission.    Marsa KATHEE Scurry MD [facility]  How to contact the [facility] Attending or Consulting provider 7A - 7P or covering provider during after hours 7P -7A, for this patient?   Check the care team in [facility] and look for a) attending/consulting [facility] provider listed and b) the [facility] team listed Log into [email or web address] and use [place]'s universal password to access. If you do not have the password, please contact the hospital operator. Locate the [facility] provider you are looking for under [facility] and page to a number that you can be directly reached. If you still  have difficulty reaching the provider, please page the [facility] (Director on Call) for the Hospitalists listed on amion for assistance.  09/10/2023, 5:43 PM

## 2023-09-10 NOTE — ED Triage Notes (Signed)
 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 Has complained of shortness of breath for weeks but during the night and this morning has been worse, unrelieved by prescribed pain medication, breathing exercises, or other. SpO2 83% on arrival to triage, but this improved as family coached her through breathing. Has not taken any medications (including chemo pill) except pain meds in the last three days due to difficulty swallowing and feeling like she will vomit any time she eats anything. Family states also new confusion x 1 week.

## 2023-09-10 NOTE — Consult Note (Signed)
 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 Recently changed to Axitinib  orally from abometyx and nivolumab  as had evidence of disease progression back in Nov 2024. Also has h/o chronic hyponatremia (baseline Na 121 to 132) Presented to ER 1/1 w/ family reporting significant decline for the last 4 weeks.  Initially started as sporadic decreased p.o. intake, but over the last 2 weeks very significant decreased p.o. intake, with very limited oral intake, increased confusion over the last 2 weeks, several falls, and in the last week was noting difficulty swallowing particularly her larger pills 1 of which was her cancer therapy.  In addition she has had significantly worsening shortness of breath, and over the last few days prior to presentation had become so weak she could no longer walk or even assist with her own care in the bed.  Because of her progressive weakness, increased shortness of breath, and confusion family brought her for evaluation.  In ER eval CXR w/ new patchy right sided airspace disease.  CT chest w/ bilateral effusions and bilateral patchy airspace disease.  Na 109, cl 70, bun 42, cr 1.08, wbc 21,bnp 189, serum osmo 240, u osmo 224.  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 mod AVR, has had prior thoracentesis + malignant cells, chronic hyponatremia, HTN Speaks russian.    Significant Hospital Events: Including procedures, antibiotic start and stop dates in addition to other  pertinent events     Interim History / Subjective:  Reports headache otherwise appears comfortable  Objective   Blood pressure (!) 105/95, pulse 92, temperature 97.6 F (36.4 C), temperature source Oral, resp. rate 18, height 5' 1 (1.549 m), weight 40 kg, SpO2 100%.       No intake or output data in the 24 hours ending 09/10/23 1542 Filed Weights   09/10/23 1536  Weight: 40 kg    Examination: General: Frail cachectic 89 year old female resting in bed she is non-English speaking HENT: Temporal wasting small area of ecchymosis over right temporal area mucous membranes dry and cracked no neck vein distention Lungs: Diminished bases currently 2 L no accessory use CT with bilateral pleural effusions Cardiovascular: Regular rate and rhythm Abdomen: Soft not tender Extremities: Dependent edema Neuro: Awake interactive with family GU: Due to void  Resolved Hospital Problem list     Assessment & Plan:  Stage IV metastatic renal cell carcinoma with metastasis to mediastinum, lung and pleura Malignant pleural effusions Possible Pneumonia Severe dehydration Lactic acidosis Hypoxic respiratory failure Acute metabolic encephalopathy Falls at home Severe malnutrition Adult failure to thrive Severe dehydration Acute on chronic hyponatremia, hypoosmolar hypovolemic type Cancer related pain DO NOT RESUSCITATE status Comfort care status  Discussion This is an 89 year old female patient with stage IV metastatic renal cell carcinoma with mets to the lung and pleura.  She has had progressive disease in spite of therapy, actually followed by hospice at home.  Presenting with hypovolemic hyponatremia, severe dehydration, metabolic encephalopathy, and progressive bilateral pleural effusions  with associated dyspnea.  I believe she is nearing the end of her life as does her family.  They do not wish to push aggressive interventions, nor would this be appropriate.  Plan/recommendation Admit  to MedSurg Would go ahead and give her a liter of saline here in the emergency room followed by gentle continuous infusion, see if this perks her up to Allow p.o. intake as able Limit lab draws  As needed morphine  We will reevaluate her again on 1/2 in regards to her dyspnea and shortness of breath as she may require therapeutic thoracentesis I spoke at length to her son Marolyn.  They understand she is nearing the end of her life   Best Practice (right click and Reselect all SmartList Selections daily)   Per primary Labs   CBC: Recent Labs  Lab 09/10/23 1037  WBC 21.0*  HGB 12.6  HCT 36.2  MCV 82.3  PLT 358    Basic Metabolic Panel: Recent Labs  Lab 09/10/23 1037  NA 109*  K 4.2  CL 70*  CO2 26  GLUCOSE 99  BUN 42*  CREATININE 1.08*  CALCIUM 8.1*   GFR: Estimated Creatinine Clearance: 23.6 mL/min (A) (by C-G formula based on SCr of 1.08 mg/dL (H)). Recent Labs  Lab 09/10/23 1037 09/10/23 1337  WBC 21.0*  --   LATICACIDVEN  --  2.1*    Liver Function Tests: Recent Labs  Lab 09/10/23 1244  AST 31  ALT 12  ALKPHOS 108  BILITOT 0.7  PROT 5.7*  ALBUMIN  2.4*   Recent Labs  Lab 09/10/23 1244  LIPASE 41   Recent Labs  Lab 09/10/23 1308  AMMONIA 13    ABG No results found for: PHART, PCO2ART, PO2ART, HCO3, TCO2, ACIDBASEDEF, O2SAT   Coagulation Profile: No results for input(s): INR, PROTIME in the last 168 hours.  Cardiac Enzymes: No results for input(s): CKTOTAL, CKMB, CKMBINDEX, TROPONINI in the last 168 hours.  HbA1C: No results found for: HGBA1C  CBG: No results for input(s): GLUCAP in the last 168 hours.  Review of Systems:   Not able   Past Medical History:  She,  has a past medical history of Hypertension and Renal cell carcinoma (HCC).   Surgical History:   Past Surgical History:  Procedure Laterality Date   IR THORACENTESIS ASP PLEURAL SPACE W/IMG GUIDE  04/05/2021   IR THORACENTESIS ASP PLEURAL  SPACE W/IMG GUIDE  06/30/2023   THORACENTESIS N/A 04/12/2021   Procedure: MILANA;  Surgeon: Brenna Adine CROME, DO;  Location: [facility];  Service: Pulmonary;  Laterality: N/A;     Social History:   reports that she has never smoked. She has never used smokeless tobacco. She reports that she does not currently use alcohol. She reports that she does not currently use drugs.   Family History:  Her family history is negative for Diabetes and Kidney disease.   Allergies No Known Allergies   Home Medications  Prior to Admission medications   Medication Sig Start Date End Date Taking? Authorizing Provider  acetaminophen  (TYLENOL ) 325 MG tablet Take 650 mg by mouth every 6 (six) hours as needed for headache.    [provider]  amLODipine (NORVASC) 10 MG tablet Take 5 mg by mouth daily. 09/14/21   [provider]  apixaban  (ELIQUIS ) 5 MG TABS tablet Take 1 tablet (5 mg total) by mouth 2 (two) times daily. 07/03/23 10/01/23  Fairy Frames, MD  axitinib  (INLYTA ) 1 MG tablet Take 3 tablets (3 mg total) by  mouth 2 (two) times daily. 07/23/23   Sherrod Sherrod, MD  empagliflozin  (JARDIANCE ) 10 MG TABS tablet Take 1 tablet (10 mg total) by mouth daily. 07/04/23   Fairy Frames, MD  ferrous sulfate  325 (65 FE) MG EC tablet TAKE 1 TABLET BY MOUTH EVERY DAY WITH BREAKFAST Patient taking differently: Take 325 mg by mouth daily with breakfast. 02/09/22   Heilingoetter, Cassandra L, PA-C  HYDROcodone -acetaminophen  (NORCO) 5-325 MG tablet Take 1 tablet by mouth every 6 (six) hours as needed for moderate pain (pain score 4-6). 08/25/23   Sherrod Sherrod, MD  hydrocortisone  (ANUSOL -HC) 2.5 % rectal cream Place 1 Application rectally 2 (two) times daily. 06/24/23   Sherrod Sherrod, MD  Magnesium  250 MG TABS Take 1 tablet by mouth every evening.    [provider]  metoprolol  tartrate (LOPRESSOR ) 50 MG tablet Take 50 mg by mouth 2 (two) times daily. 03/11/22   [provider]  mirtazapine  (REMERON ) 30 MG tablet Take 1 tablet (30 mg total) by mouth at bedtime. 06/24/23   Sherrod Sherrod, MD  Misc. Devices [facility]) MISC Light weight 09/18/21   [provider]  Multiple Vitamins-Minerals (PRESERVISION AREDS 2 PO) Take 1 tablet by mouth in the morning and at bedtime.    [provider]  oxyCODONE  (OXY IR/ROXICODONE ) 5 MG immediate release tablet Take 1 tablet (5 mg total) by mouth every 6 (six) hours as needed for severe pain (pain score 7-10). 08/27/23   Sherrod Sherrod, MD  pantoprazole  (PROTONIX ) 40 MG tablet TAKE 1 TABLET BY MOUTH EVERY DAY 08/02/23   Pickenpack-Cousar, Athena N, NP  torsemide  (DEMADEX ) 20 MG tablet Take 1 tablet (20 mg total) by mouth daily. 07/03/23   Fairy Frames, MD  Valerian Root 100 MG CAPS Take 1 capsule by mouth daily as needed (for anxiety).    [provider]     Critical care time: NA

## 2023-09-10 NOTE — ED Notes (Signed)
 ED TO INPATIENT HANDOFF REPORT  ED Nurse Name and Phone #: [phone]  S Name/Age/Gender Gail Fitzgerald 89 y.o. female Room/Bed: 038C/038C  Code Status   Code Status: Do not attempt resuscitation (DNR) - Comfort care  Home/SNF/Other Home Patient oriented to: self and place Is this baseline? 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 Has complained of shortness of breath for weeks but during the night and this morning has been worse, unrelieved by prescribed pain medication, breathing exercises, or other. SpO2 83% on arrival to triage, but this improved as family coached her through breathing. Has not taken any medications (including chemo pill) except pain meds in the last three days due to difficulty swallowing and feeling like she will vomit any time she eats anything. Family states also new confusion x 1 week.    Allergies No Known Allergies  Level of Care/Admitting Diagnosis ED Disposition     ED Disposition  Admit   Condition  --   Comment  Hospital Area: [facility] [[id number]]  Level of Care: Palliative Care [15]  May place patient in observation at [facility] or [facility] if equivalent level of care is available:: No  Covid Evaluation: Asymptomatic - no recent exposure (last 10 days) testing not required  Diagnosis: Hyponatremia [198519]  Admitting Physician: MELVIN, ALEXANDER B [[id number]]  Attending Physician: MELVIN, ALEXANDER B [[id number]]  Bed request comments: Cornelius Balls preferred          B Medical/Surgery History Past Medical History:  Diagnosis Date   Hypertension    Renal cell carcinoma (HCC)    With mets to the lungs   Past Surgical History:  Procedure Laterality Date   IR THORACENTESIS ASP PLEURAL SPACE W/IMG GUIDE  04/05/2021   IR THORACENTESIS ASP PLEURAL SPACE W/IMG GUIDE  06/30/2023   THORACENTESIS N/A  04/12/2021   Procedure: MILANA;  Surgeon: Brenna Adine CROME, DO;  Location: [facility] END[facility];  Service: Pulmonary;  Laterality: N/A;     A IV Location/Drains/Wounds Patient Lines/Drains/Airways Status     Active Line/Drains/Airways     Name Placement date Placement time Site Days   Peripheral IV 09/10/23 20 G Anterior;Right;Upper Arm 09/10/23  1253  Arm  less than 1            Intake/Output Last 24 hours No intake or output data in the 24 hours ending 09/10/23 1911  Labs/Imaging Results for orders placed or performed during the hospital encounter of 09/10/23 (from the past 48 hours)  Basic metabolic panel     Status: Abnormal   Collection Time: 09/10/23 10:37 AM  Result Value Ref Range   Sodium 109 (LL) 135 - 145 mmol/L    Comment: ATTEMPTED TO CALL @1149  CRITICAL RESULT CALLED TO, READ BACK BY AND VERIFIED WITH N.KOHUT,RN @1216  09/10/2023 VANG.J    Potassium 4.2 3.5 - 5.1 mmol/L   Chloride 70 (L) 98 - 111 mmol/L   CO2 26 22 - 32 mmol/L   Glucose, Bld 99 70 - 99 mg/dL    Comment: Glucose reference range applies only to samples taken after fasting for at least 8 hours.   BUN 42 (H) 8 - 23 mg/dL   Creatinine, Ser 8.91 (H) 0.44 - 1.00 mg/dL   Calcium 8.1 (L) 8.9 - 10.3 mg/dL   GFR, Estimated 50 (L) >60 mL/min    Comment: (NOTE) Calculated using the CKD-EPI Creatinine Equation (  2021)    Anion gap 13 5 - 15    Comment: Performed at [facility], [address], [place], [place] [address]  CBC     Status: Abnormal   Collection Time: 09/10/23 10:37 AM  Result Value Ref Range   WBC 21.0 (H) 4.0 - 10.5 K/uL   RBC 4.40 3.87 - 5.11 MIL/uL   Hemoglobin 12.6 12.0 - 15.0 g/dL   HCT 63.7 63.9 - 53.9 %   MCV 82.3 80.0 - 100.0 fL   MCH 28.6 26.0 - 34.0 pg   MCHC 34.8 30.0 - 36.0 g/dL   RDW 85.3 88.4 - 84.4 %   Platelets 358 150 - 400 K/uL   nRBC 0.0 0.0 - 0.2 %    Comment: Performed at [facility], [address], [place], [place] [address]  Brain natriuretic  peptide     Status: Abnormal   Collection Time: 09/10/23 10:37 AM  Result Value Ref Range   B Natriuretic Peptide 189.2 (H) 0.0 - 100.0 pg/mL    Comment: Performed at [facility], [address] [place], [place] [address]  Hepatic function panel     Status: Abnormal   Collection Time: 09/10/23 12:44 PM  Result Value Ref Range   Total Protein 5.7 (L) 6.5 - 8.1 g/dL   Albumin  2.4 (L) 3.5 - 5.0 g/dL   AST 31 15 - 41 U/L   ALT 12 0 - 44 U/L   Alkaline Phosphatase 108 38 - 126 U/L   Total Bilirubin 0.7 0.0 - 1.2 mg/dL   Bilirubin, Direct 0.3 (H) 0.0 - 0.2 mg/dL   Indirect Bilirubin 0.4 0.3 - 0.9 mg/dL    Comment: Performed at [facility], [address], [place], [place] [address]  Lipase, blood     Status: None   Collection Time: 09/10/23 12:44 PM  Result Value Ref Range   Lipase 41 11 - 51 U/L    Comment: Performed at [facility], [address] [place], [place] [address]  Osmolality     Status: Abnormal   Collection Time: 09/10/23 12:57 PM  Result Value Ref Range   Osmolality 240 (LL) 275 - 295 mOsm/kg    Comment: REPEATED TO VERIFY CRITICAL RESULT CALLED TO, READ BACK BY AND VERIFIED WITH: GEANNIE NAM, RN AT 1451 1.1.25 D. BLU Performed at [facility], [address], [place], [place] [address]   Ammonia     Status: None   Collection Time: 09/10/23  1:08 PM  Result Value Ref Range   Ammonia 13 9 - 35 umol/L    Comment: Performed at [facility], [address], [place], [place] [address]  I-Stat CG4 Lactic Acid     Status: Abnormal   Collection Time: 09/10/23  1:37 PM  Result Value Ref Range   Lactic Acid, Venous 2.1 (HH) 0.5 - 1.9 mmol/L   Comment NOTIFIED PHYSICIAN   Urinalysis, w/ Reflex to Culture (Infection Suspected) -Urine, Clean Catch     Status: Abnormal   Collection Time: 09/10/23  2:50 PM  Result Value Ref Range   Specimen Source URINE, CLEAN CATCH    Color, Urine YELLOW YELLOW   APPearance CLEAR CLEAR   Specific Gravity, Urine  1.013 1.005 - 1.030   pH 5.0 5.0 - 8.0   Glucose, UA NEGATIVE NEGATIVE mg/dL   Hgb urine dipstick MODERATE (A) NEGATIVE   Bilirubin Urine NEGATIVE NEGATIVE   Ketones, ur NEGATIVE  NEGATIVE mg/dL   Protein, ur NEGATIVE NEGATIVE mg/dL   Nitrite NEGATIVE NEGATIVE   Leukocytes,Ua SMALL (A) NEGATIVE   RBC / HPF 0-5 0 - 5 RBC/hpf   WBC, UA 6-10 0 - 5 WBC/hpf    Comment:        Reflex urine culture not performed if WBC <=10, OR if Squamous epithelial cells >5. If Squamous epithelial cells >5 suggest recollection.    Bacteria, UA RARE (A) NONE SEEN   Squamous Epithelial / HPF 0-5 0 - 5 /HPF    Comment: Performed at [facility], [address], [place], [place] [address]  Osmolality, urine     Status: Abnormal   Collection Time: 09/10/23  2:50 PM  Result Value Ref Range   Osmolality, Ur 224 (L) 300 - 900 mOsm/kg    Comment: REPEATED TO VERIFY Performed at [facility], [address], [place], [place] [address]   I-Stat CG4 Lactic Acid     Status: Abnormal   Collection Time: 09/10/23  4:13 PM  Result Value Ref Range   Lactic Acid, Venous 3.3 (HH) 0.5 - 1.9 mmol/L   Comment NOTIFIED PHYSICIAN    MR BRAIN WO CONTRAST Result Date: 09/10/2023 CLINICAL DATA:  Stroke, follow-up. EXAM: MRI HEAD WITHOUT CONTRAST TECHNIQUE: Multiplanar, multiecho pulse sequences of the brain and surrounding structures were obtained without intravenous contrast. COMPARISON:  Brain MRI 07/19/2023.  Head CT 09/10/2023. FINDINGS: Brain: No acute infarct or hemorrhage. The previously questioned area of focal hypoattenuation in the medial left thalamus on CT was likely artifact. Stable background mild chronic small-vessel disease. No hydrocephalus or extra-axial collection. No mass or midline shift. No foci of abnormal susceptibility. Vascular: Normal flow voids. Skull and upper cervical spine: Normal marrow signal. Sinuses/Orbits: No acute findings. Other: None. IMPRESSION: No acute intracranial process. The  previously questioned area of focal hypoattenuation in the medial left thalamus on CT was likely artifact. Electronically Signed   By: Ryan Chess M.D.   On: 09/10/2023 17:51   CT Angio Chest PE W and/or Wo Contrast Result Date: 09/10/2023 CLINICAL DATA:  Bowel obstruction suspected; Pulmonary embolism (PE) suspected, high prob, mental status change, hypoxia, nausea right renal cell carcinoma. * Tracking Code: BO * EXAM: CT ANGIOGRAPHY CHEST CT ABDOMEN AND PELVIS WITH CONTRAST TECHNIQUE: Multidetector CT imaging of the chest was performed using the standard protocol during bolus administration of intravenous contrast. Multiplanar CT image reconstructions and MIPs were obtained to evaluate the vascular anatomy. Multidetector CT imaging of the abdomen and pelvis was performed using the standard protocol during bolus administration of intravenous contrast. RADIATION DOSE REDUCTION: This exam was performed according to the departmental dose-optimization program which includes automated exposure control, adjustment of the mA and/or kV according to patient size and/or use of iterative reconstruction technique. CONTRAST:  75mL OMNIPAQUE  IOHEXOL  350 MG/ML SOLN COMPARISON:  07/14/2023 CT chest, abdomen and pelvis. FINDINGS: CTA CHEST FINDINGS Cardiovascular: The study is moderate quality for the evaluation of pulmonary embolism, limited by motion degradation. There are no filling defects in the central, lobar, segmental or subsegmental pulmonary artery branches to suggest acute pulmonary embolism. Atherosclerotic thoracic aorta with dilated 4.2 cm ascending thoracic aorta. Normal caliber pulmonary arteries. Normal heart size. No significant pericardial fluid/thickening. Three-vessel coronary atherosclerosis. Mediastinum/Nodes: No significant thyroid  nodules. Mildly patulous thoracic esophagus with air-fluid level. No pathologically enlarged axillary, mediastinal or hilar lymph nodes. Lungs/Pleura: No pneumothorax.  Moderate layering right pleural effusion, increased from prior chest CT. Marked irregular diffuse left  pleural thickening and enhancement up to 24 mm thickness medially, not appreciably changed. Loculated small left pleural effusion component is unchanged. Stable partially calcified 1.2 cm peripheral apical right upper lobe pulmonary nodule (series 5/image 11). Patchy consolidation, ground-glass opacity and volume loss throughout the left lung is is not appreciably changed. Patchy ground-glass opacity and interlobular septal thickening throughout the right lung is worsened. No new significant discrete pulmonary nodules in the aerated portions of the lungs. Musculoskeletal: No aggressive appearing focal osseous lesions. Diffuse osteopenia. Mild T7 vertebral compression fracture with is new from 07/14/2023 CT. Moderate thoracic spondylosis. Review of the MIP images confirms the above findings. CT ABDOMEN and PELVIS FINDINGS Hepatobiliary: Normal liver with no liver mass. Cholelithiasis. Distended gallbladder (4.5 cm diameter). No gallbladder wall thickening. No biliary ductal dilatation. Pancreas: Normal, with no mass or duct dilation. Spleen: Normal size. No mass. Adrenals/Urinary Tract: Normal adrenals. Minimally complex thinly septated 4.0 cm upper left renal cyst, for which no follow-up imaging is recommended. Heterogeneously enhancing solid 3.3 x 3.1 cm anterior lower left renal exophytic cortical mass (series 3/image 33), unchanged. No right renal masses. No hydronephrosis. Normal bladder. Stomach/Bowel: Normal non-distended stomach. Normal caliber small bowel with no small bowel wall thickening. Appendix not discretely visualized. Collapsed large-bowel. Moderate sigmoid diverticulosis with no definite large bowel wall thickening. Vascular/Lymphatic: Atherosclerotic nonaneurysmal abdominal aorta. Patent portal, splenic, hepatic and renal veins. No pathologically enlarged lymph nodes in the abdomen or pelvis.  Reproductive: Grossly normal uterus.  No adnexal mass. Other: No pneumoperitoneum. Diffuse omental soft tissue caking (series 3/image 60), worsened from prior CT. Small volume ascites. No focal fluid collections. Mild to moderate anasarca. Musculoskeletal: No aggressive appearing focal osseous lesions. Chronic moderate L1 and mild L4 vertebral compression fractures are unchanged. Diffuse osteopenia. Moderate lumbar spondylosis. Mildly angulated nondisplaced fracture of the lower sacrum (series 7/image 82), new from 07/14/2023 CT. Review of the MIP images confirms the above findings. IMPRESSION: 1. Mild T7 vertebral compression fracture, new from 07/14/2023 CT. Mildly angulated nondisplaced fracture of the lower sacrum, new from 07/14/2023 CT. Chronic moderate L1 and mild L4 vertebral compression fractures, unchanged. Diffuse osteopenia. 2. No pulmonary embolism. 3. Moderate layering right pleural effusion, increased from prior chest CT. Marked irregular diffuse left pleural thickening and enhancement, not appreciably changed, compatible with extensive left pleural metastatic disease. Loculated small left pleural effusion component is unchanged. 4. Patchy consolidation, ground-glass opacity and volume loss throughout the left lung is not appreciably changed. Patchy ground-glass opacity and interlobular septal thickening throughout the right lung is worsened. Findings may represent a combination of pulmonary edema, pneumonia or lymphangitic tumor spread. 5. Heterogeneously enhancing solid 3.3 cm anterior lower left renal exophytic cortical mass, unchanged, compatible with renal cell carcinoma. 6. Diffuse omental soft tissue caking, worsened from prior CT, compatible with extensive peritoneal carcinomatosis. Small volume ascites. 7. Mild to moderate anasarca. 8. Cholelithiasis. Distended gallbladder (4.5 cm diameter). No gallbladder wall thickening. No biliary ductal dilatation. 9.  Aortic Atherosclerosis  (ICD10-I70.0). Electronically Signed   By: Selinda DELENA Blue M.D.   On: 09/10/2023 15:10   CT ABDOMEN PELVIS W CONTRAST Result Date: 09/10/2023 CLINICAL DATA:  Bowel obstruction suspected; Pulmonary embolism (PE) suspected, high prob, mental status change, hypoxia, nausea right renal cell carcinoma. * Tracking Code: BO * EXAM: CT ANGIOGRAPHY CHEST CT ABDOMEN AND PELVIS WITH CONTRAST TECHNIQUE: Multidetector CT imaging of the chest was performed using the standard protocol during bolus administration of intravenous contrast. Multiplanar CT image reconstructions and MIPs were obtained to evaluate  the vascular anatomy. Multidetector CT imaging of the abdomen and pelvis was performed using the standard protocol during bolus administration of intravenous contrast. RADIATION DOSE REDUCTION: This exam was performed according to the departmental dose-optimization program which includes automated exposure control, adjustment of the mA and/or kV according to patient size and/or use of iterative reconstruction technique. CONTRAST:  75mL OMNIPAQUE  IOHEXOL  350 MG/ML SOLN COMPARISON:  07/14/2023 CT chest, abdomen and pelvis. FINDINGS: CTA CHEST FINDINGS Cardiovascular: The study is moderate quality for the evaluation of pulmonary embolism, limited by motion degradation. There are no filling defects in the central, lobar, segmental or subsegmental pulmonary artery branches to suggest acute pulmonary embolism. Atherosclerotic thoracic aorta with dilated 4.2 cm ascending thoracic aorta. Normal caliber pulmonary arteries. Normal heart size. No significant pericardial fluid/thickening. Three-vessel coronary atherosclerosis. Mediastinum/Nodes: No significant thyroid  nodules. Mildly patulous thoracic esophagus with air-fluid level. No pathologically enlarged axillary, mediastinal or hilar lymph nodes. Lungs/Pleura: No pneumothorax. Moderate layering right pleural effusion, increased from prior chest CT. Marked irregular diffuse left  pleural thickening and enhancement up to 24 mm thickness medially, not appreciably changed. Loculated small left pleural effusion component is unchanged. Stable partially calcified 1.2 cm peripheral apical right upper lobe pulmonary nodule (series 5/image 11). Patchy consolidation, ground-glass opacity and volume loss throughout the left lung is is not appreciably changed. Patchy ground-glass opacity and interlobular septal thickening throughout the right lung is worsened. No new significant discrete pulmonary nodules in the aerated portions of the lungs. Musculoskeletal: No aggressive appearing focal osseous lesions. Diffuse osteopenia. Mild T7 vertebral compression fracture with is new from 07/14/2023 CT. Moderate thoracic spondylosis. Review of the MIP images confirms the above findings. CT ABDOMEN and PELVIS FINDINGS Hepatobiliary: Normal liver with no liver mass. Cholelithiasis. Distended gallbladder (4.5 cm diameter). No gallbladder wall thickening. No biliary ductal dilatation. Pancreas: Normal, with no mass or duct dilation. Spleen: Normal size. No mass. Adrenals/Urinary Tract: Normal adrenals. Minimally complex thinly septated 4.0 cm upper left renal cyst, for which no follow-up imaging is recommended. Heterogeneously enhancing solid 3.3 x 3.1 cm anterior lower left renal exophytic cortical mass (series 3/image 33), unchanged. No right renal masses. No hydronephrosis. Normal bladder. Stomach/Bowel: Normal non-distended stomach. Normal caliber small bowel with no small bowel wall thickening. Appendix not discretely visualized. Collapsed large-bowel. Moderate sigmoid diverticulosis with no definite large bowel wall thickening. Vascular/Lymphatic: Atherosclerotic nonaneurysmal abdominal aorta. Patent portal, splenic, hepatic and renal veins. No pathologically enlarged lymph nodes in the abdomen or pelvis. Reproductive: Grossly normal uterus.  No adnexal mass. Other: No pneumoperitoneum. Diffuse omental soft  tissue caking (series 3/image 60), worsened from prior CT. Small volume ascites. No focal fluid collections. Mild to moderate anasarca. Musculoskeletal: No aggressive appearing focal osseous lesions. Chronic moderate L1 and mild L4 vertebral compression fractures are unchanged. Diffuse osteopenia. Moderate lumbar spondylosis. Mildly angulated nondisplaced fracture of the lower sacrum (series 7/image 82), new from 07/14/2023 CT. Review of the MIP images confirms the above findings. IMPRESSION: 1. Mild T7 vertebral compression fracture, new from 07/14/2023 CT. Mildly angulated nondisplaced fracture of the lower sacrum, new from 07/14/2023 CT. Chronic moderate L1 and mild L4 vertebral compression fractures, unchanged. Diffuse osteopenia. 2. No pulmonary embolism. 3. Moderate layering right pleural effusion, increased from prior chest CT. Marked irregular diffuse left pleural thickening and enhancement, not appreciably changed, compatible with extensive left pleural metastatic disease. Loculated small left pleural effusion component is unchanged. 4. Patchy consolidation, ground-glass opacity and volume loss throughout the left lung is not appreciably changed. Patchy ground-glass  opacity and interlobular septal thickening throughout the right lung is worsened. Findings may represent a combination of pulmonary edema, pneumonia or lymphangitic tumor spread. 5. Heterogeneously enhancing solid 3.3 cm anterior lower left renal exophytic cortical mass, unchanged, compatible with renal cell carcinoma. 6. Diffuse omental soft tissue caking, worsened from prior CT, compatible with extensive peritoneal carcinomatosis. Small volume ascites. 7. Mild to moderate anasarca. 8. Cholelithiasis. Distended gallbladder (4.5 cm diameter). No gallbladder wall thickening. No biliary ductal dilatation. 9.  Aortic Atherosclerosis (ICD10-I70.0). Electronically Signed   By: Selinda DELENA Blue M.D.   On: 09/10/2023 15:10   CT Head Wo Contrast Result  Date: 09/10/2023 CLINICAL DATA:  Head trauma, minor (Age >= 65y). Altered mental status. EXAM: CT HEAD WITHOUT CONTRAST TECHNIQUE: Contiguous axial images were obtained from the base of the skull through the vertex without intravenous contrast. RADIATION DOSE REDUCTION: This exam was performed according to the departmental dose-optimization program which includes automated exposure control, adjustment of the mA and/or kV according to patient size and/or use of iterative reconstruction technique. COMPARISON:  Head CT 10/01/2021.  MRI brain 07/19/2023. FINDINGS: Brain: No acute hemorrhage. Stable background of mild chronic small-vessel disease. New small focus of hypoattenuation in the medial aspect of the left thalamus (axial image 16 series 4), suspicious for infarct. Cortical gray-white differentiation is otherwise preserved. Prominence of the ventricles and sulci within expected range for age. No hydrocephalus or extra-axial collection. No mass effect or midline shift. Vascular: No hyperdense vessel or unexpected calcification. Skull: No calvarial fracture or suspicious bone lesion. Skull base is unremarkable. Sinuses/Orbits: No acute finding. Other: None. IMPRESSION: 1. New small focus of hypoattenuation in the medial aspect of the left thalamus, suspicious for infarct. Consider brain MRI for further evaluation. 2. No acute hemorrhage. Electronically Signed   By: Ryan Chess M.D.   On: 09/10/2023 14:53   DG Chest 2 View Result Date: 09/10/2023 CLINICAL DATA:  Shortness of breath. EXAM: CHEST - 2 VIEW COMPARISON:  06/30/2023 FINDINGS: Lungs are hyperexpanded. Interstitial markings are diffusely coarsened with chronic features. Diffuse airspace disease in the left lung again noted, similar to prior. Left pleural thickening again noted. There is new patchy airspace disease at the right base. Small bilateral pleural effusions noted. The cardio pericardial silhouette is enlarged. Bones are diffusely  demineralized. IMPRESSION: 1. New patchy airspace disease at the right base. Imaging features could be related to atelectasis or developing infiltrate. 2. Persistent diffuse airspace disease in the left lung with diffuse left-sided pleural thickening. 3. Small bilateral pleural effusions. Electronically Signed   By: Camellia Candle M.D.   On: 09/10/2023 11:37    Pending Labs Unresulted Labs (From admission, onward)     Start     Ordered   09/10/23 1258  Sodium, urine, random  Once,   URGENT        09/10/23 1257   09/10/23 1147  Culture, blood (routine x 2)  BLOOD CULTURE X 2,   R (with STAT occurrences)      09/10/23 1146            Vitals/Pain Today's Vitals   09/10/23 1330 09/10/23 1530 09/10/23 1533 09/10/23 1536  BP: 138/78 (!) 105/95    Pulse: 90 92    Resp: 11 18    Temp:   97.6 F (36.4 C)   TempSrc:   Oral   SpO2: 100% 100%    Weight:    40 kg  Height:    5' 1 (1.549 m)  PainSc:  Isolation Precautions No active isolations  Medications Medications  sodium chloride  flush (NS) 0.9 % injection 3 mL (has no administration in time range)  acetaminophen  (TYLENOL ) tablet 650 mg (has no administration in time range)    Or  acetaminophen  (TYLENOL ) suppository 650 mg (has no administration in time range)  morphine  CONCENTRATE 10 mg / 0.5 ml oral solution 5 mg (has no administration in time range)    Or  morphine  CONCENTRATE 10 mg / 0.5 ml oral solution 5 mg (has no administration in time range)  morphine  (PF) 2 MG/ML injection 1-4 mg (has no administration in time range)  LORazepam  (ATIVAN ) tablet 1 mg (has no administration in time range)    Or  LORazepam  (ATIVAN ) 2 MG/ML concentrated solution 1 mg (has no administration in time range)    Or  LORazepam  (ATIVAN ) injection 1 mg (has no administration in time range)  ondansetron  (ZOFRAN -ODT) disintegrating tablet 4 mg (has no administration in time range)    Or  ondansetron  (ZOFRAN ) injection 4 mg (has no  administration in time range)  LORazepam  (ATIVAN ) injection 1 mg (has no administration in time range)  0.9 %  sodium chloride  infusion (has no administration in time range)  cefTRIAXone  (ROCEPHIN ) 1 g in sodium chloride  0.9 % 100 mL IVPB (0 g Intravenous Stopped 09/10/23 1406)  azithromycin  (ZITHROMAX ) 500 mg in sodium chloride  0.9 % 250 mL IVPB (0 mg Intravenous Stopped 09/10/23 1528)  sodium chloride  0.9 % bolus 500 mL (0 mLs Intravenous Stopped 09/10/23 1528)  iohexol  (OMNIPAQUE ) 350 MG/ML injection 75 mL (75 mLs Intravenous Contrast Given 09/10/23 1400)  sodium chloride  0.9 % bolus 1,000 mL (1,000 mLs Intravenous New Bag/Given 09/10/23 1735)    Mobility non-ambulatory     Focused Assessments Cardiac Assessment Handoff:    No results found for: CKTOTAL, CKMB, CKMBINDEX, TROPONINI No results found for: DDIMER Does the Patient currently have chest pain? No    R Recommendations: See Admitting Provider Note  Report given to:   Additional Notes:

## 2023-09-11 ENCOUNTER — Encounter (HOSPITAL_COMMUNITY): Payer: Self-pay | Admitting: Internal Medicine

## 2023-09-11 DIAGNOSIS — E872 Acidosis, unspecified: Secondary | ICD-10-CM | POA: Diagnosis present

## 2023-09-11 DIAGNOSIS — Z66 Do not resuscitate: Secondary | ICD-10-CM | POA: Diagnosis present

## 2023-09-11 DIAGNOSIS — Z515 Encounter for palliative care: Secondary | ICD-10-CM

## 2023-09-11 DIAGNOSIS — E43 Unspecified severe protein-calorie malnutrition: Secondary | ICD-10-CM | POA: Diagnosis present

## 2023-09-11 DIAGNOSIS — Z7189 Other specified counseling: Secondary | ICD-10-CM

## 2023-09-11 DIAGNOSIS — I110 Hypertensive heart disease with heart failure: Secondary | ICD-10-CM | POA: Diagnosis present

## 2023-09-11 DIAGNOSIS — J910 Malignant pleural effusion: Secondary | ICD-10-CM | POA: Diagnosis present

## 2023-09-11 DIAGNOSIS — C642 Malignant neoplasm of left kidney, except renal pelvis: Secondary | ICD-10-CM | POA: Diagnosis present

## 2023-09-11 DIAGNOSIS — C781 Secondary malignant neoplasm of mediastinum: Secondary | ICD-10-CM | POA: Diagnosis present

## 2023-09-11 DIAGNOSIS — N179 Acute kidney failure, unspecified: Secondary | ICD-10-CM | POA: Diagnosis present

## 2023-09-11 DIAGNOSIS — D630 Anemia in neoplastic disease: Secondary | ICD-10-CM | POA: Diagnosis present

## 2023-09-11 DIAGNOSIS — I5032 Chronic diastolic (congestive) heart failure: Secondary | ICD-10-CM | POA: Diagnosis present

## 2023-09-11 DIAGNOSIS — W19XXXA Unspecified fall, initial encounter: Secondary | ICD-10-CM | POA: Diagnosis present

## 2023-09-11 DIAGNOSIS — J9601 Acute respiratory failure with hypoxia: Secondary | ICD-10-CM | POA: Diagnosis present

## 2023-09-11 DIAGNOSIS — C7800 Secondary malignant neoplasm of unspecified lung: Secondary | ICD-10-CM | POA: Diagnosis present

## 2023-09-11 DIAGNOSIS — E8809 Other disorders of plasma-protein metabolism, not elsewhere classified: Secondary | ICD-10-CM | POA: Diagnosis present

## 2023-09-11 DIAGNOSIS — R64 Cachexia: Secondary | ICD-10-CM | POA: Diagnosis present

## 2023-09-11 DIAGNOSIS — G9341 Metabolic encephalopathy: Secondary | ICD-10-CM | POA: Diagnosis present

## 2023-09-11 DIAGNOSIS — E871 Hypo-osmolality and hyponatremia: Secondary | ICD-10-CM

## 2023-09-11 DIAGNOSIS — E860 Dehydration: Secondary | ICD-10-CM | POA: Diagnosis present

## 2023-09-11 DIAGNOSIS — R627 Adult failure to thrive: Secondary | ICD-10-CM | POA: Diagnosis present

## 2023-09-11 DIAGNOSIS — M4854XA Collapsed vertebra, not elsewhere classified, thoracic region, initial encounter for fracture: Secondary | ICD-10-CM | POA: Diagnosis present

## 2023-09-11 DIAGNOSIS — C786 Secondary malignant neoplasm of retroperitoneum and peritoneum: Secondary | ICD-10-CM | POA: Diagnosis present

## 2023-09-11 DIAGNOSIS — Z681 Body mass index (BMI) 19 or less, adult: Secondary | ICD-10-CM | POA: Diagnosis not present

## 2023-09-11 DIAGNOSIS — J90 Pleural effusion, not elsewhere classified: Secondary | ICD-10-CM | POA: Diagnosis not present

## 2023-09-11 DIAGNOSIS — S3210XA Unspecified fracture of sacrum, initial encounter for closed fracture: Secondary | ICD-10-CM | POA: Diagnosis present

## 2023-09-11 NOTE — Consult Note (Signed)
 Consultation Note Date: 09/11/2023   Patient Name: Gail Fitzgerald  DOB: 08/18/1935  MRN: [id number]  Age / Sex: 89 y.o., female  PCP: [facility] Referring Physician: Bryn Bernardino NOVAK, MD  Reason for Consultation: Establishing goals of care  HPI/Patient Profile: 89 y.o. female  with past medical history of  metastatic renal cell carcinoma (diagnosed ~2.5 years ago) with omental caking, malignant recurrent pleural effusion, CHF, history of DVT, and HTN admitted on 09/10/2023 with failure to thrive and dyspnea.  Found to be hypotensive with AKI and severe hyponatremia.  Following discussions with medical providers family opted to focus on comfort measures only.  PMT consulted to assist.  Clinical Assessment and Goals of Care: I have reviewed medical records including EPIC notes, labs and imaging, received report from Dr. Bryn, assessed the patient and then met with patient's daughter-in-law at bedside to discuss diagnosis prognosis, GOC, EOL wishes, disposition and options.  I introduced Palliative Medicine as specialized medical care for people living with serious illness. It focuses on providing relief from the symptoms and stress of a serious illness. The goal is to improve quality of life for both the patient and the family.  Daughter-in-law confirms they have opted to focus on her comfort.  They understand she is at end-of-life.  We discussed prognosis per daughter-in-law's request.  We reviewed what comfort measures entails.  Reviewed that currently patient is receiving IV fluids and this would not continue at hospice facility, they understand.  They do mention some uncertainty about if they want to go to the hospice facility.  We discussed type of care provided there.  Daughter-in-law shares patient's other son is coming to town tonight and they will discuss their options together as a family make a decision.  Daughter-in-law was  under the impression patient was consistently receiving medications that were causing her to be sleepy but we reviewed she had not received any sedating medications since yesterday and there is some of those medications could be lingering it is more likely that her mental status is related to disease process.  They expressed understanding.  Currently patient appears comfortable without signs of distress.  We discussed options for symptom management as we move forward.  PMT will continue to follow along.  Daughter-in-law provided with contact information for team.  Primary Decision Maker NEXT OF KIN - sons    SUMMARY OF RECOMMENDATIONS   - continue comfort measures - family considering dc to [facility], awaiting another son to arrive to night to discuss with family before decision will be made  Code Status/Advance Care Planning: DNR      Primary Diagnoses: Present on Admission:  Essential hypertension  Anemia of chronic disease  Acute deep vein thrombosis (DVT) of right lower extremity (HCC)  Failure to thrive in adult  Hyponatremia  Renal cell carcinoma, left (HCC)  Chronic bilateral pleural effusions  Cachexia (HCC)  Peritoneal carcinomatosis (HCC)  Hypoalbuminemia  Protein-calorie malnutrition, severe (HCC)  Acute renal failure (HCC)  Lactic acidosis  Acute hypoxic respiratory failure (HCC)  DNR (do not resuscitate)  Malignant pleural effusion   I have reviewed the medical record, interviewed the patient and family, and examined the patient. The following aspects are pertinent.  Past Medical History:  Diagnosis Date   Hypertension    Renal cell carcinoma (HCC)    With mets to the lungs   Social History   Socioeconomic History   Marital status: Widowed    Spouse name: Not on file  Number of children: Not on file   Years of education: Not on file   Highest education level: Not on file  Occupational History   Not on file  Tobacco Use   Smoking status: Never    Smokeless tobacco: Never  Substance and Sexual Activity   Alcohol use: Not Currently   Drug use: Not Currently   Sexual activity: Not Currently  Other Topics Concern   Not on file  Social History Narrative   Not on file   Social Drivers of Health   Financial Resource Strain: Not on file  Food Insecurity: No Food Insecurity (09/10/2023)   Hunger Vital Sign    Worried About Running Out of Food in the Last Year: Never true    Ran Out of Food in the Last Year: Never true  Transportation Needs: No Transportation Needs (09/10/2023)   PRAPARE - Administrator, Civil Service (Medical): No    Lack of Transportation (Non-Medical): No  Physical Activity: Not on file  Stress: Not on file  Social Connections: Patient Unable To Answer (09/10/2023)   Social Connection and Isolation Panel [NHANES]    Frequency of Communication with Friends and Family: Patient unable to answer    Frequency of Social Gatherings with Friends and Family: Patient unable to answer    Attends Religious Services: Patient unable to answer    Active Member of Clubs or Organizations: Patient unable to answer    Attends Banker Meetings: Patient unable to answer    Marital Status: Patient unable to answer   Family History  Problem Relation Age of Onset   Diabetes Neg Hx    Kidney disease Neg Hx    Scheduled Meds:  sodium chloride  flush  3 mL Intravenous Q12H   Continuous Infusions: PRN Meds:.acetaminophen  **OR** acetaminophen , LORazepam  **OR** LORazepam  **OR** LORazepam , LORazepam , morphine  injection, morphine  CONCENTRATE **OR** morphine  CONCENTRATE, ondansetron  **OR** ondansetron  (ZOFRAN ) IV No Known Allergies Review of Systems  Unable to perform ROS: Patient unresponsive    Physical Exam Constitutional:      General: She is not in acute distress.    Appearance: She is ill-appearing.     Comments: Unresponsive cachectic  Pulmonary:     Effort: Pulmonary effort is normal.  Skin:     General: Skin is warm and dry.     Vital Signs: BP 96/73 (BP Location: Right Arm)   Pulse 78   Temp 97.6 F (36.4 C) (Axillary)   Resp 18   Ht 5' 1 (1.549 m)   Wt 40 kg   SpO2 100%   BMI 16.66 kg/m  Pain Scale: 0-10   Pain Score: Asleep   SpO2: SpO2: 100 % O2 Device:SpO2: 100 % O2 Flow Rate: .   IO: Intake/output summary: No intake or output data in the 24 hours ending 09/11/23 1623  LBM:   Baseline Weight: Weight: 40 kg Most recent weight: Weight: 40 kg     Palliative Assessment/Data: PPS 10%     *Please note that this is a verbal dictation therefore any spelling or grammatical errors are due to the Dragon Medical One system interpretation.   Time Total: 55 minutes Time spent includes: Detailed review of medical records (labs, imaging, vital signs), medically appropriate exam, discussion with treatment team, counseling and educating patient, family and/or staff, documenting clinical information, medication management and coordination of care.    Tobey Jama Barnacle, DNP, AGNP-C Palliative Medicine Team [phone] Pager: [phone]

## 2023-09-11 NOTE — Progress Notes (Signed)
 [facility] PROGRESS NOTE  Gail Fitzgerald (DOB: 1934/11/17) FMW:[id number] PCP: [facility]  Brief Narrative: Gail Fitzgerald is an 89 y.o. female with a history of metastatic renal cell carcinoma (diagnosed ~2.5 years ago) with omental caking, malignant recurrent pleural effusion, CHF, history of DVT, HTN, DNR followed by palliative care who presented to the ED on 09/10/2023 with failure to thrive and dyspnea. She's been stating she no longer wants to take medications, has had severely restricted oral intake despite maximal encouragement of a very supportive family. Also growing weaker, falling more when she does get up.   She was hypotensive with AKI, hypoalbuminemia, hypocalcemia, severely hyponatremic (Na 109). CT head suggested infarct not seen on subsequent MRI. Further work up including CTA showed no PE but there was a new T7 compression fracture, sacral fracture, moderate pleural effusion, renal mass, omental caking and advanced metastatic burden from prior, and anasarca.   Subjective: Pt is calm and poorly responsive. Satisfactory control of her symptoms including pain and anxiety and dyspnea per pt's family at bedside.   Objective: BP 96/73 (BP Location: Right Arm)   Pulse 78   Temp 97.6 F (36.4 C) (Axillary)   Resp 18   Ht 5' 1 (1.549 m)   Wt 40 kg   SpO2 100%   BMI 16.66 kg/m   Gen: Elderly cachectic female laying flat with mouth wide open on supplemental oxygen Pulm: Regular rate, normal effort    Assessment & Plan: Principal Problem:   Hyponatremia Active Problems:   Acute deep vein thrombosis (DVT) of right lower extremity (HCC)   Essential hypertension   Renal cell carcinoma, left (HCC)   Chronic bilateral pleural effusions   Anemia of chronic disease   Goals of care, counseling/discussion   Failure to thrive in adult   Cachexia (HCC)   Peritoneal carcinomatosis (HCC)   Hypoalbuminemia   Protein-calorie malnutrition, severe  (HCC)   Acute renal failure (HCC)   Lactic acidosis   Acute hypoxic respiratory failure (HCC)   DNR (do not resuscitate)   Malignant pleural effusion  Elderly female who has outlived the initial prognosis given at time of diagnosis of metastatic cancer has had overall severe decline in recent weeks-months. She now has several severe conditions including cachexia, new bone fractures, critical hyponatremia and renal failure, worsening lactic acidosis, and acute hypoxic respiratory failure due to pulmonary opacities and pleural effusion, known to be malignant.   Conversations with family have demonstrated clarity, and the patient is now on comfort measures in the hospital. Palliative care is following and we will pursue [facility] placement per my discussion with her daughter in law which is concordant with previous conversations between providers and pt's direct family. Given her hx CHF, and current goals of care, will stop IVF.   Bernardino KATHEE Come, MD [facility] [email or web address] 09/11/2023, 12:04 PM

## 2023-09-11 NOTE — Progress Notes (Signed)
 [facility] 3W[facility] Liaison Note  This patient is current with [facility] outpatient palliative program.  It is noted that she has been made comfort care. Reaching out to PMT and AOR to offer support.  Will follow for discharge disposition.  Please call with any questions or concerns.  Thank you, Randine Nail, BSN, [facility] - Inpatient [phone]

## 2023-09-11 NOTE — Consult Note (Signed)
 NAME:  Gail Fitzgerald, MRN:  [id number], DOB:  [birth year], LOS: 0 ADMISSION DATE:  09/10/2023, CONSULTATION DATE:  09/10/23 REFERRING MD:  Neysa, CHIEF COMPLAINT:  hyponatremia    History of Present Illness:  This is a 89 year old female w/ stage IV renal cell carcinoma w/ known mets to mediastinum, and pleural space. Recently changed to Axitinib  orally from abometyx and nivolumab  as had evidence of disease progression back in Nov 2024. Also has h/o chronic hyponatremia (baseline Na 121 to 132) Presented to ER 1/1 w/ family reporting significant decline for the last 4 weeks.  Initially started as sporadic decreased p.o. intake, but over the last 2 weeks very significant decreased p.o. intake, with very limited oral intake, increased confusion over the last 2 weeks, several falls, and in the last week was noting difficulty swallowing particularly her larger pills 1 of which was her cancer therapy.  In addition she has had significantly worsening shortness of breath, and over the last few days prior to presentation had become so weak she could no longer walk or even assist with her own care in the bed.  Because of her progressive weakness, increased shortness of breath, and confusion family brought her for evaluation.  In ER eval CXR w/ new patchy right sided airspace disease.  CT chest w/ bilateral effusions and bilateral patchy airspace disease.  Na 109, cl 70, bun 42, cr 1.08, wbc 21,bnp 189, serum osmo 240, u osmo 224.  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 mod AVR, has had prior thoracentesis + malignant cells, chronic hyponatremia, HTN Speaks russian.    Significant Hospital Events: Including procedures, antibiotic start and stop dates in addition to other  pertinent events     Interim History / Subjective:  She feels comfortable with comfortable respiratory pattern.  Sedated and poorly responsive   Objective   Blood pressure 96/73, pulse 78, temperature 97.6 F (36.4 C), temperature source Axillary, resp. rate 18, height 5' 1 (1.549 m), weight 40 kg, SpO2 100%.       No intake or output data in the 24 hours ending 09/11/23 1214 Filed Weights   09/10/23 1536  Weight: 40 kg    Examination: General: Cachectic frail woman in bed, comfortable respiratory pattern HENT: Temporal wasting, oropharynx dry Lungs: Decreased bilaterally, no crackles or wheezes.  Nasal cannula O2 in place Cardiovascular: Regular, no murmur Abdomen: Nondistended with positive bowel sounds Extremities: Mild dependent edema Neuro: Wakes to voice, tracks, interacts with family  Resolved Hospital Problem list     Assessment & Plan:  Stage IV metastatic renal cell carcinoma with metastasis to mediastinum, lung and pleura Malignant pleural effusions Possible Pneumonia Severe dehydration Lactic acidosis Hypoxic respiratory failure Acute metabolic encephalopathy Falls at home Severe malnutrition Adult failure to thrive Severe dehydration Acute on chronic hyponatremia, hypoosmolar hypovolemic type Cancer related pain DO NOT RESUSCITATE status Comfort care status  Discussion This is an 89 year old female patient with stage IV metastatic renal cell carcinoma with mets to the lung and pleura.  She has had progressive disease in spite of therapy, followed by hospice at home.  Presenting with hypovolemic hyponatremia, severe dehydration, metabolic encephalopathy, and progressive bilateral pleural effusions with associated dyspnea.  I believe she is nearing the end of  her life as does her family.  They do not wish to push aggressive interventions, nor would this be appropriate.  Plan/recommendation -No clear utility to be head from thoracentesis as her  respiratory pattern is comfortable with current management.  Would defer.  Discussed this with her daughter-in-law at bedside -Agree with transition to hospice care, probably going to [facility] (right click and Reselect all SmartList Selections daily)   Per primary Labs   CBC: Recent Labs  Lab 09/10/23 1037  WBC 21.0*  HGB 12.6  HCT 36.2  MCV 82.3  PLT 358    Basic Metabolic Panel: Recent Labs  Lab 09/10/23 1037  NA 109*  K 4.2  CL 70*  CO2 26  GLUCOSE 99  BUN 42*  CREATININE 1.08*  CALCIUM 8.1*   GFR: Estimated Creatinine Clearance: 23.6 mL/min (A) (by C-G formula based on SCr of 1.08 mg/dL (H)). Recent Labs  Lab 09/10/23 1037 09/10/23 1337 09/10/23 1613  WBC 21.0*  --   --   LATICACIDVEN  --  2.1* 3.3*    Liver Function Tests: Recent Labs  Lab 09/10/23 1244  AST 31  ALT 12  ALKPHOS 108  BILITOT 0.7  PROT 5.7*  ALBUMIN  2.4*   Recent Labs  Lab 09/10/23 1244  LIPASE 41   Recent Labs  Lab 09/10/23 1308  AMMONIA 13     Critical care time: NA     Lamar Chris, MD, PhD 09/11/2023, 12:14 PM [facility] [phone] or if no answer before 7:00PM call [phone] For any issues after 7:00PM please call eLink [phone]

## 2023-09-11 NOTE — TOC Initial Note (Signed)
 Transition of Care [facility]) - Initial/Assessment Note    Patient Details  Name: Gail Fitzgerald MRN: [id number] Date of Birth: 09-16-1934  [facility]) CM/SW Contact:    Corean JAYSON Canary, RN Phone Number: 09/11/2023, 11:39 AM  Clinical Narrative:                 Patient presented with End of life care, hospice at home. MD recommending residential hospice,  Is followed by [facility]. They will speak to the patient/ family regarding residential..    Barriers to Discharge: No Barriers Identified   Patient Goals and CMS Choice Patient states their goals for this hospitalization and ongoing recovery are:: Comfort care          Expected Discharge Plan and Services        Possible residential hospice vs home hospice                                      Prior Living Arrangements/Services                       Activities of Daily Living   ADL Screening (condition at time of admission) Independently performs ADLs?: No Does the patient have a NEW difficulty with bathing/dressing/toileting/self-feeding that is expected to last >3 days?: No Does the patient have a NEW difficulty with getting in/out of bed, walking, or climbing stairs that is expected to last >3 days?: No Does the patient have a NEW difficulty with communication that is expected to last >3 days?: No Is the patient deaf or have difficulty hearing?: No Does the patient have difficulty seeing, even when wearing glasses/contacts?: No Does the patient have difficulty concentrating, remembering, or making decisions?: Yes  Permission Sought/Granted                  Emotional Assessment              Admission diagnosis:  Hyponatremia [E87.1] Failure to thrive in adult [R62.7] Patient Active Problem List   Diagnosis Date Noted   Failure to thrive in adult 09/10/2023   Goals of care, counseling/discussion 07/22/2023   Acute deep vein thrombosis (DVT) of right lower  extremity (HCC) 06/30/2023   Hyponatremia 06/30/2023   Hypomagnesemia 06/30/2023   Essential hypertension 06/30/2023   Anemia of chronic disease 06/30/2023   Acute on chronic diastolic CHF (congestive heart failure) (HCC) 06/29/2023   Mouth sores 04/01/2023   Renal cell carcinoma, left (HCC) 05/09/2021   Encounter for antineoplastic immunotherapy 05/09/2021   S/P thoracentesis    Chronic bilateral pleural effusions 03/10/2021   PCP:  [facility] Pharmacy:   [facility] [phone] - [place], [place] - [address] AT CORNER OF [facility] [address] [place] [address] Phone: [phone] Fax: [phone]  [facility] - [facility] [address] [place] [address] Phone: [phone] Fax: [phone]  [facility] [address] [place] [address] Phone: [phone] Fax: [phone]     Social Drivers of Health (SDOH) Social History: SDOH Screenings   Food Insecurity: No Food Insecurity (09/10/2023)  Housing: Low Risk  (09/10/2023)  Transportation Needs: No Transportation Needs (09/10/2023)  Utilities: Not At Risk (09/10/2023)  Social Connections: Patient Unable To Answer (09/10/2023)  Tobacco Use: Low Risk  (06/30/2023)   SDOH Interventions:     Readmission Risk  Interventions     No data to display

## 2023-09-12 DIAGNOSIS — E871 Hypo-osmolality and hyponatremia: Secondary | ICD-10-CM | POA: Diagnosis not present

## 2023-09-12 DIAGNOSIS — J90 Pleural effusion, not elsewhere classified: Secondary | ICD-10-CM

## 2023-09-12 NOTE — Discharge Summary (Signed)
 Physician Discharge Summary   Patient: Gail Fitzgerald MRN: [id number] DOB: 1935/02/17  Admit date:     09/10/2023  Discharge date: 09/12/23  Discharge Physician: Bernardino KATHEE Come   PCP: [facility]   Recommendations at discharge:  Comfort measures  Discharge Diagnoses: Principal Problem:   Hyponatremia Active Problems:   Acute deep vein thrombosis (DVT) of right lower extremity (HCC)   Essential hypertension   Renal cell carcinoma, left (HCC)   Chronic bilateral pleural effusions   Anemia of chronic disease   Goals of care, counseling/discussion   Failure to thrive in adult   Cachexia (HCC)   Peritoneal carcinomatosis (HCC)   Hypoalbuminemia   Protein-calorie malnutrition, severe (HCC)   Acute renal failure (HCC)   Lactic acidosis   Acute hypoxic respiratory failure (HCC)   DNR (do not resuscitate)   Malignant pleural effusion  Hospital Course: Gail Fitzgerald is an 89 y.o. female with a history of metastatic renal cell carcinoma (diagnosed ~2.5 years ago) with omental caking, malignant recurrent pleural effusion, CHF, history of DVT, HTN, DNR followed by palliative care who presented to the ED on 09/10/2023 with failure to thrive and dyspnea. She's been stating she no longer wants to take medications, has had severely restricted oral intake despite maximal encouragement of a very supportive family. Also growing weaker, falling more when she does get up.    She was hypotensive with AKI, hypoalbuminemia, hypocalcemia, severely hyponatremic (Na 109). CT head suggested infarct not seen on subsequent MRI. Further work up including CTA showed no PE but there was a new T7 compression fracture, sacral fracture, moderate pleural effusion, renal mass, omental caking and advanced metastatic burden from prior, and anasarca.   Subjective: Has received no medications since ativan  1mg  po at 23:06 on 1/1. Son at bedside today. Stating his brother is coming in to discuss  next step for their mother. Well aware of clinical situation and her eligibility to either be discharged home with hospice care or to [facility].    Objective: BP 104/81 (BP Location: Right Arm)   Pulse 91   Temp (!) 97.3 F (36.3 C) (Oral)   Resp 15   Ht 5' 1 (1.549 m)   Wt 40 kg   SpO2 93%   BMI 16.66 kg/m   Elderly cachectic female edentulous, responsive, in no distress, breathing comfortably.    Assessment & Plan: Elderly female who has outlived the initial prognosis given at time of diagnosis of metastatic cancer has had overall severe decline in recent weeks-months. She now has several severe conditions including cachexia, new bone fractures, critical hyponatremia and renal failure, worsening lactic acidosis, and acute hypoxic respiratory failure due to pulmonary opacities and pleural effusion, known to be malignant.    Conversations with family have demonstrated clarity, and the patient is now on comfort measures in the hospital. Palliative care is following and we are continuing discussions around disposition, they have opted for residential hospice. Her oral intake is so limited that she would qualify for residential hospice. We will continue to support them through the process. Continue prn medications as ordered, though none have been needed of late. PCCM feels no indication to put her through thoracentesis which I agree with.   Consultants: Palliative care Procedures performed: None  Disposition: Residential hospice Diet recommendation: Unrestricted DISCHARGE MEDICATION: Allergies as of 09/12/2023   No Known Allergies      Medication List     STOP taking these medications    acetaminophen  325  MG tablet Commonly known as: TYLENOL    amLODipine 10 MG tablet Commonly known as: NORVASC   benazepril  20 MG tablet Commonly known as: LOTENSIN    Eliquis  5 MG Tabs tablet Generic drug: apixaban    empagliflozin  10 MG Tabs tablet Commonly known as: JARDIANCE     HYDROcodone -acetaminophen  5-325 MG tablet Commonly known as: Norco   hydrocortisone  2.5 % rectal cream Commonly known as: Anusol -HC   Inlyta  1 MG tablet Generic drug: axitinib    Magnesium  250 MG Tabs   metoprolol  tartrate 50 MG tablet Commonly known as: LOPRESSOR    mirtazapine  30 MG tablet Commonly known as: Remeron    pantoprazole  40 MG tablet Commonly known as: PROTONIX    torsemide  20 MG tablet Commonly known as: DEMADEX    Valerian Root 100 MG Caps       TAKE these medications    oxyCODONE  5 MG immediate release tablet Commonly known as: Oxy IR/ROXICODONE  Take 1 tablet (5 mg total) by mouth every 6 (six) hours as needed for severe pain (pain score 7-10).       Condition at discharge: Stable for transport, prognosis guarded, life expectancy: days.  The results of significant diagnostics from this hospitalization (including imaging, microbiology, ancillary and laboratory) are listed below for reference.   Imaging Studies: MR BRAIN WO CONTRAST Result Date: 09/10/2023 CLINICAL DATA:  Stroke, follow-up. EXAM: MRI HEAD WITHOUT CONTRAST TECHNIQUE: Multiplanar, multiecho pulse sequences of the brain and surrounding structures were obtained without intravenous contrast. COMPARISON:  Brain MRI 07/19/2023.  Head CT 09/10/2023. FINDINGS: Brain: No acute infarct or hemorrhage. The previously questioned area of focal hypoattenuation in the medial left thalamus on CT was likely artifact. Stable background mild chronic small-vessel disease. No hydrocephalus or extra-axial collection. No mass or midline shift. No foci of abnormal susceptibility. Vascular: Normal flow voids. Skull and upper cervical spine: Normal marrow signal. Sinuses/Orbits: No acute findings. Other: None. IMPRESSION: No acute intracranial process. The previously questioned area of focal hypoattenuation in the medial left thalamus on CT was likely artifact. Electronically Signed   By: Jerilynn Feldmeier Chess M.D.   On:  09/10/2023 17:51   CT Angio Chest PE W and/or Wo Contrast Result Date: 09/10/2023 CLINICAL DATA:  Bowel obstruction suspected; Pulmonary embolism (PE) suspected, high prob, mental status change, hypoxia, nausea right renal cell carcinoma. * Tracking Code: BO * EXAM: CT ANGIOGRAPHY CHEST CT ABDOMEN AND PELVIS WITH CONTRAST TECHNIQUE: Multidetector CT imaging of the chest was performed using the standard protocol during bolus administration of intravenous contrast. Multiplanar CT image reconstructions and MIPs were obtained to evaluate the vascular anatomy. Multidetector CT imaging of the abdomen and pelvis was performed using the standard protocol during bolus administration of intravenous contrast. RADIATION DOSE REDUCTION: This exam was performed according to the departmental dose-optimization program which includes automated exposure control, adjustment of the mA and/or kV according to patient size and/or use of iterative reconstruction technique. CONTRAST:  75mL OMNIPAQUE  IOHEXOL  350 MG/ML SOLN COMPARISON:  07/14/2023 CT chest, abdomen and pelvis. FINDINGS: CTA CHEST FINDINGS Cardiovascular: The study is moderate quality for the evaluation of pulmonary embolism, limited by motion degradation. There are no filling defects in the central, lobar, segmental or subsegmental pulmonary artery branches to suggest acute pulmonary embolism. Atherosclerotic thoracic aorta with dilated 4.2 cm ascending thoracic aorta. Normal caliber pulmonary arteries. Normal heart size. No significant pericardial fluid/thickening. Three-vessel coronary atherosclerosis. Mediastinum/Nodes: No significant thyroid  nodules. Mildly patulous thoracic esophagus with air-fluid level. No pathologically enlarged axillary, mediastinal or hilar lymph nodes. Lungs/Pleura: No pneumothorax. Moderate layering right  pleural effusion, increased from prior chest CT. Marked irregular diffuse left pleural thickening and enhancement up to 24 mm thickness  medially, not appreciably changed. Loculated small left pleural effusion component is unchanged. Stable partially calcified 1.2 cm peripheral apical right upper lobe pulmonary nodule (series 5/image 11). Patchy consolidation, ground-glass opacity and volume loss throughout the left lung is is not appreciably changed. Patchy ground-glass opacity and interlobular septal thickening throughout the right lung is worsened. No new significant discrete pulmonary nodules in the aerated portions of the lungs. Musculoskeletal: No aggressive appearing focal osseous lesions. Diffuse osteopenia. Mild T7 vertebral compression fracture with is new from 07/14/2023 CT. Moderate thoracic spondylosis. Review of the MIP images confirms the above findings. CT ABDOMEN and PELVIS FINDINGS Hepatobiliary: Normal liver with no liver mass. Cholelithiasis. Distended gallbladder (4.5 cm diameter). No gallbladder wall thickening. No biliary ductal dilatation. Pancreas: Normal, with no mass or duct dilation. Spleen: Normal size. No mass. Adrenals/Urinary Tract: Normal adrenals. Minimally complex thinly septated 4.0 cm upper left renal cyst, for which no follow-up imaging is recommended. Heterogeneously enhancing solid 3.3 x 3.1 cm anterior lower left renal exophytic cortical mass (series 3/image 33), unchanged. No right renal masses. No hydronephrosis. Normal bladder. Stomach/Bowel: Normal non-distended stomach. Normal caliber small bowel with no small bowel wall thickening. Appendix not discretely visualized. Collapsed large-bowel. Moderate sigmoid diverticulosis with no definite large bowel wall thickening. Vascular/Lymphatic: Atherosclerotic nonaneurysmal abdominal aorta. Patent portal, splenic, hepatic and renal veins. No pathologically enlarged lymph nodes in the abdomen or pelvis. Reproductive: Grossly normal uterus.  No adnexal mass. Other: No pneumoperitoneum. Diffuse omental soft tissue caking (series 3/image 60), worsened from prior CT.  Small volume ascites. No focal fluid collections. Mild to moderate anasarca. Musculoskeletal: No aggressive appearing focal osseous lesions. Chronic moderate L1 and mild L4 vertebral compression fractures are unchanged. Diffuse osteopenia. Moderate lumbar spondylosis. Mildly angulated nondisplaced fracture of the lower sacrum (series 7/image 82), new from 07/14/2023 CT. Review of the MIP images confirms the above findings. IMPRESSION: 1. Mild T7 vertebral compression fracture, new from 07/14/2023 CT. Mildly angulated nondisplaced fracture of the lower sacrum, new from 07/14/2023 CT. Chronic moderate L1 and mild L4 vertebral compression fractures, unchanged. Diffuse osteopenia. 2. No pulmonary embolism. 3. Moderate layering right pleural effusion, increased from prior chest CT. Marked irregular diffuse left pleural thickening and enhancement, not appreciably changed, compatible with extensive left pleural metastatic disease. Loculated small left pleural effusion component is unchanged. 4. Patchy consolidation, ground-glass opacity and volume loss throughout the left lung is not appreciably changed. Patchy ground-glass opacity and interlobular septal thickening throughout the right lung is worsened. Findings may represent a combination of pulmonary edema, pneumonia or lymphangitic tumor spread. 5. Heterogeneously enhancing solid 3.3 cm anterior lower left renal exophytic cortical mass, unchanged, compatible with renal cell carcinoma. 6. Diffuse omental soft tissue caking, worsened from prior CT, compatible with extensive peritoneal carcinomatosis. Small volume ascites. 7. Mild to moderate anasarca. 8. Cholelithiasis. Distended gallbladder (4.5 cm diameter). No gallbladder wall thickening. No biliary ductal dilatation. 9.  Aortic Atherosclerosis (ICD10-I70.0). Electronically Signed   By: Selinda DELENA Blue M.D.   On: 09/10/2023 15:10   CT ABDOMEN PELVIS W CONTRAST Result Date: 09/10/2023 CLINICAL DATA:  Bowel obstruction  suspected; Pulmonary embolism (PE) suspected, high prob, mental status change, hypoxia, nausea right renal cell carcinoma. * Tracking Code: BO * EXAM: CT ANGIOGRAPHY CHEST CT ABDOMEN AND PELVIS WITH CONTRAST TECHNIQUE: Multidetector CT imaging of the chest was performed using the standard protocol during bolus administration of  intravenous contrast. Multiplanar CT image reconstructions and MIPs were obtained to evaluate the vascular anatomy. Multidetector CT imaging of the abdomen and pelvis was performed using the standard protocol during bolus administration of intravenous contrast. RADIATION DOSE REDUCTION: This exam was performed according to the departmental dose-optimization program which includes automated exposure control, adjustment of the mA and/or kV according to patient size and/or use of iterative reconstruction technique. CONTRAST:  75mL OMNIPAQUE  IOHEXOL  350 MG/ML SOLN COMPARISON:  07/14/2023 CT chest, abdomen and pelvis. FINDINGS: CTA CHEST FINDINGS Cardiovascular: The study is moderate quality for the evaluation of pulmonary embolism, limited by motion degradation. There are no filling defects in the central, lobar, segmental or subsegmental pulmonary artery branches to suggest acute pulmonary embolism. Atherosclerotic thoracic aorta with dilated 4.2 cm ascending thoracic aorta. Normal caliber pulmonary arteries. Normal heart size. No significant pericardial fluid/thickening. Three-vessel coronary atherosclerosis. Mediastinum/Nodes: No significant thyroid  nodules. Mildly patulous thoracic esophagus with air-fluid level. No pathologically enlarged axillary, mediastinal or hilar lymph nodes. Lungs/Pleura: No pneumothorax. Moderate layering right pleural effusion, increased from prior chest CT. Marked irregular diffuse left pleural thickening and enhancement up to 24 mm thickness medially, not appreciably changed. Loculated small left pleural effusion component is unchanged. Stable partially calcified  1.2 cm peripheral apical right upper lobe pulmonary nodule (series 5/image 11). Patchy consolidation, ground-glass opacity and volume loss throughout the left lung is is not appreciably changed. Patchy ground-glass opacity and interlobular septal thickening throughout the right lung is worsened. No new significant discrete pulmonary nodules in the aerated portions of the lungs. Musculoskeletal: No aggressive appearing focal osseous lesions. Diffuse osteopenia. Mild T7 vertebral compression fracture with is new from 07/14/2023 CT. Moderate thoracic spondylosis. Review of the MIP images confirms the above findings. CT ABDOMEN and PELVIS FINDINGS Hepatobiliary: Normal liver with no liver mass. Cholelithiasis. Distended gallbladder (4.5 cm diameter). No gallbladder wall thickening. No biliary ductal dilatation. Pancreas: Normal, with no mass or duct dilation. Spleen: Normal size. No mass. Adrenals/Urinary Tract: Normal adrenals. Minimally complex thinly septated 4.0 cm upper left renal cyst, for which no follow-up imaging is recommended. Heterogeneously enhancing solid 3.3 x 3.1 cm anterior lower left renal exophytic cortical mass (series 3/image 33), unchanged. No right renal masses. No hydronephrosis. Normal bladder. Stomach/Bowel: Normal non-distended stomach. Normal caliber small bowel with no small bowel wall thickening. Appendix not discretely visualized. Collapsed large-bowel. Moderate sigmoid diverticulosis with no definite large bowel wall thickening. Vascular/Lymphatic: Atherosclerotic nonaneurysmal abdominal aorta. Patent portal, splenic, hepatic and renal veins. No pathologically enlarged lymph nodes in the abdomen or pelvis. Reproductive: Grossly normal uterus.  No adnexal mass. Other: No pneumoperitoneum. Diffuse omental soft tissue caking (series 3/image 60), worsened from prior CT. Small volume ascites. No focal fluid collections. Mild to moderate anasarca. Musculoskeletal: No aggressive appearing focal  osseous lesions. Chronic moderate L1 and mild L4 vertebral compression fractures are unchanged. Diffuse osteopenia. Moderate lumbar spondylosis. Mildly angulated nondisplaced fracture of the lower sacrum (series 7/image 82), new from 07/14/2023 CT. Review of the MIP images confirms the above findings. IMPRESSION: 1. Mild T7 vertebral compression fracture, new from 07/14/2023 CT. Mildly angulated nondisplaced fracture of the lower sacrum, new from 07/14/2023 CT. Chronic moderate L1 and mild L4 vertebral compression fractures, unchanged. Diffuse osteopenia. 2. No pulmonary embolism. 3. Moderate layering right pleural effusion, increased from prior chest CT. Marked irregular diffuse left pleural thickening and enhancement, not appreciably changed, compatible with extensive left pleural metastatic disease. Loculated small left pleural effusion component is unchanged. 4. Patchy consolidation, ground-glass opacity and  volume loss throughout the left lung is not appreciably changed. Patchy ground-glass opacity and interlobular septal thickening throughout the right lung is worsened. Findings may represent a combination of pulmonary edema, pneumonia or lymphangitic tumor spread. 5. Heterogeneously enhancing solid 3.3 cm anterior lower left renal exophytic cortical mass, unchanged, compatible with renal cell carcinoma. 6. Diffuse omental soft tissue caking, worsened from prior CT, compatible with extensive peritoneal carcinomatosis. Small volume ascites. 7. Mild to moderate anasarca. 8. Cholelithiasis. Distended gallbladder (4.5 cm diameter). No gallbladder wall thickening. No biliary ductal dilatation. 9.  Aortic Atherosclerosis (ICD10-I70.0). Electronically Signed   By: Selinda DELENA Blue M.D.   On: 09/10/2023 15:10   CT Head Wo Contrast Result Date: 09/10/2023 CLINICAL DATA:  Head trauma, minor (Age >= 65y). Altered mental status. EXAM: CT HEAD WITHOUT CONTRAST TECHNIQUE: Contiguous axial images were obtained from the base of  the skull through the vertex without intravenous contrast. RADIATION DOSE REDUCTION: This exam was performed according to the departmental dose-optimization program which includes automated exposure control, adjustment of the mA and/or kV according to patient size and/or use of iterative reconstruction technique. COMPARISON:  Head CT 10/01/2021.  MRI brain 07/19/2023. FINDINGS: Brain: No acute hemorrhage. Stable background of mild chronic small-vessel disease. New small focus of hypoattenuation in the medial aspect of the left thalamus (axial image 16 series 4), suspicious for infarct. Cortical gray-white differentiation is otherwise preserved. Prominence of the ventricles and sulci within expected range for age. No hydrocephalus or extra-axial collection. No mass effect or midline shift. Vascular: No hyperdense vessel or unexpected calcification. Skull: No calvarial fracture or suspicious bone lesion. Skull base is unremarkable. Sinuses/Orbits: No acute finding. Other: None. IMPRESSION: 1. New small focus of hypoattenuation in the medial aspect of the left thalamus, suspicious for infarct. Consider brain MRI for further evaluation. 2. No acute hemorrhage. Electronically Signed   By: Latajah Thuman Chess M.D.   On: 09/10/2023 14:53   DG Chest 2 View Result Date: 09/10/2023 CLINICAL DATA:  Shortness of breath. EXAM: CHEST - 2 VIEW COMPARISON:  06/30/2023 FINDINGS: Lungs are hyperexpanded. Interstitial markings are diffusely coarsened with chronic features. Diffuse airspace disease in the left lung again noted, similar to prior. Left pleural thickening again noted. There is new patchy airspace disease at the right base. Small bilateral pleural effusions noted. The cardio pericardial silhouette is enlarged. Bones are diffusely demineralized. IMPRESSION: 1. New patchy airspace disease at the right base. Imaging features could be related to atelectasis or developing infiltrate. 2. Persistent diffuse airspace disease in the  left lung with diffuse left-sided pleural thickening. 3. Small bilateral pleural effusions. Electronically Signed   By: Camellia Candle M.D.   On: 09/10/2023 11:37    Microbiology: Results for orders placed or performed during the hospital encounter of 09/10/23  Culture, blood (routine x 2)     Status: None (Preliminary result)   Collection Time: 09/10/23 12:44 PM   Specimen: BLOOD LEFT HAND  Result Value Ref Range Status   Specimen Description BLOOD LEFT HAND  Final   Special Requests   Final    BOTTLES DRAWN AEROBIC AND ANAEROBIC Blood Culture results may not be optimal due to an inadequate volume of blood received in culture bottles   Culture   Final    NO GROWTH 2 DAYS Performed at [facility], [address] [place], [place] [address]    Report Status PENDING  Incomplete  Culture, blood (routine x 2)     Status: None (Preliminary result)  Collection Time: 09/10/23 12:44 PM   Specimen: BLOOD LEFT ARM  Result Value Ref Range Status   Specimen Description BLOOD LEFT ARM  Final   Special Requests   Final    BOTTLES DRAWN AEROBIC AND ANAEROBIC Blood Culture results may not be optimal due to an inadequate volume of blood received in culture bottles   Culture   Final    NO GROWTH 2 DAYS Performed at [facility], [address], [place], [place] [address]    Report Status PENDING  Incomplete    Labs: CBC: Recent Labs  Lab 09/10/23 1037  WBC 21.0*  HGB 12.6  HCT 36.2  MCV 82.3  PLT 358   Basic Metabolic Panel: Recent Labs  Lab 09/10/23 1037  NA 109*  K 4.2  CL 70*  CO2 26  GLUCOSE 99  BUN 42*  CREATININE 1.08*  CALCIUM 8.1*   Liver Function Tests: Recent Labs  Lab 09/10/23 1244  AST 31  ALT 12  ALKPHOS 108  BILITOT 0.7  PROT 5.7*  ALBUMIN  2.4*   CBG: No results for input(s): GLUCAP in the last 168 hours.  Discharge time spent: greater than 30 minutes.  Signed: Bernardino KATHEE Come, MD [facility] 09/12/2023

## 2023-09-12 NOTE — Progress Notes (Signed)
 Gail Fitzgerald to be D/C'd  per MD order.  Discussed with Pam at [facility] and all questions fully answered.  VSS, Skin clean, dry and intact without evidence of skin break down, no evidence of skin tears noted.  IV catheter continued per Pam at [facility].  An After Visit Summary was printed and given to the PTAR.

## 2023-09-12 NOTE — Progress Notes (Signed)
 [facility] Liaison Note  Contacted by patient's daughter in law this morning requesting patient be evaluated for [facility] inpatient hospice care. Patient has been evaluated and approved for [facility] by Dr. Norleen Laurence, Hospice provider.   Our social worker will reach out to patient's family to assist with obtaining consents and patient will be discharged to [facility] later today.    Elouise Husband BSN, CHARITY FUNDRAISER, OCN [employer] [phone]

## 2023-09-12 NOTE — TOC Progression Note (Signed)
 Transition of Care [facility]) - Progression Note    Patient Details  Name: Gail Fitzgerald MRN: [id number] Date of Birth: Jun 08, 1935  [facility] CM/SW Contact  Stephane Powell Jansky, RN Phone Number: 09/12/2023, 3:10 PM  Clinical Narrative:     Family has accepted bed at [facility]. [facility] ready for patient to be transferred to them.   PTAR called . Paperwork on chart . Nurse calling report     Barriers to Discharge: No Barriers Identified  Expected Discharge Plan and Services         Expected Discharge Date: 09/12/23                                     Social Determinants of Health (SDOH) Interventions SDOH Screenings   Food Insecurity: No Food Insecurity (09/10/2023)  Housing: Low Risk  (09/10/2023)  Transportation Needs: No Transportation Needs (09/10/2023)  Utilities: Not At Risk (09/10/2023)  Social Connections: Patient Unable To Answer (09/10/2023)  Tobacco Use: Low Risk  (06/30/2023)    Readmission Risk Interventions     No data to display

## 2023-09-12 NOTE — Plan of Care (Signed)
  Problem: Education: Goal: Knowledge of the prescribed therapeutic regimen will improve Outcome: Not Progressing Variance Physical/mental limitations Impact: High   Problem: Pain Management: Goal: General experience of comfort will improve Outcome: Progressing

## 2023-09-12 NOTE — Progress Notes (Signed)
 Daily Progress Note   Patient Name: Gail Fitzgerald       Date: 09/12/2023 DOB: 07-May-1935  Age: 89 y.o. MRN#: [id number] Attending Physician: Bryn Bernardino NOVAK, MD Primary Care Physician: [facility] Admit Date: 09/10/2023  Reason for Consultation/Follow-up: Establishing goals of care  Subjective: No verbal response - fidgeting in bed, DIL at bedside  Length of Stay: 1  Current Medications: Scheduled Meds:  . sodium chloride  flush  3 mL Intravenous Q12H    Continuous Infusions:   PRN Meds: acetaminophen  **OR** acetaminophen , LORazepam  **OR** LORazepam  **OR** LORazepam , LORazepam , morphine  injection, morphine  CONCENTRATE **OR** morphine  CONCENTRATE, ondansetron  **OR** ondansetron  (ZOFRAN ) IV  Physical Exam Constitutional:      General: She is not in acute distress.    Appearance: She is ill-appearing.     Comments: No verbal interaction Cachectic Appears fidgety/slightly uncomfortable  Pulmonary:     Effort: Pulmonary effort is normal.  Skin:    General: Skin is warm and dry.  Neurological:     Mental Status: She is alert.             Vital Signs: BP 104/81 (BP Location: Right Arm)   Pulse 91   Temp (!) 97.3 F (36.3 C) (Oral)   Resp 15   Ht 5' 1 (1.549 m)   Wt 40 kg   SpO2 93%   BMI 16.66 kg/m  SpO2: SpO2: 93 % O2 Device: O2 Device: Room Air O2 Flow Rate:    Intake/output summary:  Intake/Output Summary (Last 24 hours) at 09/12/2023 1423 Last data filed at 09/12/2023 0948 Gross per 24 hour  Intake 106 ml  Output --  Net 106 ml   LBM:   Baseline Weight: Weight: 40 kg Most recent weight: Weight: 40 kg       Palliative Assessment/Data:PpS 10%      Patient Active Problem List   Diagnosis Date Noted  . Cachexia (HCC) 09/11/2023  . Peritoneal  carcinomatosis (HCC) 09/11/2023  . Hypoalbuminemia 09/11/2023  . Protein-calorie malnutrition, severe (HCC) 09/11/2023  . Acute renal failure (HCC) 09/11/2023  . Lactic acidosis 09/11/2023  . Acute hypoxic respiratory failure (HCC) 09/11/2023  . DNR (do not resuscitate) 09/11/2023  . Malignant pleural effusion 09/11/2023  . Failure to thrive in adult 09/10/2023  . Goals of care, counseling/discussion 07/22/2023  . Acute deep vein thrombosis (DVT) of right lower extremity (HCC) 06/30/2023  . Hyponatremia 06/30/2023  . Hypomagnesemia 06/30/2023  . Essential hypertension 06/30/2023  . Anemia of chronic disease 06/30/2023  . Acute on chronic diastolic CHF (congestive heart failure) (HCC) 06/29/2023  . Mouth sores 04/01/2023  . Renal cell carcinoma, left (HCC) 05/09/2021  . Encounter for antineoplastic immunotherapy 05/09/2021  . S/P thoracentesis   . Chronic bilateral pleural effusions 03/10/2021    Palliative Care Assessment & Plan   HPI: 89 y.o. female  with past medical history of  metastatic renal cell carcinoma (diagnosed ~2.5 years ago) with omental caking, malignant recurrent pleural effusion, CHF, history of DVT, and HTN admitted on 09/10/2023 with failure to thrive and dyspnea.  Found to be hypotensive with AKI and severe hyponatremia.  Following discussions with medical providers family opted to focus on comfort measures only.  PMT consulted to assist.   Assessment: Follow up today. Family has decided to move forward with hospice facility - [facility]. On evaluation patient appears uncomfortable, had morphine  ~2 hours ago. Discussed another dose with DIL who agrees - requested from RN.  No PO intake.  Recommendations/Plan: Continue comfort measures - please continue to assess for need for PRN morphine , consider infusion if needs increase Planning for hospice facility once bed is available  Goals of Care and Additional Recommendations: Limitations on Scope of Treatment:  Full Comfort Care  Code Status: DNR  Prognosis:  Hours - Days  Discharge Planning: Hospice facility  Care plan was discussed with DIL, RN  Thank you for allowing the Palliative Medicine Team to assist in the care of this patient.   Total Time 25 minutes Prolonged Time Billed  no   Time spent includes: Detailed review of medical records (labs, imaging, vital signs), medically appropriate exam, discussion with treatment team, counseling and educating patient, family and/or staff, documenting clinical information, medication management and coordination of care.     *Please note that this is a verbal dictation therefore any spelling or grammatical errors are due to the Dragon Medical One system interpretation.  Tobey Jama Barnacle, DNP, [facility] Palliative Medicine Team Team Phone # [phone]  Pager [phone]

## 2023-09-12 NOTE — Progress Notes (Signed)
 [facility] PROGRESS NOTE  Jazzmine Kleiman (DOB: [birth year]) FMW:[id number] PCP: [facility]  Brief Narrative: Gail Fitzgerald is an 89 y.o. female with a history of metastatic renal cell carcinoma (diagnosed ~2.5 years ago) with omental caking, malignant recurrent pleural effusion, CHF, history of DVT, HTN, DNR followed by palliative care who presented to the ED on 09/10/2023 with failure to thrive and dyspnea. She's been stating she no longer wants to take medications, has had severely restricted oral intake despite maximal encouragement of a very supportive family. Also growing weaker, falling more when she does get up.   She was hypotensive with AKI, hypoalbuminemia, hypocalcemia, severely hyponatremic (Na 109). CT head suggested infarct not seen on subsequent MRI. Further work up including CTA showed no PE but there was a new T7 compression fracture, sacral fracture, moderate pleural effusion, renal mass, omental caking and advanced metastatic burden from prior, and anasarca.   Subjective: Has received no medications since ativan  1mg  po at 23:06 on 1/1. Son at bedside today. Stating his brother is coming in to discuss next step for their mother. Well aware of clinical situation and her eligibility to either be discharged home with hospice care or to [facility].   Objective: BP 104/81 (BP Location: Right Arm)   Pulse 91   Temp (!) 97.3 F (36.3 C) (Oral)   Resp 15   Ht 5' 1 (1.549 m)   Wt 40 kg   SpO2 93%   BMI 16.66 kg/m   Elderly cachectic female edentulous, responsive, in no distress, breathing comfortably.   Assessment & Plan: Elderly female who has outlived the initial prognosis given at time of diagnosis of metastatic cancer has had overall severe decline in recent weeks-months. She now has several severe conditions including cachexia, new bone fractures, critical hyponatremia and renal failure, worsening lactic acidosis, and acute hypoxic  respiratory failure due to pulmonary opacities and pleural effusion, known to be malignant.   Conversations with family have demonstrated clarity, and the patient is now on comfort measures in the hospital. Palliative care is following and we are continuing discussions around disposition. Her oral intake is so limited that she would qualify for residential hospice. We will continue to support them through the process. Continue prn medications as ordered, though none have been needed of late. PCCM feels no indication to put her through thoracentesis which I agree with.    Bernardino KATHEE Come, MD [facility] [email or web address] 09/12/2023, 10:25 AM

## 2023-09-12 NOTE — Plan of Care (Signed)
  Problem: Education: Goal: Knowledge of the prescribed therapeutic regimen will improve Outcome: Adequate for Discharge   Problem: Coping: Goal: Ability to identify and develop effective coping behavior will improve Outcome: Adequate for Discharge   Problem: Clinical Measurements: Goal: Quality of life will improve Outcome: Adequate for Discharge   Problem: Respiratory: Goal: Verbalizations of increased ease of respirations will increase Outcome: Adequate for Discharge   Problem: Role Relationship: Goal: Family's ability to cope with current situation will improve Outcome: Adequate for Discharge Goal: Ability to verbalize concerns, feelings, and thoughts to partner or family member will improve Outcome: Adequate for Discharge   Problem: Education: Goal: Knowledge of General Education information will improve Description: Including pain rating scale, medication(s)/side effects and non-pharmacologic comfort measures Outcome: Adequate for Discharge   Problem: Health Behavior/Discharge Planning: Goal: Ability to manage health-related needs will improve Outcome: Adequate for Discharge   Problem: Clinical Measurements: Goal: Ability to maintain clinical measurements within normal limits will improve Outcome: Adequate for Discharge Goal: Will remain free from infection Outcome: Adequate for Discharge Goal: Diagnostic test results will improve Outcome: Adequate for Discharge Goal: Respiratory complications will improve Outcome: Adequate for Discharge Goal: Cardiovascular complication will be avoided Outcome: Adequate for Discharge   Problem: Activity: Goal: Risk for activity intolerance will decrease Outcome: Adequate for Discharge   Problem: Nutrition: Goal: Adequate nutrition will be maintained Outcome: Adequate for Discharge

## 2023-09-13 ENCOUNTER — Other Ambulatory Visit: Payer: Self-pay | Admitting: Internal Medicine

## 2023-09-15 LAB — CULTURE, BLOOD (ROUTINE X 2)
Culture: NO GROWTH
Culture: NO GROWTH

## 2023-09-18 ENCOUNTER — Inpatient Hospital Stay: Payer: Medicaid Other | Admitting: Internal Medicine

## 2023-09-18 ENCOUNTER — Inpatient Hospital Stay: Payer: Medicaid Other

## 2023-09-26 ENCOUNTER — Other Ambulatory Visit: Payer: Self-pay

## 2023-09-26 NOTE — Progress Notes (Signed)
Disenrolled patient from Transport planner. Daughter-In-Law "Erskine Emery" called and stated that the patient had passed away on Sep 20, 2023.

## 2023-10-11 DEATH — deceased

## 2023-12-07 IMAGING — CT CT CHEST-ABD-PELV W/O CM
2 of 5 series · 12 of 46 positions shown, 14 images · non-contrast
Comparison: Chest CT 08/31/2021 and CTs of the chest, abdomen and
pelvis 08/10/2021.

CLINICAL DATA: Metastatic renal cell carcinoma diagnosed in April 2021. Post thoracentesis and immunotherapy.



[Series 6: cap 2.00 br40 s3 cor · coronal · 0.59mm/px · 3 of 147 slices shown]
[im 49/147  soft-tissue]
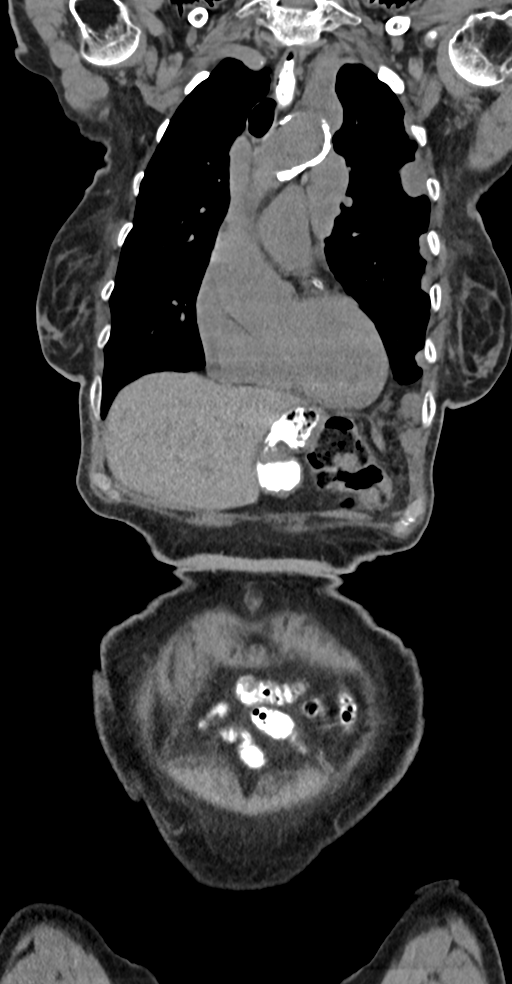
[im 65/147  soft-tissue]
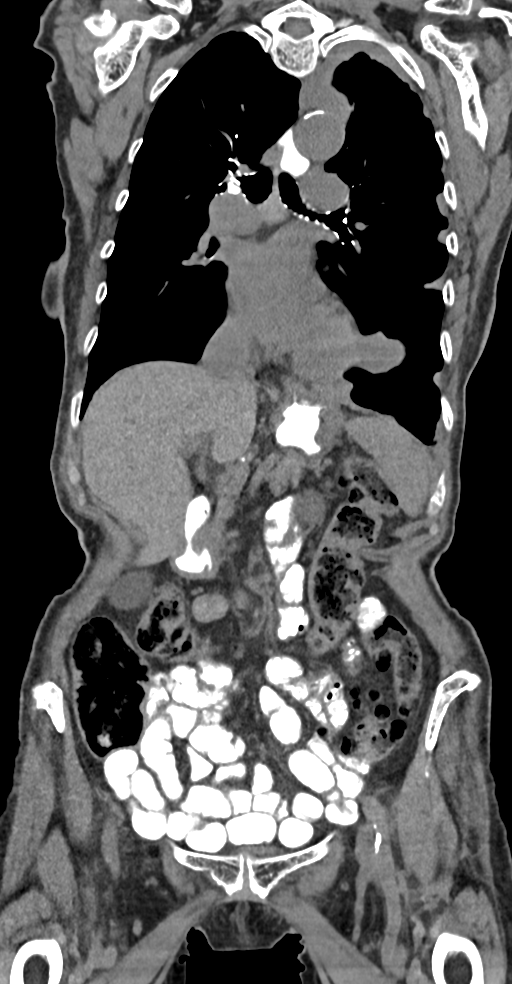
[im 82/147  soft-tissue]
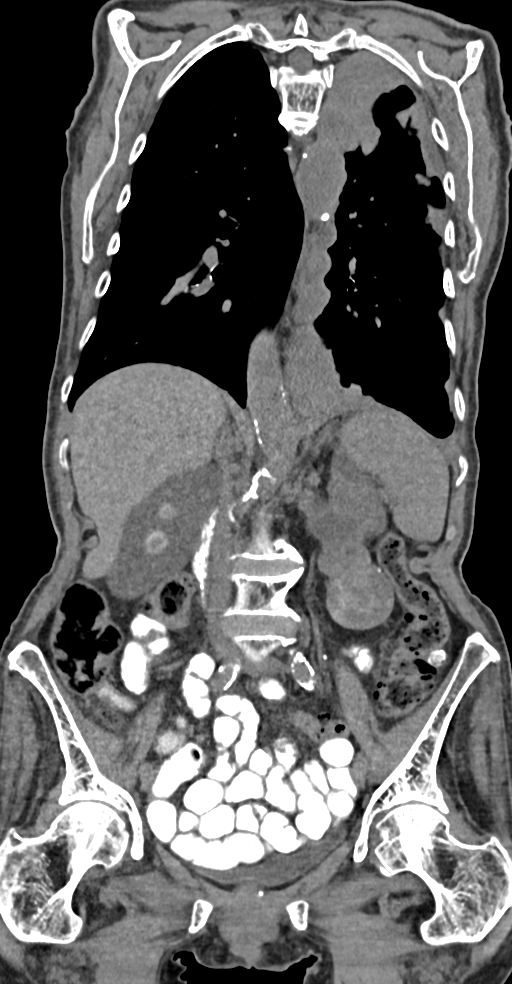

[Series 11: cap 1.00 br40 s3 super d · axial · 0.58mm/px · z∈[+1248,+1720]mm · 9 of 722 slices shown, 11 images]
[im 66/722  soft-tissue]
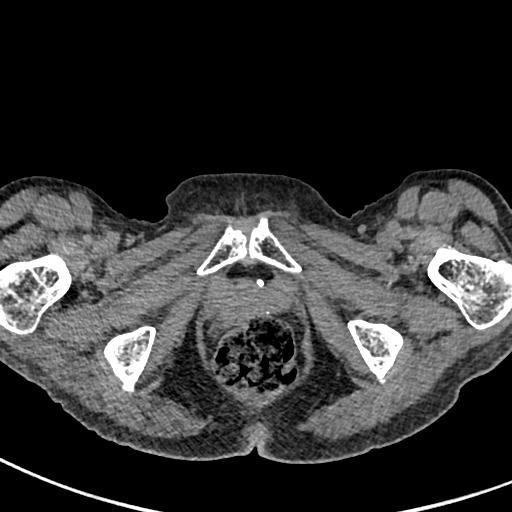
[im 66/722  bone]
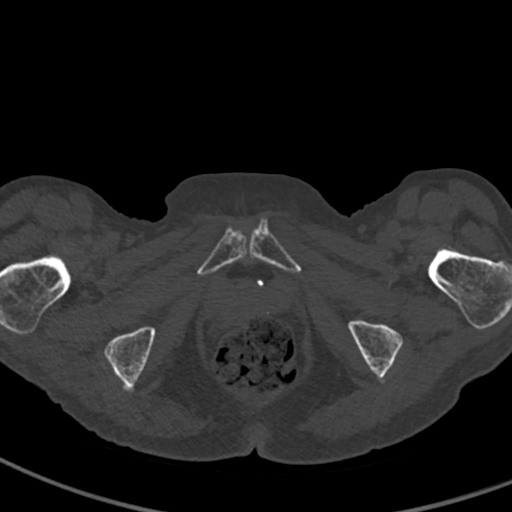
[im 132/722  soft-tissue]
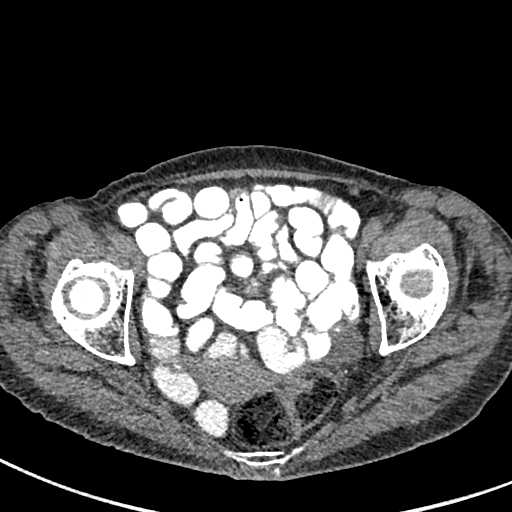
[im 197/722  soft-tissue]
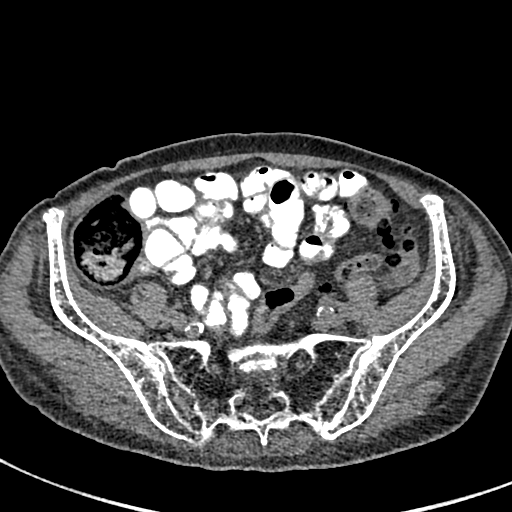
[im 295/722  soft-tissue]
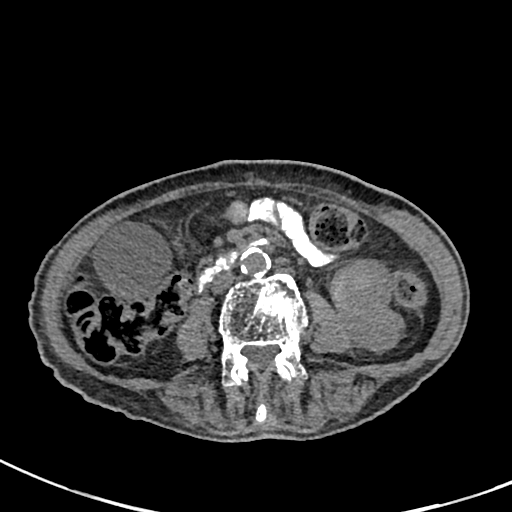
[im 361/722  soft-tissue]
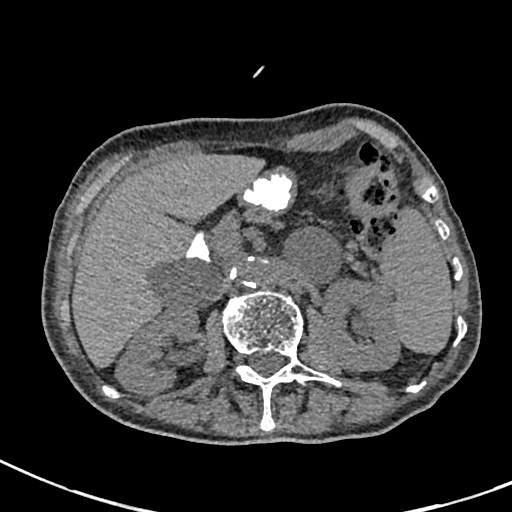
[im 427/722  soft-tissue]
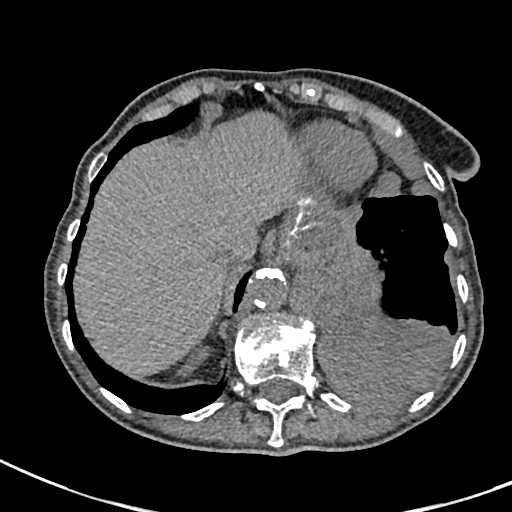
[im 525/722  soft-tissue]
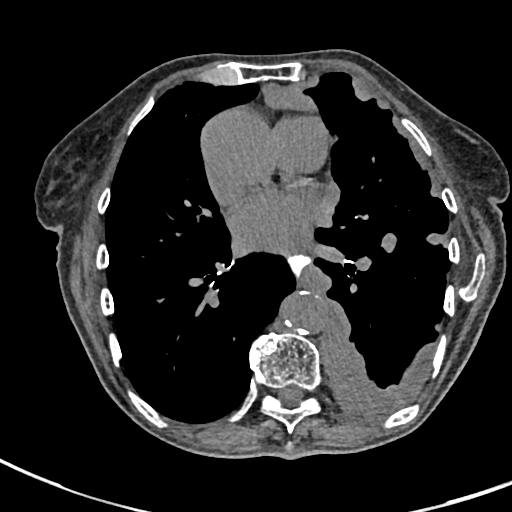
[im 590/722  soft-tissue]
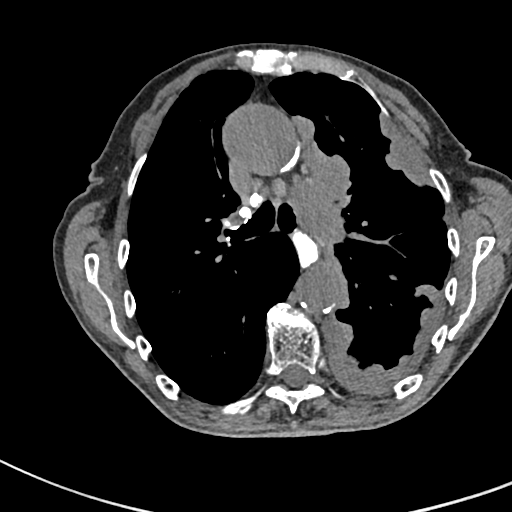
[im 656/722  soft-tissue]
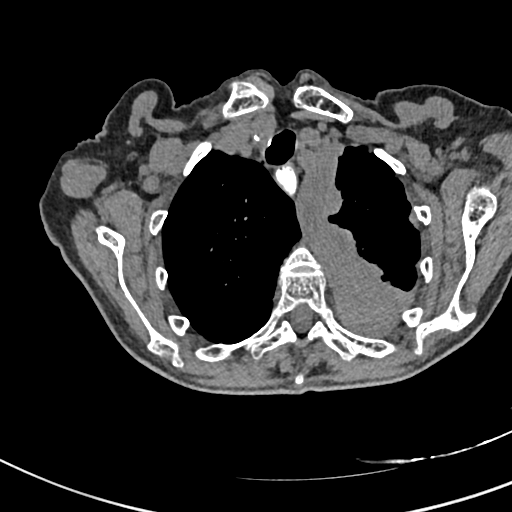
[im 656/722  bone]
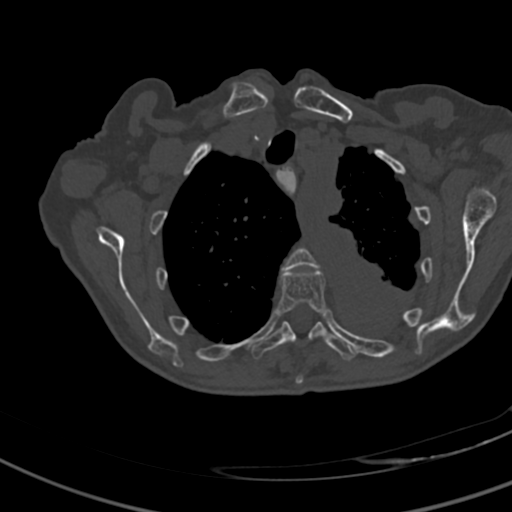

[12 of 46 positions shown; findings below may reference images not displayed]

FINDINGS: CT CHEST FINDINGS

Cardiovascular: Diffuse atherosclerosis of the aorta, great vessels
and coronary arteries again noted. There is stable dilatation of the
ascending aorta which has a maximal diameter 4.4 cm. The heart size
is normal. There is no pericardial effusion.

Mediastinum/Nodes: There are stable small calcified mediastinal and
hilar lymph nodes. No progressive adenopathy identified. There is
contrast material within the thoracic esophagus which appears
patent. A small hiatal hernia is present. The thyroid gland
demonstrates stable nodularity, unlikely to be clinically
significant given additional morbidities. No followup
recommended.(Ref: [HOSPITAL]. [DATE]): 143-50).

Lungs/Pleura: Extensive circumferential pleural tumor is again noted
within the left hemithorax. Overall, this appears slightly improved
compared with the 08/10/2021 study. For example, a rind of tumor
superomedially measures 2.6 cm in maximal thickness on image [DATE]
(previously 3.5 cm). Posteromedially, tumor measures up to 3.3 cm in
thickness on image 44/2 (previously 4.2 cm). No significant
progression identified. There is no pleural effusion or
contralateral right pleural disease. No suspicious pulmonary nodules
are identified. There is stable partially calcified nodular scarring
at the right lung apex. Underlying mild centrilobular emphysema
noted.

Musculoskeletal/Chest wall: No chest wall mass or suspicious osseous
findings.

CT ABDOMEN AND PELVIS FINDINGS

Hepatobiliary: No focal hepatic abnormalities are identified on
noncontrast imaging. Multiple peripherally calcified gallstones are
present the gallbladder is mildly distended, but demonstrates no
wall thickening or surrounding inflammation. No evidence of biliary
dilatation.

Pancreas: Unremarkable. No pancreatic ductal dilatation or
surrounding inflammatory changes.

Spleen: Normal in size without focal abnormality.

Adrenals/Urinary Tract: Both adrenal glands appear stable without
suspicious findings. The right kidney appears unremarkable. Complex
partially calcified masses are again noted within the lower pole of
the left kidney. The more superior component measures 3.5 x 2.9 cm
on image 67/2 (compared with 3.4 x 3.3 cm on previous study,
remeasured). A more inferior component measures 3.9 x 2.8 cm on
image 71/2 (previously 4.1 x 3.5 cm, remeasured). There are
additional simple cysts in the upper pole and interpolar region of
the left kidney which are grossly stable. No evidence of urinary
tract calculus or hydronephrosis. The bladder appears unremarkable
for its degree of distention.

Stomach/Bowel: Enteric contrast was administered and has passed into
the distal small bowel. The stomach appears unremarkable for its
degree of distension. No evidence of bowel wall thickening,
distention or surrounding inflammatory change.

Vascular/Lymphatic: There are no enlarged abdominal or pelvic lymph
nodes. Diffuse aortic and branch vessel atherosclerosis.

Reproductive: The uterus and ovaries appear unremarkable on
noncontrast imaging.

Other: No ascites or peritoneal nodularity.  Intact abdominal wall.

Musculoskeletal: There is a new mild superior endplate compression
fracture at L1 resulting in approximately 20% loss of vertebral body
height. This demonstrates no pathologic features. A chronic superior
endplate compression deformity at L4 is unchanged. There are no
focal lesions suspicious for osseous metastatic disease. The bones
are diffusely demineralized.
IMPRESSION: 1. Previously demonstrated extensive left pleural metastatic disease
has mildly improved in the interval.
2. The complex solid left renal masses have minimally changed.
3. No evidence of progressive metastatic disease.
4. Interval mild superior endplate compression fracture at L1
without pathologic features.
5. Stable additional incidental findings including cholelithiasis,
left renal cysts, coronary and Aortic Atherosclerosis ([id number]).

## 2024-02-07 IMAGING — CT CT CHEST-ABD-PELV W/O CM
2 of 4 series · 12 of 46 positions shown, 14 images · non-contrast
Comparison: CT of the chest, abdomen and pelvis 10/03/2021.

CLINICAL DATA: 86-year-old female with history of renal cell
carcinoma. Follow-up study.

* Tracking Code: BO *
EXAM:
CT CHEST, ABDOMEN AND PELVIS WITHOUT CONTRAST
TECHNIQUE: Multidetector CT imaging of the chest, abdomen and pelvis was
performed following the standard protocol without IV contrast.
RADIATION DOSE REDUCTION: This exam was performed according to the
departmental dose-optimization program which includes automated
exposure control, adjustment of the mA and/or kV according to
patient size and/or use of iterative reconstruction technique.

[Series 2: cap without · axial · non-contrast · 0.65mm/px · z∈[+1163,+1653]mm · 9 of 116 slices shown, 11 images]
[im 9/116  soft-tissue]
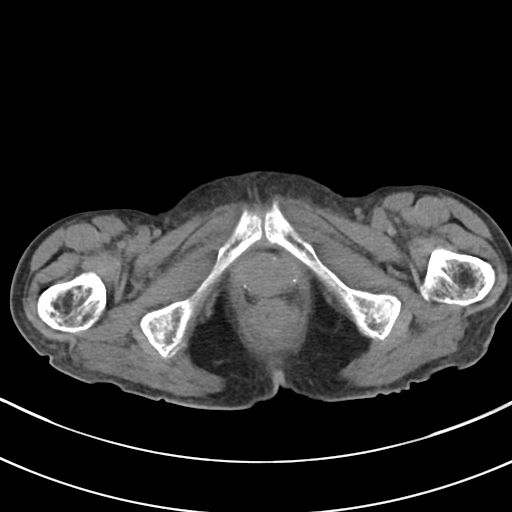
[im 9/116  bone]
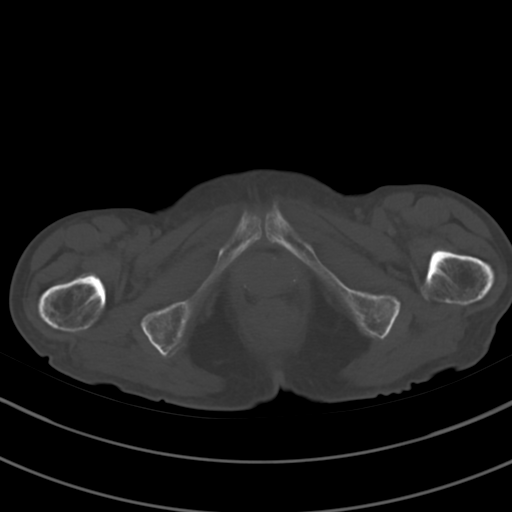
[im 25/116  soft-tissue]
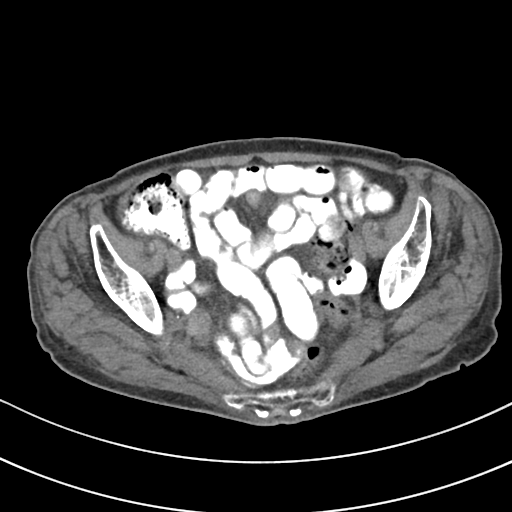
[im 33/116  soft-tissue]
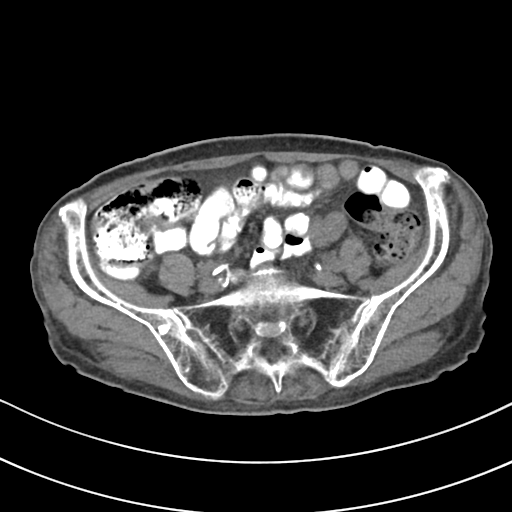
[im 50/116  soft-tissue]
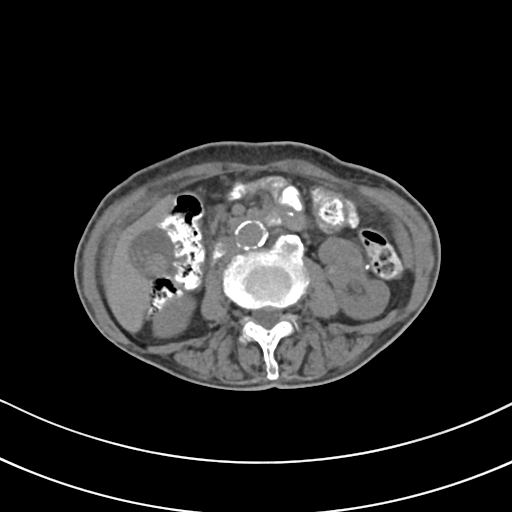
[im 58/116  soft-tissue]
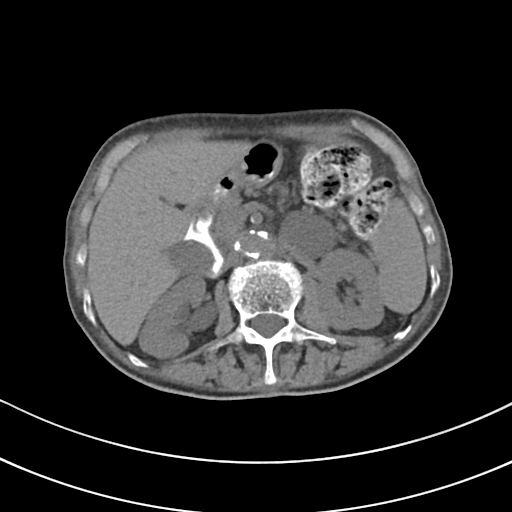
[im 66/116  soft-tissue]
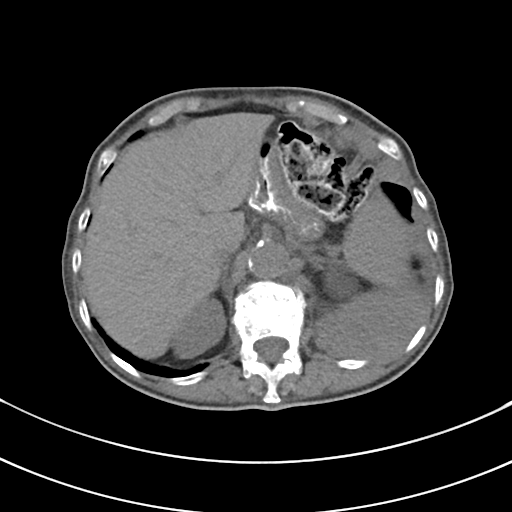
[im 83/116  soft-tissue]
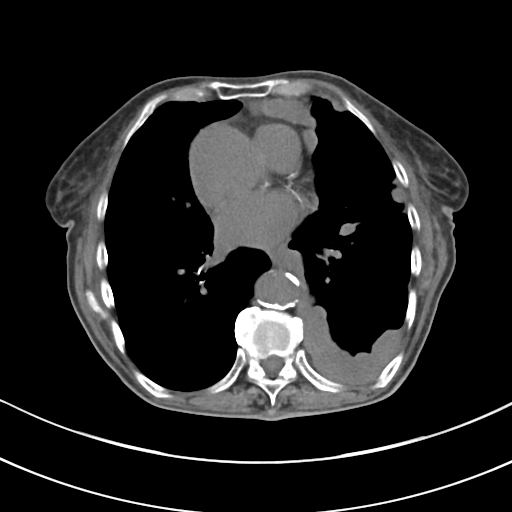
[im 91/116  soft-tissue]
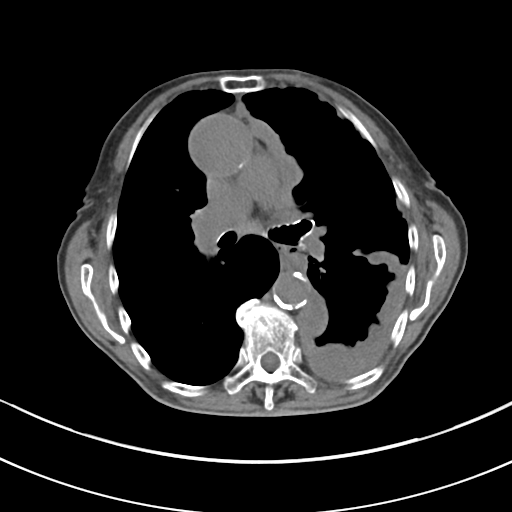
[im 107/116  soft-tissue]
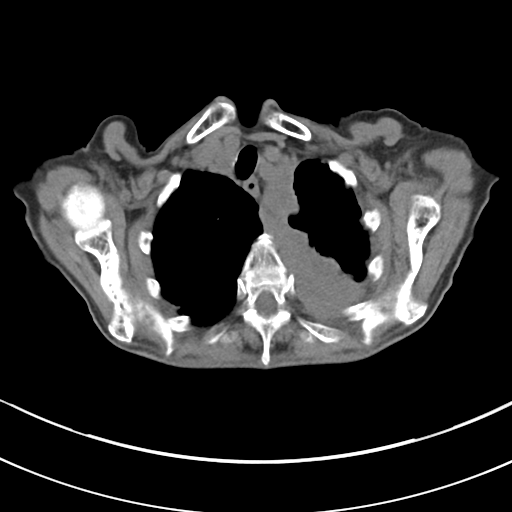
[im 107/116  bone]
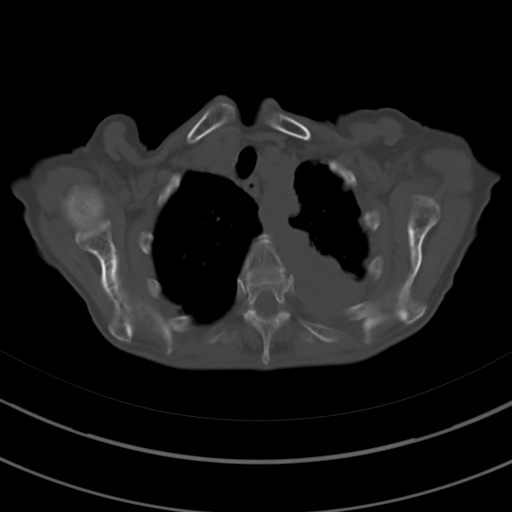

[Series 5: coronal · coronal · 0.67mm/px · 3 of 77 slices shown]
[im 26/77  soft-tissue]
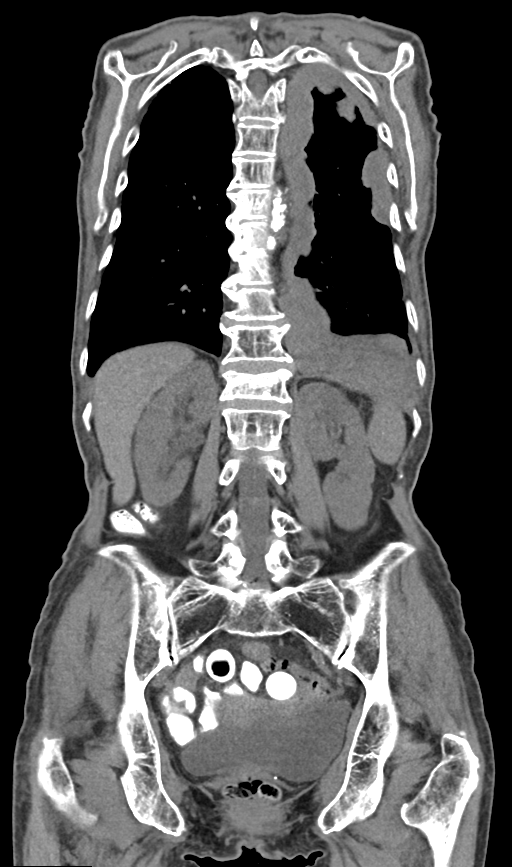
[im 34/77  soft-tissue]
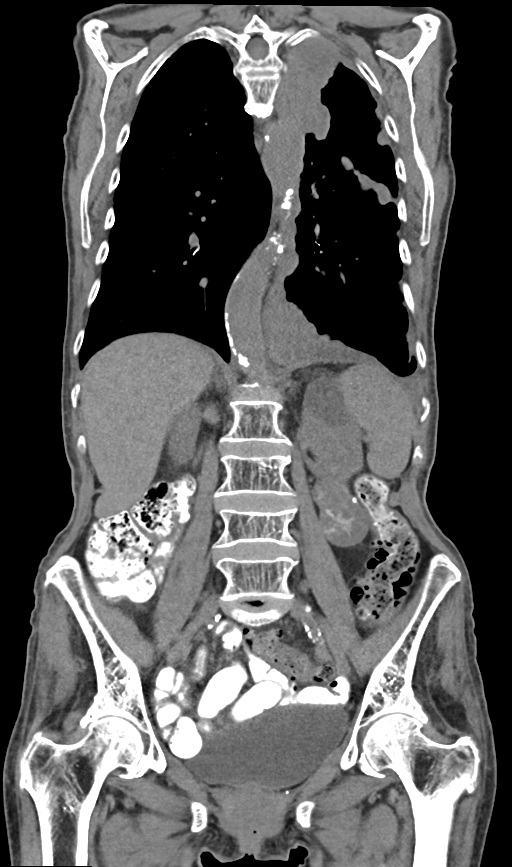
[im 43/77  soft-tissue]
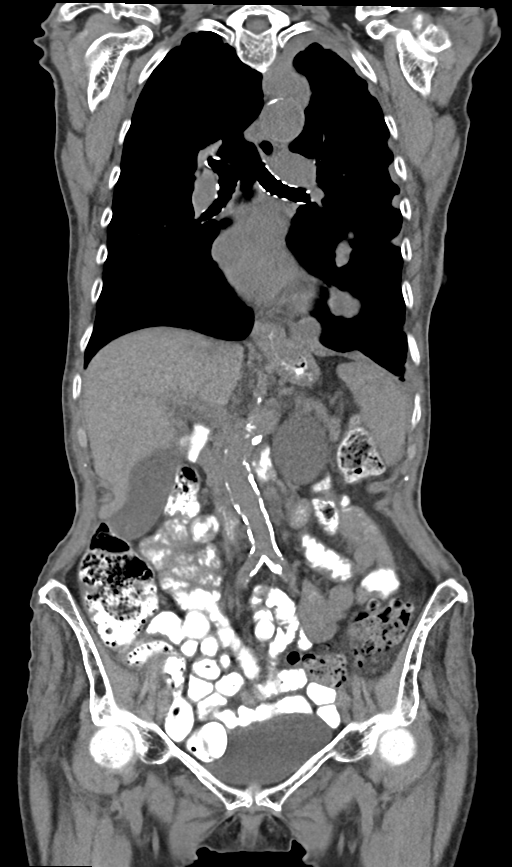

[12 of 46 positions shown; findings below may reference images not displayed]

FINDINGS: CT CHEST FINDINGS

Cardiovascular: Heart size is normal. There is no significant
pericardial fluid, thickening or pericardial calcification. There is
aortic atherosclerosis, as well as atherosclerosis of the great
vessels of the mediastinum and the coronary arteries, including
calcified atherosclerotic plaque in the left main, left anterior
descending, left circumflex and right coronary arteries. Ectasia of
ascending thoracic aorta (4.3 cm in diameter).

Mediastinum/Nodes: Bulky soft tissue in the AP window nodal station
strongly favored to represent metastatic pleural disease, although
the possibility of AP window lymphadenopathy is not excluded,
measuring 1.9 cm in short axis. No other definite pathologically
enlarged mediastinal lymph nodes are confidently identified. No
definite hilar lymphadenopathy confidently identified on today's
noncontrast CT examination. Esophagus is unremarkable in appearance.
No axillary lymphadenopathy.

Lungs/Pleura: Partially calcified nodule near the apex of the right
upper lobe, similar to the prior study, favored to represent some
nodular scarring. No other definite suspicious appearing pulmonary
nodules or masses are noted. However, there continues to be
extensive nodular and mass-like pleural thickening throughout the
left hemithorax which is grossly unchanged compared to the prior
examination, indicative of widespread metastatic disease to the
pleura. This thick and irregular pleural rind measures up to 2.7 cm
superiorly (axial image 11 of series 2), unchanged, and up to 3.5 cm
inferiorly (axial image 44 of series 2), also essentially unchanged.

Musculoskeletal: There are no aggressive appearing lytic or blastic
lesions noted in the visualized portions of the skeleton.

CT ABDOMEN PELVIS FINDINGS

Hepatobiliary: No definite suspicious cystic or solid hepatic
lesions are confidently identified on today's noncontrast CT
examination. Multiple partially peripherally calcified gallstones
are noted within the lumen of the gallbladder measuring up to
cm. Gallbladder is not distended. No pericholecystic fluid or
surrounding inflammatory changes.

Pancreas: Pancreatic atrophy. No pancreatic mass or peripancreatic
fluid collections or inflammatory changes are confidently identified
on today's noncontrast CT examination.

Spleen: Unremarkable.

Adrenals/Urinary Tract: Exophytic partially calcified lesion
extending from the lower pole of the left kidney measuring 3.4 x
x 4.6 cm (axial image 71 of series 2 and coronal image 35 of series
5), similar to the prior examination. Other low-attenuation lesions
in the left kidney, incompletely characterized on today's
non-contrast CT examination, but similar to the prior study and
statistically likely to represent cysts, largest of which extends
exophytically from the medial aspect of the interpolar region
measuring up to 4.7 x 2.9 cm.

Stomach/Bowel: Unenhanced appearance of the stomach is normal. There
is no pathologic dilatation of small bowel or colon. A few scattered
colonic diverticulae are noted, without surrounding inflammatory
changes to suggest an acute diverticulitis at this time. The
appendix is not confidently identified and may be surgically absent.
Regardless, there are no inflammatory changes noted adjacent to the
cecum to suggest the presence of an acute appendicitis at this time.

Vascular/Lymphatic: Aortic atherosclerosis. No lymphadenopathy noted
in the abdomen or pelvis.

Reproductive: Uterus and ovaries are unremarkable in appearance.

Other: No significant volume of ascites.  No pneumoperitoneum.

Musculoskeletal: Chronic compression fracture of L1 with 40% loss of
anterior vertebral body height. There are no aggressive appearing
lytic or blastic lesions noted in the visualized portions of the
skeleton.
IMPRESSION: 1. Today's examination appears essentially stable. Specifically, the
complex lesion in the lower pole of the left kidney is essentially
unchanged, as is widespread metastatic disease to the pleura of the
left hemithorax.
2. Aortic atherosclerosis, in addition to left main and three-vessel
coronary artery disease. There is also ectasia of ascending thoracic
aorta (4.3 cm in diameter). Recommend annual imaging followup by CTA
or MRA. This recommendation follows 8151
ACCF/AHA/AATS/ACR/ASA/SCA/BARNEY/ABLATIF/MOATSHE/PUPO Guidelines for the
Diagnosis and Management of Patients with Thoracic Aortic Disease.
Circulation. 8151; 121: [id number]. Aortic aneurysm NOS
([id number]).
3. Colonic diverticulosis without evidence of acute diverticulitis
at this time.
4. Cholelithiasis without evidence of acute cholecystitis.
